# Patient Record
Sex: Female | Born: 1985 | Race: Black or African American | Hispanic: No | Marital: Married | Smoking: Never smoker
Health system: Southern US, Community
[De-identification: ages and names within clinical notes are randomized; demographics above are authoritative.]

## PROBLEM LIST (undated history)

## (undated) ENCOUNTER — Inpatient Hospital Stay (HOSPITAL_COMMUNITY): Payer: Self-pay

## (undated) DIAGNOSIS — Z8759 Personal history of other complications of pregnancy, childbirth and the puerperium: Secondary | ICD-10-CM

## (undated) DIAGNOSIS — O139 Gestational [pregnancy-induced] hypertension without significant proteinuria, unspecified trimester: Secondary | ICD-10-CM

## (undated) DIAGNOSIS — B977 Papillomavirus as the cause of diseases classified elsewhere: Secondary | ICD-10-CM

## (undated) DIAGNOSIS — C801 Malignant (primary) neoplasm, unspecified: Secondary | ICD-10-CM

## (undated) DIAGNOSIS — R1110 Vomiting, unspecified: Secondary | ICD-10-CM

## (undated) DIAGNOSIS — IMO0002 Reserved for concepts with insufficient information to code with codable children: Secondary | ICD-10-CM

## (undated) DIAGNOSIS — K117 Disturbances of salivary secretion: Secondary | ICD-10-CM

## (undated) DIAGNOSIS — R87629 Unspecified abnormal cytological findings in specimens from vagina: Secondary | ICD-10-CM

## (undated) DIAGNOSIS — E876 Hypokalemia: Secondary | ICD-10-CM

## (undated) DIAGNOSIS — A749 Chlamydial infection, unspecified: Secondary | ICD-10-CM

## (undated) HISTORY — DX: Disturbances of salivary secretion: K11.7

## (undated) HISTORY — DX: Vomiting, unspecified: R11.10

## (undated) HISTORY — DX: Hypokalemia: E87.6

## (undated) HISTORY — PX: WISDOM TOOTH EXTRACTION: SHX21

## (undated) HISTORY — DX: Malignant (primary) neoplasm, unspecified: C80.1

## (undated) HISTORY — DX: Reserved for concepts with insufficient information to code with codable children: IMO0002

## (undated) HISTORY — PX: COLPOSCOPY: SHX161

## (undated) HISTORY — DX: Personal history of other complications of pregnancy, childbirth and the puerperium: Z87.59

---
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
## 2008-02-13 ENCOUNTER — Emergency Department (HOSPITAL_COMMUNITY): Admission: EM | Admit: 2008-02-13 | Discharge: 2008-02-13 | Payer: Self-pay | Admitting: Emergency Medicine

## 2009-03-06 ENCOUNTER — Emergency Department (HOSPITAL_COMMUNITY): Admission: EM | Admit: 2009-03-06 | Discharge: 2009-03-07 | Payer: Self-pay | Admitting: Emergency Medicine
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
## 2009-12-20 ENCOUNTER — Inpatient Hospital Stay (HOSPITAL_COMMUNITY)
Admission: EM | Admit: 2009-12-20 | Discharge: 2009-12-20 | Payer: Self-pay | Source: Home / Self Care | Admitting: Obstetrics & Gynecology

## 2009-12-20 IMAGING — US US OB COMP LESS 14 WK
1 series · 13 of 28 positions shown · non-contrast
Comparison: none

[Series 1: us ob comp less 14 wks · 0.27mm/px · 41 acquisitions, 13 frames shown]
[im 2/41]
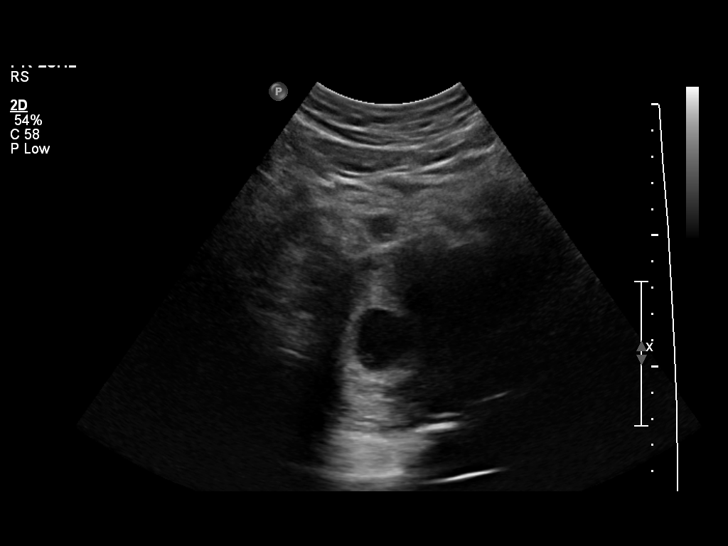
[im 5/41]
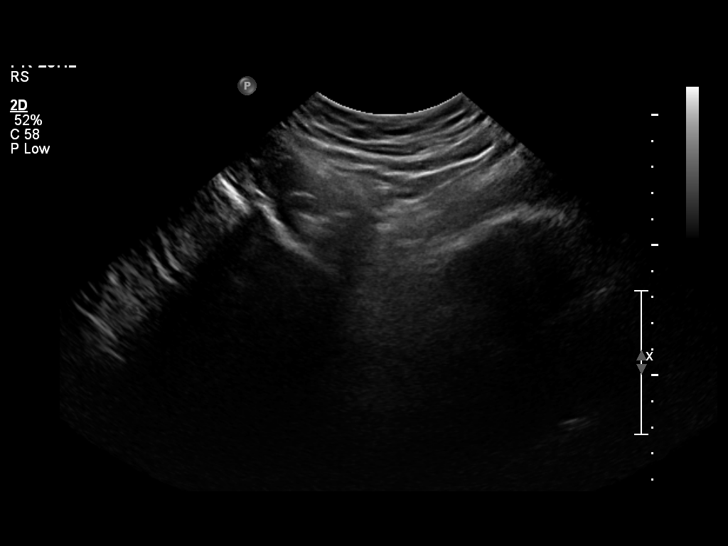
[im 8/41]
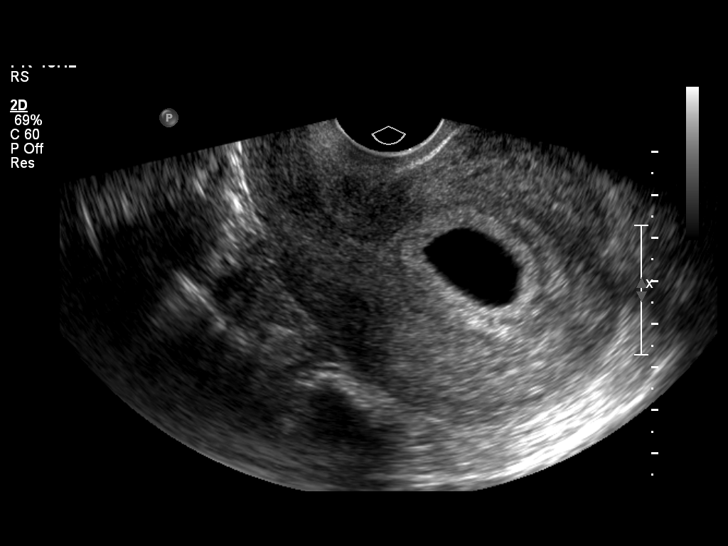
[im 11/41]
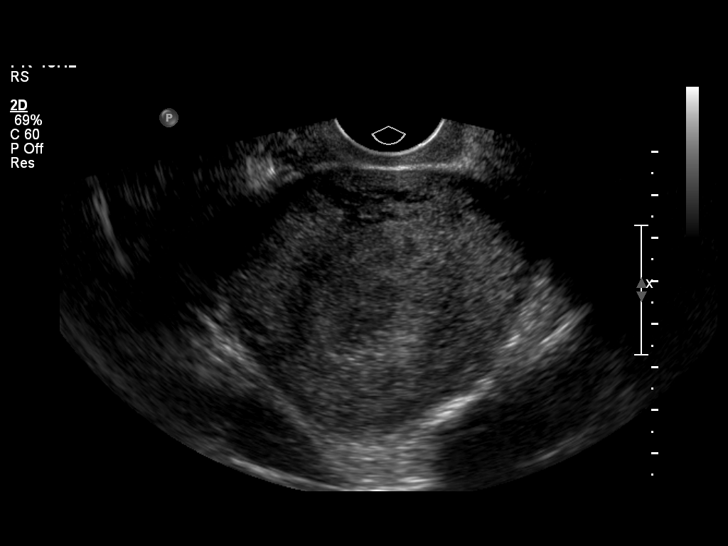
[im 14/41]
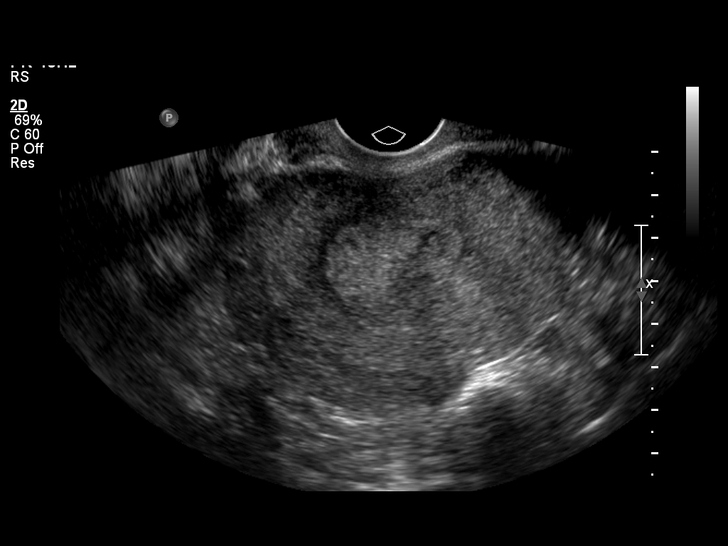
[im 17/41]
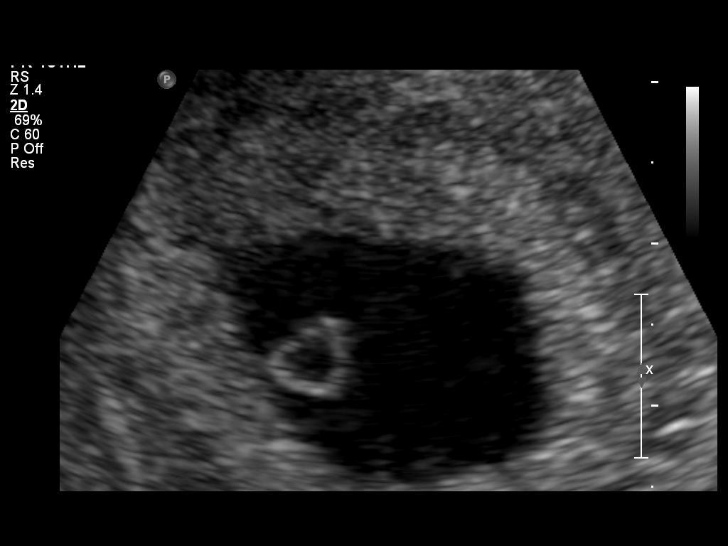
[im 21/41]
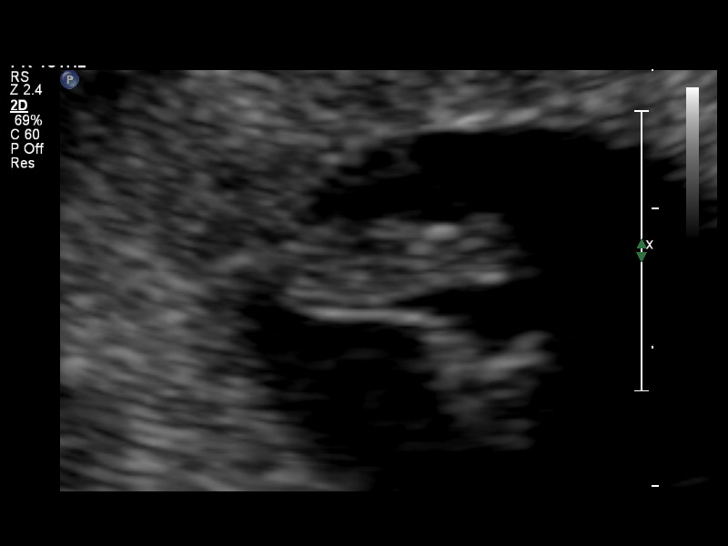
[im 24/41]
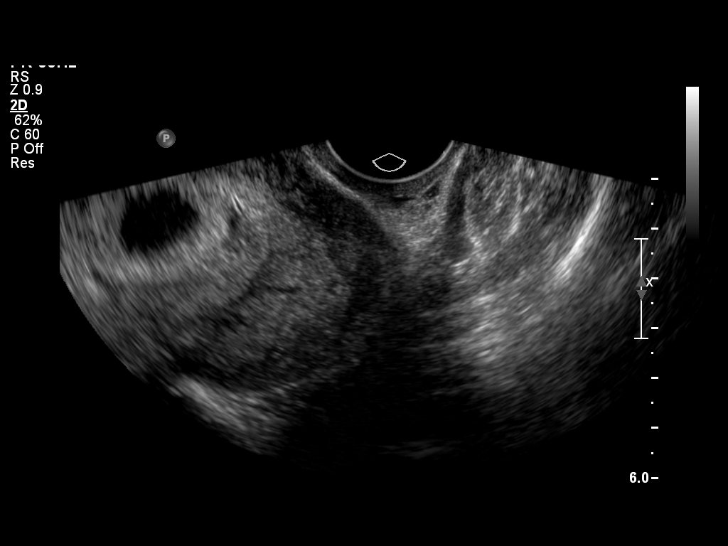
[im 27/41]
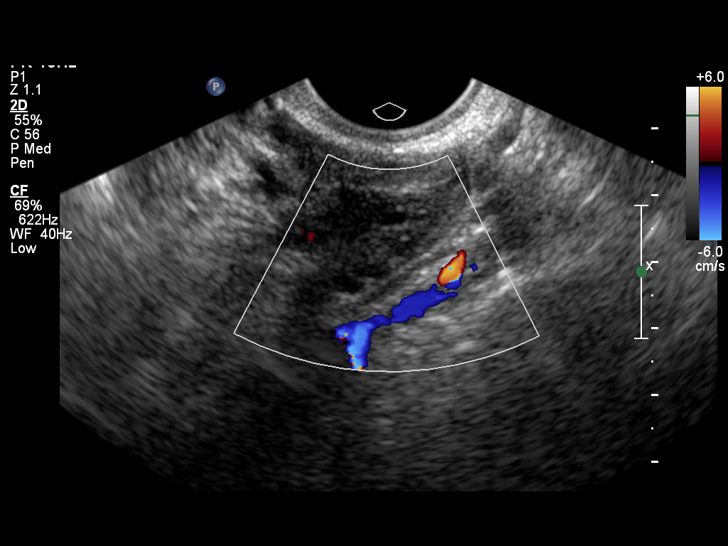
[im 30/41]
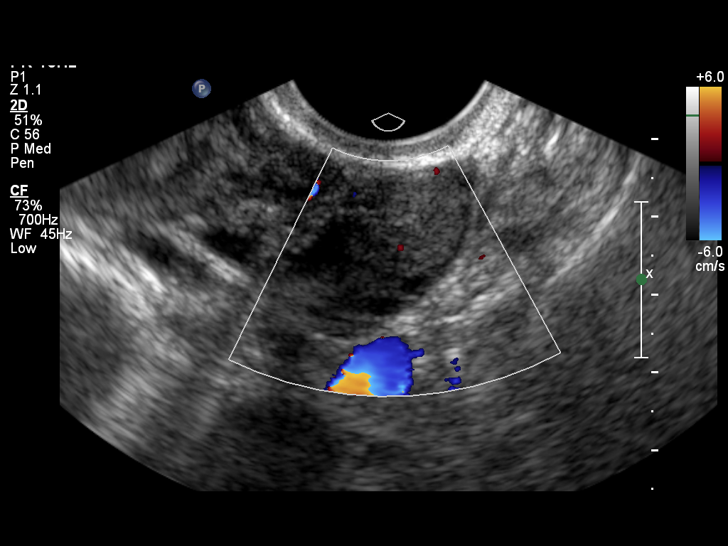
[im 33/41]
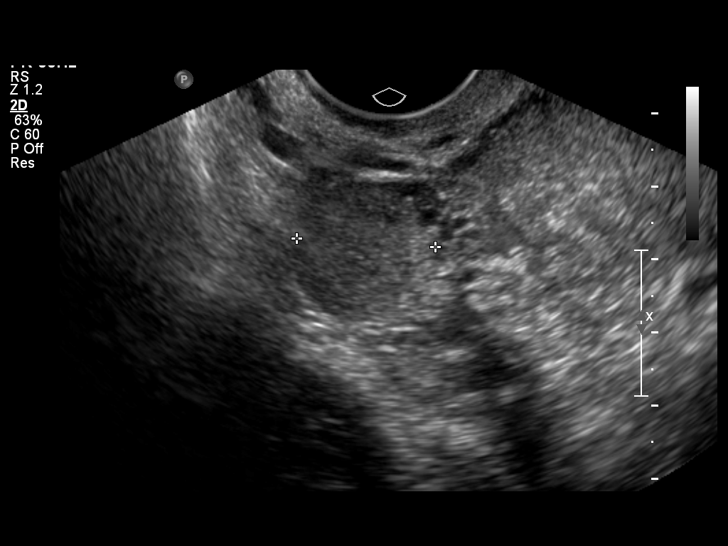
[im 36/41]
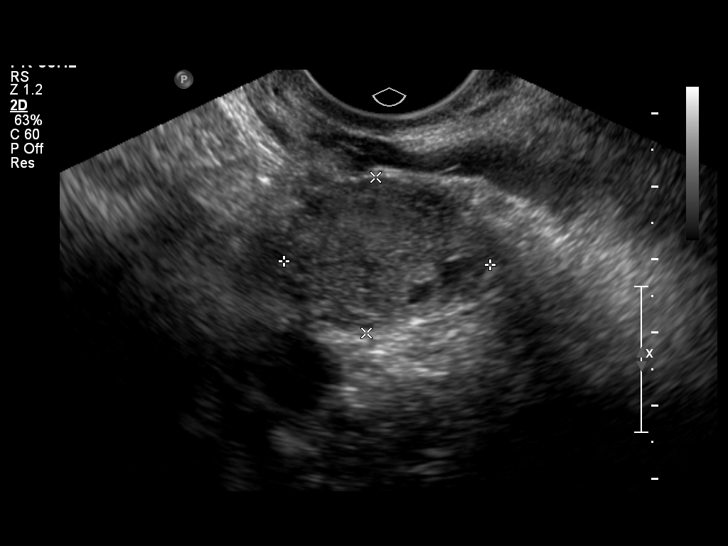
[im 39/41]
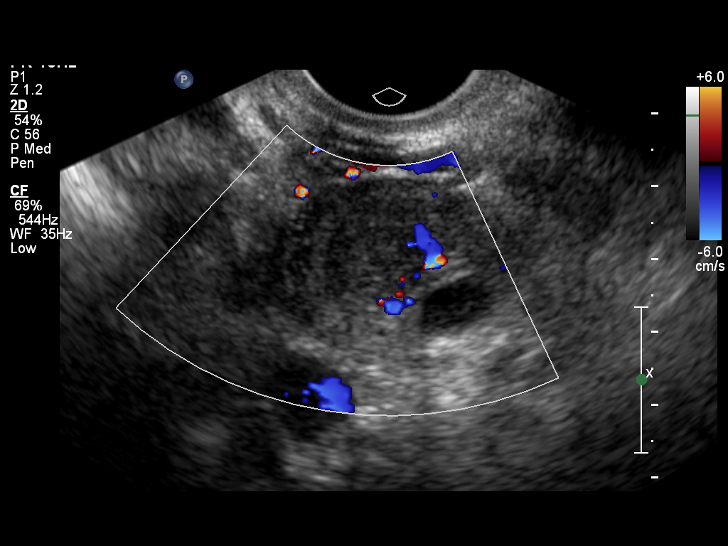

[13 of 28 positions shown; findings below may reference images not displayed]

OBSTETRICS REPORT
                      (Signed Final [DATE] [DATE])

 Order#:         [PHONE_NUMBER][id number]_[email or web address],[P9]
                 [P9]_E
Procedures

 US OB COMP LESS 14 WKS                                76801.0
 US OB TRANSVAGINAL MODIFY                             76817.1
Indications

 Hyperemesis gravidarum
Fetal Evaluation

 Preg. Location:    Intrauterine
 Gest. Sac:         Intrauterine
 [place] Sac:          Visualized
 Fetal Pole:        Visualized
 Fetal Heart Rate:  131                          bpm
 Cardiac Activity:  Observed

 Comment:    Small subchorionic hemorrhage noted.
Biometry

 CRL:      9.4  mm     G. Age:  7w 0d                  EDD:    [DATE]
Gestational Age

 LMP:           6w 4d         Date:  [DATE]                 EDD:   [DATE]
 Best:          6w 4d      Det. By:  LMP  ([DATE])          EDD:   [DATE]
Cervix Uterus Adnexa

 Cervix:       Normal appearance by transvaginal scan
 Uterus:       Retroverted.
 Cul De Sac:   No free fluid seen.
 Left Ovary:    Within normal limits.
 Right Ovary:   Within normal limits. Small corpus luteum noted.
 Adnexa:     No abnormality visualized.
Impression

 Single living IUP.  US EGA is concordant with LMP.
 Small subchorionic hemorrhage noted.
 No adnexal mass or free fluid identified.
 questions or concerns.

## 2009-12-23 ENCOUNTER — Inpatient Hospital Stay (HOSPITAL_COMMUNITY): Admission: AD | Admit: 2009-12-23 | Discharge: 2009-12-23 | Payer: Self-pay | Admitting: Obstetrics and Gynecology

## 2009-12-28 ENCOUNTER — Inpatient Hospital Stay (HOSPITAL_COMMUNITY)
Admission: AD | Admit: 2009-12-28 | Discharge: 2010-01-04 | Payer: Self-pay | Source: Home / Self Care | Attending: Family Medicine | Admitting: Family Medicine

## 2009-12-29 IMAGING — US US ABDOMEN COMPLETE
1 series · 14 of 25 positions shown · non-contrast
Comparison: None.

CLINICAL DATA: 7 weeks pregnant, hyper emesis, abnormal liver
function tests

ABDOMINAL ULTRASOUND COMPLETE

[Series 1: us abdomen complete · 14 of 79 slices shown]
[im 1/79]
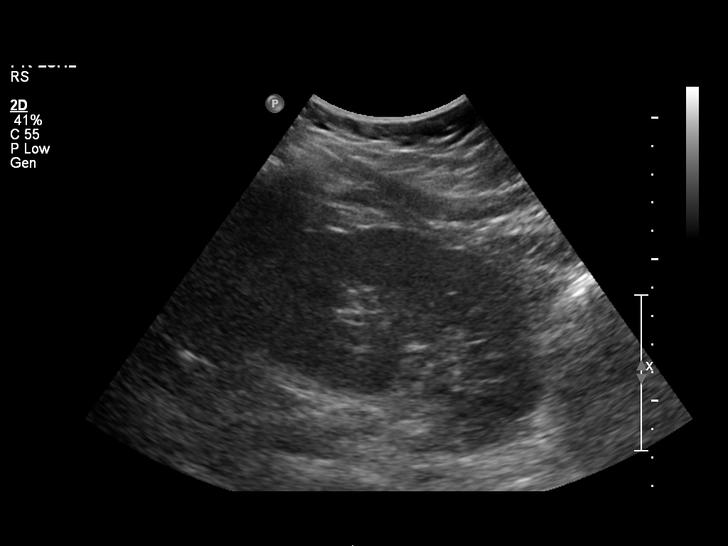
[im 7/79]
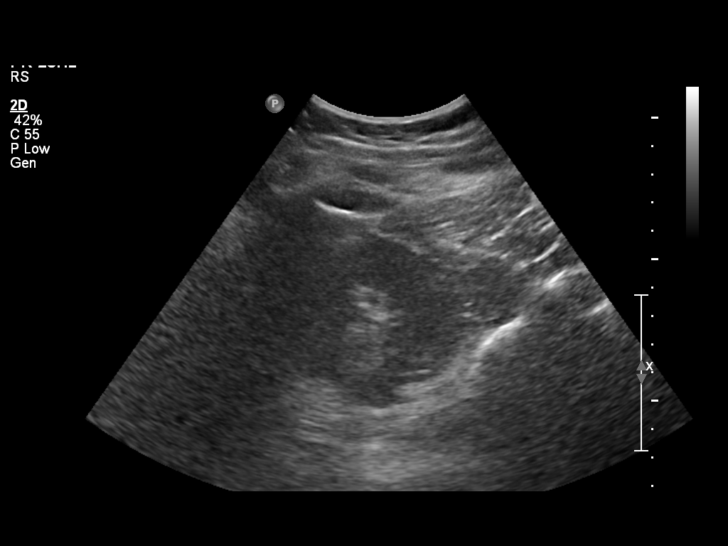
[im 14/79]
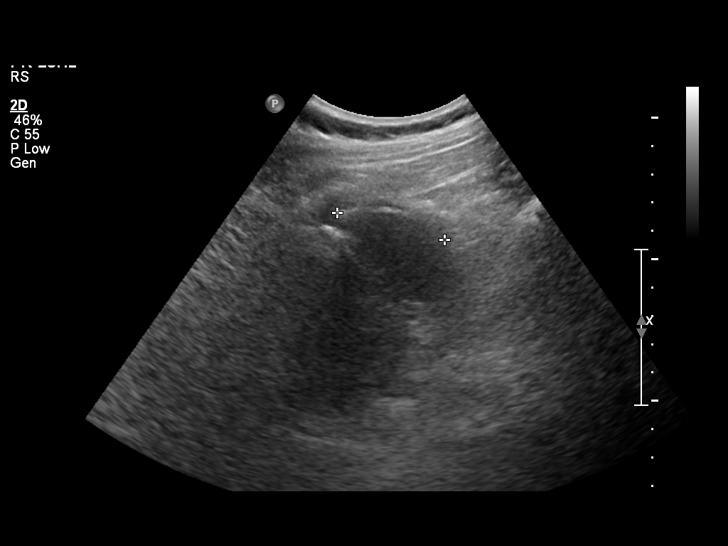
[im 20/79]
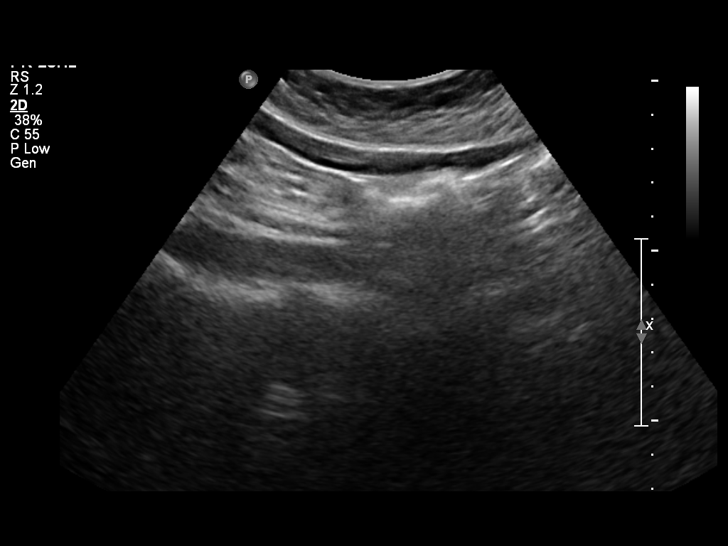
[im 27/79]
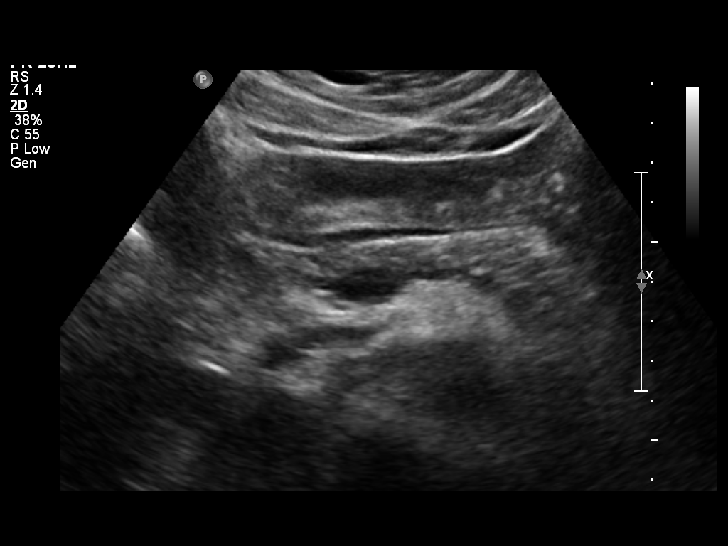
[im 30/79]
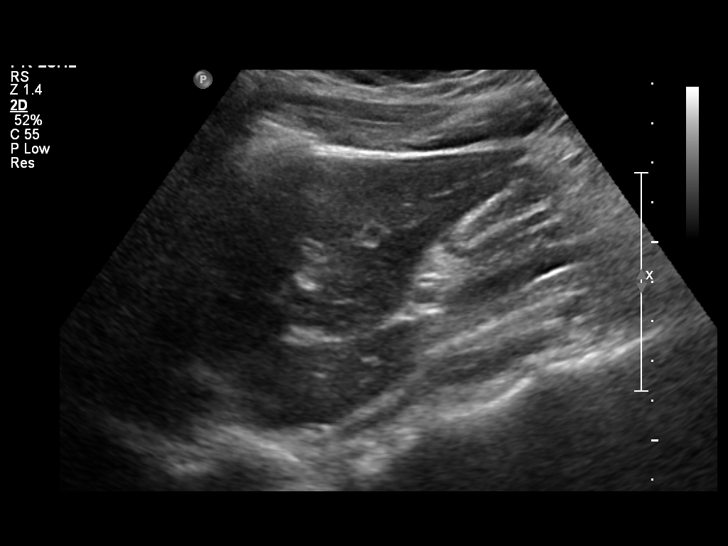
[im 36/79]
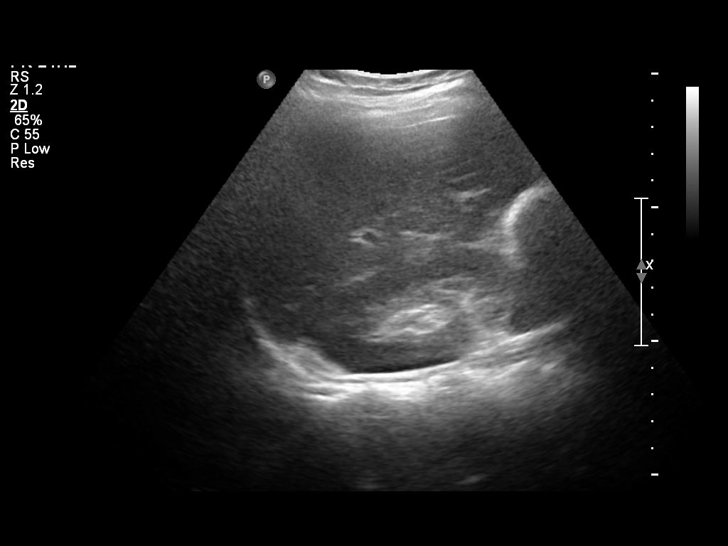
[im 43/79]
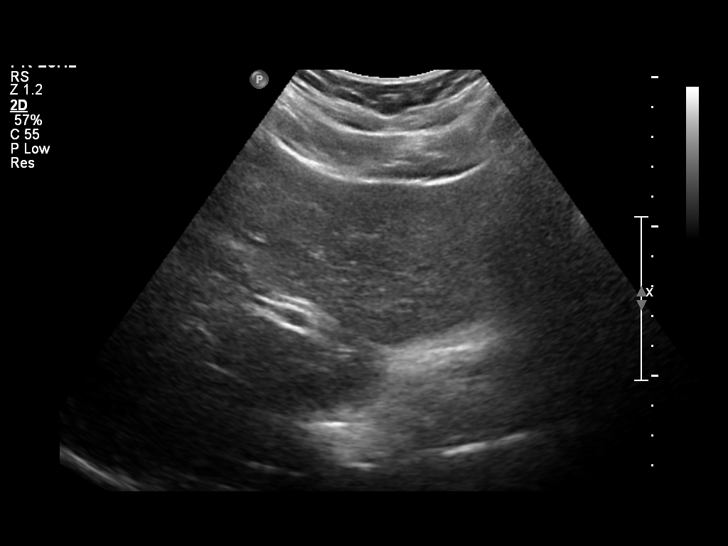
[im 49/79]
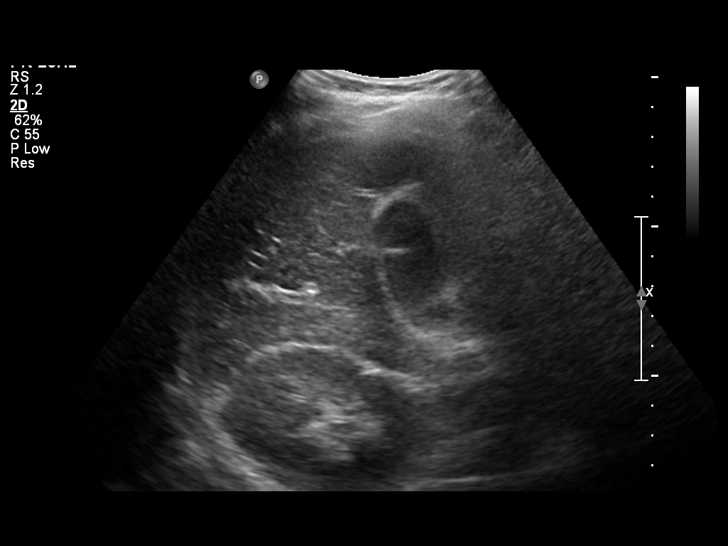
[im 53/79]
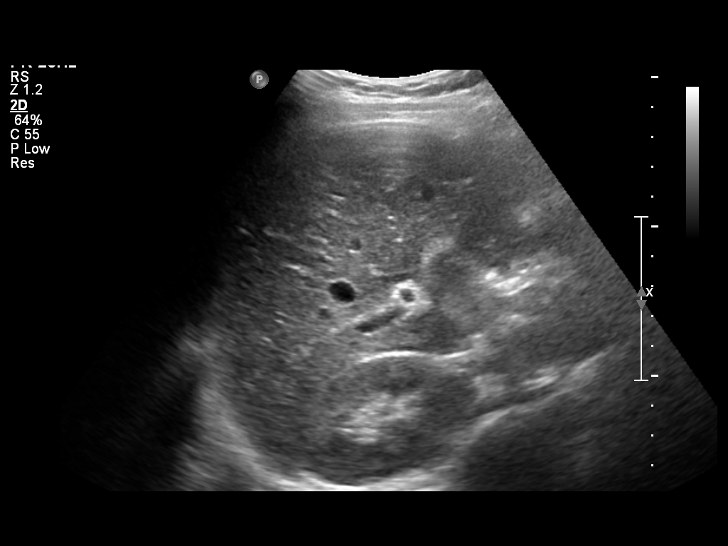
[im 59/79]
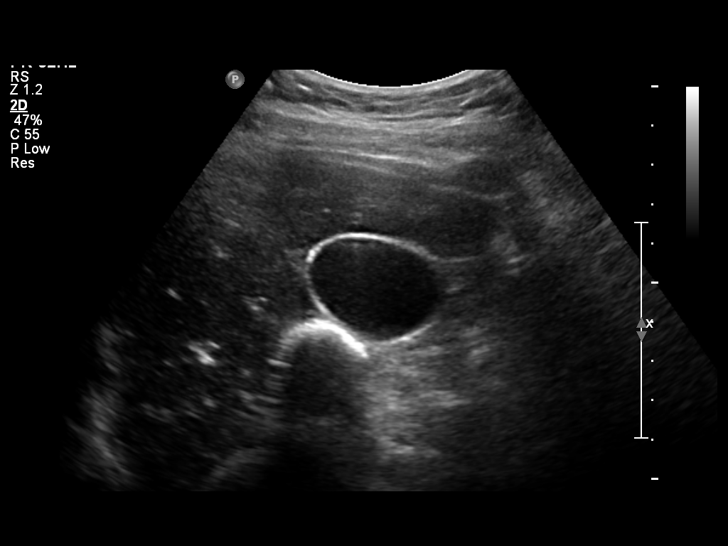
[im 66/79]
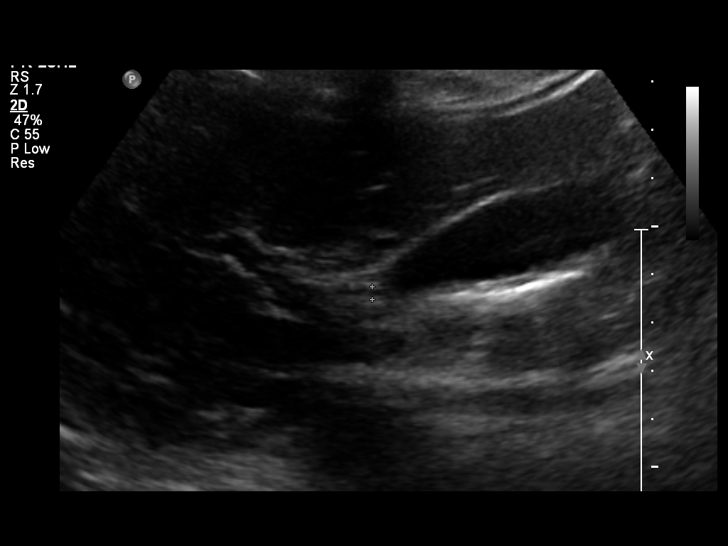
[im 72/79]
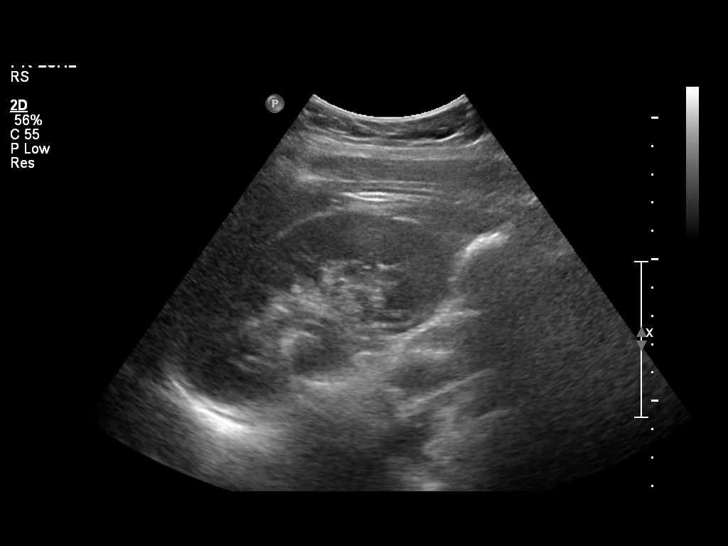
[im 79/79]
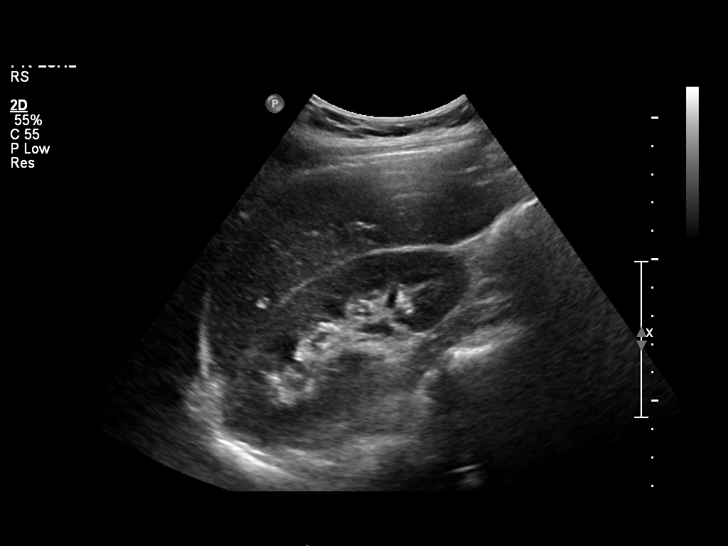

[14 of 25 positions shown; findings below may reference images not displayed]

FINDINGS: Gallbladder:  No gallstones, gallbladder wall thickening, or
pericholecystic fluid.

Common Bile Duct:  Within normal limits in caliber.

Liver: No focal mass lesion identified.  Within normal limits in
parenchymal echogenicity.

IVC:  Appears normal.

Pancreas:  No abnormality identified.

Spleen:  Within normal limits in size and echotexture.

Right kidney:  Normal in size and parenchymal echogenicity.  No
evidence of mass or hydronephrosis.

Left kidney:  Normal in size and parenchymal echogenicity.  No
evidence of mass or hydronephrosis.

Abdominal Aorta:  No aneurysm identified.
IMPRESSION: Negative abdominal ultrasound.

## 2010-01-23 DIAGNOSIS — IMO0002 Reserved for concepts with insufficient information to code with codable children: Secondary | ICD-10-CM

## 2010-01-23 DIAGNOSIS — R87619 Unspecified abnormal cytological findings in specimens from cervix uteri: Secondary | ICD-10-CM

## 2010-01-23 HISTORY — DX: Unspecified abnormal cytological findings in specimens from cervix uteri: R87.619

## 2010-01-23 HISTORY — DX: Reserved for concepts with insufficient information to code with codable children: IMO0002

## 2010-01-28 ENCOUNTER — Inpatient Hospital Stay (HOSPITAL_COMMUNITY)
Admission: AD | Admit: 2010-01-28 | Discharge: 2010-02-07 | Payer: Self-pay | Source: Home / Self Care | Attending: Family Medicine | Admitting: Family Medicine

## 2010-02-07 LAB — COMPREHENSIVE METABOLIC PANEL
ALT: 56 U/L — ABNORMAL HIGH (ref 0–35)
ALT: 52 U/L — ABNORMAL HIGH (ref 0–35)
ALT: 58 U/L — ABNORMAL HIGH (ref 0–35)
AST: 35 U/L (ref 0–37)
AST: 33 U/L (ref 0–37)
AST: 32 U/L (ref 0–37)
Albumin: 3.1 g/dL — ABNORMAL LOW (ref 3.5–5.2)
Albumin: 2.9 g/dL — ABNORMAL LOW (ref 3.5–5.2)
Albumin: 3 g/dL — ABNORMAL LOW (ref 3.5–5.2)
Alkaline Phosphatase: 95 U/L (ref 39–117)
Alkaline Phosphatase: 91 U/L (ref 39–117)
Alkaline Phosphatase: 93 U/L (ref 39–117)
BUN: 1 mg/dL — ABNORMAL LOW (ref 6–23)
BUN: 1 mg/dL — ABNORMAL LOW (ref 6–23)
BUN: <1 mg/dL — ABNORMAL LOW (ref 6–23)
CO2: 26 mEq/L (ref 19–32)
CO2: 25 mEq/L (ref 19–32)
CO2: 25 mEq/L (ref 19–32)
Calcium: 9.2 mg/dL (ref 8.4–10.5)
Calcium: 8.9 mg/dL (ref 8.4–10.5)
Calcium: 9.1 mg/dL (ref 8.4–10.5)
Chloride: 100 mEq/L (ref 96–112)
Chloride: 102 mEq/L (ref 96–112)
Chloride: 102 mEq/L (ref 96–112)
Creatinine, Ser: 0.58 mg/dL (ref 0.4–1.2)
Creatinine, Ser: 0.53 mg/dL (ref 0.4–1.2)
Creatinine, Ser: 0.47 mg/dL (ref 0.4–1.2)
GFR calc Af Amer: >60 mL/min (ref >60)
GFR calc Af Amer: >60 mL/min (ref >60)
GFR calc Af Amer: >60 mL/min (ref >60)
GFR calc non Af Amer: >60 mL/min (ref >60)
GFR calc non Af Amer: >60 mL/min (ref >60)
GFR calc non Af Amer: >60 mL/min (ref >60)
Glucose, Bld: 107 mg/dL — ABNORMAL HIGH (ref 70–99)
Glucose, Bld: 94 mg/dL (ref 70–99)
Glucose, Bld: 86 mg/dL (ref 70–99)
Potassium: 2.9 mEq/L — ABNORMAL LOW (ref 3.5–5.1)
Potassium: 2.9 mEq/L — ABNORMAL LOW (ref 3.5–5.1)
Potassium: 3.7 mEq/L (ref 3.5–5.1)
Sodium: 134 mEq/L — ABNORMAL LOW (ref 135–145)
Sodium: 134 mEq/L — ABNORMAL LOW (ref 135–145)
Sodium: 133 mEq/L — ABNORMAL LOW (ref 135–145)
Total Bilirubin: 1.5 mg/dL — ABNORMAL HIGH (ref 0.3–1.2)
Total Bilirubin: 1.5 mg/dL — ABNORMAL HIGH (ref 0.3–1.2)
Total Bilirubin: 1.1 mg/dL (ref 0.3–1.2)
Total Protein: 6.4 g/dL (ref 6.0–8.3)
Total Protein: 6 g/dL (ref 6.0–8.3)
Total Protein: 6.2 g/dL (ref 6.0–8.3)

## 2010-02-07 LAB — URINE CULTURE
Colony Count: NO GROWTH
Culture  Setup Time: 201201072033
Culture: NO GROWTH
Special Requests: NEGATIVE

## 2010-02-07 LAB — TSH: TSH: 0.652 u[IU]/mL (ref 0.350–4.500)

## 2010-02-07 LAB — CBC
HCT: 32.4 % — ABNORMAL LOW (ref 36.0–46.0)
HCT: 32.2 % — ABNORMAL LOW (ref 36.0–46.0)
Hemoglobin: 11 g/dL — ABNORMAL LOW (ref 12.0–15.0)
Hemoglobin: 10.8 g/dL — ABNORMAL LOW (ref 12.0–15.0)
MCH: 25.1 pg — ABNORMAL LOW (ref 26.0–34.0)
MCH: 25.1 pg — ABNORMAL LOW (ref 26.0–34.0)
MCHC: 34 g/dL (ref 30.0–36.0)
MCHC: 33.5 g/dL (ref 30.0–36.0)
MCV: 74 fL — ABNORMAL LOW (ref 78.0–100.0)
MCV: 74.9 fL — ABNORMAL LOW (ref 78.0–100.0)
Platelets: 326 10*3/uL (ref 150–400)
Platelets: 342 10*3/uL (ref 150–400)
RBC: 4.38 MIL/uL (ref 3.87–5.11)
RBC: 4.3 MIL/uL (ref 3.87–5.11)
RDW: 16.7 % — ABNORMAL HIGH (ref 11.5–15.5)
RDW: 16.8 % — ABNORMAL HIGH (ref 11.5–15.5)
WBC: 7.7 10*3/uL (ref 4.0–10.5)
WBC: 8.3 10*3/uL (ref 4.0–10.5)

## 2010-02-07 LAB — GLUCOSE, CAPILLARY
Glucose-Capillary: 120 mg/dL — ABNORMAL HIGH (ref 70–99)
Glucose-Capillary: 116 mg/dL — ABNORMAL HIGH (ref 70–99)
Glucose-Capillary: 108 mg/dL — ABNORMAL HIGH (ref 70–99)
Glucose-Capillary: 137 mg/dL — ABNORMAL HIGH (ref 70–99)
Glucose-Capillary: 125 mg/dL — ABNORMAL HIGH (ref 70–99)
Glucose-Capillary: 123 mg/dL — ABNORMAL HIGH (ref 70–99)
Glucose-Capillary: 106 mg/dL — ABNORMAL HIGH (ref 70–99)
Glucose-Capillary: 110 mg/dL — ABNORMAL HIGH (ref 70–99)
Glucose-Capillary: 101 mg/dL — ABNORMAL HIGH (ref 70–99)

## 2010-02-07 LAB — LIPASE, BLOOD
Lipase: 25 U/L (ref 11–59)
Lipase: 29 U/L (ref 11–59)

## 2010-02-07 LAB — URINALYSIS, ROUTINE W REFLEX MICROSCOPIC
Hgb urine dipstick: NEGATIVE
Ketones, ur: >80 mg/dL — AB
Leukocytes, UA: NEGATIVE
Nitrite: NEGATIVE
Protein, ur: 100 mg/dL — AB
Specific Gravity, Urine: >1.03 — ABNORMAL HIGH (ref 1.005–1.030)
Urine Glucose, Fasting: 250 mg/dL — AB
Urobilinogen, UA: 4 mg/dL — ABNORMAL HIGH (ref 0.0–1.0)
pH: 6.5 (ref 5.0–8.0)

## 2010-02-07 LAB — URINE MICROSCOPIC-ADD ON

## 2010-02-07 LAB — AMYLASE
Amylase: 78 U/L (ref 0–105)
Amylase: 76 U/L (ref 0–105)

## 2010-02-07 LAB — HEMOGLOBIN A1C
Hgb A1c MFr Bld: 5.4 % (ref <5.7)
Mean Plasma Glucose: 108 mg/dL (ref <117)

## 2010-02-09 LAB — GLUCOSE, CAPILLARY: Glucose-Capillary: 107 mg/dL — ABNORMAL HIGH (ref 70–99)

## 2010-02-10 ENCOUNTER — Ambulatory Visit
Admission: RE | Admit: 2010-02-10 | Discharge: 2010-02-10 | Payer: Self-pay | Source: Home / Self Care | Attending: Family Medicine | Admitting: Family Medicine

## 2010-02-10 ENCOUNTER — Encounter: Payer: Self-pay | Admitting: Obstetrics & Gynecology

## 2010-02-10 ENCOUNTER — Other Ambulatory Visit: Payer: Self-pay | Admitting: Obstetrics & Gynecology

## 2010-02-10 LAB — CONVERTED CEMR LAB
Antibody Screen: NEGATIVE
Basophils Absolute: 0 10*3/uL (ref 0.0–0.1)
Basophils Relative: 0 % (ref 0–1)
Eosinophils Relative: 0 % (ref 0–5)
HCT: 36.9 % (ref 36.0–46.0)
HIV: NONREACTIVE
Hemoglobin: 11.8 g/dL — ABNORMAL LOW (ref 12.0–15.0)
Hgb A: 97.6 % (ref 96.8–97.8)
Hgb F Quant: 0 % (ref 0.0–2.0)
Hgb S Quant: 0 % (ref 0.0–0.0)
MCHC: 32 g/dL (ref 30.0–36.0)
MCV: 77.8 fL — ABNORMAL LOW (ref 78.0–100.0)
Monocytes Absolute: 1.2 10*3/uL — ABNORMAL HIGH (ref 0.1–1.0)
Monocytes Relative: 9 % (ref 3–12)
RBC: 4.74 M/uL (ref 3.87–5.11)
RDW: 17.4 % — ABNORMAL HIGH (ref 11.5–15.5)

## 2010-02-11 ENCOUNTER — Ambulatory Visit
Admission: RE | Admit: 2010-02-11 | Payer: Self-pay | Source: Home / Self Care | Attending: Obstetrics and Gynecology | Admitting: Obstetrics and Gynecology

## 2010-02-11 ENCOUNTER — Encounter: Payer: Self-pay | Admitting: Obstetrics & Gynecology

## 2010-02-11 LAB — CONVERTED CEMR LAB
ALT: 31 units/L (ref 0–35)
AST: 18 units/L (ref 0–37)
Alkaline Phosphatase: 98 units/L (ref 39–117)
Chloride: 93 meq/L — ABNORMAL LOW (ref 96–112)
Creatinine, Ser: 0.5 mg/dL (ref 0.40–1.20)
Total Bilirubin: 0.5 mg/dL (ref 0.3–1.2)

## 2010-02-12 ENCOUNTER — Inpatient Hospital Stay (HOSPITAL_COMMUNITY)
Admission: AD | Admit: 2010-02-12 | Discharge: 2010-02-12 | Payer: Self-pay | Source: Home / Self Care | Attending: Obstetrics & Gynecology | Admitting: Obstetrics & Gynecology

## 2010-02-12 DIAGNOSIS — O210 Mild hyperemesis gravidarum: Secondary | ICD-10-CM

## 2010-02-14 ENCOUNTER — Inpatient Hospital Stay (HOSPITAL_COMMUNITY)
Admission: AD | Admit: 2010-02-14 | Discharge: 2010-02-22 | Disposition: A | Discharge status: Still In Hospital | Payer: Self-pay | Source: Home / Self Care | Attending: Obstetrics & Gynecology | Admitting: Obstetrics & Gynecology

## 2010-02-14 LAB — POCT URINALYSIS DIPSTICK
Hgb urine dipstick: NEGATIVE
Nitrite: NEGATIVE
Protein, ur: 100 mg/dL — AB
Specific Gravity, Urine: 1.025 (ref 1.005–1.030)
Urobilinogen, UA: 1 mg/dL (ref 0.0–1.0)
pH: 6 (ref 5.0–8.0)

## 2010-02-14 NOTE — Consult Note (Addendum)
  NAME:  Evelyn Marshall, Evelyn Marshall              ACCOUNT NO.:  [id number]  MEDICAL RECORD NO.:  [id number]         PATIENT TYPE:  WMAT  LOCATION:                                FACILITY:  [facility]  PHYSICIAN:  Horton Chin, MD DATE OF BIRTH:  [birth year]  DATE OF CONSULTATION:  02/11/2010 DATE OF DISCHARGE:                                CONSULTATION   REASON FOR DOCUMENTATION:  Critical lab values reported by [facility] of glucose level of 37.  HISTORY OF PRESENT ILLNESS:  The patient is at 25 year old gravida 2, para 1 who is being followed by our service for hyperemesis.  She is currently at 32 weeks pregnancy.  The patient was seen in the [facility] yesterday and had some laboratory evaluation done.  One of the labs came back with a critical value of a glucose of 37, and that report was called to me today as the attending on-call.  The patient was immediately called and she is doing okay and has no mental status changes.  She is still having some nausea and vomiting, but is able to tolerate liquids okay and some solids.  The patient was told that if she does have any concerning symptoms, that she should come to the MAU for further evaluation and the patient was agreeable to this plan.  At this point, the patient is not critical and again, she was called and a lengthy phone conversation occurred.  Of note, the patient's phone number is [phone].  The phone number that is on her e-chart record of [phone] has been disconnected.  The new number is [phone], and Admitting was informed of this new number so that her records can be updated.     Horton Chin, MD     UAA/MEDQ  D:  02/11/2010  T:  02/12/2010  Job:  [id number]  Electronically Signed by Jaynie Collins MD on 02/14/2010 10:10:49 AM

## 2010-02-14 NOTE — Discharge Summary (Signed)
NAME:  Evelyn Marshall, Evelyn Marshall              ACCOUNT NO.:  [id number]  MEDICAL RECORD NO.:  [id number]          PATIENT TYPE:  INP  LOCATION:  9320                          FACILITY:  [facility]  PHYSICIAN:  Tawonna Esquer S. Shawnie Pons, M.D.   DATE OF BIRTH:  March 22, 1985  DATE OF ADMISSION:  01/28/2010 DATE OF DISCHARGE:  02/07/2010                              DISCHARGE SUMMARY   FINAL DIAGNOSES: 1. Intrauterine pregnancy at 32 weeks. 2. Hyperemesis gravidarum. 3. History of urinary tract infection. 4. History of anemia. 5. Hypokalemia. 6. Dehydration. 7. Malnutrition.  PROCEDURES:  Include IV hydration, potassium repletion, NG tube, Dobhoff placement, tube feeds, antiemetics.  Pertinent labs include CBC and with hemoglobin of 10.8, normal white count, and normal platelet count.  Chemistries that reveal low potassium at 2.9 which was repleted up to 3.7, normal creatinine, normal LFTs, several mildly elevated T-bili at 1.51, low albumin at 3.1.  Hemoglobin A1c of 5.4.  TSH of 0.65.  Initial urine that showed marked dehydration with a specific gravity of greater than 1.030, moderate bilirubin, greater than 80 ketones, 100 of protein, and hyaline casts.  Urine culture was negative.  Other findings include ill-appearing patient who had an initial weight of 75 kg who has had a flat abdomen continually with throwing up and spitting.  The patient also had a spurious elevated blood pressure with the rest of her blood pressures have been well.  The patient mostly had normal blood pressure.  She did have a couple of days where blood pressure was greater than 140-150 over 90s.  REASON FOR ADMISSION:  Briefly, please see H and P on the chart.  The patient is a 25 year old gravida 2, para 1 who is well known to the service.  She has a history of hyperemesis.  She was admitted for multiple days in early December, and had gone home on a Medrol Dosepak and had seemed to be better, she finished the Dosepak approximately  10 days prior to presenting for readmission and had began throwing up and spitting at that time.  She was admitted because of dehydration and malnutrition for IV hydration, electrolyte repletion, and management of hyperemesis.  HOSPITAL COURSE:  The patient was admitted.  She was restarted on a Medrol Dosepak on hospital day #1.  She was also put on scheduled Zofran, Phenergan, Reglan, and Protonix.  She was then started on Robinul IV and scopolamine patch.  She gained 5 kg on the first day. Her potassium was repleted.  She was then written for a Zofran pump, however, Zofran pump could not be started as an inpatient and the patient continued to have problems with nausea and vomiting.  On hospital day #4, Dobhoff feeding tube was placed and tube feeds were started. Initially, the patient had a little bit of nausea related to this, but then that seemed to get better.  She was then started on Protonix as well and all of her IV meds were changed to p.o.  The patient was able tolerate fairly regular diet over and above her tube feeds, but then had discussed stopping her tube feeds and going home without a Dobhoff.  This was done on hospital day #7 and #8, however, she became fairly nauseous after tube feeds had been off for 24 hours and she was unwilling to go home with that feeling, and thought she might bounce-back rather quickly so tube feeds were resumed on hospital day #9 with the plan for her to go home with home health and tube feeds to continue for some time postdischarge.  The patient had some mild nausea on hospital day #10, but was tolerating tube feeds without throwing up.  It was felt she was stable for discharge.  DISCHARGE DISPOSITION AND CONDITION:  The patient discharged home in improved condition.  Follow up will be in the High Risk clinic on Thursday of this week.  Home health services are obtained to help with tube feeds at home to show her how to work the  equipment.  Discharge medications include: 1. Zofran 8 mg every 4-6 hours as needed. 2. Robinul 1 mg by mouth 2-3 times daily as needed. 3. Protonix 40 mg by mouth daily. 4. Phenergan rectal suppository 25 mg, she may insert vaginally every     4 hours as needed for nausea. 5. Reglan 10 mg by mouth with meals and at bedtime. 6. Colace 5 mg daily. 7. Medrol taper to finish out the present taper that was started in     the hospital. 8. Scopolamine patch replaced every 3 days.  The patient's tube feeds will be set a rate of 35 mL/hour, and anything she eats above and beyond that, consider repletion for her to promote fast repletion with a moderate degree of malnutrition for Nutrition consult.     Shelbie Proctor. Shawnie Pons, M.D.     TSP/MEDQ  D:  02/07/2010  T:  02/07/2010  Job:  [id number]  Electronically Signed by Tinnie Gens M.D. on 02/14/2010 12:06:54 PM

## 2010-02-15 LAB — URINE MICROSCOPIC-ADD ON

## 2010-02-15 LAB — COMPREHENSIVE METABOLIC PANEL
BUN: 5 mg/dL — ABNORMAL LOW (ref 6–23)
Calcium: 9.6 mg/dL (ref 8.4–10.5)
Glucose, Bld: 106 mg/dL — ABNORMAL HIGH (ref 70–99)
Total Protein: 7.6 g/dL (ref 6.0–8.3)

## 2010-02-15 LAB — BASIC METABOLIC PANEL
CO2: 27 mEq/L (ref 19–32)
Calcium: 9.4 mg/dL (ref 8.4–10.5)
Chloride: 100 mEq/L (ref 96–112)
GFR calc Af Amer: >60 mL/min (ref >60)
Potassium: 3.7 mEq/L (ref 3.5–5.1)
Sodium: 134 mEq/L — ABNORMAL LOW (ref 135–145)

## 2010-02-15 LAB — URINALYSIS, ROUTINE W REFLEX MICROSCOPIC
Bilirubin Urine: NEGATIVE
Hgb urine dipstick: NEGATIVE
Ketones, ur: 15 mg/dL — AB
Ketones, ur: >80 mg/dL — AB
Leukocytes, UA: NEGATIVE
Protein, ur: 30 mg/dL — AB
Urine Glucose, Fasting: NEGATIVE mg/dL
Urine Glucose, Fasting: NEGATIVE mg/dL
pH: 6 (ref 5.0–8.0)

## 2010-02-15 LAB — CBC
HCT: 35.2 % — ABNORMAL LOW (ref 36.0–46.0)
Hemoglobin: 11.8 g/dL — ABNORMAL LOW (ref 12.0–15.0)
MCH: 25.2 pg — ABNORMAL LOW (ref 26.0–34.0)
RBC: 4.68 MIL/uL (ref 3.87–5.11)

## 2010-02-16 DIAGNOSIS — O10019 Pre-existing essential hypertension complicating pregnancy, unspecified trimester: Secondary | ICD-10-CM

## 2010-02-16 DIAGNOSIS — E876 Hypokalemia: Secondary | ICD-10-CM

## 2010-02-16 DIAGNOSIS — K5900 Constipation, unspecified: Secondary | ICD-10-CM

## 2010-02-16 DIAGNOSIS — O210 Mild hyperemesis gravidarum: Secondary | ICD-10-CM

## 2010-02-16 LAB — COMPREHENSIVE METABOLIC PANEL
ALT: 26 U/L (ref 0–35)
AST: 14 U/L (ref 0–37)
CO2: 26 mEq/L (ref 19–32)
Chloride: 104 mEq/L (ref 96–112)
GFR calc Af Amer: >60 mL/min (ref >60)
GFR calc non Af Amer: >60 mL/min (ref >60)
Sodium: 136 mEq/L (ref 135–145)
Total Bilirubin: 0.4 mg/dL (ref 0.3–1.2)

## 2010-02-17 ENCOUNTER — Ambulatory Visit: Admit: 2010-02-17 | Payer: Self-pay | Admitting: Obstetrics & Gynecology

## 2010-02-18 LAB — PROTEIN, URINE, 24 HOUR: Protein, Ur: 155 mg/d — ABNORMAL HIGH (ref 50–100)

## 2010-02-19 LAB — COMPREHENSIVE METABOLIC PANEL
ALT: 35 U/L (ref 0–35)
CO2: 23 mEq/L (ref 19–32)
Calcium: 8.7 mg/dL (ref 8.4–10.5)
Chloride: 103 mEq/L (ref 96–112)
GFR calc non Af Amer: >60 mL/min (ref >60)
Glucose, Bld: 94 mg/dL (ref 70–99)
Sodium: 132 mEq/L — ABNORMAL LOW (ref 135–145)
Total Bilirubin: 0.4 mg/dL (ref 0.3–1.2)

## 2010-02-19 LAB — CBC
HCT: 29.2 % — ABNORMAL LOW (ref 36.0–46.0)
Hemoglobin: 9.7 g/dL — ABNORMAL LOW (ref 12.0–15.0)
MCH: 25.5 pg — ABNORMAL LOW (ref 26.0–34.0)
MCHC: 33.2 g/dL (ref 30.0–36.0)

## 2010-02-19 LAB — MAGNESIUM: Magnesium: 1.5 mg/dL (ref 1.5–2.5)

## 2010-02-22 ENCOUNTER — Inpatient Hospital Stay (HOSPITAL_COMMUNITY)
Admission: AD | Admit: 2010-02-22 | Discharge: 2010-02-22 | Payer: Self-pay | Source: Home / Self Care | Attending: Family Medicine | Admitting: Family Medicine

## 2010-02-23 ENCOUNTER — Inpatient Hospital Stay (HOSPITAL_COMMUNITY): Payer: Medicaid Other

## 2010-02-23 ENCOUNTER — Encounter (HOSPITAL_COMMUNITY): Payer: Self-pay | Admitting: Radiology

## 2010-02-23 ENCOUNTER — Inpatient Hospital Stay (HOSPITAL_COMMUNITY)
Admission: AD | Admit: 2010-02-23 | Discharge: 2010-02-23 | Disposition: A | Discharge status: Home / Self Care | Payer: Medicaid Other | Source: Ambulatory Visit | Attending: Obstetrics & Gynecology | Admitting: Obstetrics & Gynecology

## 2010-02-23 DIAGNOSIS — O210 Mild hyperemesis gravidarum: Secondary | ICD-10-CM | POA: Insufficient documentation

## 2010-02-23 LAB — URINALYSIS, ROUTINE W REFLEX MICROSCOPIC
Bilirubin Urine: NEGATIVE
Hgb urine dipstick: NEGATIVE
Ketones, ur: >80 mg/dL — AB
Nitrite: NEGATIVE
Specific Gravity, Urine: 1.015 (ref 1.005–1.030)
Urobilinogen, UA: 1 mg/dL (ref 0.0–1.0)
pH: 7.5 (ref 5.0–8.0)

## 2010-02-23 IMAGING — CR DG CHEST 1V
1 series · 1 of 1 positions shown · non-contrast
Comparison: [LU] hours the same day.

CLINICAL DATA: 24-year-old female with nausea.  Feeding tube
advanced.  The patient is pregnant.

CHEST - 1 VIEW

[view not recorded]
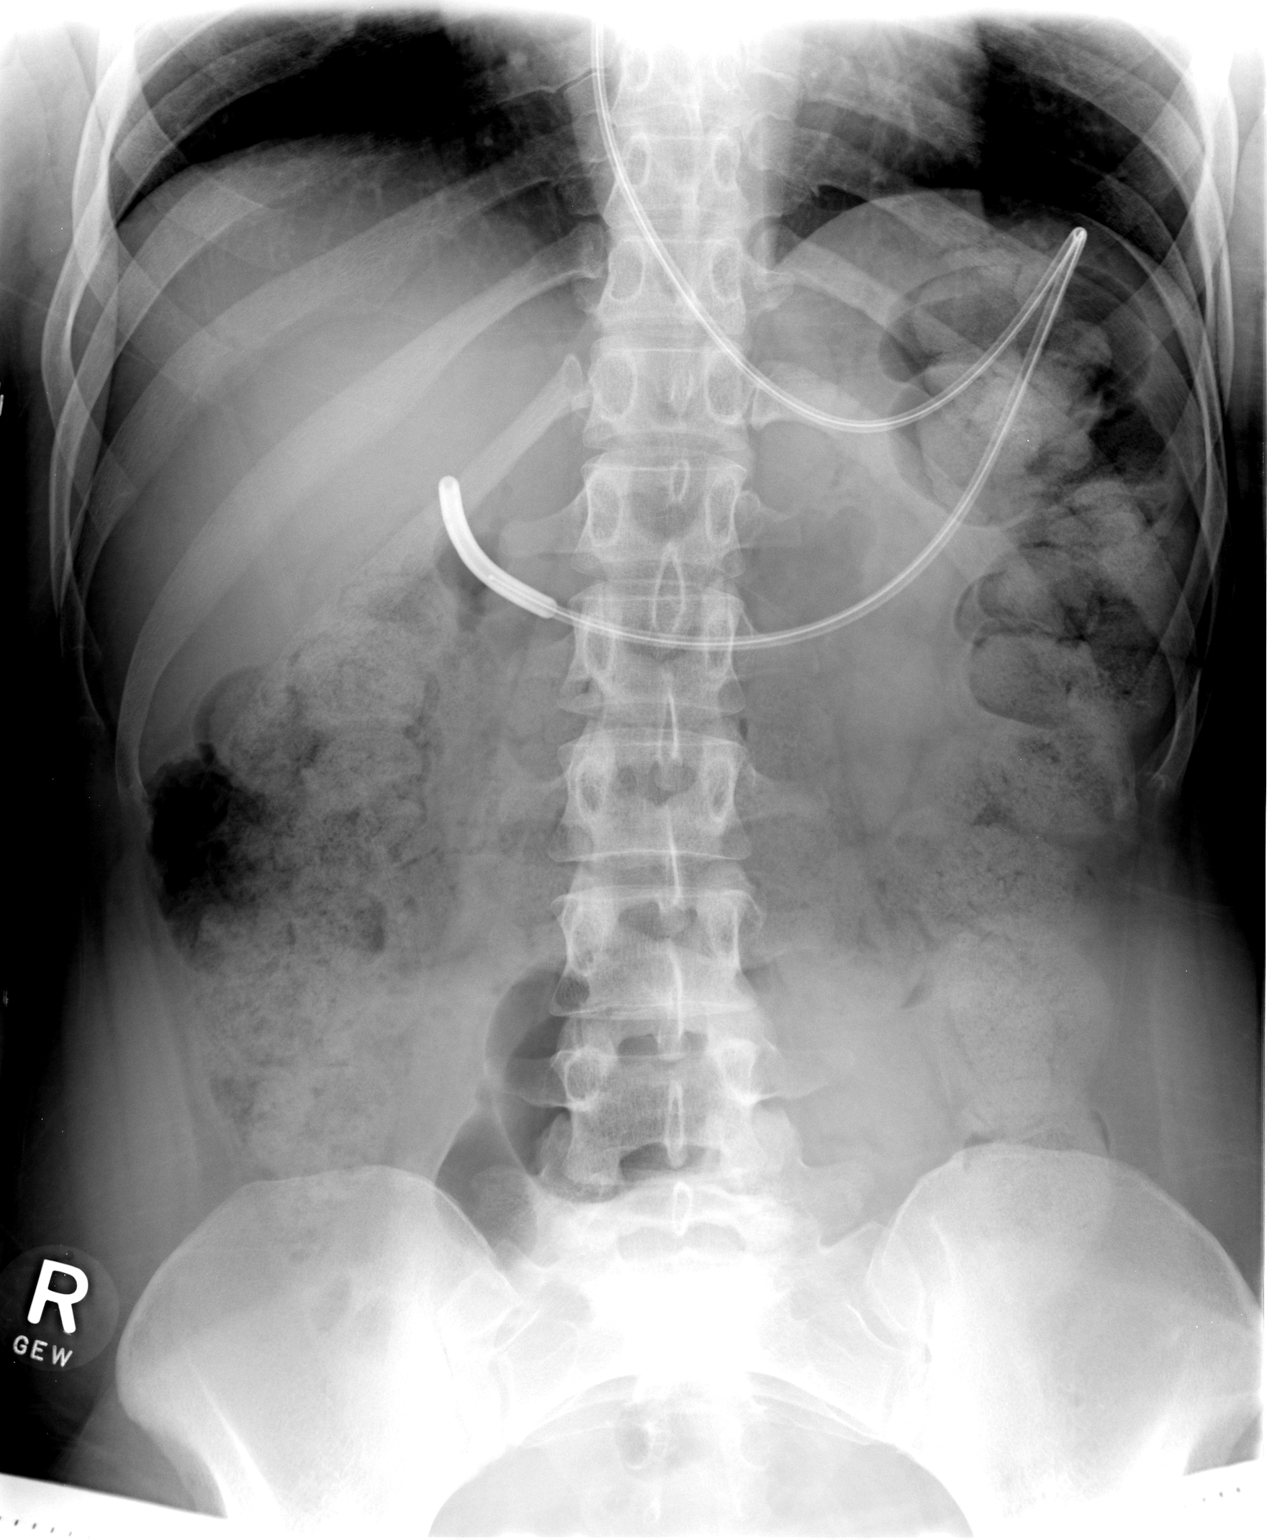

[1 of 1 positions shown; findings below may reference images not displayed]

FINDINGS: Single view.  The lower abdomen and pelvis are double
shielded as before. Feeding tube tip in the region of the duodenal
bulb.
IMPRESSION: Feeding tube tip in the region of the duodenal bulb.

## 2010-02-23 IMAGING — CR DG CHEST 1V
1 series · 1 of 1 positions shown · non-contrast
Comparison: Lumbar radiographs [DATE].

CLINICAL DATA: 24-year-old female status post feeding tube
placement.

CHEST - 1 VIEW

[view not recorded]
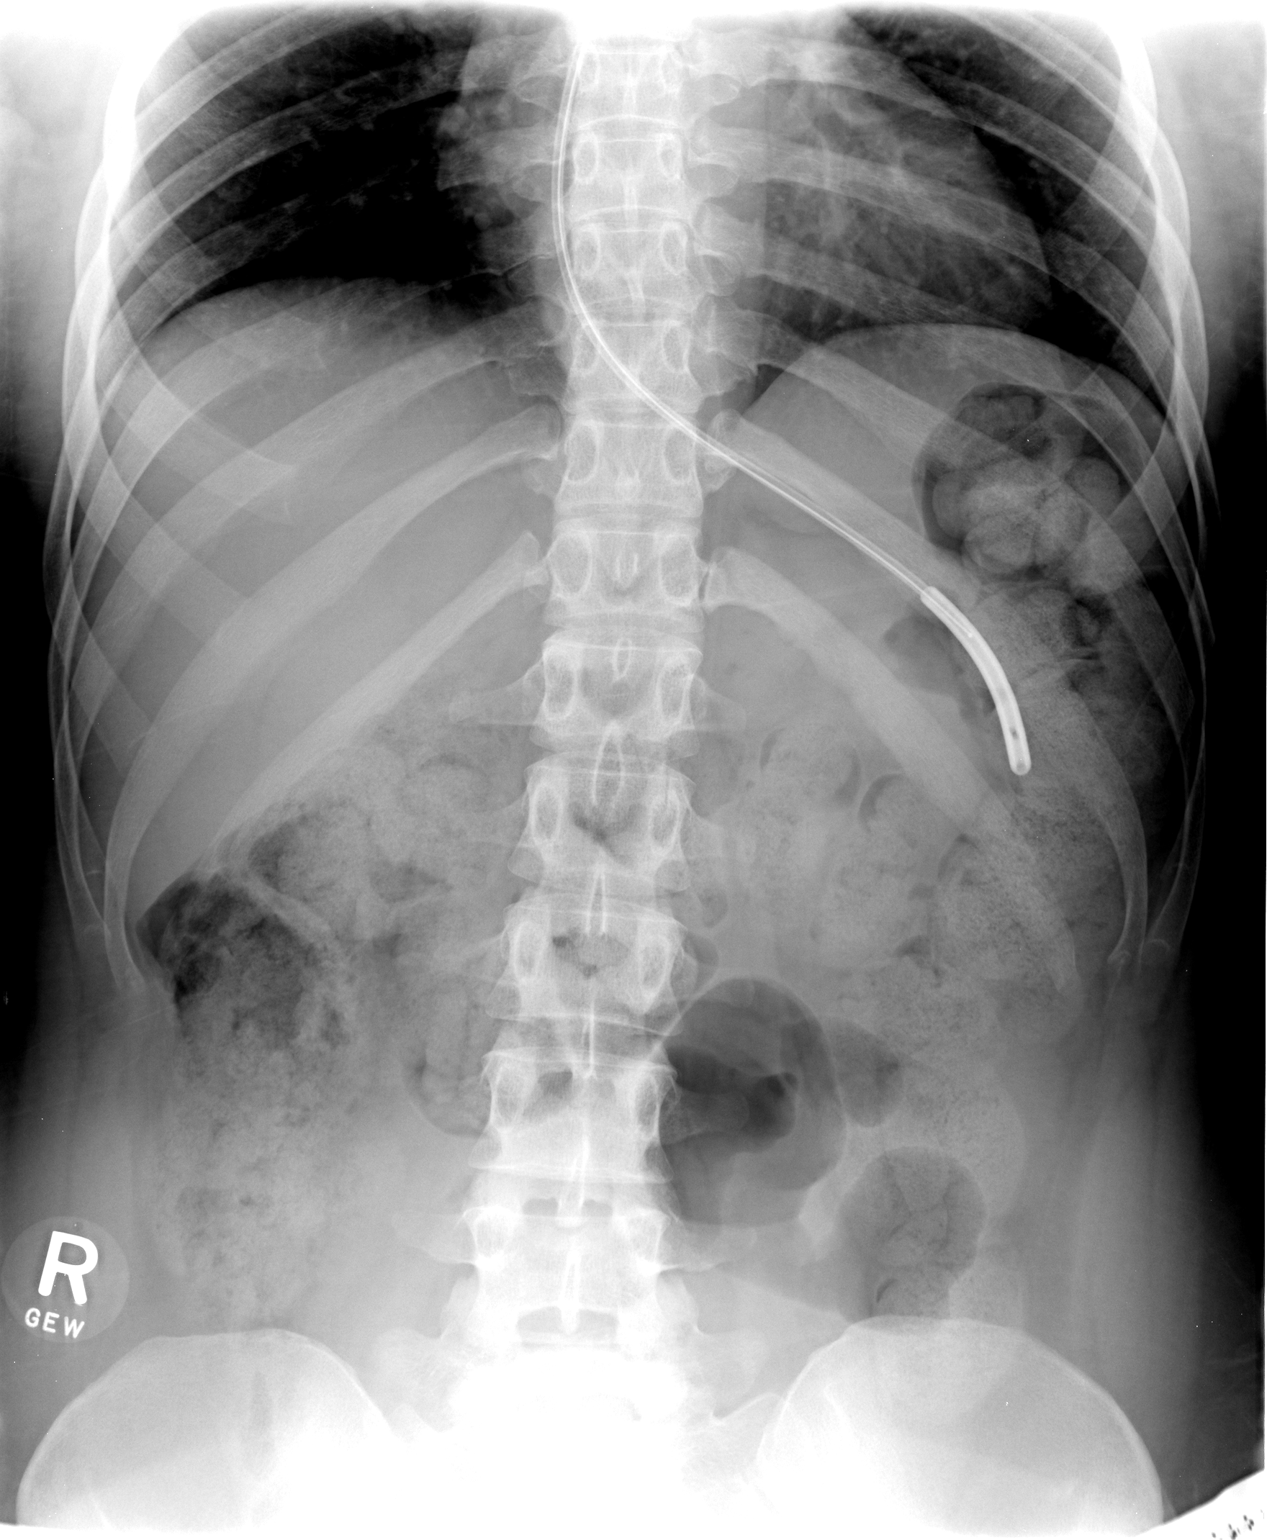

[1 of 1 positions shown; findings below may reference images not displayed]

FINDINGS: The patient's lower abdomen was double shielded for the
exam.

Feeding tube tip in the region of the body of the stomach  clear
lung bases. Visualized bowel gas pattern is nonobstructed.  .
IMPRESSION: Feeding tube tip in the region of the body of the stomach.

## 2010-02-24 ENCOUNTER — Inpatient Hospital Stay (HOSPITAL_COMMUNITY)
Admission: AD | Admit: 2010-02-24 | Discharge: 2010-03-04 | DRG: 781 | Disposition: A | Discharge status: Home / Self Care | Payer: PRIVATE HEALTH INSURANCE | Source: Ambulatory Visit | Attending: Family Medicine | Admitting: Family Medicine

## 2010-02-24 ENCOUNTER — Inpatient Hospital Stay (HOSPITAL_COMMUNITY): Payer: PRIVATE HEALTH INSURANCE

## 2010-02-24 ENCOUNTER — Other Ambulatory Visit: Payer: Self-pay

## 2010-02-24 DIAGNOSIS — E876 Hypokalemia: Secondary | ICD-10-CM

## 2010-02-24 DIAGNOSIS — E860 Dehydration: Secondary | ICD-10-CM

## 2010-02-24 DIAGNOSIS — O211 Hyperemesis gravidarum with metabolic disturbance: Principal | ICD-10-CM | POA: Diagnosis present

## 2010-02-24 LAB — COMPREHENSIVE METABOLIC PANEL
ALT: 147 U/L — ABNORMAL HIGH (ref 0–35)
Alkaline Phosphatase: 117 U/L (ref 39–117)
BUN: 9 mg/dL (ref 6–23)
CO2: 23 mEq/L (ref 19–32)
GFR calc non Af Amer: >60 mL/min (ref >60)
Glucose, Bld: 104 mg/dL — ABNORMAL HIGH (ref 70–99)
Potassium: 3.6 mEq/L (ref 3.5–5.1)
Sodium: 134 mEq/L — ABNORMAL LOW (ref 135–145)
Total Protein: 7.9 g/dL (ref 6.0–8.3)

## 2010-02-24 LAB — PHOSPHORUS: Phosphorus: 4.1 mg/dL (ref 2.3–4.6)

## 2010-02-24 LAB — MAGNESIUM: Magnesium: 1.9 mg/dL (ref 1.5–2.5)

## 2010-02-26 LAB — HEPATITIS PANEL, ACUTE
HCV Ab: NEGATIVE
Hep A IgM: NEGATIVE
Hep B C IgM: NEGATIVE

## 2010-02-26 LAB — COMPREHENSIVE METABOLIC PANEL
ALT: 126 U/L — ABNORMAL HIGH (ref 0–35)
AST: 49 U/L — ABNORMAL HIGH (ref 0–37)
CO2: 26 mEq/L (ref 19–32)
Calcium: 8.7 mg/dL (ref 8.4–10.5)
Chloride: 100 mEq/L (ref 96–112)
Creatinine, Ser: 0.39 mg/dL — ABNORMAL LOW (ref 0.4–1.2)
GFR calc Af Amer: >60 mL/min (ref >60)
GFR calc non Af Amer: >60 mL/min (ref >60)
Glucose, Bld: 91 mg/dL (ref 70–99)
Total Bilirubin: 0.4 mg/dL (ref 0.3–1.2)

## 2010-02-28 ENCOUNTER — Inpatient Hospital Stay (HOSPITAL_COMMUNITY): Payer: PRIVATE HEALTH INSURANCE

## 2010-02-28 DIAGNOSIS — R1115 Cyclical vomiting syndrome unrelated to migraine: Secondary | ICD-10-CM

## 2010-02-28 DIAGNOSIS — R740 Nonspecific elevation of levels of transaminase and lactic acid dehydrogenase [LDH]: Secondary | ICD-10-CM

## 2010-02-28 DIAGNOSIS — R7401 Elevation of levels of liver transaminase levels: Secondary | ICD-10-CM

## 2010-02-28 LAB — POCT URINALYSIS DIPSTICK
Ketones, ur: >=160 mg/dL — AB
Protein, ur: >=300 mg/dL — AB
Specific Gravity, Urine: >=1.03 (ref 1.005–1.030)
Urobilinogen, UA: >=8 mg/dL (ref 0.0–1.0)
pH: 6 (ref 5.0–8.0)

## 2010-02-28 IMAGING — US US ABDOMEN COMPLETE
1 series · 14 of 25 positions shown · non-contrast
Comparison: [DATE]

CLINICAL DATA: Hyperemesis.  Gravida 2, para 1.  17 weeks
gestational age.

COMPLETE ABDOMINAL ULTRASOUND

[Series 1: us abdomen complete · 14 of 82 slices shown]
[im 1/82]
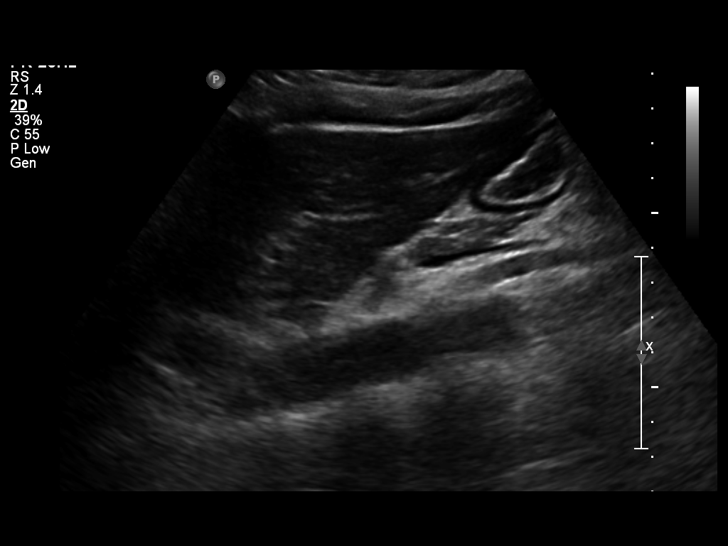
[im 7/82]
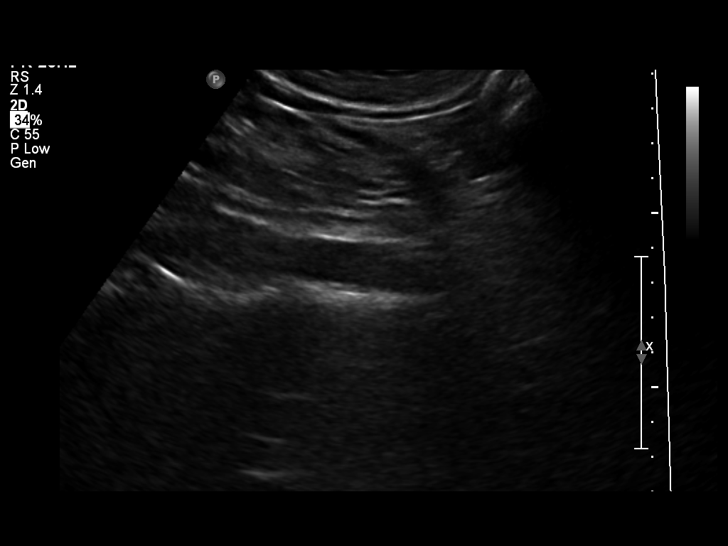
[im 14/82]
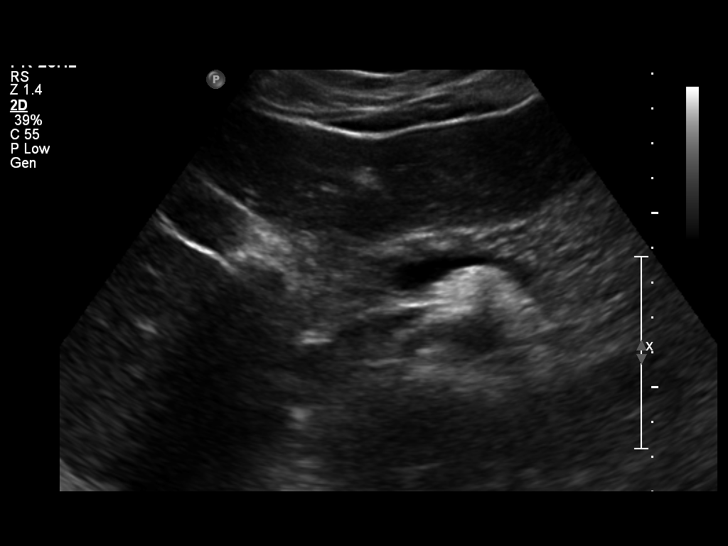
[im 21/82]
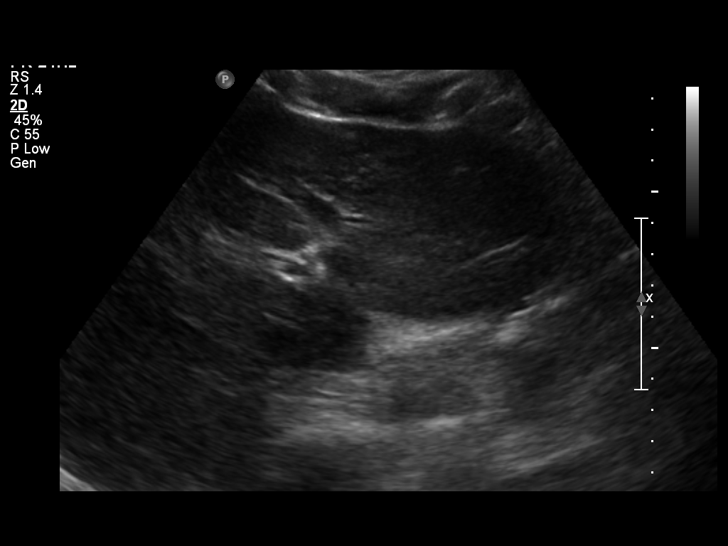
[im 28/82]
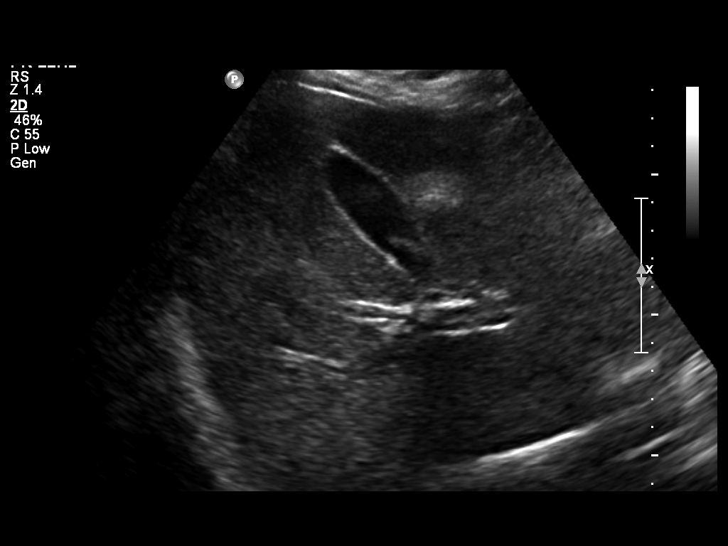
[im 31/82]
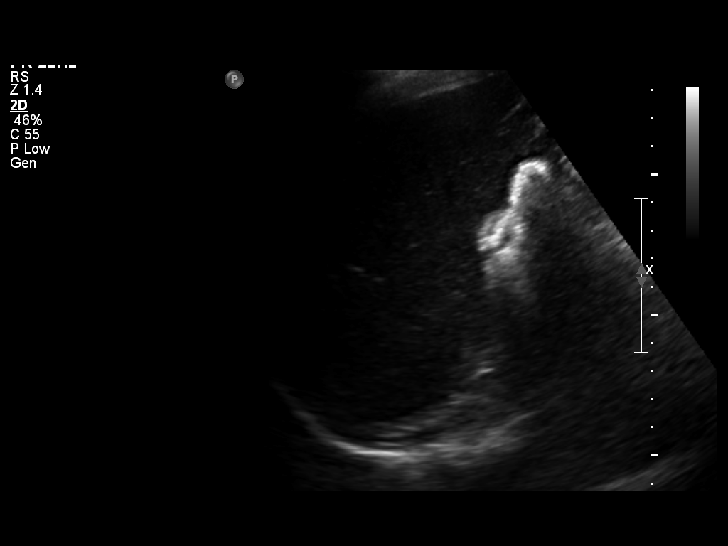
[im 38/82]
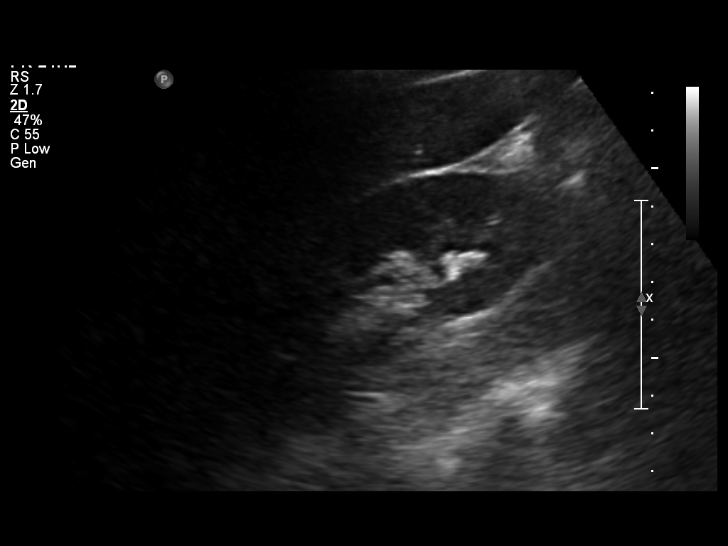
[im 44/82]
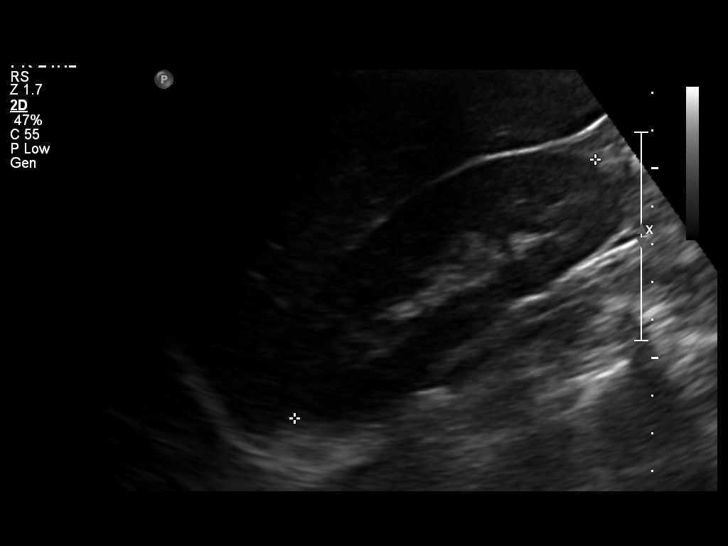
[im 51/82]
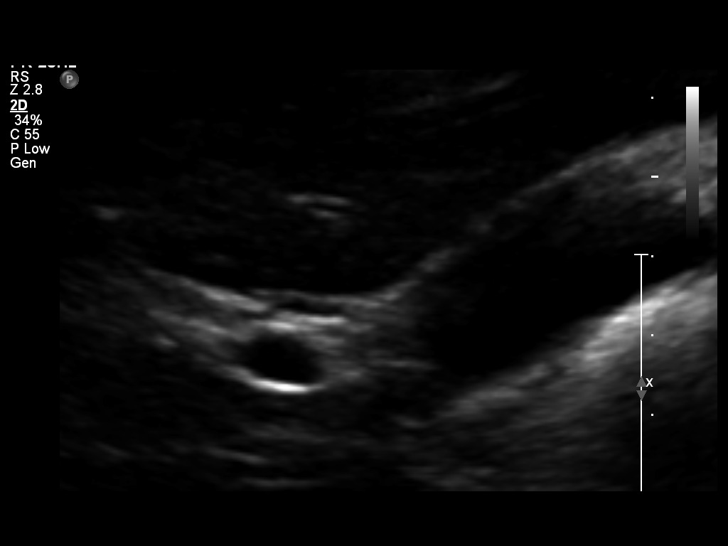
[im 55/82]
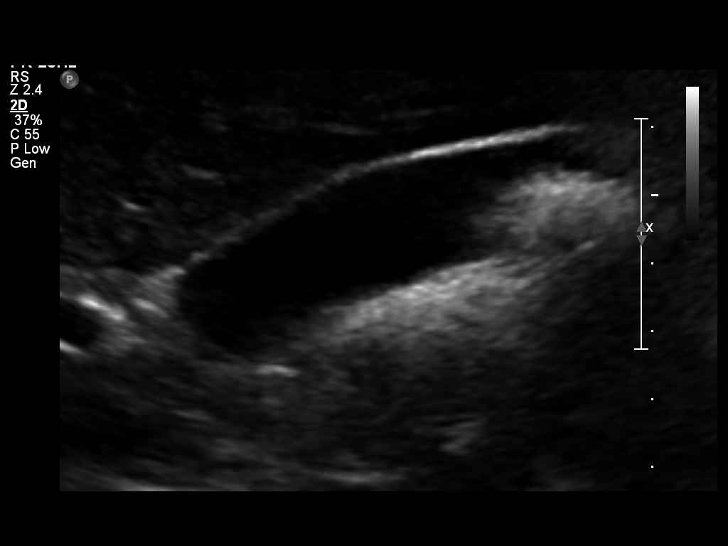
[im 61/82]
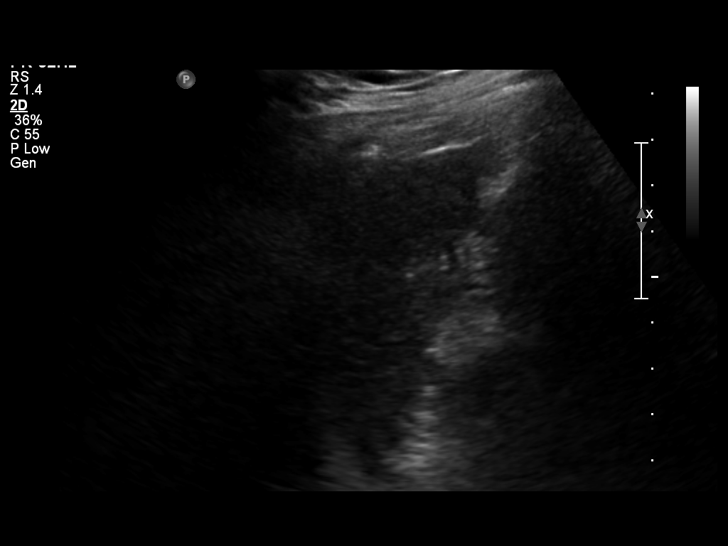
[im 68/82]
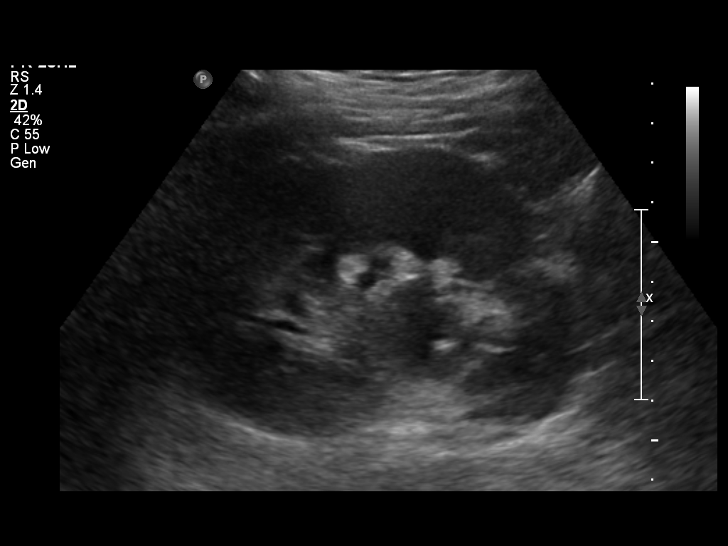
[im 75/82]
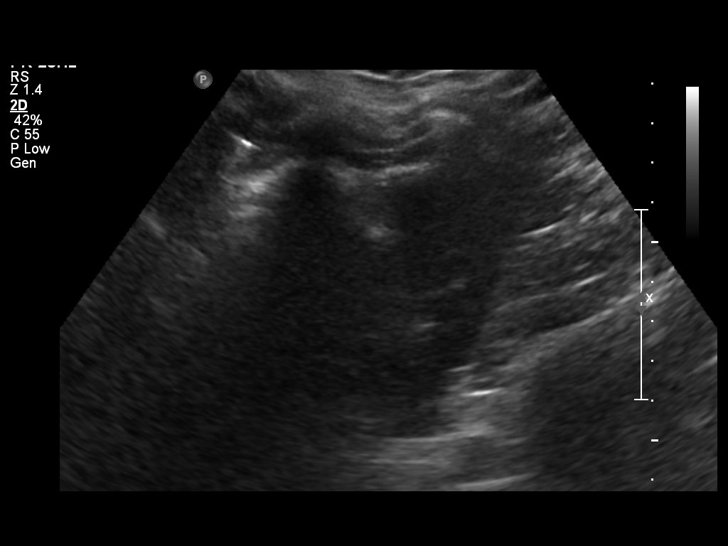
[im 82/82]
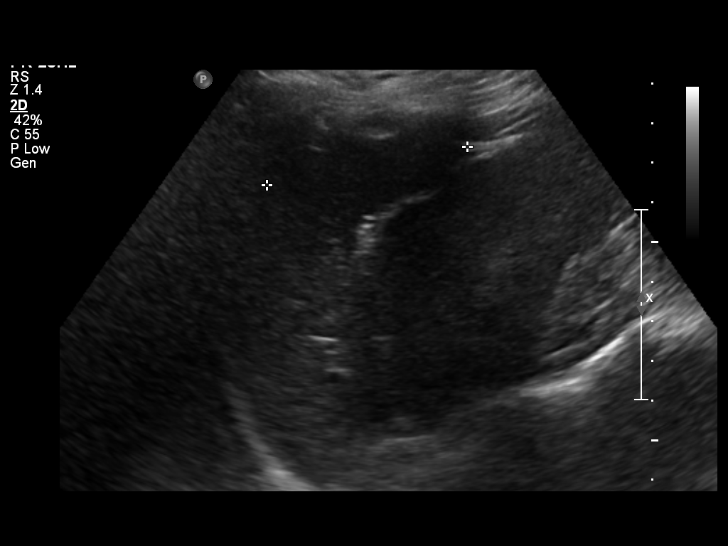

[14 of 25 positions shown; findings below may reference images not displayed]

FINDINGS: Gallbladder:  No gallstones, gallbladder wall thickening, or
pericholecystic fluid.  No sonographic Murphy's sign.

Common bile duct:  Measures 3.1 mm.

Liver:  No focal lesion.  Echogenicity within normal limits.

IVC:  Appears normal.

Pancreas:  No focal abnormality seen.

Spleen:  The spleen is 5.2 cm in length and has a normal
appearance.

Right Kidney:  The right kidney is 10.9 cm in length and has a
normal appearance.

Left Kidney:  The left kidney is 10.8 cm in length and has a normal
appearance.

Abdominal aorta:  No aneurysm identified.  Aorta is 1.9 cm.
IMPRESSION: 1.  No evidence for acute cholecystitis.
2.  No hydronephrosis.

## 2010-02-28 NOTE — Discharge Summary (Addendum)
NAME:  Evelyn Marshall, Evelyn Marshall              ACCOUNT NO.:  [id number]  MEDICAL RECORD NO.:  [id number]          PATIENT TYPE:  INP  LOCATION:  9309                          FACILITY:  [facility]  PHYSICIAN:  Tanya S. Shawnie Marshall, M.D.   DATE OF BIRTH:  1985/06/24  DATE OF ADMISSION:  02/14/2010 DATE OF DISCHARGE:  02/22/2010                              DISCHARGE SUMMARY   ADMISSION DIAGNOSES: 1. Intrauterine pregnancy at 14 weeks and 4 days. 2. Hyperemesis gravidarum. 3. Hypokalemia.  DISCHARGE DIAGNOSES: 1. Viable intrauterine pregnancy at 15 weeks and 2 days. 2. Hyperemesis gravidarum, stable.  DISCHARGE MEDICATIONS:  The patient was to continue her home medications of: 1. Robinul 1 tablet by mouth twice daily as needed. 2. Zofran 8 mg 1 tab p.o. q.8 hours as needed. 3. Protonix 40 mg 1 tab p.o. daily. 4. We did change her Phenergan from a rectal suppository to a p.o.     tablet which was the Phenergan 25 mg 1 tab p.o. q.4 hours as     needed. 5. She will continue on home tube feeds as well at the time of     discharge with the goal of 75 which she has reached prior to     discharge.  PERTINENT LABS:  Her pain care labs on admission were, CBC was notable for white count of 11.9, hemoglobin was 11.8, hematocrit 35.2, platelets of 354.  Urinalysis was negative but did have some ketones.  CMP was notable for a potassium of 3.0, a sodium of 134, chloride of 99, CO2 was 24, glucose was 106, BUN was 5, creatinine was 0.52.  LFTs were normal and the protein was actually 7.6 which was normal.  Albumin was 3.4 which was slightly low.  Additional pertinent labs for 24-hour urine with a protein of 155.  Repeat CBC on February 18, 2010, had a white count of 9.9, hemoglobin 9.7, hematocrit 29.2, platelets 238.  CMP now had a potassium of 3.3 and her LFTs remained normal.  Creatinine was slightly low at 0.37.  Her BUN was low at 1.  Her magnesium was 1.5 at that time and she was also repleted as well for  her potassium.  HOSPITAL COURSE:  This is a 25 year old gravida 2, para 1-0-0-1 with intrauterine pregnancy initially at 14 weeks and 4 days, who presented with recurrent hyperemesis status post recent discharge with the tube feeds after having thrown up her tube feed and also with some dehydration and malnutrition.  The patient was subsequently admitted. Her weight on admission was 81.9 kilos.  She is otherwise afebrile. Tube feeds were titrated accordingly.  Her potassium was repleted, however, her hospital course was complicated by multiple episodes of recurrent emesis and replacement of her Dobbhoff NG tube.  Her tube feeding had to be continually stopped and restarted due to the emesis and throwing off her feeding tube.  She is otherwise afebrile the entire time.  She was repleted.  Her potassium was repleted.  She is otherwise stable and doing well.  Her NG tube stayed down for 48 hours prior to discharge.  She was otherwise tolerating clear liquids at that  time as well.  Her course was noted to be complicated also by some mild constipation which was controlled with Colace and occasional enema.  The patient was discharged home in stable condition, otherwise.  DISPOSITION:  Discharged home.  DISCHARGE CONDITION:  Stable.  FOLLOWUP:  The patient is to follow up in the [facility] on Thursday, February 24, 2010, at 9:30 a.m. for additional followup.  ER WARNINGS:  The patient is to return if any fever, chills, worsening nausea, vomiting and if she threw up her feeding tube, she is to return immediately to the MAU for replacement of her feeding tube, any additional labs if necessary, any contractions, bleeding, spotting, or any other concerning symptoms.    ______________________________ Evelyn Kaufmann, MD   ______________________________ Evelyn Marshall, M.D.    LC/MEDQ  D:  02/22/2010  T:  02/23/2010  Job:  [id number]  Electronically Signed by Tinnie Gens M.D. on  02/28/2010 08:54:46 AM Electronically Signed by Evelyn Kaufmann MD on 03/08/2010 09:22:04 AM

## 2010-03-01 ENCOUNTER — Inpatient Hospital Stay (HOSPITAL_COMMUNITY): Payer: PRIVATE HEALTH INSURANCE

## 2010-03-01 DIAGNOSIS — R740 Nonspecific elevation of levels of transaminase and lactic acid dehydrogenase [LDH]: Secondary | ICD-10-CM

## 2010-03-01 DIAGNOSIS — R112 Nausea with vomiting, unspecified: Secondary | ICD-10-CM

## 2010-03-01 DIAGNOSIS — R7401 Elevation of levels of liver transaminase levels: Secondary | ICD-10-CM

## 2010-03-01 LAB — CBC
HCT: 30.3 % — ABNORMAL LOW (ref 36.0–46.0)
MCH: 25 pg — ABNORMAL LOW (ref 26.0–34.0)
MCHC: 32 g/dL (ref 30.0–36.0)
MCV: 78.1 fL (ref 78.0–100.0)
Platelets: 314 10*3/uL (ref 150–400)
RDW: 16.3 % — ABNORMAL HIGH (ref 11.5–15.5)

## 2010-03-01 LAB — COMPREHENSIVE METABOLIC PANEL
Albumin: 2.6 g/dL — ABNORMAL LOW (ref 3.5–5.2)
Alkaline Phosphatase: 89 U/L (ref 39–117)
BUN: 2 mg/dL — ABNORMAL LOW (ref 6–23)
Calcium: 8.5 mg/dL (ref 8.4–10.5)
Creatinine, Ser: 0.4 mg/dL (ref 0.4–1.2)
Glucose, Bld: 78 mg/dL (ref 70–99)
Total Protein: 5.9 g/dL — ABNORMAL LOW (ref 6.0–8.3)

## 2010-03-01 LAB — HEPATIC FUNCTION PANEL
ALT: 78 U/L — ABNORMAL HIGH (ref 0–35)
AST: 34 U/L (ref 0–37)
Albumin: 2.5 g/dL — ABNORMAL LOW (ref 3.5–5.2)
Alkaline Phosphatase: 88 U/L (ref 39–117)
Total Protein: 6.1 g/dL (ref 6.0–8.3)

## 2010-03-01 IMAGING — CR DG CHEST 1V
1 series · 1 of 1 positions shown · non-contrast
Comparison: [DATE]

CLINICAL DATA: Evaluate position of feeding tube.

CHEST - 1 VIEW

[view not recorded]
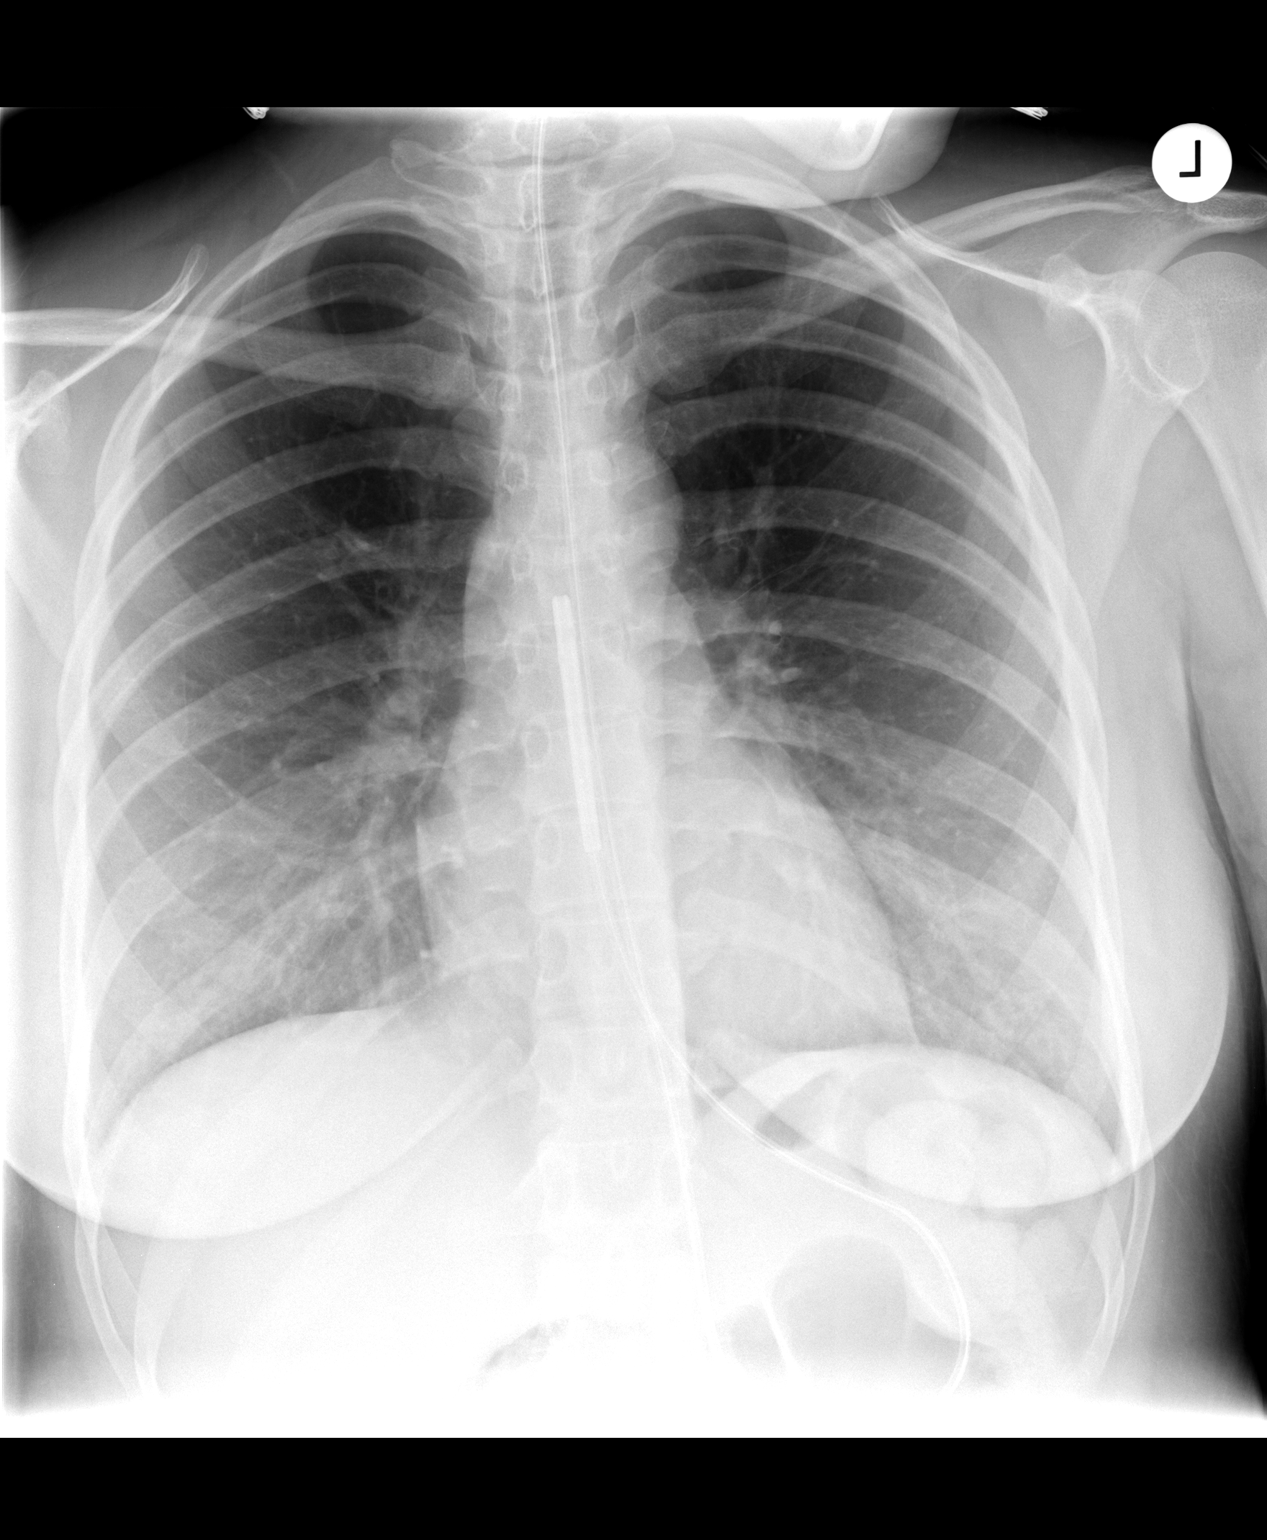

[1 of 1 positions shown; findings below may reference images not displayed]

FINDINGS: Feeding tube has been repositioned.  Tip is coiled in the
gastric fundus, redirected cephalad in the mid esophagus.  Heart
size is normal.  There is minimal bilateral streaky atelectasis.
IMPRESSION: Feeding tube coiled.  Tip in the esophagus.

## 2010-03-01 IMAGING — CR DG CHEST 1V
1 series · 1 of 1 positions shown · non-contrast
Comparison: [DATE]

CLINICAL DATA: Nasogastric tube placement.  The patient was
shielded.  16 weeks pregnant.

CHEST - 1 VIEW

[view not recorded]
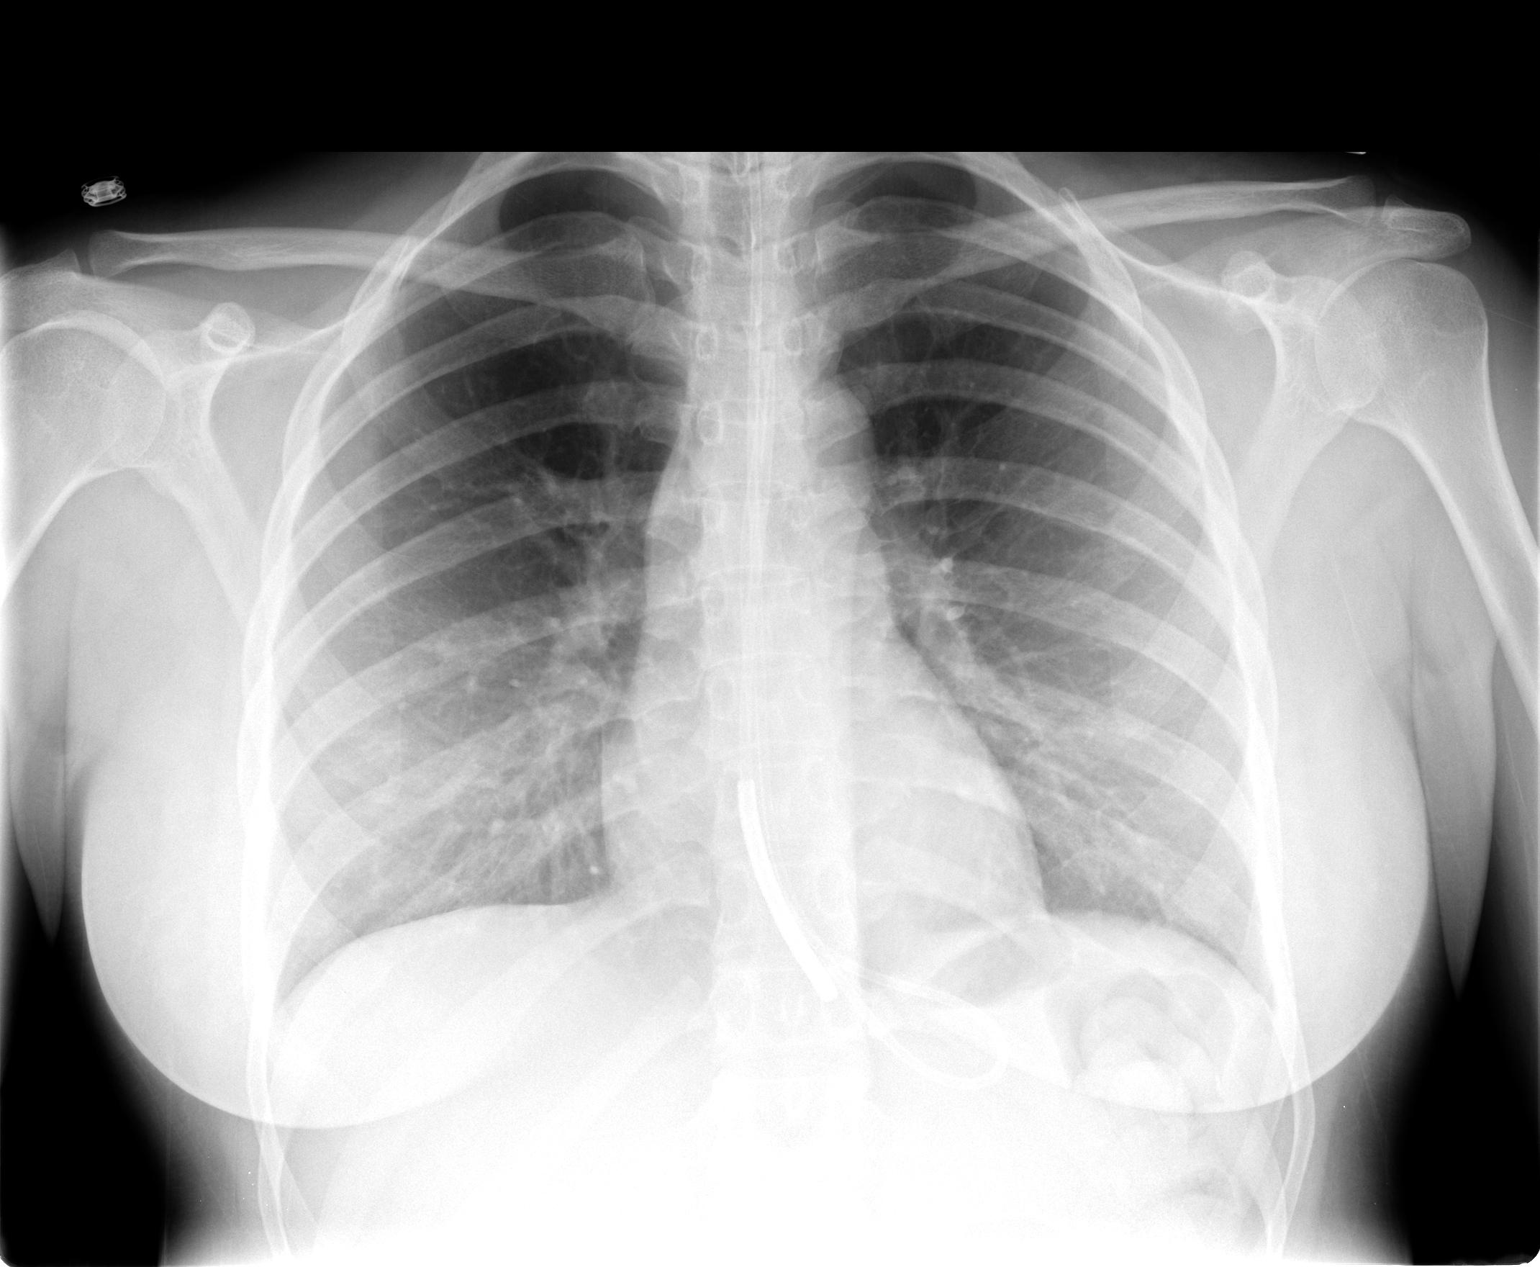

[1 of 1 positions shown; findings below may reference images not displayed]

FINDINGS: Nasogastric tube has been repositioned.  Tube is coiled
within the esophagus, tip directed inferiorly at the
gastroesophageal junction.

Cardiomediastinal silhouette is within normal limits.  The lungs
are free of focal consolidations and pleural effusions.
IMPRESSION: Feeding tube is coiled in the esophagus.

## 2010-03-01 IMAGING — CR DG CHEST 1V
1 series · 1 of 1 positions shown · non-contrast
Comparison: Same date, time 15 50

CLINICAL DATA: Feeding tube placement

CHEST - 1 VIEW

[view not recorded]
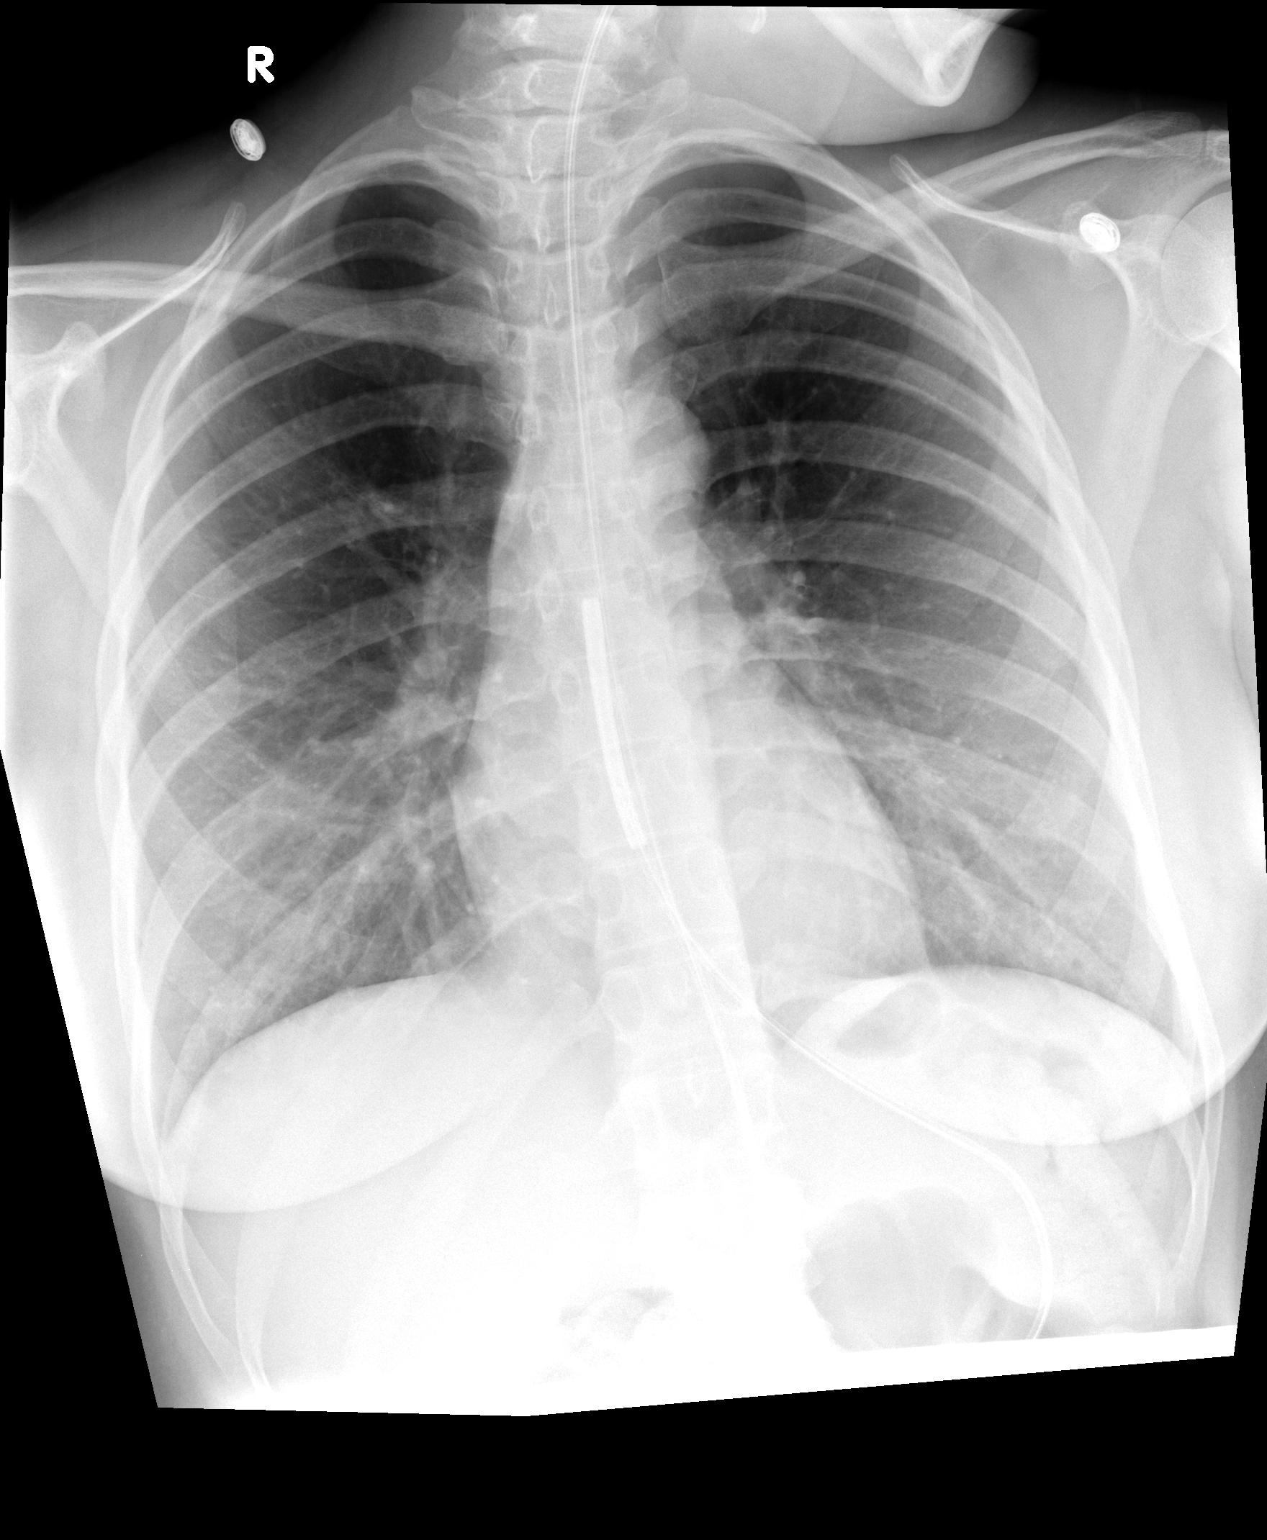

[1 of 1 positions shown; findings below may reference images not displayed]

FINDINGS: The feeding tube remains coiled upon itself with a
portion of the gastric body and the weighted tip in the midportion
of the esophagus.  The lungs are clear.  The heart is normal in
size.  The upper abdomen is normal.
IMPRESSION: Unchanged position of the feeding tube with the weighted tip seen
in the mid esophagus.

## 2010-03-02 DIAGNOSIS — R633 Feeding difficulties, unspecified: Secondary | ICD-10-CM

## 2010-03-02 LAB — ANA: Anti Nuclear Antibody(ANA): NEGATIVE

## 2010-03-03 DIAGNOSIS — R7402 Elevation of levels of lactic acid dehydrogenase (LDH): Secondary | ICD-10-CM

## 2010-03-03 DIAGNOSIS — R633 Feeding difficulties, unspecified: Secondary | ICD-10-CM

## 2010-03-03 DIAGNOSIS — R740 Nonspecific elevation of levels of transaminase and lactic acid dehydrogenase [LDH]: Secondary | ICD-10-CM

## 2010-03-03 DIAGNOSIS — R112 Nausea with vomiting, unspecified: Secondary | ICD-10-CM

## 2010-03-03 DIAGNOSIS — F4321 Adjustment disorder with depressed mood: Secondary | ICD-10-CM

## 2010-03-03 DIAGNOSIS — R7401 Elevation of levels of liver transaminase levels: Secondary | ICD-10-CM

## 2010-03-04 ENCOUNTER — Inpatient Hospital Stay (HOSPITAL_COMMUNITY): Payer: PRIVATE HEALTH INSURANCE

## 2010-03-04 IMAGING — US US OB COMP +14 WK
1 series · 12 of 28 positions shown · non-contrast
Comparison: none

[Series 1: us ob comp +14 wk · 12 of 81 slices shown]
[im 3/81]
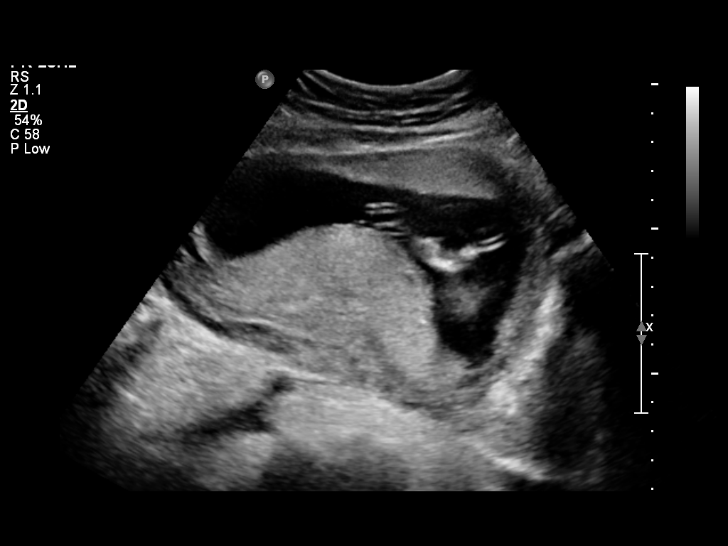
[im 9/81]
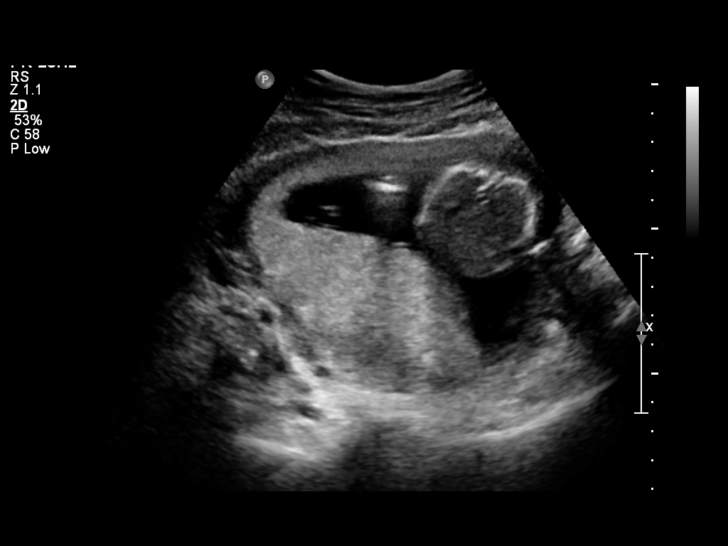
[im 15/81]
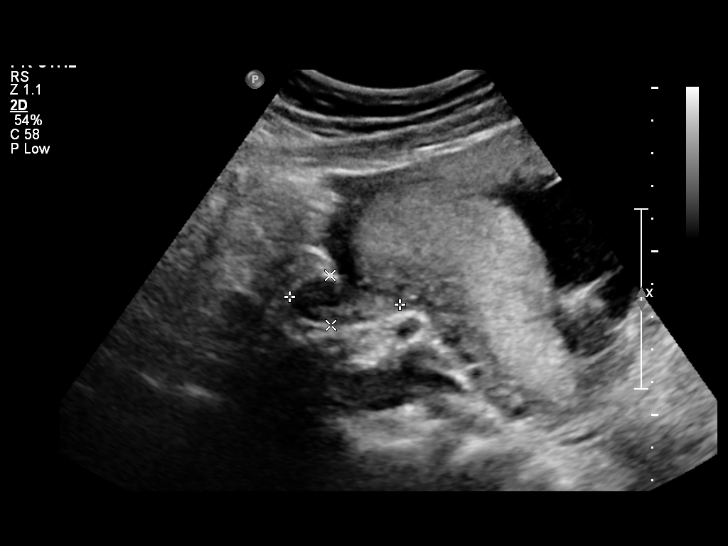
[im 24/81]
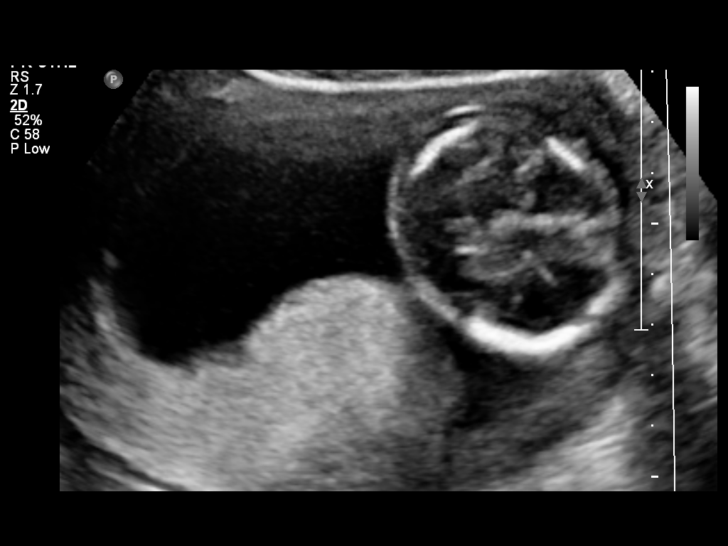
[im 30/81]
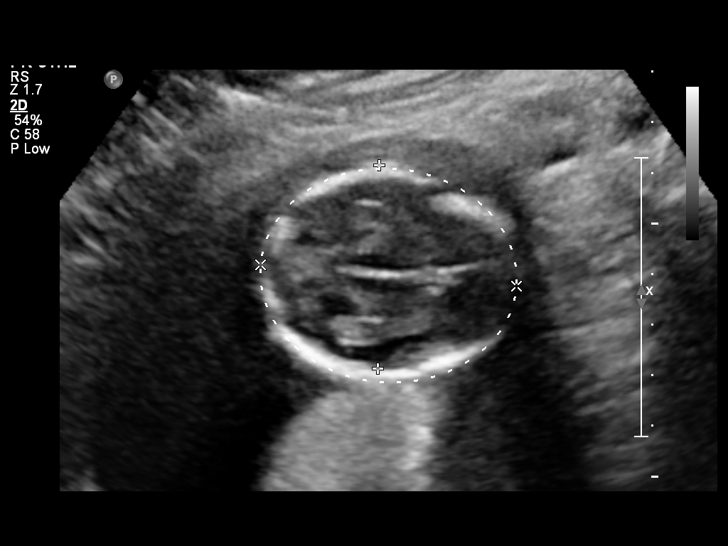
[im 36/81]
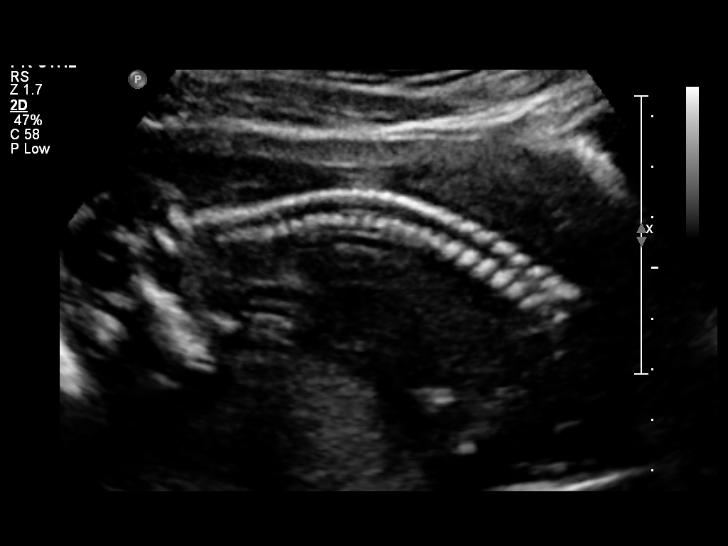
[im 45/81]
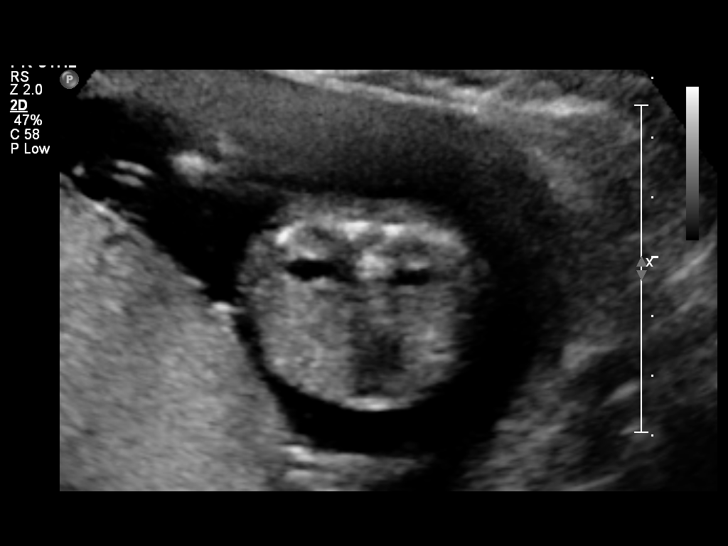
[im 51/81]
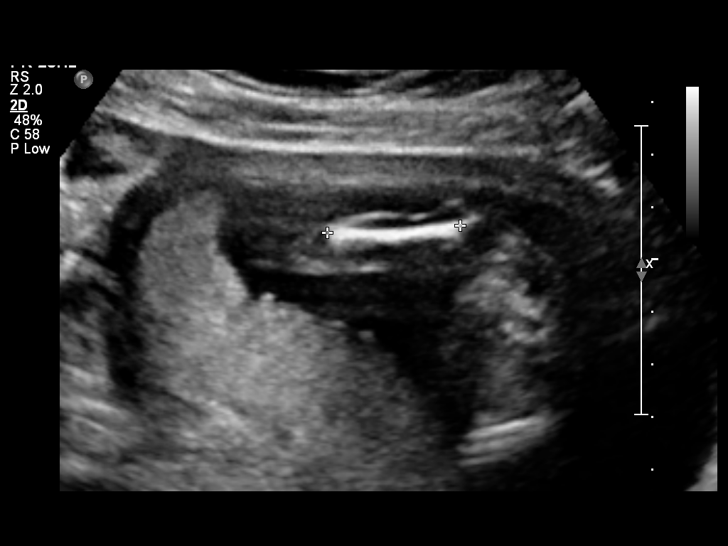
[im 57/81]
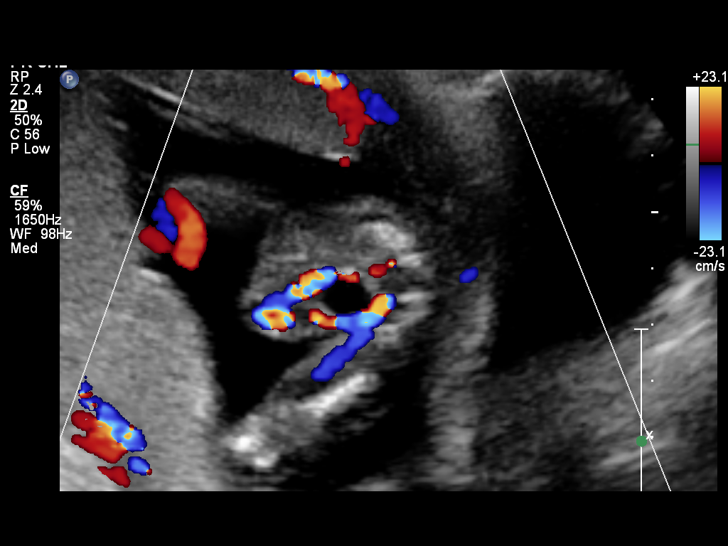
[im 66/81]
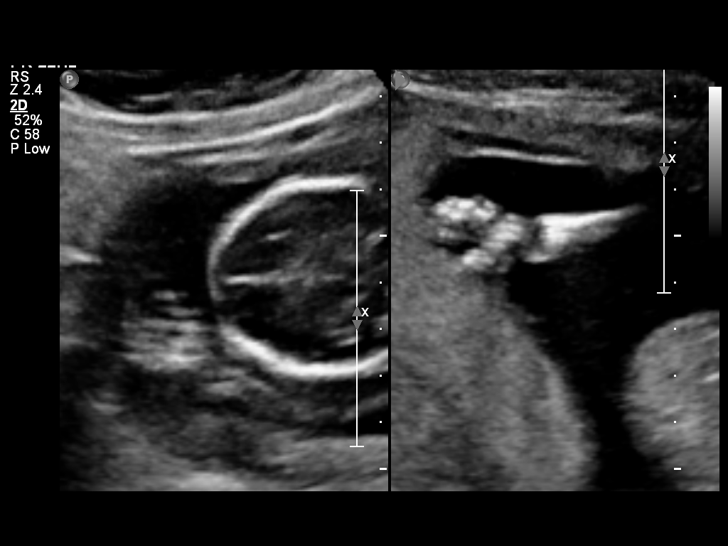
[im 72/81]
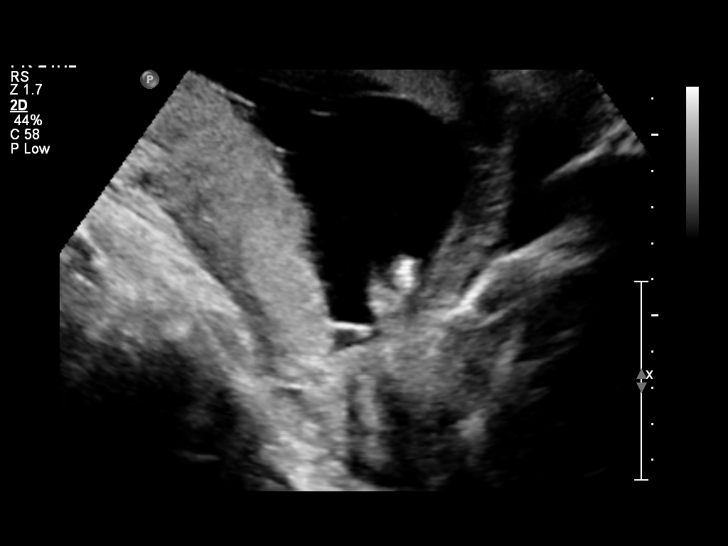
[im 78/81]
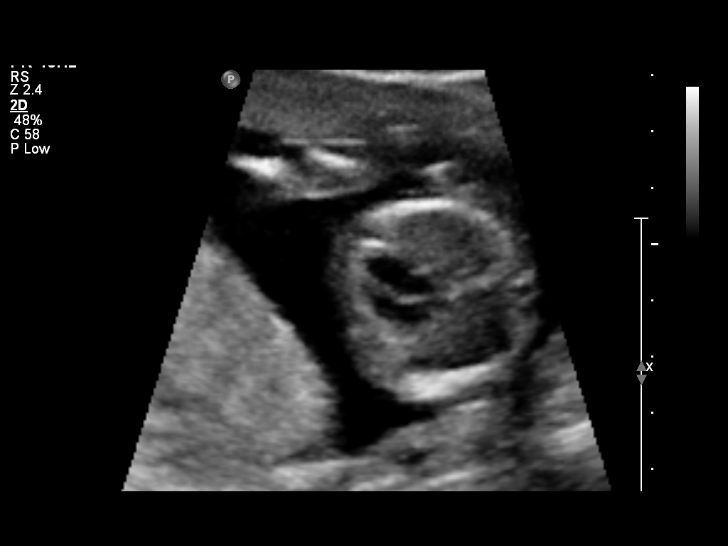

[12 of 28 positions shown; findings below may reference images not displayed]

OBSTETRICS REPORT
                      (Signed Final [DATE] [DATE])

 Order#:         [PHONE_NUMBER]_I
Procedures

 US OB COMP + 14 WK                                    76805.1
Indications

 Basic anatomic survey
 Assess Fetal Growth / Estimated Fetal Weight
 Hyperemesis gravidarum
 Poor obstetric history: Previous preeclampsia         [H8]
Fetal Evaluation

 Fetal Heart Rate:  150                          bpm
 Cardiac Activity:  Observed
 Presentation:      Breech
 Placenta:          Posterior Marginal Previa
 P. Cord            Visualized
 Insertion:

 Amniotic Fluid
 AFI FV:      Subjectively within normal limits
                                             Larg Pckt:     5.5  cm
Biometry

 BPD:       40  mm     G. Age:  18w 1d                CI:        74.67   70 - 86
                                                      FL/HC:      18.6   14.6 -

 HC:     146.9  mm     G. Age:  17w 6d       76  %    HC/AC:      1.13   1.07 -

 AC:     129.5  mm     G. Age:  18w 4d       88  %    FL/BPD:
 FL:      27.3  mm     G. Age:  18w 2d       85  %    FL/AC:      21.1   20 - 24
 HUM:     25.1  mm     G. Age:  17w 6d       75  %
 CER:       17  mm     G. Age:  17w 0d       47  %
 NFT:     3.68  mm

 Est. FW:     236  gm      0 lb 8 oz   > 75  %
Gestational Age

 LMP:           17w 1d        Date:  [DATE]                 EDD:   [DATE]
 U/S Today:     18w 2d                                        EDD:   [DATE]
 Best:          17w 1d     Det. By:  LMP  ([DATE])          EDD:   [DATE]
Anatomy

 Cranium:           Appears normal      Aortic Arch:       Basic anatomy
                                                           exam per order
 Fetal Cavum:       Appears normal      Ductal Arch:       Basic anatomy
                                                           exam per order
 Ventricles:        Appears normal      Diaphragm:         Basic anatomy
                                                           exam per order
 Choroid Plexus:    Appears normal      Stomach:           Appears
                                                           normal, left
                                                           sided
 Cerebellum:        Appears normal      Abdomen:           Appears normal
 Posterior Fossa:   Appears normal      Abdominal Wall:    Appears nml
                                                           (cord insert,
                                                           abd wall)
 Nuchal Fold:       Appears normal      Cord Vessels:      Appears normal
                    (neck, nuchal                          (3 vessel cord)
                    fold)
 Face:              Not well            Kidneys:           Bilat
                    visualized
                                                           pyelectasis, Rt
                                                           4.8 mm, Lt
                                                           mm
 Heart:             Axis appears        Bladder:           Appears normal
                    normal
 RVOT:              Basic anatomy       Spine:             Appears normal
                    exam per order
 LVOT:              Basic anatomy       Limbs:             Four extremities
                    exam per order                         visualized
                                                           (basic anatomy
                                                           exam)

 Other:     Fetus appears to be a male.
Cervix Uterus Adnexa

 Cervical Length:    3.3      cm

 Cervix:       Normal appearance by transabdominal scan.

 Left Ovary:    Within normal limits.
 Right Ovary:   Within normal limits.
 Adnexa:     No abnormality visualized.
Impression

 SIUP with an EGA by US of 18w 2d. This is correlated with
 expected EGA by LMP of 17w 1d.

 Bilateral mild renal pyectasis is noted and this is considered a
 soft marker for Down Syndrome. The remaining visualized
 anatomy appears normal, but a detailed exam was not
 performed. Correlation with other aneuploidy screening
 results and consideration for full detailed exam is
 recommended.

 Current posterior marginal placental position. Follow up for
 placental positioning would be recommended at > 28 weeks.

 Subjectively and quantitatively normal amniotic fluid volume
 and normal cervical length.
Recommendations

 Consider full detailed evaluation given the presence of the
 bilateral pyelectasis, a soft marker for Down Syndrome.

 Follow-up at >28 weeks is recommended to reassess
 placental postion.

 questions or concerns.

## 2010-03-05 LAB — URINE CULTURE
Colony Count: 25000
Culture  Setup Time: 201202091948

## 2010-03-06 NOTE — Consult Note (Signed)
  NAME:  Evelyn, Marshall              ACCOUNT NO.:  [id number]  MEDICAL RECORD NO.:  [id number]           PATIENT TYPE:  I  LOCATION:  9305                          [facility]  PHYSICIAN:  Eulogio Ditch, MD DATE OF BIRTH:  1985/03/19  DATE OF CONSULTATION:  03/03/2010 DATE OF DISCHARGE:                                CONSULTATION   REASON FOR CONSULTATION:  Suspected depression.  HISTORY OF PRESENT ILLNESS:  A 25 year old [place] female who was admitted because of the pregnancy.  The patient is [redacted] weeks pregnant.  The patient told me that she was feeling very overwhelmed because she is in the hospital, different doctors see her, and different x-rays are done, feeding tube is put, and that is why she was feeling overwhelmed, but she denied any suicidal or homicidal ideation.  She told me that today she is feeling better.  She is logical and goal directed during the interview.  Denies hearing any voices.  Denies any homicidal thoughts towards the 58-year-old son.  PAST PSYCHIATRIC HISTORY:  None reported.  No history of hospitalization.  No history of suicide attempt.  Not on any psych medications or seen by a psychiatrist in the past.  SUBSTANCE ABUSE HISTORY:  None reported.  MEDICAL HISTORY:  No current medical issues except for 17 weeks pregnancy.  SOCIAL HISTORY:  The patient works in a third shift in [employer]. She lives with her father.  She is single.  Has a relationship with the boyfriend, but they do not live together.  She has one 17-year-old son.  MENTAL STATUS EXAMINATION:  The patient is calm and cooperative during the interview.  Fair eye contact.  No psychomotor agitation or retardation noted during the interview.  Speech normal.  Mood "okay." Affect mood congruent.  Thought process logical and goal directed. Thought content not suicidal or homicidal.  No delusional thought perception.  No audiovisual hallucination reported.  Not internally preoccupied.   Cognition alert, awake, oriented x3.  Memory immediate, recent, and remote intact.  Fund of knowledge fair.  Attention and concentration good.  Abstraction ability good.  Insight and judgment intact.  DIAGNOSES:  AXIS I:  Adjustment disorder, mixed type. AXIS II:  Deferred. AXIS III:  See medical notes. AXIS IV:  Admission in the hospital. AXIS V:  50-60.  RECOMMENDATIONS: 1. I will not start any antidepressant medication at this time. 2. Supportive psychotherapy given to the patient. 3. I advised the patient in future she will get benefit from     counseling in the outpatient setting. 4. I also told her that she can follow up, if she is feeling     depressed, at [facility] mental health in the outpatient setting.     Eulogio Ditch, MD     SA/MEDQ  D:  03/04/2010  T:  03/04/2010  Job:  [id number]  Electronically Signed by Eulogio Ditch  on 03/06/2010 02:00:14 PM

## 2010-03-07 ENCOUNTER — Inpatient Hospital Stay (HOSPITAL_COMMUNITY)
Admission: AD | Admit: 2010-03-07 | Discharge: 2010-03-10 | DRG: 781 | Disposition: A | Discharge status: Home / Self Care | Payer: Medicaid Other | Source: Ambulatory Visit | Attending: Obstetrics & Gynecology | Admitting: Obstetrics & Gynecology

## 2010-03-07 DIAGNOSIS — O210 Mild hyperemesis gravidarum: Principal | ICD-10-CM | POA: Diagnosis present

## 2010-03-07 LAB — COMPREHENSIVE METABOLIC PANEL
ALT: 219 U/L — ABNORMAL HIGH (ref 0–35)
Alkaline Phosphatase: 124 U/L — ABNORMAL HIGH (ref 39–117)
CO2: 23 mEq/L (ref 19–32)
GFR calc non Af Amer: >60 mL/min (ref >60)
Glucose, Bld: 93 mg/dL (ref 70–99)
Potassium: 2.9 mEq/L — ABNORMAL LOW (ref 3.5–5.1)
Sodium: 136 mEq/L (ref 135–145)

## 2010-03-07 LAB — CBC
HCT: 33.6 % — ABNORMAL LOW (ref 36.0–46.0)
Hemoglobin: 11.1 g/dL — ABNORMAL LOW (ref 12.0–15.0)
MCHC: 33 g/dL (ref 30.0–36.0)
MCV: 76.9 fL — ABNORMAL LOW (ref 78.0–100.0)
WBC: 9.8 10*3/uL (ref 4.0–10.5)

## 2010-03-07 LAB — URINALYSIS, ROUTINE W REFLEX MICROSCOPIC
Ketones, ur: >80 mg/dL — AB
Leukocytes, UA: NEGATIVE
Protein, ur: 100 mg/dL — AB
Urine Glucose, Fasting: NEGATIVE mg/dL
Urobilinogen, UA: 1 mg/dL (ref 0.0–1.0)

## 2010-03-07 LAB — URINE MICROSCOPIC-ADD ON

## 2010-03-07 NOTE — Discharge Summary (Signed)
NAME:  Evelyn Marshall, Evelyn Marshall NO.:  [id number]  MEDICAL RECORD NO.:  [id number]           PATIENT TYPE:  I  LOCATION:  9305                          [facility]  PHYSICIAN:  Lucina Mellow, DO   DATE OF BIRTH:  [birth year]  DATE OF ADMISSION:  02/24/2010 DATE OF DISCHARGE:  03/04/2010                              DISCHARGE SUMMARY   The patient was admitted directly from the [facility] on February 2 for continued hyperemesis gravidarum.  HISTORY OF PRESENT ILLNESS:  She is a 25 year old gravida 2, para 1 who presented at 46 and 1 week's for continued hyperemesis status post Dobbhoff tube at home.  She had been seen previously in the MAU a couple of times because she had vomited the Dobbhoff tube up and that was replaced.  She was at home, however, on the day of presentation, she was having quite a bit of nausea and some vomiting.  She was previously admitted several times.  Most recently, she was admitted January 23 and discharged home on January 31.  ADMISSION DIAGNOSES: 1. Intrauterine pregnancy at 16 weeks and 1 day. 2. Hyperemesis gravidarum. 3. Hypokalemia.  DISCHARGE DIAGNOSES: 1. Viable intrauterine pregnancy at 17 weeks and 1 day. 2. Hyperemesis gravidarum stable.  DISCHARGE MEDICATIONS:  The patient was instructed to continue her home medications which include Robinul 1 tablet twice daily as needed, Zofran 8 mg every hours as needed, Protonix 40 mg daily, Phenergan 25 mg p.o. q.4 h. as needed and she will also be seen by home health and receive 1 liter of fluids every day at a rate of 150 mL an hour.  PERTINENT LABS DURING HER ADMISSION:  Negative ANA.  A hepatic function panel which was normal.  Complete metabolic panel which was also normal. A CBC which showed a hemoglobin of 9.7, hematocrit of 30.3, platelets of 314, and white count of 8.4.  A urinalysis which had positive nitrate in it at which time urine culture was ordered and is pending  at the time of this dictation.  The patient was given a dose of IV Rocephin.  She had acute hepatitis panel on February 4 which was negative for hepatitis B, A, and C.  A lipase was drawn and was normal at 24 on February 2.  Also, on February 2, amylase was drawn and was normal at 107.  Because her potassium was low on admission, a phosphorus was normal at 4.6. Magnesium was normal 1.9.  On the day of discharge, the patient also had her routine anatomy scan which showed a single intrauterine pregnancy EGA of ultrasound of 18 and 2, but by her last menstrual period it is 17 and 1.  There was noted to be  bilateral mild renal pyelectasis which is noted and considered to be a soft marker for Down syndrome with remaining visualized anatomy was normal.  There was a posterior marginal placental position and recommendation was made for ultrasound to be followed up at 28 weeks to suggests the quantitatively normal amniotic fluid volume and cervical length was noted.  The patient was questioned about getting a MSAFP.  She states  that on the day of admission she did get one in the clinic prior to being directly admitted to [facility].  At the time of this dictation, we are unable to obtain any point of references that was drawn because the clinic was closed and it is on the computer labs and I do to have access to.  A message was left to [place] at [facility] with a request to make sure that this blood test was drawn, if not, the patient will be called to the clinic on Monday to have this blood test drawn.  In addition, the patient will also be called Monday in order to arrange for her followup appointment next week as soon as hospital to ensure that she is still doing well with her weight and with her hyperemesis.  HOSPITAL COURSE:  This is a 25 year old gravida 2, para 1-0-0-1 with intrauterine pregnancy who presented with recurrent hyperemesis status post a recent discharge.   She was admitted with continued Dobbhoff feed several times during the admission, the Dobbhoff tube feeds, she vomited out.  There was a question whether or not the patient was removing the Dobbhoff tube.  GI Service was consulted for placement of a possible PEG tube versus TPN because the patient was losing weight and not tolerating Dobbhoff tube feeds.  GI came and talked to the patient and recommend against a PEG tube and recommended TPN.  The patient was agreeable to TPN but she also at some point was also agreeable to having a PEG tube placement.  However, 2 days prior to discharge, she started to eat on her own and on day of discharge, she had been eating small amounts of food keeping the majority of it down and was tolerating p.o. liquids quite well without Dobbhoff tube in place.  She continues to have this spitting and although this seems to be a normal occurrence to her, she feels well enough to go home and agrees with the plan.  DISPOSITION:  She is discharged home.  CONDITION:  Stable.  FOLLOWUP:  The patient will follow up in the [facility] according to the availability whenever she receives her call call on Monday. ER warning is for the patient is to return if any fever, chills, worsening nausea, or vomiting occur and she is to present to the MAU with these problems over the weekend or to the clinic if necessary if there are any other problems.  She is also told that if she has any contractions, bleeding, spotting, or decreased fetal movement which may suggest something is wrong with the pregnancy, she is also present acutely.  She voices understanding and agrees with all teaching and plans.          ______________________________ Lucina Mellow, DO     SH/MEDQ  D:  03/04/2010  T:  03/05/2010  Job:  [id number]  Electronically Signed by Lucina Mellow MD on 03/07/2010 05:10:47 PM

## 2010-03-08 LAB — AMYLASE: Amylase: 96 U/L (ref 0–105)

## 2010-03-11 ENCOUNTER — Inpatient Hospital Stay (HOSPITAL_COMMUNITY)
Admission: AD | Admit: 2010-03-11 | Discharge: 2010-03-12 | Disposition: A | Discharge status: Home / Self Care | Payer: Medicaid Other | Source: Ambulatory Visit | Attending: Obstetrics & Gynecology | Admitting: Obstetrics & Gynecology

## 2010-03-11 DIAGNOSIS — O210 Mild hyperemesis gravidarum: Secondary | ICD-10-CM

## 2010-03-11 LAB — URINE MICROSCOPIC-ADD ON

## 2010-03-11 LAB — URINALYSIS, ROUTINE W REFLEX MICROSCOPIC
Hgb urine dipstick: NEGATIVE
Ketones, ur: >80 mg/dL — AB
Protein, ur: 100 mg/dL — AB
Urobilinogen, UA: 2 mg/dL — ABNORMAL HIGH (ref 0.0–1.0)

## 2010-03-13 ENCOUNTER — Inpatient Hospital Stay (HOSPITAL_COMMUNITY)
Admission: AD | Admit: 2010-03-13 | Discharge: 2010-03-13 | Disposition: A | Discharge status: Home / Self Care | Payer: PRIVATE HEALTH INSURANCE | Source: Ambulatory Visit | Attending: Obstetrics & Gynecology | Admitting: Obstetrics & Gynecology

## 2010-03-13 DIAGNOSIS — O210 Mild hyperemesis gravidarum: Secondary | ICD-10-CM | POA: Insufficient documentation

## 2010-03-16 ENCOUNTER — Encounter: Payer: Self-pay | Admitting: Family Medicine

## 2010-03-16 DIAGNOSIS — R87610 Atypical squamous cells of undetermined significance on cytologic smear of cervix (ASC-US): Secondary | ICD-10-CM

## 2010-03-17 ENCOUNTER — Inpatient Hospital Stay (HOSPITAL_COMMUNITY): Payer: Medicaid Other

## 2010-03-17 ENCOUNTER — Inpatient Hospital Stay (HOSPITAL_COMMUNITY): Admission: AD | Admit: 2010-03-17 | Payer: Self-pay | Source: Ambulatory Visit | Admitting: Obstetrics & Gynecology

## 2010-03-17 ENCOUNTER — Ambulatory Visit (HOSPITAL_COMMUNITY)
Admission: RE | Admit: 2010-03-17 | Discharge: 2010-03-17 | Disposition: A | Discharge status: Home / Self Care | Payer: Medicaid Other | Source: Ambulatory Visit | Attending: Obstetrics & Gynecology | Admitting: Obstetrics & Gynecology

## 2010-03-17 ENCOUNTER — Encounter: Payer: Self-pay | Admitting: Obstetrics & Gynecology

## 2010-03-17 ENCOUNTER — Other Ambulatory Visit: Payer: Self-pay

## 2010-03-17 ENCOUNTER — Other Ambulatory Visit: Payer: Self-pay | Admitting: Obstetrics & Gynecology

## 2010-03-17 ENCOUNTER — Inpatient Hospital Stay (HOSPITAL_COMMUNITY)
Admission: AD | Admit: 2010-03-17 | Discharge: 2010-03-19 | DRG: 779 | Disposition: A | Discharge status: Home / Self Care | Payer: Medicaid Other | Source: Ambulatory Visit | Attending: Obstetrics & Gynecology | Admitting: Obstetrics & Gynecology

## 2010-03-17 DIAGNOSIS — O021 Missed abortion: Principal | ICD-10-CM | POA: Diagnosis present

## 2010-03-17 DIAGNOSIS — O211 Hyperemesis gravidarum with metabolic disturbance: Secondary | ICD-10-CM

## 2010-03-17 DIAGNOSIS — O337XX0 Maternal care for disproportion due to other fetal deformities, not applicable or unspecified: Secondary | ICD-10-CM | POA: Diagnosis present

## 2010-03-17 DIAGNOSIS — O2620 Pregnancy care for patient with recurrent pregnancy loss, unspecified trimester: Secondary | ICD-10-CM

## 2010-03-17 DIAGNOSIS — O358XX0 Maternal care for other (suspected) fetal abnormality and damage, not applicable or unspecified: Secondary | ICD-10-CM

## 2010-03-17 LAB — POCT URINALYSIS DIPSTICK
Ketones, ur: >=160 mg/dL — AB
Specific Gravity, Urine: >=1.03 (ref 1.005–1.030)
Urine Glucose, Fasting: NEGATIVE mg/dL
Urobilinogen, UA: 2 mg/dL — ABNORMAL HIGH (ref 0.0–1.0)

## 2010-03-17 IMAGING — US US AMNIOCENTESIS
1 series · 8 of 8 positions shown · non-contrast
Comparison: none

[Series 1: us amniocentesis · 8 of 8 slices shown]
[im 1/8]
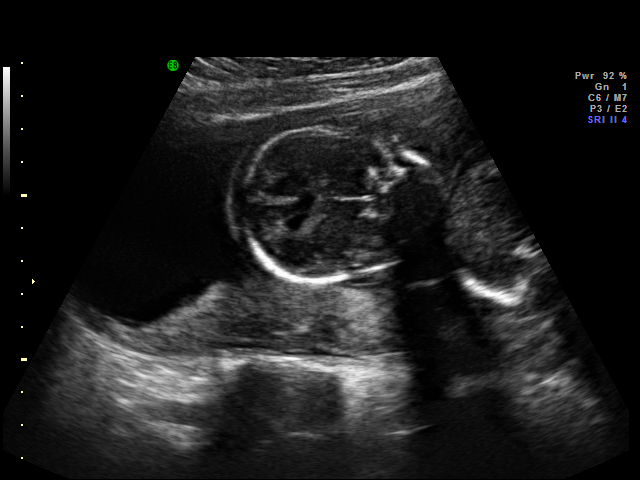
[im 2/8]
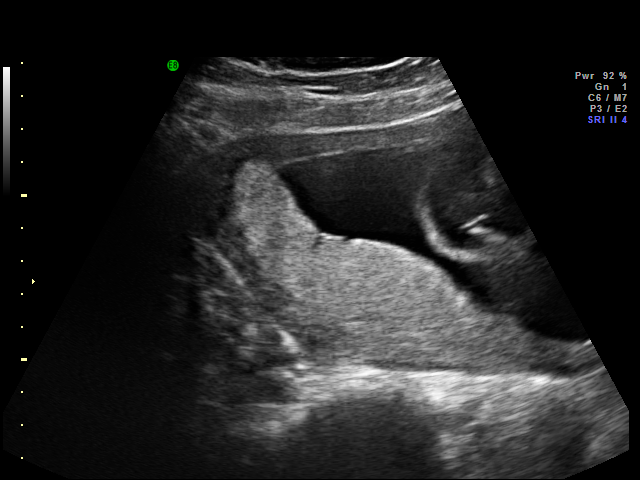
[im 3/8]
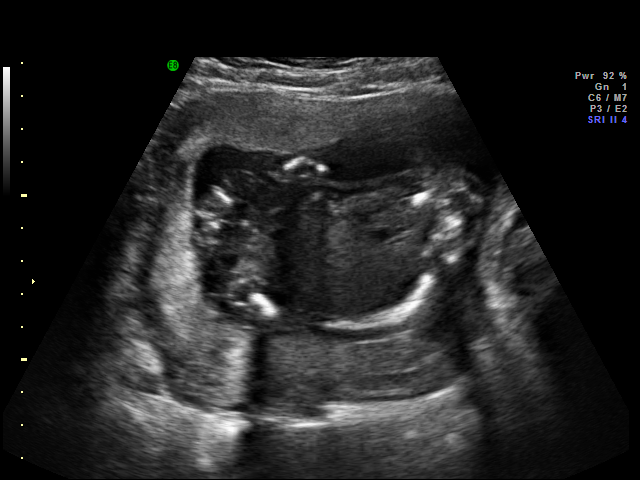
[im 4/8]
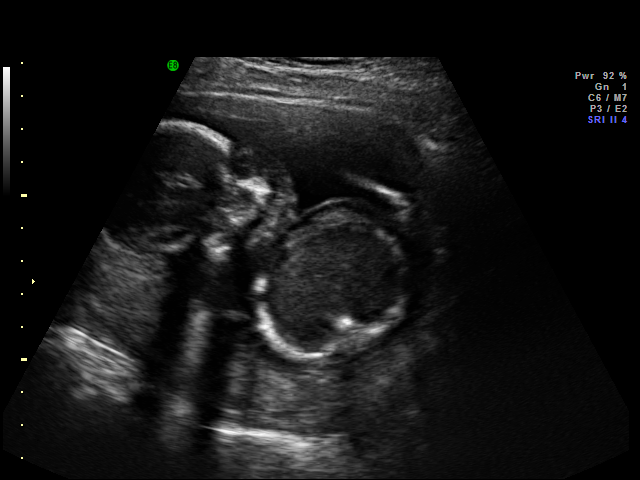
[im 5/8]
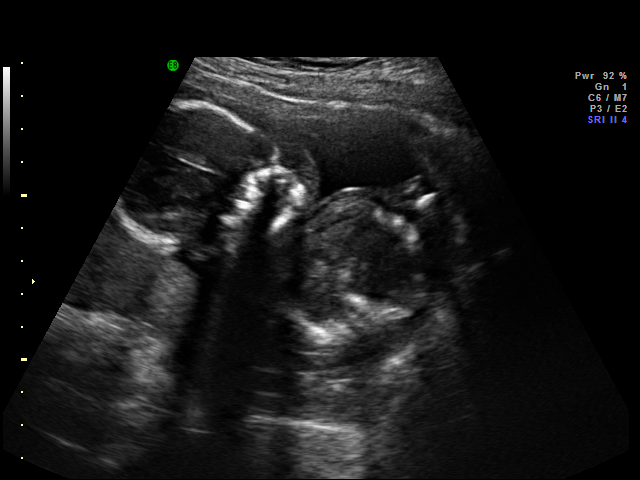
[im 6/8]
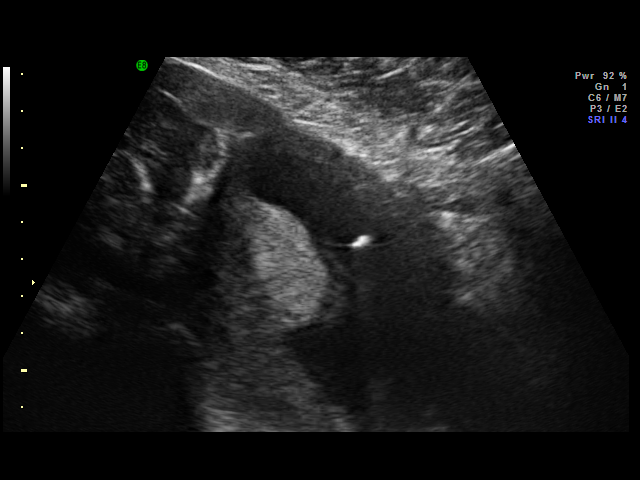
[im 7/8]
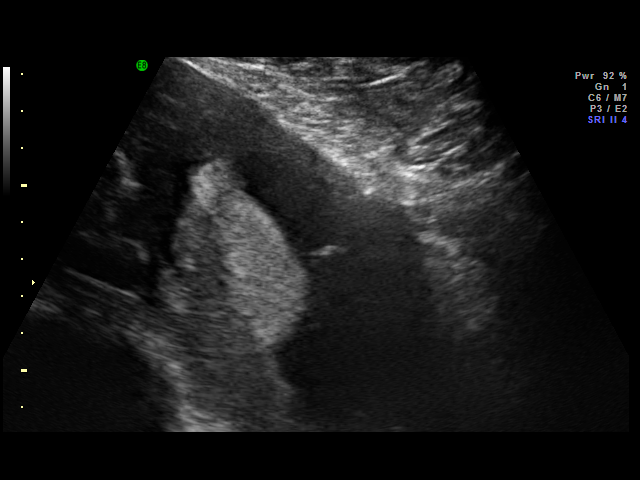
[im 8/8]
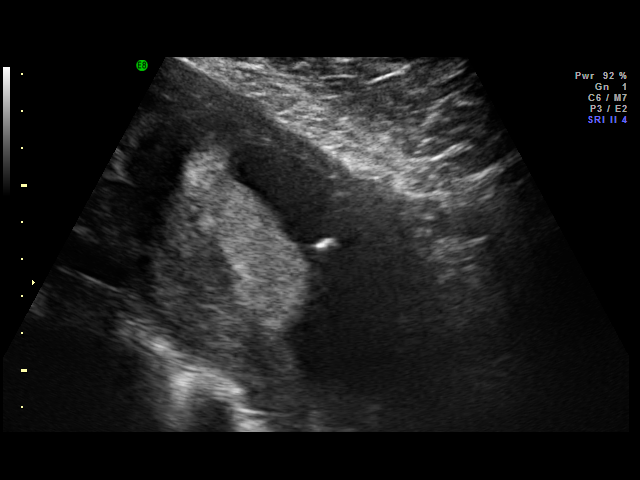

[8 of 8 positions shown; findings below may reference images not displayed]

Canned report from images found in remote index.

Refer to host system for actual result text.

## 2010-03-18 ENCOUNTER — Other Ambulatory Visit: Payer: Self-pay | Admitting: Obstetrics & Gynecology

## 2010-03-18 LAB — ANTIBODY SCREEN: Antibody Screen: NEGATIVE

## 2010-03-19 LAB — CBC
Hemoglobin: 9.8 g/dL — ABNORMAL LOW (ref 12.0–15.0)
MCH: 25.1 pg — ABNORMAL LOW (ref 26.0–34.0)
MCV: 74.9 fL — ABNORMAL LOW (ref 78.0–100.0)
RBC: 3.91 MIL/uL (ref 3.87–5.11)

## 2010-03-21 LAB — HSV(HERPES SMPLX)ABS-I+II(IGG+IGM)-BLD: Herpes Simplex Vrs I + II Ab, IgG: 0.3 IV

## 2010-03-21 LAB — TORCH-IGM(TOXO/ RUB/ CMV/ HSV) W TITER
CMV IgM: 0.22 Index (ref <0.90)
RPR Screen: NONREACTIVE

## 2010-03-24 DEATH — deceased

## 2010-03-27 NOTE — Discharge Summary (Signed)
  NAME:  Evelyn Marshall, Evelyn Marshall              ACCOUNT NO.:  [id number]  MEDICAL RECORD NO.:  [id number]           PATIENT TYPE:  I  LOCATION:  9303                          [facility]  PHYSICIAN:  Catalina Antigua, MD     DATE OF BIRTH:  [birth year]  DATE OF ADMISSION:  03/07/2010 DATE OF DISCHARGE:  03/10/2010                              DISCHARGE SUMMARY   This is a 25 year old gravida 2, para 1 at 18 weeks and 2 days, who presented on March 06, 2010, with complaints of generalized weakness and fatigue.  The patient has had multiple admissions for hyperemesis gravidarum and was recently discharged on March 04, 2010, with plan for home care, IV hydration.  The patient presented to the MAU with evidence of dehydration and she had ketones greater than 80.  The patient reported emesis while at home.  Decision was then made to admit the patient.  Throughout her stay, the patient received antiemetics through the IV and IV fluids.  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  The patient has not had any episodes of weight loss.  While she has been hospitalized, she maintained her weight of 82 kg.  No significant laboratory abnormalities were observed, as her albumin remained 3.2.  Because there has been no evidence of significant malnutrition, there is no immediate need to initiate TPN as previously recommended by a GI consultation.  The patient was then discharged with a diagnosis of ptylism.  Plan is to continue IV hydration and initiate a Reglan pump.  The patient was also initiated on a Medrol taper on the day of discharge, which she will continue for the next 14 days.  The patient was also advised to continue taking her Robinul and Protonix as well as Phenergan p.r.n.  The patient is discharged home in stable condition with plans to follow up in [facility] on Thursday, March 17, 2010.  The patient was advised to return to the MAU  if she experienced again generalized weakness or severe episodes of emesis refractory to all home interventions.     Catalina Antigua, MD     PC/MEDQ  D:  03/10/2010  T:  03/11/2010  Job:  [id number]  Electronically Signed by Catalina Antigua  on 03/27/2010 09:41:22 AM

## 2010-04-04 LAB — COMPREHENSIVE METABOLIC PANEL
ALT: 29 U/L (ref 0–35)
AST: 31 U/L (ref 0–37)
Albumin: 3.4 g/dL — ABNORMAL LOW (ref 3.5–5.2)
Albumin: 3.4 g/dL — ABNORMAL LOW (ref 3.5–5.2)
Albumin: 4.1 g/dL (ref 3.5–5.2)
Alkaline Phosphatase: 109 U/L (ref 39–117)
Alkaline Phosphatase: 88 U/L (ref 39–117)
Alkaline Phosphatase: 90 U/L (ref 39–117)
BUN: 2 mg/dL — ABNORMAL LOW (ref 6–23)
BUN: 4 mg/dL — ABNORMAL LOW (ref 6–23)
BUN: 8 mg/dL (ref 6–23)
CO2: 23 mEq/L (ref 19–32)
CO2: 24 mEq/L (ref 19–32)
Calcium: 9.7 mg/dL (ref 8.4–10.5)
Calcium: 9.9 mg/dL (ref 8.4–10.5)
Chloride: 98 mEq/L (ref 96–112)
Chloride: 98 mEq/L (ref 96–112)
Creatinine, Ser: 0.5 mg/dL (ref 0.4–1.2)
Creatinine, Ser: 0.61 mg/dL (ref 0.4–1.2)
GFR calc Af Amer: >60 mL/min (ref >60)
GFR calc non Af Amer: >60 mL/min (ref >60)
GFR calc non Af Amer: >60 mL/min (ref >60)
Glucose, Bld: 82 mg/dL (ref 70–99)
Glucose, Bld: 87 mg/dL (ref 70–99)
Glucose, Bld: 90 mg/dL (ref 70–99)
Potassium: 3.2 mEq/L — ABNORMAL LOW (ref 3.5–5.1)
Potassium: 3.3 mEq/L — ABNORMAL LOW (ref 3.5–5.1)
Sodium: 134 mEq/L — ABNORMAL LOW (ref 135–145)
Total Bilirubin: 0.8 mg/dL (ref 0.3–1.2)
Total Bilirubin: 2.4 mg/dL — ABNORMAL HIGH (ref 0.3–1.2)
Total Protein: 6.7 g/dL (ref 6.0–8.3)
Total Protein: 7.6 g/dL (ref 6.0–8.3)
Total Protein: 8.1 g/dL (ref 6.0–8.3)

## 2010-04-04 LAB — CBC
HCT: 39.3 % (ref 36.0–46.0)
MCH: 25.8 pg — ABNORMAL LOW (ref 26.0–34.0)
MCHC: 32.6 g/dL (ref 30.0–36.0)
MCV: 79.3 fL (ref 78.0–100.0)
RDW: 16.5 % — ABNORMAL HIGH (ref 11.5–15.5)

## 2010-04-04 LAB — URINALYSIS, ROUTINE W REFLEX MICROSCOPIC
Bilirubin Urine: NEGATIVE
Bilirubin Urine: NEGATIVE
Glucose, UA: NEGATIVE mg/dL
Hgb urine dipstick: NEGATIVE
Ketones, ur: 40 mg/dL — AB
Ketones, ur: >80 mg/dL — AB
Leukocytes, UA: NEGATIVE
Nitrite: NEGATIVE
Nitrite: NEGATIVE
Nitrite: NEGATIVE
Protein, ur: 100 mg/dL — AB
Protein, ur: NEGATIVE mg/dL
Protein, ur: NEGATIVE mg/dL
Specific Gravity, Urine: 1.01 (ref 1.005–1.030)
Specific Gravity, Urine: 1.015 (ref 1.005–1.030)
Urobilinogen, UA: 0.2 mg/dL (ref 0.0–1.0)
Urobilinogen, UA: 1 mg/dL (ref 0.0–1.0)
Urobilinogen, UA: 4 mg/dL — ABNORMAL HIGH (ref 0.0–1.0)
pH: 7 (ref 5.0–8.0)

## 2010-04-04 LAB — HEPATIC FUNCTION PANEL
ALT: 63 U/L — ABNORMAL HIGH (ref 0–35)
Alkaline Phosphatase: 86 U/L (ref 39–117)
Bilirubin, Direct: 0.9 mg/dL — ABNORMAL HIGH (ref 0.0–0.3)
Indirect Bilirubin: 1.2 mg/dL — ABNORMAL HIGH (ref 0.3–0.9)
Total Bilirubin: 2.1 mg/dL — ABNORMAL HIGH (ref 0.3–1.2)
Total Protein: 6.4 g/dL (ref 6.0–8.3)

## 2010-04-04 LAB — URINE CULTURE
Colony Count: NO GROWTH
Culture  Setup Time: 201112080249
Special Requests: NEGATIVE

## 2010-04-04 LAB — URINE MICROSCOPIC-ADD ON

## 2010-04-04 LAB — POTASSIUM
Potassium: 3.1 mEq/L — ABNORMAL LOW (ref 3.5–5.1)
Potassium: 3.6 mEq/L (ref 3.5–5.1)

## 2010-04-04 LAB — AMYLASE: Amylase: 112 U/L — ABNORMAL HIGH (ref 0–105)

## 2010-04-04 LAB — PHOSPHORUS: Phosphorus: 2.4 mg/dL (ref 2.3–4.6)

## 2010-04-04 LAB — MAGNESIUM: Magnesium: 1.8 mg/dL (ref 1.5–2.5)

## 2010-04-05 LAB — URINE MICROSCOPIC-ADD ON

## 2010-04-05 LAB — URINALYSIS, ROUTINE W REFLEX MICROSCOPIC
Glucose, UA: NEGATIVE mg/dL
Hgb urine dipstick: NEGATIVE
Nitrite: NEGATIVE
Specific Gravity, Urine: >1.03 — ABNORMAL HIGH (ref 1.005–1.030)
pH: 6 (ref 5.0–8.0)

## 2010-04-05 LAB — POCT PREGNANCY, URINE: Preg Test, Ur: POSITIVE

## 2010-04-05 LAB — CBC
MCV: 78.4 fL (ref 78.0–100.0)
Platelets: 329 10*3/uL (ref 150–400)
RDW: 15.8 % — ABNORMAL HIGH (ref 11.5–15.5)
WBC: 9 10*3/uL (ref 4.0–10.5)

## 2010-04-05 LAB — HCG, QUANTITATIVE, PREGNANCY: hCG, Beta Chain, Quant, S: 89724 m[IU]/mL — ABNORMAL HIGH (ref <5)

## 2010-04-18 ENCOUNTER — Ambulatory Visit (INDEPENDENT_AMBULATORY_CARE_PROVIDER_SITE_OTHER): Payer: Self-pay | Admitting: Obstetrics and Gynecology

## 2010-04-18 ENCOUNTER — Other Ambulatory Visit: Payer: Self-pay | Admitting: Obstetrics and Gynecology

## 2010-04-19 NOTE — Progress Notes (Signed)
NAME:  Evelyn Marshall, Evelyn Marshall NO.:  [id number]  MEDICAL RECORD NO.:  [id number]           PATIENT TYPE:  A  LOCATION:  [facility]                   [facility]  PHYSICIAN:  Catalina Antigua, MD     DATE OF BIRTH:  1985/08/07  DATE OF SERVICE:                                 CLINIC NOTE  This is a 25 year old G2, P 1-0-1-1, who is status post IUFD at 63 weeks and delivery on March 19, 2010, who presents today for postpartum check.  The patient is currently without any complaints and doing well. Denies any vaginal bleeding as she received Depo-Provera on discharge from the hospital on 2010/04/16.  The patient is also interested in initiating Mirena IUD for contraception.  Patient information was provided.  The patient verbalized understanding.  The patient also has results of the pathology were also reviewed which were essentially normal along with the cytogenetic.  The patient also has a history of ASCUS Pap positive HPV in January 2012 followed by a normal colposcopy examination.  Repeat Pap smear was performed today.  The patient will be contacted in approximately 2 weeks with the results, and the patient will schedule her Mirena IUD insertion appointment pending the results of the Pap smear.  The patient is otherwise medically cleared to resume all activities of daily living.          ______________________________ Catalina Antigua, MD    PC/MEDQ  D:  04/18/2010  T:  04/19/2010  Job:  [id number]

## 2010-05-09 LAB — POCT URINALYSIS DIP (DEVICE)
Protein, ur: 100 mg/dL — AB
Specific Gravity, Urine: 1.02 (ref 1.005–1.030)
Urobilinogen, UA: 1 mg/dL (ref 0.0–1.0)

## 2010-05-09 LAB — POCT PREGNANCY, URINE: Preg Test, Ur: NEGATIVE

## 2010-05-26 ENCOUNTER — Other Ambulatory Visit: Payer: Self-pay | Admitting: Obstetrics and Gynecology

## 2010-05-26 ENCOUNTER — Other Ambulatory Visit (HOSPITAL_COMMUNITY)
Admission: RE | Admit: 2010-05-26 | Discharge: 2010-05-26 | Disposition: A | Discharge status: Home / Self Care | Payer: Self-pay | Source: Ambulatory Visit | Attending: Obstetrics & Gynecology | Admitting: Obstetrics & Gynecology

## 2010-05-26 ENCOUNTER — Ambulatory Visit (INDEPENDENT_AMBULATORY_CARE_PROVIDER_SITE_OTHER): Payer: Medicaid Other | Admitting: Obstetrics and Gynecology

## 2010-05-26 DIAGNOSIS — R87619 Unspecified abnormal cytological findings in specimens from cervix uteri: Secondary | ICD-10-CM | POA: Insufficient documentation

## 2010-05-26 DIAGNOSIS — R87610 Atypical squamous cells of undetermined significance on cytologic smear of cervix (ASC-US): Secondary | ICD-10-CM

## 2010-05-26 LAB — POCT PREGNANCY, URINE: Preg Test, Ur: NEGATIVE
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
## 2010-06-13 ENCOUNTER — Ambulatory Visit (INDEPENDENT_AMBULATORY_CARE_PROVIDER_SITE_OTHER): Payer: Medicaid Other

## 2010-06-13 ENCOUNTER — Other Ambulatory Visit: Payer: Self-pay | Admitting: Obstetrics & Gynecology

## 2010-06-13 DIAGNOSIS — Z3049 Encounter for surveillance of other contraceptives: Secondary | ICD-10-CM

## 2010-06-23 ENCOUNTER — Ambulatory Visit (INDEPENDENT_AMBULATORY_CARE_PROVIDER_SITE_OTHER): Payer: Self-pay | Admitting: Obstetrics and Gynecology

## 2010-06-23 DIAGNOSIS — R87610 Atypical squamous cells of undetermined significance on cytologic smear of cervix (ASC-US): Secondary | ICD-10-CM

## 2010-06-24 NOTE — Group Therapy Note (Signed)
NAME:  Evelyn Marshall, Evelyn Marshall NO.:  [id number]  MEDICAL RECORD NO.:  [id number]           PATIENT TYPE:  A  [facility]ATION:  [facility]                   [facility]  PHYSICIAN:  Catalina Antigua, MD     DATE OF BIRTH:  07/02/1985  DATE OF SERVICE:  06/23/2010                                 CLINIC NOTE  This is a 25 year old G2, P 1-0-1-1 with history of abnormal Pap smear ASCUS, positive HPV followed by colposcopy on May 26, 2010, who presents today to discuss the results of the colposcopy.  Results were reviewed with the patient, which showed cervical mucosa with koilocytic atypia consistent with HPV effect.  Plan is to repeat Pap smear in 6 months. The patient verbalized understanding and all questions were answered.          ______________________________ Catalina Antigua, MD    PC/MEDQ  D:  06/23/2010  T:  06/24/2010  Job:  [id number]

## 2010-08-29 ENCOUNTER — Ambulatory Visit: Payer: Medicaid Other

## 2010-08-31 ENCOUNTER — Ambulatory Visit (INDEPENDENT_AMBULATORY_CARE_PROVIDER_SITE_OTHER): Payer: Self-pay | Admitting: Obstetrics and Gynecology

## 2010-08-31 VITALS — BP 131/78 | HR 85

## 2010-08-31 DIAGNOSIS — Z309 Encounter for contraceptive management, unspecified: Secondary | ICD-10-CM

## 2010-08-31 MED ORDER — MEDROXYPROGESTERONE ACETATE 150 MG/ML IM SUSP
150.0000 mg | Freq: Once | INTRAMUSCULAR | Status: AC
  Administered 2010-08-31: 150 mg via INTRAMUSCULAR

## 2010-11-16 ENCOUNTER — Ambulatory Visit: Payer: Self-pay

## 2012-11-28 ENCOUNTER — Emergency Department (HOSPITAL_COMMUNITY): Payer: No Typology Code available for payment source

## 2012-11-28 ENCOUNTER — Emergency Department (HOSPITAL_COMMUNITY)
Admission: EM | Admit: 2012-11-28 | Discharge: 2012-11-28 | Disposition: A | Discharge status: Home / Self Care | Payer: No Typology Code available for payment source | Attending: Emergency Medicine | Admitting: Emergency Medicine

## 2012-11-28 ENCOUNTER — Encounter (HOSPITAL_COMMUNITY): Payer: Self-pay | Admitting: Emergency Medicine

## 2012-11-28 DIAGNOSIS — IMO0002 Reserved for concepts with insufficient information to code with codable children: Secondary | ICD-10-CM | POA: Insufficient documentation

## 2012-11-28 DIAGNOSIS — Y92410 Unspecified street and highway as the place of occurrence of the external cause: Secondary | ICD-10-CM | POA: Insufficient documentation

## 2012-11-28 DIAGNOSIS — Y9389 Activity, other specified: Secondary | ICD-10-CM | POA: Insufficient documentation

## 2012-11-28 DIAGNOSIS — M549 Dorsalgia, unspecified: Secondary | ICD-10-CM

## 2012-11-28 DIAGNOSIS — Z3202 Encounter for pregnancy test, result negative: Secondary | ICD-10-CM | POA: Insufficient documentation
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
## 2012-11-30 NOTE — ED Provider Notes (Signed)
Medical screening examination/treatment/procedure(s) were performed by non-physician practitioner and as supervising physician I was immediately available for consultation/collaboration.  Johnmatthew Solorio L Uel Davidow, MD 11/30/12 1632 

## 2012-12-01 ENCOUNTER — Encounter (HOSPITAL_COMMUNITY): Payer: Self-pay | Admitting: Emergency Medicine

## 2012-12-01 ENCOUNTER — Emergency Department (HOSPITAL_COMMUNITY)
Admission: EM | Admit: 2012-12-01 | Discharge: 2012-12-01 | Disposition: A | Discharge status: Home / Self Care | Payer: No Typology Code available for payment source | Attending: Emergency Medicine | Admitting: Emergency Medicine

## 2012-12-01 ENCOUNTER — Emergency Department (HOSPITAL_COMMUNITY): Payer: No Typology Code available for payment source

## 2012-12-01 DIAGNOSIS — G8911 Acute pain due to trauma: Secondary | ICD-10-CM | POA: Insufficient documentation

## 2012-12-01 DIAGNOSIS — R110 Nausea: Secondary | ICD-10-CM | POA: Insufficient documentation

## 2012-12-01 DIAGNOSIS — Y92410 Unspecified street and highway as the place of occurrence of the external cause: Secondary | ICD-10-CM | POA: Insufficient documentation

## 2012-12-01 DIAGNOSIS — R42 Dizziness and giddiness: Secondary | ICD-10-CM | POA: Insufficient documentation

## 2012-12-01 DIAGNOSIS — S060X0A Concussion without loss of consciousness, initial encounter: Secondary | ICD-10-CM | POA: Insufficient documentation

## 2012-12-01 DIAGNOSIS — M545 Low back pain, unspecified: Secondary | ICD-10-CM | POA: Insufficient documentation

## 2012-12-01 DIAGNOSIS — Y9389 Activity, other specified: Secondary | ICD-10-CM | POA: Insufficient documentation
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
## 2012-12-01 NOTE — ED Notes (Signed)
Pt states blurred vision and pressure. Denies photophobia. Pt states increase in activity increases pain.

## 2013-11-24 ENCOUNTER — Encounter (HOSPITAL_COMMUNITY): Payer: Self-pay | Admitting: Emergency Medicine

## 2015-01-24 NOTE — L&D Delivery Note (Signed)
Delivery Note  At 4:29 PM a viable and healthy female ([place]) was delivered via  (Presentation:OA).  APGAR:9 ,9 ; weight  .   Placenta status: , .  3 vessel Cord:  with the following complications: .  none Anesthesia:  Epidural Episiotomy:  None Lacerations:  Second Suture Repair: 2.0 vicryl Est. Blood Loss (mL):  300  Mom to AICU for magnesium for 24 hours.  Baby to Couplet care / Skin to Skin.  Eli Hose 11/15/2015, 4:47 PM

## 2015-04-16 ENCOUNTER — Encounter (HOSPITAL_COMMUNITY): Payer: Self-pay | Admitting: *Deleted

## 2015-04-16 ENCOUNTER — Inpatient Hospital Stay (HOSPITAL_COMMUNITY)
Admission: AD | Admit: 2015-04-16 | Discharge: 2015-04-16 | Disposition: A | Discharge status: Home / Self Care | Payer: BC Managed Care – PPO | Source: Ambulatory Visit | Attending: Obstetrics and Gynecology | Admitting: Obstetrics and Gynecology

## 2015-04-16 DIAGNOSIS — Z3A01 Less than 8 weeks gestation of pregnancy: Secondary | ICD-10-CM | POA: Diagnosis not present

## 2015-04-16 DIAGNOSIS — O99611 Diseases of the digestive system complicating pregnancy, first trimester: Secondary | ICD-10-CM | POA: Insufficient documentation

## 2015-04-16 DIAGNOSIS — K117 Disturbances of salivary secretion: Secondary | ICD-10-CM | POA: Diagnosis not present

## 2015-04-16 DIAGNOSIS — O219 Vomiting of pregnancy, unspecified: Secondary | ICD-10-CM

## 2015-04-16 DIAGNOSIS — O210 Mild hyperemesis gravidarum: Secondary | ICD-10-CM | POA: Diagnosis not present

## 2015-04-16 LAB — URINE MICROSCOPIC-ADD ON

## 2015-04-16 LAB — URINALYSIS, ROUTINE W REFLEX MICROSCOPIC
GLUCOSE, UA: NEGATIVE mg/dL
Ketones, ur: >80 mg/dL — AB
LEUKOCYTES UA: NEGATIVE
NITRITE: NEGATIVE
PH: 6 (ref 5.0–8.0)
Protein, ur: 100 mg/dL — AB
SPECIFIC GRAVITY, URINE: 1.025 (ref 1.005–1.030)

## 2015-04-16 MED ORDER — PANTOPRAZOLE SODIUM 40 MG PO TBEC: 40.0000 mg | DELAYED_RELEASE_TABLET | Freq: Every day | ORAL | Status: DC

## 2015-04-16 MED ORDER — DOXYLAMINE-PYRIDOXINE 10-10 MG PO TBEC: DELAYED_RELEASE_TABLET | ORAL | Status: DC

## 2015-04-16 MED ORDER — LACTATED RINGERS IV BOLUS (SEPSIS)
500.0000 mL | Freq: Once | INTRAVENOUS | Status: AC
  Administered 2015-04-16: 500 mL via INTRAVENOUS

## 2015-04-16 MED ORDER — LACTATED RINGERS IV SOLN
INTRAVENOUS | Status: DC
  Administered 2015-04-16: 21:00:00 via INTRAVENOUS

## 2015-04-16 MED ORDER — FAMOTIDINE IN NACL 20-0.9 MG/50ML-% IV SOLN
20.0000 mg | Freq: Once | INTRAVENOUS | Status: AC
  Administered 2015-04-16: 20 mg via INTRAVENOUS
  Filled 2015-04-16: qty 50

## 2015-04-16 MED ORDER — METOCLOPRAMIDE HCL 10 MG PO TABS: 10.0000 mg | ORAL_TABLET | Freq: Four times a day (QID) | ORAL | Status: DC | PRN

## 2015-04-16 MED ORDER — PROMETHAZINE HCL 25 MG/ML IJ SOLN
25.0000 mg | Freq: Once | INTRAVENOUS | Status: AC
  Administered 2015-04-16: 25 mg via INTRAVENOUS
  Filled 2015-04-16: qty 1

## 2015-04-16 MED ORDER — GLYCOPYRROLATE 1 MG PO TABS: 1.0000 mg | ORAL_TABLET | Freq: Three times a day (TID) | ORAL | Status: DC | PRN

## 2015-04-16 NOTE — Discharge Instructions (Signed)
Hyperemesis Gravidarum Hyperemesis gravidarum is a severe form of nausea and vomiting that happens during pregnancy. Hyperemesis is worse than morning sickness. It may cause you to have nausea or vomiting all day for many days. It may keep you from eating and drinking enough food and liquids. Hyperemesis usually occurs during the first half (the first 20 weeks) of pregnancy. It often goes away once a woman is in her second half of pregnancy. However, sometimes hyperemesis continues through an entire pregnancy.  CAUSES  The cause of this condition is not completely known but is thought to be related to changes in the body's hormones when pregnant. It could be from the high level of the pregnancy hormone or an increase in estrogen in the body.  SIGNS AND SYMPTOMS   Severe nausea and vomiting.  Nausea that does not go away.  Vomiting that does not allow you to keep any food down.  Weight loss and body fluid loss (dehydration).  Having no desire to eat or not liking food you have previously enjoyed. DIAGNOSIS  Your health care provider will do a physical exam and ask you about your symptoms. He or she may also order blood tests and urine tests to make sure something else is not causing the problem.  TREATMENT  You may only need medicine to control the problem. If medicines do not control the nausea and vomiting, you will be treated in the hospital to prevent dehydration, increased acid in the blood (acidosis), weight loss, and changes in the electrolytes in your body that may harm the unborn baby (fetus). You may need IV fluids.  HOME CARE INSTRUCTIONS   Only take over-the-counter or prescription medicines as directed by your health care provider.  Try eating a couple of dry crackers or toast in the morning before getting out of bed.  Avoid foods and smells that upset your stomach.  Avoid fatty and spicy foods.  Eat 5-6 small meals a day.  Do not drink when eating meals. Drink between  meals.  For snacks, eat high-protein foods, such as cheese.  Eat or suck on things that have ginger in them. Ginger helps nausea.  Avoid food preparation. The smell of food can spoil your appetite.  Avoid iron pills and iron in your multivitamins until after 3-4 months of being pregnant. However, consult with your health care provider before stopping any prescribed iron pills. SEEK MEDICAL CARE IF:   Your abdominal pain increases.  You have a severe headache.  You have vision problems.  You are losing weight. SEEK IMMEDIATE MEDICAL CARE IF:   You are unable to keep fluids down.  You vomit blood.  You have constant nausea and vomiting.  You have excessive weakness.  You have extreme thirst.  You have dizziness or fainting.  You have a fever or persistent symptoms for more than 2-3 days.  You have a fever and your symptoms suddenly get worse. MAKE SURE YOU:   Understand these instructions.  Will watch your condition.  Will get help right away if you are not doing well or get worse.   This information is not intended to replace advice given to you by your health care provider. Make sure you discuss any questions you have with your health care provider.   Document Released: 01/09/2005 Document Revised: 10/30/2012 Document Reviewed: 08/21/2012 Elsevier Interactive Patient Education 2016 [employer] for Gastroesophageal Reflux Disease, Adult When you have gastroesophageal reflux disease (GERD), the foods you eat and your eating habits  are very important. Choosing the right foods can help ease the discomfort of GERD. WHAT GENERAL GUIDELINES DO I NEED TO FOLLOW?  Choose fruits, vegetables, whole grains, low-fat dairy products, and low-fat meat, fish, and poultry.  Limit fats such as oils, salad dressings, butter, nuts, and avocado.  Keep a food diary to identify foods that cause symptoms.  Avoid foods that cause reflux. These may be different for  different people.  Eat frequent small meals instead of three large meals each day.  Eat your meals slowly, in a relaxed setting.  Limit fried foods.  Cook foods using methods other than frying.  Avoid drinking alcohol.  Avoid drinking large amounts of liquids with your meals.  Avoid bending over or lying down until 2-3 hours after eating. WHAT FOODS ARE NOT RECOMMENDED? The following are some foods and drinks that may worsen your symptoms: Vegetables Tomatoes. Tomato juice. Tomato and spaghetti sauce. Chili peppers. Onion and garlic. Horseradish. Fruits Oranges, grapefruit, and lemon (fruit and juice). Meats High-fat meats, fish, and poultry. This includes hot dogs, ribs, ham, sausage, salami, and bacon. Dairy Whole milk and chocolate milk. Sour cream. Cream. Butter. Ice cream. Cream cheese.  Beverages Coffee and tea, with or without caffeine. Carbonated beverages or energy drinks. Condiments Hot sauce. Barbecue sauce.  Sweets/Desserts Chocolate and cocoa. Donuts. Peppermint and spearmint. Fats and Oils High-fat foods, including [place] fries and potato chips. Other Vinegar. Strong spices, such as Fare pepper, white pepper, red pepper, cayenne, curry powder, cloves, ginger, and chili powder. The items listed above may not be a complete list of foods and beverages to avoid. Contact your dietitian for more information.   This information is not intended to replace advice given to you by your health care provider. Make sure you discuss any questions you have with your health care provider.   Document Released: 01/09/2005 Document Revised: 01/30/2014 Document Reviewed: 11/13/2012 Elsevier Interactive Patient Education [employer].

## 2015-04-16 NOTE — MAU Note (Signed)
History    Evelyn Marshall is a 30 y.o. G3P1101 at 6.4wks by LMP who presents, unannounced, for n/v and spitting.  Patient states symptoms have been ongoing for about 2 weeks and have increased in intensity within the last 24 hours.  Patient reports taking mylanta for heartburn with good relief of symptoms.  Patient reports vomiting prior to coming. Patient reports taking robinul, zofran, phenergan, and reglan for nausea and vomiting.  She also reports having a reglan pump and feeding tube.  Patient also reports taking pepcid for heartburn.  Patient admits that this is a regular occurrence in pregnancy. Reports bowel movements and denies issues with urination.    There are no active problems to display for this patient.   Chief Complaint  Patient presents with  . Morning Sickness  . Ptyalism   HPI  OB History    Gravida Para Term Preterm AB TAB SAB Ectopic Multiple Living   3 2 1 1  0  0   1      Past Medical History  Diagnosis Date  . Abnormal Pap smear 2012    colpo result benign    Past Surgical History  Procedure Laterality Date  . Wisdom tooth extraction      No family history on file.  Social History  Substance Use Topics  . Smoking status: Never Smoker   . Smokeless tobacco: Never Used  . Alcohol Use: No    Allergies: No Known Allergies  Prescriptions prior to admission  Medication Sig Dispense Refill Last Dose  . alum & mag hydroxide-simeth (MAALOX/MYLANTA) 200-200-20 MG/5ML suspension Take 15 mLs by mouth every 6 (six) hours as needed for indigestion or heartburn.   04/15/2015 at Unknown time  . Prenatal Vit-Fe Fumarate-FA (PRENATAL MULTIVITAMIN) TABS tablet Take 1 tablet by mouth daily at 12 noon.   Past Week at Unknown time    ROS  See HPI Above Physical Exam   Blood pressure 132/70, pulse 89, temperature 97.8 F (36.6 C), temperature source Oral, resp. rate 16, height 5\' 4"  (1.626 m), weight 115.486 kg (254 lb 9.6 oz), last menstrual period 03/01/2015,  SpO2 100 %.  Results for orders placed or performed during the hospital encounter of 04/16/15 (from the past 24 hour(s))  Urinalysis, Routine w reflex microscopic (not at [facility])     Status: Abnormal   Collection Time: 04/16/15  6:25 PM  Result Value Ref Range   Color, Urine AMBER (A) YELLOW   APPearance CLEAR CLEAR   Specific Gravity, Urine 1.025 1.005 - 1.030   pH 6.0 5.0 - 8.0   Glucose, UA NEGATIVE NEGATIVE mg/dL   Hgb urine dipstick TRACE (A) NEGATIVE   Bilirubin Urine SMALL (A) NEGATIVE   Ketones, ur >80 (A) NEGATIVE mg/dL   Protein, ur 100 (A) NEGATIVE mg/dL   Nitrite NEGATIVE NEGATIVE   Leukocytes, UA NEGATIVE NEGATIVE  Urine microscopic-add on     Status: Abnormal   Collection Time: 04/16/15  6:25 PM  Result Value Ref Range   Squamous Epithelial / LPF 6-30 (A) NONE SEEN   WBC, UA 0-5 0 - 5 WBC/hpf   RBC / HPF 0-5 0 - 5 RBC/hpf   Bacteria, UA MANY (A) NONE SEEN   Urine-Other MUCOUS PRESENT     Physical Exam  Vitals reviewed. Constitutional: She is oriented to person, place, and time. She appears well-developed. No distress.  HENT:  Head: Normocephalic and atraumatic.  Eyes: Conjunctivae are normal.  Neck: Normal range of motion.  Cardiovascular: Normal  rate, regular rhythm and normal heart sounds.   Respiratory: Effort normal and breath sounds normal.  GI: Soft. Bowel sounds are normal.  Musculoskeletal: Normal range of motion.  Neurological: She is alert and oriented to person, place, and time.  Skin: Skin is warm and dry.     ED Course  Assessment: IUP at 6.4wks Nausea & Vomiting Ptyalism  Plan: -UA: + ketones -Start IV -LR Bolus f/b infusion -IV Pepcid -IV Phenergan -RX sent to pharmacy: +Diclegis-Start tonight +Robinul 1mg  TID prn +Protonix 40mg  daily -Instructed to have significant other obtain prescriptions prior to pharmacy close tonight -Education regarding bland diet with 2-3 small meals and frequent protein rich snacks throughout the  day -Informed okay for protein shakes such as boost, ensure, or breakfast essentials -Will reassess after fluids and medications given -Will not attempt PO challenge  Follow Up (2140) -Reports feeling better with IV fluid and meds -States Diclegis was over $300 after insurance -Rx for Reglan sent to pharmacy -Educated on B6 supplementation may take one 50-100mg  tablet daily -Informed that she can obtain Diclegis samples from [place] office if she desires -No q/c -Will await for liter of fluid to stop -Keep appt as scheduled: 05/17/2015 -Encouraged to call if any questions or concerns arise prior to next scheduled office visit.  -Discharge to home in improved condition  [place], MSN 04/16/2015 7:40 PM

## 2015-04-16 NOTE — MAU Note (Signed)
Patient has been having nausea and vomiting for past two weeks and "severe spitting" for past few days.  Hx of hyperemesis and spitting in other pregnancies.  Denies pain or vaginal bleeding.

## 2015-04-22 ENCOUNTER — Inpatient Hospital Stay (HOSPITAL_COMMUNITY)
Admission: AD | Admit: 2015-04-22 | Discharge: 2015-04-22 | Disposition: A | Discharge status: Home / Self Care | Payer: BC Managed Care – PPO | Source: Ambulatory Visit | Attending: Obstetrics and Gynecology | Admitting: Obstetrics and Gynecology

## 2015-04-22 ENCOUNTER — Encounter (HOSPITAL_COMMUNITY): Payer: Self-pay

## 2015-04-22 DIAGNOSIS — R51 Headache: Secondary | ICD-10-CM | POA: Insufficient documentation

## 2015-04-22 DIAGNOSIS — R748 Abnormal levels of other serum enzymes: Secondary | ICD-10-CM

## 2015-04-22 DIAGNOSIS — Z79899 Other long term (current) drug therapy: Secondary | ICD-10-CM | POA: Diagnosis not present

## 2015-04-22 DIAGNOSIS — Z3A01 Less than 8 weeks gestation of pregnancy: Secondary | ICD-10-CM | POA: Insufficient documentation

## 2015-04-22 DIAGNOSIS — R112 Nausea with vomiting, unspecified: Secondary | ICD-10-CM | POA: Diagnosis present

## 2015-04-22 DIAGNOSIS — O211 Hyperemesis gravidarum with metabolic disturbance: Secondary | ICD-10-CM

## 2015-04-22 LAB — CBC WITH DIFFERENTIAL/PLATELET
Basophils Absolute: 0 10*3/uL (ref 0.0–0.1)
Basophils Relative: 0 %
EOS ABS: 0 10*3/uL (ref 0.0–0.7)
EOS PCT: 0 %
HCT: 31.3 % — ABNORMAL LOW (ref 36.0–46.0)
Hemoglobin: 10.2 g/dL — ABNORMAL LOW (ref 12.0–15.0)
LYMPHS ABS: 2.2 10*3/uL (ref 0.7–4.0)
Lymphocytes Relative: 31 %
MCH: 23.6 pg — AB (ref 26.0–34.0)
MCHC: 32.6 g/dL (ref 30.0–36.0)
MCV: 72.5 fL — AB (ref 78.0–100.0)
MONO ABS: 0.4 10*3/uL (ref 0.1–1.0)
MONOS PCT: 5 %
Neutro Abs: 4.5 10*3/uL (ref 1.7–7.7)
Neutrophils Relative %: 64 %
PLATELETS: 343 10*3/uL (ref 150–400)
RBC: 4.32 MIL/uL (ref 3.87–5.11)
RDW: 16.2 % — ABNORMAL HIGH (ref 11.5–15.5)
WBC: 7.1 10*3/uL (ref 4.0–10.5)

## 2015-04-22 LAB — URINALYSIS, ROUTINE W REFLEX MICROSCOPIC
Bilirubin Urine: NEGATIVE
Glucose, UA: NEGATIVE mg/dL
Glucose, UA: NEGATIVE mg/dL
Hgb urine dipstick: NEGATIVE
KETONES UR: 40 mg/dL — AB
LEUKOCYTES UA: NEGATIVE
NITRITE: NEGATIVE
NITRITE: NEGATIVE
PH: 6 (ref 5.0–8.0)
PH: 8 (ref 5.0–8.0)
PROTEIN: 100 mg/dL — AB
PROTEIN: 30 mg/dL — AB
Specific Gravity, Urine: >1.03 — ABNORMAL HIGH (ref 1.005–1.030)
Specific Gravity, Urine: 1.015 (ref 1.005–1.030)

## 2015-04-22 LAB — COMPREHENSIVE METABOLIC PANEL
ALT: 68 U/L — ABNORMAL HIGH (ref 14–54)
ANION GAP: 10 (ref 5–15)
AST: 53 U/L — ABNORMAL HIGH (ref 15–41)
Albumin: 3.6 g/dL (ref 3.5–5.0)
Alkaline Phosphatase: 103 U/L (ref 38–126)
BUN: 7 mg/dL (ref 6–20)
CHLORIDE: 100 mmol/L — AB (ref 101–111)
CO2: 25 mmol/L (ref 22–32)
Calcium: 8.7 mg/dL — ABNORMAL LOW (ref 8.9–10.3)
Creatinine, Ser: 0.69 mg/dL (ref 0.44–1.00)
Glucose, Bld: 88 mg/dL (ref 65–99)
Potassium: 3.4 mmol/L — ABNORMAL LOW (ref 3.5–5.1)
SODIUM: 135 mmol/L (ref 135–145)
Total Bilirubin: 0.6 mg/dL (ref 0.3–1.2)
Total Protein: 7.8 g/dL (ref 6.5–8.1)

## 2015-04-22 LAB — URINE MICROSCOPIC-ADD ON
RBC / HPF: NONE SEEN RBC/hpf (ref 0–5)
RBC / HPF: NONE SEEN RBC/hpf (ref 0–5)

## 2015-04-22 LAB — AMYLASE: AMYLASE: 183 U/L — AB (ref 28–100)

## 2015-04-22 LAB — LIPASE, BLOOD: Lipase: 15 U/L (ref 11–51)

## 2015-04-22 MED ORDER — LACTATED RINGERS IV BOLUS (SEPSIS)
1000.0000 mL | Freq: Once | INTRAVENOUS | Status: AC
  Administered 2015-04-22: 1000 mL via INTRAVENOUS

## 2015-04-22 MED ORDER — PROMETHAZINE HCL 25 MG/ML IJ SOLN
12.5000 mg | Freq: Once | INTRAVENOUS | Status: AC
  Administered 2015-04-22: 12.5 mg via INTRAVENOUS
  Filled 2015-04-22: qty 0.5

## 2015-04-22 MED ORDER — PROMETHAZINE HCL 25 MG/ML IJ SOLN: 12.5000 mg | Freq: Once | INTRAVENOUS | Status: DC

## 2015-04-22 MED ORDER — ONDANSETRON HCL 4 MG/2ML IJ SOLN
4.0000 mg | Freq: Once | INTRAMUSCULAR | Status: AC
  Administered 2015-04-22: 4 mg via INTRAVENOUS
  Filled 2015-04-22: qty 2

## 2015-04-22 MED ORDER — PROMETHAZINE HCL 12.5 MG PO TABS: 12.5000 mg | ORAL_TABLET | Freq: Four times a day (QID) | ORAL | Status: DC | PRN

## 2015-04-22 NOTE — Discharge Instructions (Signed)
Hyperemesis Gravidarum Hyperemesis gravidarum is a severe form of nausea and vomiting that happens during pregnancy. Hyperemesis is worse than morning sickness. It may cause you to have nausea or vomiting all day for many days. It may keep you from eating and drinking enough food and liquids. Hyperemesis usually occurs during the first half (the first 20 weeks) of pregnancy. It often goes away once a woman is in her second half of pregnancy. However, sometimes hyperemesis continues through an entire pregnancy.  CAUSES  The cause of this condition is not completely known but is thought to be related to changes in the body's hormones when pregnant. It could be from the high level of the pregnancy hormone or an increase in estrogen in the body.  SIGNS AND SYMPTOMS   Severe nausea and vomiting.  Nausea that does not go away.  Vomiting that does not allow you to keep any food down.  Weight loss and body fluid loss (dehydration).  Having no desire to eat or not liking food you have previously enjoyed. DIAGNOSIS  Your health care provider will do a physical exam and ask you about your symptoms. He or she may also order blood tests and urine tests to make sure something else is not causing the problem.  TREATMENT  You may only need medicine to control the problem. If medicines do not control the nausea and vomiting, you will be treated in the hospital to prevent dehydration, increased acid in the blood (acidosis), weight loss, and changes in the electrolytes in your body that may harm the unborn baby (fetus). You may need IV fluids.  HOME CARE INSTRUCTIONS   Only take over-the-counter or prescription medicines as directed by your health care provider.  Try eating a couple of dry crackers or toast in the morning before getting out of bed.  Avoid foods and smells that upset your stomach.  Avoid fatty and spicy foods.  Eat 5-6 small meals a day.  Do not drink when eating meals. Drink between  meals.  For snacks, eat high-protein foods, such as cheese.  Eat or suck on things that have ginger in them. Ginger helps nausea.  Avoid food preparation. The smell of food can spoil your appetite.  Avoid iron pills and iron in your multivitamins until after 3-4 months of being pregnant. However, consult with your health care provider before stopping any prescribed iron pills. SEEK MEDICAL CARE IF:   Your abdominal pain increases.  You have a severe headache.  You have vision problems.  You are losing weight. SEEK IMMEDIATE MEDICAL CARE IF:   You are unable to keep fluids down.  You vomit blood.  You have constant nausea and vomiting.  You have excessive weakness.  You have extreme thirst.  You have dizziness or fainting.  You have a fever or persistent symptoms for more than 2-3 days.  You have a fever and your symptoms suddenly get worse. MAKE SURE YOU:   Understand these instructions.  Will watch your condition.  Will get help right away if you are not doing well or get worse.   This information is not intended to replace advice given to you by your health care provider. Make sure you discuss any questions you have with your health care provider.   Document Released: 01/09/2005 Document Revised: 10/30/2012 Document Reviewed: 08/21/2012 Elsevier Interactive Patient Education 2016 [place] for Hyperemesis Gravidarum Severe cases of hyperemesis gravidarum can lead to dehydration and malnutrition. The hyperemesis eating plan is one  way to lessen the symptoms of nausea and vomiting. It is often used with prescribed medicines to control your symptoms.  WHAT CAN I DO TO RELIEVE MY SYMPTOMS? Listen to your body. Everyone is different and has different preferences. Find what works best for you. Some of the following things may help:  Eat and drink slowly.  Eat 5-6 small meals daily instead of 3 large meals.   Eat crackers before you get out of bed  in the morning.   Starchy foods are usually well tolerated (such as cereal, toast, bread, potatoes, pasta, rice, and pretzels).   Ginger may help with nausea. Add  tsp ground ginger to hot tea or choose ginger tea.   Try drinking 100% fruit juice or an electrolyte drink.  Continue to take your prenatal vitamins as directed by your health care provider. If you are having trouble taking your prenatal vitamins, talk with your health care provider about different options.  Include at least 1 serving of protein with your meals and snacks (such as meats or poultry, beans, nuts, eggs, or yogurt). Try eating a protein-rich snack before bed (such as cheese and crackers or a half [place] or peanut butter sandwich). WHAT THINGS SHOULD I AVOID TO REDUCE MY SYMPTOMS? The following things may help reduce your symptoms:  Avoid foods with strong smells. Try eating meals in well-ventilated areas that are free of odors.  Avoid drinking water or other beverages with meals. Try not to drink anything less than 30 minutes before and after meals.  Avoid drinking more than 1 cup of fluid at a time.  Avoid fried or high-fat foods, such as butter and cream sauces.  Avoid spicy foods.  Avoid skipping meals the best you can. Nausea can be more intense on an empty stomach. If you cannot tolerate food at that time, do not force it. Try sucking on ice chips or other frozen items and make up the calories later.  Avoid lying down within 2 hours after eating.   This information is not intended to replace advice given to you by your health care provider. Make sure you discuss any questions you have with your health care provider.   Document Released: 11/06/2006 Document Revised: 01/14/2013 Document Reviewed: 11/13/2012 Elsevier Interactive Patient Education 2016 Elsevier Inc[employer]  Blood Amylase Test WHY AM I HAVING THIS TEST? The blood amylase test is used to check for pancreatitis and to monitor the progression of  this condition. Your health care provider may perform this test if you have acute abdominal pain. This test measures amylase in your blood. The levels of this enzyme rise in your blood within the first 12 hours of the onset of acute diseases of the pancreas. Amylase levels are also raised in people who have persistent disorders of the pancreas. WHAT KIND OF SAMPLE IS TAKEN? A blood sample is required for this test. It is usually collected by inserting a needle into a vein. HOW DO I PREPARE FOR THE TEST? There is no preparation or fasting required for this test. WHAT ARE THE REFERENCE RANGES? Reference ranges are considered healthy ranges established after testing a large group of healthy people. Reference ranges may vary among different people, labs, and hospitals. It is your responsibility to obtain your test results. Ask the lab or department performing the test when and how you will get your results.  Adult: 60-120 [place] units/dL or 30-220 units/L (SI units).  Newborn: 6-65 units/L. Amylase levels remain low for the first 2 months of  life. They increase to adult values by 28 year of age. Values may be slightly increased during normal pregnancy and in older adults. WHAT DO THE RESULTS MEAN? Values greater than the reference ranges may indicate:  Acute or long-lasting (chronic) pancreatitis.  Gastrointestinal (GI) disease.  Kidney failure.  Diabetic ketoacidosis. Talk with your health care provider to discuss your results, treatment options, and if necessary, the need for more tests. Talk with your health care provider if you have any questions about your results.   This information is not intended to replace advice given to you by your health care provider. Make sure you discuss any questions you have with your health care provider.   Document Released: 02/01/2004 Document Revised: 01/30/2014 Document Reviewed: 06/10/2013 Elsevier Interactive Patient Education [employer].

## 2015-04-22 NOTE — MAU Provider Note (Signed)
Evelyn Marshall is a 30 y.o. G3P1101 at 7.3 weeks c/o n/v x 1 week, un-relieved by reglan or zofran.   History     There are no active problems to display for this patient.   Chief Complaint  Patient presents with  . Nausea  . Emesis   Emesis  This is a recurrent problem. The current episode started 1 to 4 weeks ago. The problem occurs intermittently. The problem has been unchanged. The emesis has an appearance of bile and bright red blood. There has been no fever. Associated symptoms include headaches. She has tried increased fluids for the symptoms. The treatment provided mild relief.    OB History    Gravida Para Term Preterm AB TAB SAB Ectopic Multiple Living   3 2 1 1  0  0   1      Past Medical History  Diagnosis Date  . Abnormal Pap smear 2012    colpo result benign    Past Surgical History  Procedure Laterality Date  . Wisdom tooth extraction      History reviewed. No pertinent family history.  Social History  Substance Use Topics  . Smoking status: Never Smoker   . Smokeless tobacco: Never Used  . Alcohol Use: No    Allergies: No Known Allergies  Prescriptions prior to admission  Medication Sig Dispense Refill Last Dose  . glycopyrrolate (ROBINUL) 1 MG tablet Take 1 tablet (1 mg total) by mouth 3 (three) times daily as needed. (Patient taking differently: Take 1 mg by mouth 3 (three) times daily as needed (spitting). ) 30 tablet 2 04/22/2015 at Unknown time  . metoCLOPramide (REGLAN) 10 MG tablet Take 1 tablet (10 mg total) by mouth every 6 (six) hours as needed for nausea. 30 tablet 4 04/21/2015 at Unknown time  . ondansetron (ZOFRAN-ODT) 4 MG disintegrating tablet Take 4 mg by mouth 3 (three) times daily as needed for nausea.    04/22/2015 at Unknown time  . pantoprazole (PROTONIX) 40 MG tablet Take 1 tablet (40 mg total) by mouth daily. 30 tablet 2 Past Week at Unknown time  . Prenatal Vit-Fe Fumarate-FA (PRENATAL MULTIVITAMIN) TABS tablet Take 1 tablet by  mouth daily at 12 noon.   Past Week at Unknown time  . Doxylamine-Pyridoxine 10-10 MG TBEC Take 2 tablets at bedtime, on an empty stomach.  Take 1 tablet prior to breakfast and 1 tablet prior to lunch. (Patient not taking: Reported on 04/22/2015) 60 tablet 6     Review of Systems  Gastrointestinal: Positive for vomiting.  Neurological: Positive for headaches.   See HPI above, all other systems are negative  Physical Exam   Blood pressure 128/82, pulse 67, temperature 98.2 F (36.8 C), temperature source Oral, resp. rate 18, height 5\' 4"  (1.626 m), weight 114.851 kg (253 lb 3.2 oz), last menstrual period 03/01/2015.  Physical Exam Ext:  WNL ABD: Soft, non tender to palpation, no rebound or guarding SVE: deferred   ED Course  Assessment: IUP at  3weeks Membranes: FHR:  n/a CTX: n/a   Plan:  IVF Zofran Amylase, Lipase, CBC, CMP   [place], CNM, MSN 04/22/2015. 8:34 PM  Addendum:  Assumed care for patient at 1900.  Continues to exhibit Ptyalism and complains of nausea despite receiving 2L of fluids and IV Zofran. Denies abdominal pain or need to urinate at this time.  Reports limited food and fluids intake with occasional headaches, non currently and a significant history of hyperemesis gravidarum in previous pregnancies.  Results for orders placed or performed during the hospital encounter of 04/22/15 (from the past 24 hour(s))  Urinalysis, Routine w reflex microscopic (not at [facility])     Status: Abnormal   Collection Time: 04/22/15  5:20 PM  Result Value Ref Range   Color, Urine ORANGE (A) YELLOW   APPearance HAZY (A) CLEAR   Specific Gravity, Urine >1.030 (H) 1.005 - 1.030   pH 6.0 5.0 - 8.0   Glucose, UA NEGATIVE NEGATIVE mg/dL   Hgb urine dipstick TRACE (A) NEGATIVE   Bilirubin Urine SMALL (A) NEGATIVE   Ketones, ur 40 (A) NEGATIVE mg/dL   Protein, ur 100 (A) NEGATIVE mg/dL   Nitrite NEGATIVE NEGATIVE   Leukocytes, UA NEGATIVE NEGATIVE  Urine  microscopic-add on     Status: Abnormal   Collection Time: 04/22/15  5:20 PM  Result Value Ref Range   Squamous Epithelial / LPF 6-30 (A) NONE SEEN   WBC, UA 0-5 0 - 5 WBC/hpf   RBC / HPF NONE SEEN 0 - 5 RBC/hpf   Bacteria, UA FEW (A) NONE SEEN   Urine-Other MUCOUS PRESENT   CBC with Differential/Platelet     Status: Abnormal   Collection Time: 04/22/15  6:24 PM  Result Value Ref Range   WBC 7.1 4.0 - 10.5 K/uL   RBC 4.32 3.87 - 5.11 MIL/uL   Hemoglobin 10.2 (L) 12.0 - 15.0 g/dL   HCT 31.3 (L) 36.0 - 46.0 %   MCV 72.5 (L) 78.0 - 100.0 fL   MCH 23.6 (L) 26.0 - 34.0 pg   MCHC 32.6 30.0 - 36.0 g/dL   RDW 16.2 (H) 11.5 - 15.5 %   Platelets 343 150 - 400 K/uL   Neutrophils Relative % 64 %   Neutro Abs 4.5 1.7 - 7.7 K/uL   Lymphocytes Relative 31 %   Lymphs Abs 2.2 0.7 - 4.0 K/uL   Monocytes Relative 5 %   Monocytes Absolute 0.4 0.1 - 1.0 K/uL   Eosinophils Relative 0 %   Eosinophils Absolute 0.0 0.0 - 0.7 K/uL   Basophils Relative 0 %   Basophils Absolute 0.0 0.0 - 0.1 K/uL  Comprehensive metabolic panel     Status: Abnormal   Collection Time: 04/22/15  6:24 PM  Result Value Ref Range   Sodium 135 135 - 145 mmol/L   Potassium 3.4 (L) 3.5 - 5.1 mmol/L   Chloride 100 (L) 101 - 111 mmol/L   CO2 25 22 - 32 mmol/L   Glucose, Bld 88 65 - 99 mg/dL   BUN 7 6 - 20 mg/dL   Creatinine, Ser 0.69 0.44 - 1.00 mg/dL   Calcium 8.7 (L) 8.9 - 10.3 mg/dL   Total Protein 7.8 6.5 - 8.1 g/dL   Albumin 3.6 3.5 - 5.0 g/dL   AST 53 (H) 15 - 41 U/L   ALT 68 (H) 14 - 54 U/L   Alkaline Phosphatase 103 38 - 126 U/L   Total Bilirubin 0.6 0.3 - 1.2 mg/dL   GFR calc non Af Amer >60 >60 mL/min   GFR calc Af Amer >60 >60 mL/min   Anion gap 10 5 - 15  Amylase     Status: Abnormal   Collection Time: 04/22/15  6:24 PM  Result Value Ref Range   Amylase 183 (H) 28 - 100 U/L  Lipase, blood     Status: None   Collection Time: 04/22/15  6:24 PM  Result Value Ref Range   Lipase 15 11 - 51 U/L  Urinalysis,  Routine w reflex microscopic (not at [facility])     Status: Abnormal   Collection Time: 04/22/15  9:16 PM  Result Value Ref Range   Color, Urine YELLOW YELLOW   APPearance HAZY (A) CLEAR   Specific Gravity, Urine 1.015 1.005 - 1.030   pH 8.0 5.0 - 8.0   Glucose, UA NEGATIVE NEGATIVE mg/dL   Hgb urine dipstick NEGATIVE NEGATIVE   Bilirubin Urine NEGATIVE NEGATIVE   Ketones, ur >80 (A) NEGATIVE mg/dL   Protein, ur 30 (A) NEGATIVE mg/dL   Nitrite NEGATIVE NEGATIVE   Leukocytes, UA TRACE (A) NEGATIVE  Urine microscopic-add on     Status: Abnormal   Collection Time: 04/22/15  9:16 PM  Result Value Ref Range   Squamous Epithelial / LPF 6-30 (A) NONE SEEN   WBC, UA 0-5 0 - 5 WBC/hpf   RBC / HPF NONE SEEN 0 - 5 RBC/hpf   Bacteria, UA MANY (A) NONE SEEN   Urine-Other MUCOUS PRESENT    Assessment: Hyper emesis gravidarum with Dehydration, >40-80 Ketones Elevated AST/ALT  Elevated Amylase  Plan:  Consult with [place], MD Continue IV hydration IV Phenergen, 12.5 mg DC home  Eating plan for hyperemesis gravidarum included in d Eat small frequent meals (5-6) as tolerated - isch RX Phenergen 12.5mg , PO US Abdomen Limited RUQ, Ambulatory Keep Schuduled appt, 04/26/15 NOB & 05/18/15 NOBWU  Lavetta Nielsen, CNM, MSN 04/22/15  [id number]

## 2015-04-22 NOTE — MAU Provider Note (Signed)
Evelyn Marshall is a 30 y.o. G3P1101 at 7.3 weeks c/o n/v x 1 week, un-relieved by reglan or zofran.   History     There are no active problems to display for this patient.   Chief Complaint  Patient presents with  . Nausea  . Emesis   HPI  OB History    Gravida Para Term Preterm AB TAB SAB Ectopic Multiple Living   3 2 1 1  0  0   1      Past Medical History  Diagnosis Date  . Abnormal Pap smear 2012    colpo result benign    Past Surgical History  Procedure Laterality Date  . Wisdom tooth extraction      History reviewed. No pertinent family history.  Social History  Substance Use Topics  . Smoking status: Never Smoker   . Smokeless tobacco: Never Used  . Alcohol Use: No    Allergies: No Known Allergies  Prescriptions prior to admission  Medication Sig Dispense Refill Last Dose  . glycopyrrolate (ROBINUL) 1 MG tablet Take 1 tablet (1 mg total) by mouth 3 (three) times daily as needed. (Patient taking differently: Take 1 mg by mouth 3 (three) times daily as needed (spitting). ) 30 tablet 2 04/22/2015 at Unknown time  . metoCLOPramide (REGLAN) 10 MG tablet Take 1 tablet (10 mg total) by mouth every 6 (six) hours as needed for nausea. 30 tablet 4 04/21/2015 at Unknown time  . ondansetron (ZOFRAN-ODT) 4 MG disintegrating tablet Take 4 mg by mouth 3 (three) times daily as needed for nausea.    04/22/2015 at Unknown time  . pantoprazole (PROTONIX) 40 MG tablet Take 1 tablet (40 mg total) by mouth daily. 30 tablet 2 Past Week at Unknown time  . Prenatal Vit-Fe Fumarate-FA (PRENATAL MULTIVITAMIN) TABS tablet Take 1 tablet by mouth daily at 12 noon.   Past Week at Unknown time  . Doxylamine-Pyridoxine 10-10 MG TBEC Take 2 tablets at bedtime, on an empty stomach.  Take 1 tablet prior to breakfast and 1 tablet prior to lunch. (Patient not taking: Reported on 04/22/2015) 60 tablet 6     ROS See HPI above, all other systems are negative  Physical Exam   Blood pressure  128/82, pulse 67, temperature 98.2 F (36.8 C), temperature source Oral, resp. rate 18, height 5\' 4"  (1.626 m), weight 253 lb 3.2 oz (114.851 kg), last menstrual period 03/01/2015.  Physical Exam Ext:  WNL ABD: Soft, non tender to palpation, no rebound or guarding SVE: deferred   ED Course  Assessment: IUP at  3weeks Membranes: FHR:  n/a CTX: n/a   Plan:  IVF Zofran Amylase, Lipase, CBC, CMP   Jonquil Stubbe, CNM, MSN 04/22/2015. 6:13 PM

## 2015-04-22 NOTE — MAU Note (Addendum)
Pt reports she has been having N/V for over a week.  Stated she noticed some blood when she vomited a few times. On Reglan and zofran without releif. Call MD office and told o come to MAU.

## 2015-04-23 LAB — HEPATITIS PANEL, ACUTE
HEP A IGM: NEGATIVE
HEP B C IGM: NEGATIVE
Hepatitis B Surface Ag: NEGATIVE

## 2015-04-26 LAB — OB RESULTS CONSOLE RPR: RPR: NONREACTIVE

## 2015-04-26 LAB — OB RESULTS CONSOLE HEPATITIS B SURFACE ANTIGEN: Hepatitis B Surface Ag: NEGATIVE

## 2015-04-26 LAB — OB RESULTS CONSOLE GC/CHLAMYDIA
CHLAMYDIA, DNA PROBE: NEGATIVE
GC PROBE AMP, GENITAL: NEGATIVE

## 2015-04-26 LAB — OB RESULTS CONSOLE RUBELLA ANTIBODY, IGM: RUBELLA: IMMUNE

## 2015-04-26 LAB — OB RESULTS CONSOLE HIV ANTIBODY (ROUTINE TESTING): HIV: NONREACTIVE

## 2015-04-27 ENCOUNTER — Inpatient Hospital Stay (HOSPITAL_COMMUNITY)
Admission: AD | Admit: 2015-04-27 | Discharge: 2015-04-27 | Disposition: A | Discharge status: Home / Self Care | Payer: BC Managed Care – PPO | Source: Ambulatory Visit | Attending: Obstetrics and Gynecology | Admitting: Obstetrics and Gynecology

## 2015-04-27 ENCOUNTER — Encounter (HOSPITAL_COMMUNITY): Payer: Self-pay | Admitting: *Deleted

## 2015-04-27 DIAGNOSIS — Z3A08 8 weeks gestation of pregnancy: Secondary | ICD-10-CM | POA: Insufficient documentation

## 2015-04-27 DIAGNOSIS — E860 Dehydration: Secondary | ICD-10-CM | POA: Diagnosis not present

## 2015-04-27 DIAGNOSIS — R112 Nausea with vomiting, unspecified: Secondary | ICD-10-CM | POA: Diagnosis present

## 2015-04-27 DIAGNOSIS — O210 Mild hyperemesis gravidarum: Secondary | ICD-10-CM | POA: Diagnosis present

## 2015-04-27 DIAGNOSIS — Z79899 Other long term (current) drug therapy: Secondary | ICD-10-CM | POA: Insufficient documentation

## 2015-04-27 HISTORY — DX: Gestational (pregnancy-induced) hypertension without significant proteinuria, unspecified trimester: O13.9

## 2015-04-27 LAB — COMPREHENSIVE METABOLIC PANEL
ALBUMIN: 3.8 g/dL (ref 3.5–5.0)
ALT: 44 U/L (ref 14–54)
ANION GAP: 9 (ref 5–15)
AST: 27 U/L (ref 15–41)
Alkaline Phosphatase: 109 U/L (ref 38–126)
BUN: 6 mg/dL (ref 6–20)
CO2: 24 mmol/L (ref 22–32)
Calcium: 9.6 mg/dL (ref 8.9–10.3)
Chloride: 104 mmol/L (ref 101–111)
Creatinine, Ser: 0.66 mg/dL (ref 0.44–1.00)
GFR calc Af Amer: >60 mL/min (ref >60)
GFR calc non Af Amer: >60 mL/min (ref >60)
GLUCOSE: 92 mg/dL (ref 65–99)
POTASSIUM: 3.6 mmol/L (ref 3.5–5.1)
SODIUM: 137 mmol/L (ref 135–145)
Total Bilirubin: 0.5 mg/dL (ref 0.3–1.2)
Total Protein: 7.8 g/dL (ref 6.5–8.1)

## 2015-04-27 LAB — URINALYSIS, ROUTINE W REFLEX MICROSCOPIC
GLUCOSE, UA: NEGATIVE mg/dL
HGB URINE DIPSTICK: NEGATIVE
KETONES UR: 40 mg/dL — AB
LEUKOCYTES UA: NEGATIVE
Nitrite: NEGATIVE
PROTEIN: 100 mg/dL — AB
Specific Gravity, Urine: >1.03 — ABNORMAL HIGH (ref 1.005–1.030)
pH: 6 (ref 5.0–8.0)

## 2015-04-27 LAB — CBC
HCT: 33.1 % — ABNORMAL LOW (ref 36.0–46.0)
Hemoglobin: 11.1 g/dL — ABNORMAL LOW (ref 12.0–15.0)
MCH: 24.1 pg — ABNORMAL LOW (ref 26.0–34.0)
MCHC: 33.5 g/dL (ref 30.0–36.0)
MCV: 71.8 fL — ABNORMAL LOW (ref 78.0–100.0)
PLATELETS: 380 10*3/uL (ref 150–400)
RBC: 4.61 MIL/uL (ref 3.87–5.11)
RDW: 16.6 % — AB (ref 11.5–15.5)
WBC: 8.5 10*3/uL (ref 4.0–10.5)

## 2015-04-27 LAB — URINE MICROSCOPIC-ADD ON

## 2015-04-27 MED ORDER — LACTATED RINGERS IV SOLN
INTRAVENOUS | Status: DC
  Administered 2015-04-27: 12:00:00 via INTRAVENOUS

## 2015-04-27 MED ORDER — RINGERS IV SOLN: INTRAVENOUS | Status: DC

## 2015-04-27 MED ORDER — SODIUM CHLORIDE 0.9 % IV SOLN
8.0000 mg | Freq: Once | INTRAVENOUS | Status: AC
  Administered 2015-04-27: 8 mg via INTRAVENOUS
  Filled 2015-04-27: qty 4

## 2015-04-27 NOTE — MAU Note (Signed)
Pt states seen on 04/22/15 and given 3 bags of fluid and phenergan.  States nausea and vomited has persisted since then.  States that phenergan and zofran has not been working.  Pt called office yesterday and told to double dose of phenergan, but nausea and vomiting still persists.  Pt not tolerating solids or fluids.

## 2015-04-27 NOTE — MAU Provider Note (Signed)
Evelyn Marshall is a 30 yo, G3P1101, at 8.1 wks by LMP presenting unnannouced to MAU for N/V  Times 1 week, unrelieved by promethazine.    History     There are no active problems to display for this patient.   Chief Complaint  Patient presents with  . Emesis During Pregnancy   HPI  OB History    Gravida Para Term Preterm AB TAB SAB Ectopic Multiple Living   3 2 1 1  0  0   1      Past Medical History  Diagnosis Date  . Abnormal Pap smear 2012    colpo result benign  . Pregnancy induced hypertension     Past Surgical History  Procedure Laterality Date  . Wisdom tooth extraction      History reviewed. No pertinent family history.  Social History  Substance Use Topics  . Smoking status: Never Smoker   . Smokeless tobacco: Never Used  . Alcohol Use: No    Allergies: No Known Allergies  Prescriptions prior to admission  Medication Sig Dispense Refill Last Dose  . glycopyrrolate (ROBINUL) 1 MG tablet Take 1 tablet (1 mg total) by mouth 3 (three) times daily as needed. (Patient taking differently: Take 1 mg by mouth 3 (three) times daily as needed (spitting). ) 30 tablet 2 04/26/2015 at Unknown time  . ondansetron (ZOFRAN-ODT) 4 MG disintegrating tablet Take 4 mg by mouth 3 (three) times daily as needed for nausea.    04/26/2015 at Unknown time  . pantoprazole (PROTONIX) 40 MG tablet Take 1 tablet (40 mg total) by mouth daily. 30 tablet 2 04/26/2015 at Unknown time  . promethazine (PHENERGAN) 12.5 MG tablet Take 1 tablet (12.5 mg total) by mouth every 6 (six) hours as needed for nausea or vomiting. 30 tablet 0 04/27/2015 at Unknown time  . Doxylamine-Pyridoxine 10-10 MG TBEC Take 2 tablets at bedtime, on an empty stomach.  Take 1 tablet prior to breakfast and 1 tablet prior to lunch. (Patient not taking: Reported on 04/27/2015) 60 tablet 6   . metoCLOPramide (REGLAN) 10 MG tablet Take 1 tablet (10 mg total) by mouth every 6 (six) hours as needed for nausea. (Patient not taking:  Reported on 04/27/2015) 30 tablet 4 04/21/2015 at Unknown time    ROS Physical Exam   Blood pressure 129/64, pulse 78, temperature 97.9 F (36.6 C), temperature source Oral, resp. rate 18, height 5\' 4"  (1.626 m), weight 112.946 kg (249 lb), last menstrual period 03/01/2015, SpO2 98 %.  Filed Vitals:   04/27/15 0936 04/27/15 1053 04/27/15 1129  BP: 133/83 129/64 122/65  Pulse: 82 78 72  Temp: 97.9 F (36.6 C)    TempSrc: Oral    Resp: 18    Height: 5\' 4"  (1.626 m)    Weight: 112.946 kg (249 lb)    SpO2: 98%     Results for orders placed or performed during the hospital encounter of 04/27/15 (from the past 24 hour(s))  Urinalysis, Routine w reflex microscopic (not at [facility])     Status: Abnormal   Collection Time: 04/27/15  9:43 AM  Result Value Ref Range   Color, Urine YELLOW YELLOW   APPearance CLEAR CLEAR   Specific Gravity, Urine >1.030 (H) 1.005 - 1.030   pH 6.0 5.0 - 8.0   Glucose, UA NEGATIVE NEGATIVE mg/dL   Hgb urine dipstick NEGATIVE NEGATIVE   Bilirubin Urine MODERATE (A) NEGATIVE   Ketones, ur 40 (A) NEGATIVE mg/dL   Protein, ur 100 (A) NEGATIVE  mg/dL   Nitrite NEGATIVE NEGATIVE   Leukocytes, UA NEGATIVE NEGATIVE  Urine microscopic-add on     Status: Abnormal   Collection Time: 04/27/15  9:43 AM  Result Value Ref Range   Squamous Epithelial / LPF 0-5 (A) NONE SEEN   WBC, UA 0-5 0 - 5 WBC/hpf   RBC / HPF 0-5 0 - 5 RBC/hpf   Bacteria, UA FEW (A) NONE SEEN   Urine-Other MUCOUS PRESENT   CBC     Status: Abnormal   Collection Time: 04/27/15 10:16 AM  Result Value Ref Range   WBC 8.5 4.0 - 10.5 K/uL   RBC 4.61 3.87 - 5.11 MIL/uL   Hemoglobin 11.1 (L) 12.0 - 15.0 g/dL   HCT 33.1 (L) 36.0 - 46.0 %   MCV 71.8 (L) 78.0 - 100.0 fL   MCH 24.1 (L) 26.0 - 34.0 pg   MCHC 33.5 30.0 - 36.0 g/dL   RDW 16.6 (H) 11.5 - 15.5 %   Platelets 380 150 - 400 K/uL  Comprehensive metabolic panel     Status: None   Collection Time: 04/27/15 10:16 AM  Result Value Ref Range   Sodium  137 135 - 145 mmol/L   Potassium 3.6 3.5 - 5.1 mmol/L   Chloride 104 101 - 111 mmol/L   CO2 24 22 - 32 mmol/L   Glucose, Bld 92 65 - 99 mg/dL   BUN 6 6 - 20 mg/dL   Creatinine, Ser 0.66 0.44 - 1.00 mg/dL   Calcium 9.6 8.9 - 10.3 mg/dL   Total Protein 7.8 6.5 - 8.1 g/dL   Albumin 3.8 3.5 - 5.0 g/dL   AST 27 15 - 41 U/L   ALT 44 14 - 54 U/L   Alkaline Phosphatase 109 38 - 126 U/L   Total Bilirubin 0.5 0.3 - 1.2 mg/dL   GFR calc non Af Amer >60 >60 mL/min   GFR calc Af Amer >60 >60 mL/min   Anion gap 9 5 - 15     Physical Exam  Constitutional: She is oriented to person, place, and time. She appears well-developed and well-nourished.  HENT:  Head: Normocephalic.  Eyes: Pupils are equal, round, and reactive to light.  Neck: Normal range of motion.  Cardiovascular: Normal rate and regular rhythm.   Respiratory: Effort normal.  GI: Soft.  Musculoskeletal: Normal range of motion.  Neurological: She is alert and oriented to person, place, and time. She has normal reflexes.  Skin: Skin is warm and dry.  Psychiatric: She has a normal mood and affect. Her behavior is normal. Judgment and thought content normal.    ED Course  Assessment: Hyperemesis gravidarum Dehydration, 40 ketones   Intermittent Plan: IV hydration Zofran 8mg  IV PO challenge  DC home if stable  Lavetta Nielsen CNM, MSN 04/27/2015 11:08 AM

## 2015-04-27 NOTE — Discharge Instructions (Signed)
Hyperemesis Gravidarum Hyperemesis gravidarum is a severe form of nausea and vomiting that happens during pregnancy. Hyperemesis is worse than morning sickness. It may cause you to have nausea or vomiting all day for many days. It may keep you from eating and drinking enough food and liquids. Hyperemesis usually occurs during the first half (the first 20 weeks) of pregnancy. It often goes away once a woman is in her second half of pregnancy. However, sometimes hyperemesis continues through an entire pregnancy.  CAUSES  The cause of this condition is not completely known but is thought to be related to changes in the body's hormones when pregnant. It could be from the high level of the pregnancy hormone or an increase in estrogen in the body.  SIGNS AND SYMPTOMS   Severe nausea and vomiting.  Nausea that does not go away.  Vomiting that does not allow you to keep any food down.  Weight loss and body fluid loss (dehydration).  Having no desire to eat or not liking food you have previously enjoyed. DIAGNOSIS  Your health care provider will do a physical exam and ask you about your symptoms. He or she may also order blood tests and urine tests to make sure something else is not causing the problem.  TREATMENT  You may only need medicine to control the problem. If medicines do not control the nausea and vomiting, you will be treated in the hospital to prevent dehydration, increased acid in the blood (acidosis), weight loss, and changes in the electrolytes in your body that may harm the unborn baby (fetus). You may need IV fluids.  HOME CARE INSTRUCTIONS   Only take over-the-counter or prescription medicines as directed by your health care provider.  Try eating a couple of dry crackers or toast in the morning before getting out of bed.  Avoid foods and smells that upset your stomach.  Avoid fatty and spicy foods.  Eat 5-6 small meals a day.  Do not drink when eating meals. Drink between  meals.  For snacks, eat high-protein foods, such as cheese.  Eat or suck on things that have ginger in them. Ginger helps nausea.  Avoid food preparation. The smell of food can spoil your appetite.  Avoid iron pills and iron in your multivitamins until after 3-4 months of being pregnant. However, consult with your health care provider before stopping any prescribed iron pills. SEEK MEDICAL CARE IF:   Your abdominal pain increases.  You have a severe headache.  You have vision problems.  You are losing weight. SEEK IMMEDIATE MEDICAL CARE IF:   You are unable to keep fluids down.  You vomit blood.  You have constant nausea and vomiting.  You have excessive weakness.  You have extreme thirst.  You have dizziness or fainting.  You have a fever or persistent symptoms for more than 2-3 days.  You have a fever and your symptoms suddenly get worse. MAKE SURE YOU:   Understand these instructions.  Will watch your condition.  Will get help right away if you are not doing well or get worse.   This information is not intended to replace advice given to you by your health care provider. Make sure you discuss any questions you have with your health care provider.   Document Released: 01/09/2005 Document Revised: 10/30/2012 Document Reviewed: 08/21/2012 Elsevier Interactive Patient Education 2016 [place] for Hyperemesis Gravidarum Severe cases of hyperemesis gravidarum can lead to dehydration and malnutrition. The hyperemesis eating plan is one  way to lessen the symptoms of nausea and vomiting. It is often used with prescribed medicines to control your symptoms.  WHAT CAN I DO TO RELIEVE MY SYMPTOMS? Listen to your body. Everyone is different and has different preferences. Find what works best for you. Some of the following things may help:  Eat and drink slowly.  Eat 5-6 small meals daily instead of 3 large meals.   Eat crackers before you get out of bed  in the morning.   Starchy foods are usually well tolerated (such as cereal, toast, bread, potatoes, pasta, rice, and pretzels).   Ginger may help with nausea. Add  tsp ground ginger to hot tea or choose ginger tea.   Try drinking 100% fruit juice or an electrolyte drink.  Continue to take your prenatal vitamins as directed by your health care provider. If you are having trouble taking your prenatal vitamins, talk with your health care provider about different options.  Include at least 1 serving of protein with your meals and snacks (such as meats or poultry, beans, nuts, eggs, or yogurt). Try eating a protein-rich snack before bed (such as cheese and crackers or a half [place] or peanut butter sandwich). WHAT THINGS SHOULD I AVOID TO REDUCE MY SYMPTOMS? The following things may help reduce your symptoms:  Avoid foods with strong smells. Try eating meals in well-ventilated areas that are free of odors.  Avoid drinking water or other beverages with meals. Try not to drink anything less than 30 minutes before and after meals.  Avoid drinking more than 1 cup of fluid at a time.  Avoid fried or high-fat foods, such as butter and cream sauces.  Avoid spicy foods.  Avoid skipping meals the best you can. Nausea can be more intense on an empty stomach. If you cannot tolerate food at that time, do not force it. Try sucking on ice chips or other frozen items and make up the calories later.  Avoid lying down within 2 hours after eating.   This information is not intended to replace advice given to you by your health care provider. Make sure you discuss any questions you have with your health care provider.   Document Released: 11/06/2006 Document Revised: 01/14/2013 Document Reviewed: 11/13/2012 Elsevier Interactive Patient Education [employer].

## 2015-05-08 ENCOUNTER — Encounter (HOSPITAL_COMMUNITY): Payer: Self-pay | Admitting: *Deleted

## 2015-05-08 ENCOUNTER — Inpatient Hospital Stay (HOSPITAL_COMMUNITY)
Admission: AD | Admit: 2015-05-08 | Discharge: 2015-05-09 | Disposition: A | Discharge status: Home / Self Care | Payer: BC Managed Care – PPO | Source: Ambulatory Visit | Attending: Obstetrics and Gynecology | Admitting: Obstetrics and Gynecology

## 2015-05-08 DIAGNOSIS — Z79899 Other long term (current) drug therapy: Secondary | ICD-10-CM | POA: Diagnosis not present

## 2015-05-08 DIAGNOSIS — R634 Abnormal weight loss: Secondary | ICD-10-CM | POA: Diagnosis not present

## 2015-05-08 DIAGNOSIS — Z3A09 9 weeks gestation of pregnancy: Secondary | ICD-10-CM | POA: Insufficient documentation

## 2015-05-08 DIAGNOSIS — O210 Mild hyperemesis gravidarum: Secondary | ICD-10-CM | POA: Diagnosis not present

## 2015-05-08 LAB — URINALYSIS, ROUTINE W REFLEX MICROSCOPIC
GLUCOSE, UA: 250 mg/dL — AB
Leukocytes, UA: NEGATIVE
Nitrite: POSITIVE — AB
Specific Gravity, Urine: >1.03 — ABNORMAL HIGH (ref 1.005–1.030)
pH: 6.5 (ref 5.0–8.0)

## 2015-05-08 LAB — CBC WITH DIFFERENTIAL/PLATELET
BASOS ABS: 0 10*3/uL (ref 0.0–0.1)
BASOS PCT: 0 %
EOS ABS: 0 10*3/uL (ref 0.0–0.7)
Eosinophils Relative: 0 %
HCT: 35.2 % — ABNORMAL LOW (ref 36.0–46.0)
HEMOGLOBIN: 11.9 g/dL — AB (ref 12.0–15.0)
LYMPHS ABS: 1.5 10*3/uL (ref 0.7–4.0)
Lymphocytes Relative: 13 %
MCH: 24.1 pg — ABNORMAL LOW (ref 26.0–34.0)
MCHC: 33.8 g/dL (ref 30.0–36.0)
MCV: 71.3 fL — ABNORMAL LOW (ref 78.0–100.0)
Monocytes Absolute: 0.8 10*3/uL (ref 0.1–1.0)
Monocytes Relative: 7 %
NEUTROS PCT: 80 %
Neutro Abs: 9.3 10*3/uL — ABNORMAL HIGH (ref 1.7–7.7)
Platelets: 405 10*3/uL — ABNORMAL HIGH (ref 150–400)
RBC: 4.94 MIL/uL (ref 3.87–5.11)
RDW: 16.4 % — ABNORMAL HIGH (ref 11.5–15.5)
WBC: 11.6 10*3/uL — AB (ref 4.0–10.5)

## 2015-05-08 LAB — URINE MICROSCOPIC-ADD ON

## 2015-05-08 LAB — COMPREHENSIVE METABOLIC PANEL
ALT: 56 U/L — ABNORMAL HIGH (ref 14–54)
ANION GAP: 10 (ref 5–15)
AST: 41 U/L (ref 15–41)
Albumin: 3.7 g/dL (ref 3.5–5.0)
Alkaline Phosphatase: 118 U/L (ref 38–126)
BILIRUBIN TOTAL: 2.4 mg/dL — AB (ref 0.3–1.2)
BUN: 10 mg/dL (ref 6–20)
CO2: 23 mmol/L (ref 22–32)
Calcium: 9.4 mg/dL (ref 8.9–10.3)
Chloride: 101 mmol/L (ref 101–111)
Creatinine, Ser: 0.6 mg/dL (ref 0.44–1.00)
Glucose, Bld: 101 mg/dL — ABNORMAL HIGH (ref 65–99)
POTASSIUM: 3.7 mmol/L (ref 3.5–5.1)
SODIUM: 134 mmol/L — AB (ref 135–145)
TOTAL PROTEIN: 8.2 g/dL — AB (ref 6.5–8.1)

## 2015-05-08 LAB — PREGNANCY, URINE: Preg Test, Ur: POSITIVE — AB

## 2015-05-08 LAB — HCG, QUANTITATIVE, PREGNANCY: hCG, Beta Chain, Quant, S: 104078 m[IU]/mL — ABNORMAL HIGH (ref <5)

## 2015-05-08 LAB — LIPASE, BLOOD: LIPASE: 20 U/L (ref 11–51)

## 2015-05-08 LAB — AMYLASE: AMYLASE: 96 U/L (ref 28–100)

## 2015-05-08 LAB — GLUCOSE, CAPILLARY: Glucose-Capillary: 97 mg/dL (ref 65–99)

## 2015-05-08 LAB — LACTATE DEHYDROGENASE: LDH: 148 U/L (ref 98–192)

## 2015-05-08 LAB — POCT PREGNANCY, URINE: Preg Test, Ur: POSITIVE — AB

## 2015-05-08 LAB — URIC ACID: URIC ACID, SERUM: 6.9 mg/dL — AB (ref 2.3–6.6)

## 2015-05-08 MED ORDER — SODIUM CHLORIDE 0.9 % IV SOLN
8.0000 mg | Freq: Once | INTRAVENOUS | Status: AC
  Administered 2015-05-08: 8 mg via INTRAVENOUS
  Filled 2015-05-08: qty 4

## 2015-05-08 MED ORDER — LACTATED RINGERS IV BOLUS (SEPSIS)
1000.0000 mL | Freq: Once | INTRAVENOUS | Status: AC
  Administered 2015-05-08: 1000 mL via INTRAVENOUS

## 2015-05-08 MED ORDER — ONDANSETRON HCL 4 MG/2ML IJ SOLN: 4.0000 mg | Freq: Once | INTRAMUSCULAR | Status: DC

## 2015-05-08 NOTE — MAU Provider Note (Signed)
Evelyn Marshall is a 30 y.o. G3P1 at 9.5 weeks presents unannounced c/o nausea and cant keep anything down. Has been this way for a month. No pain except her lower back. No leaking, bleeding, or cramping. Also reports constipation and no bowel movement since Monday.  She reports having hyperemesis in the last two pregnancies that lasted until 19-20 weeks.  She states she was hospitalized for days at a time, had a feeding tube and received home health care during the last pregnancy.  She currently takes diclegis and phenergan at night and diclegis and reglan in the morning.     History     Patient Active Problem List   Diagnosis Date Noted  . Hyperemesis gravidarum 04/27/2015    Chief Complaint  Patient presents with  . Nausea  . Vomiting  . Weight Loss   HPI  OB History    Gravida Para Term Preterm AB TAB SAB Ectopic Multiple Living   3 2 1 1  0  0   1      Past Medical History  Diagnosis Date  . Abnormal Pap smear 2012    colpo result benign  . Pregnancy induced hypertension     Past Surgical History  Procedure Laterality Date  . Wisdom tooth extraction      History reviewed. No pertinent family history.  Social History  Substance Use Topics  . Smoking status: Never Smoker   . Smokeless tobacco: Never Used  . Alcohol Use: No    Allergies: No Known Allergies  Prescriptions prior to admission  Medication Sig Dispense Refill Last Dose  . Doxylamine-Pyridoxine 10-10 MG TBEC Take 2 tablets at bedtime, on an empty stomach.  Take 1 tablet prior to breakfast and 1 tablet prior to lunch. 60 tablet 6 05/08/2015 at Unknown time  . glycopyrrolate (ROBINUL) 1 MG tablet Take 1 tablet (1 mg total) by mouth 3 (three) times daily as needed. (Patient taking differently: Take 1 mg by mouth 3 (three) times daily as needed (spitting). ) 30 tablet 2 Past Week at Unknown time  . metoCLOPramide (REGLAN) 10 MG tablet Take 1 tablet (10 mg total) by mouth every 6 (six) hours as needed for  nausea. 30 tablet 4 05/08/2015 at Unknown time  . ondansetron (ZOFRAN-ODT) 4 MG disintegrating tablet Take 4 mg by mouth 3 (three) times daily as needed for nausea.    Past Week at Unknown time  . pantoprazole (PROTONIX) 40 MG tablet Take 1 tablet (40 mg total) by mouth daily. 30 tablet 2 05/07/2015 at Unknown time  . promethazine (PHENERGAN) 12.5 MG tablet Take 1 tablet (12.5 mg total) by mouth every 6 (six) hours as needed for nausea or vomiting. 30 tablet 0 05/07/2015 at Unknown time    ROS See HPI above, all other systems are negative  Physical Exam   Blood pressure 130/100, pulse 74, temperature 98 F (36.7 C), temperature source Oral, resp. rate 18, height 5\' 4"  (1.626 m), weight 240 lb (108.863 kg), last menstrual period 03/01/2015.  Physical Exam Ext:  WNL ABD: Soft, non tender to palpation, no rebound or guarding SVE: deferred   ED Course  Assessment: IUP at  9.5weeks Membranes: intact FHR:  n/a   Plan: IVF bolus 2L zofran 8mg  IV CBC, cmp, amylase, lipase, uric acid, LDH, quant Consult with Dr Yamaris Broad Jolonda Gomm, CNM, MSN 05/08/2015. 9:58 PM   Addendum Zofran given at 2238 Pt c/o N/V has returned IV PB Phenergan 25mg  now  Addendum Pt report  feeling much better than when she came.   MAU Addendum Note  Results for orders placed or performed during the hospital encounter of 05/08/15 (from the past 24 hour(s))  Urinalysis, Routine w reflex microscopic (not at [facility])     Status: Abnormal   Collection Time: 05/08/15  8:33 PM  Result Value Ref Range   Color, Urine YELLOW YELLOW   APPearance CLEAR CLEAR   Specific Gravity, Urine >1.030 (H) 1.005 - 1.030   pH 6.5 5.0 - 8.0   Glucose, UA 250 (A) NEGATIVE mg/dL   Hgb urine dipstick TRACE (A) NEGATIVE   Bilirubin Urine MODERATE (A) NEGATIVE   Ketones, ur >80 (A) NEGATIVE mg/dL   Protein, ur >300 (A) NEGATIVE mg/dL   Nitrite POSITIVE (A) NEGATIVE   Leukocytes, UA NEGATIVE NEGATIVE  Pregnancy, urine      Status: Abnormal   Collection Time: 05/08/15  8:33 PM  Result Value Ref Range   Preg Test, Ur POSITIVE (A) NEGATIVE  Urine microscopic-add on     Status: Abnormal   Collection Time: 05/08/15  8:33 PM  Result Value Ref Range   Squamous Epithelial / LPF 0-5 (A) NONE SEEN   WBC, UA 0-5 0 - 5 WBC/hpf   RBC / HPF 0-5 0 - 5 RBC/hpf   Bacteria, UA MANY (A) NONE SEEN   Urine-Other MUCOUS PRESENT   Pregnancy, urine POC     Status: Abnormal   Collection Time: 05/08/15  8:39 PM  Result Value Ref Range   Preg Test, Ur POSITIVE (A) NEGATIVE  CBC with Differential/Platelet     Status: Abnormal   Collection Time: 05/08/15 10:25 PM  Result Value Ref Range   WBC 11.6 (H) 4.0 - 10.5 K/uL   RBC 4.94 3.87 - 5.11 MIL/uL   Hemoglobin 11.9 (L) 12.0 - 15.0 g/dL   HCT 35.2 (L) 36.0 - 46.0 %   MCV 71.3 (L) 78.0 - 100.0 fL   MCH 24.1 (L) 26.0 - 34.0 pg   MCHC 33.8 30.0 - 36.0 g/dL   RDW 16.4 (H) 11.5 - 15.5 %   Platelets 405 (H) 150 - 400 K/uL   Neutrophils Relative % 80 %   Neutro Abs 9.3 (H) 1.7 - 7.7 K/uL   Lymphocytes Relative 13 %   Lymphs Abs 1.5 0.7 - 4.0 K/uL   Monocytes Relative 7 %   Monocytes Absolute 0.8 0.1 - 1.0 K/uL   Eosinophils Relative 0 %   Eosinophils Absolute 0.0 0.0 - 0.7 K/uL   Basophils Relative 0 %   Basophils Absolute 0.0 0.0 - 0.1 K/uL  Comprehensive metabolic panel     Status: Abnormal   Collection Time: 05/08/15 10:25 PM  Result Value Ref Range   Sodium 134 (L) 135 - 145 mmol/L   Potassium 3.7 3.5 - 5.1 mmol/L   Chloride 101 101 - 111 mmol/L   CO2 23 22 - 32 mmol/L   Glucose, Bld 101 (H) 65 - 99 mg/dL   BUN 10 6 - 20 mg/dL   Creatinine, Ser 0.60 0.44 - 1.00 mg/dL   Calcium 9.4 8.9 - 10.3 mg/dL   Total Protein 8.2 (H) 6.5 - 8.1 g/dL   Albumin 3.7 3.5 - 5.0 g/dL   AST 41 15 - 41 U/L   ALT 56 (H) 14 - 54 U/L   Alkaline Phosphatase 118 38 - 126 U/L   Total Bilirubin 2.4 (H) 0.3 - 1.2 mg/dL   GFR calc non Af Amer >60 >60 mL/min   GFR  calc Af Amer >60 >60 mL/min    Anion gap 10 5 - 15  Lactate dehydrogenase     Status: None   Collection Time: 05/08/15 10:25 PM  Result Value Ref Range   LDH 148 98 - 192 U/L  Lipase, blood     Status: None   Collection Time: 05/08/15 10:25 PM  Result Value Ref Range   Lipase 20 11 - 51 U/L  Amylase     Status: None   Collection Time: 05/08/15 10:25 PM  Result Value Ref Range   Amylase 96 28 - 100 U/L  Uric acid     Status: Abnormal   Collection Time: 05/08/15 10:25 PM  Result Value Ref Range   Uric Acid, Serum 6.9 (H) 2.3 - 6.6 mg/dL  hCG, quantitative, pregnancy     Status: Abnormal   Collection Time: 05/08/15 10:25 PM  Result Value Ref Range   hCG, Beta Chain, Quant, S 104078 (H) <5 mIU/mL  Glucose, capillary     Status: None   Collection Time: 05/08/15 10:55 PM  Result Value Ref Range   Glucose-Capillary 97 65 - 99 mg/dL   Suspected UTI, treat prophylacticly, urine culture sent  Plan: -Rx for macrobid  -Discussed need to follow up in office next week -continue on antinausea therapy: diclegis and phenergan HS, diclegis and reglan in the am, diclegis with lunch and zofran q8 -Bleeding and PTL Precautions -Encouraged to call if any questions or concerns arise prior to next scheduled office visit.  -Discharged to home in stable condition Consulted with Dr. Nikolina Broad Aniya Jolicoeur, CNM, MSN 05/09/2015. 3:13 AM

## 2015-05-08 NOTE — MAU Note (Addendum)
Patient has nausea and cant keep anything down. Has been this way for a month. No pain except her lower back. No leaking, bleeding, or cramping. Also reports constipation and no bowel movement since Monday.

## 2015-05-09 MED ORDER — SODIUM CHLORIDE 0.9 % IV SOLN
25.0000 mg | Freq: Once | INTRAVENOUS | Status: AC
  Administered 2015-05-09: 25 mg via INTRAVENOUS
  Filled 2015-05-09: qty 1

## 2015-05-09 MED ORDER — LACTATED RINGERS IV BOLUS (SEPSIS)
1000.0000 mL | Freq: Once | INTRAVENOUS | Status: AC
  Administered 2015-05-09: 1000 mL via INTRAVENOUS

## 2015-05-09 MED ORDER — PANTOPRAZOLE SODIUM 40 MG IV SOLR
40.0000 mg | Freq: Once | INTRAVENOUS | Status: AC
  Administered 2015-05-09: 40 mg via INTRAVENOUS
  Filled 2015-05-09: qty 40

## 2015-05-09 MED ORDER — ONDANSETRON 8 MG PO TBDP: 8.0000 mg | ORAL_TABLET | Freq: Three times a day (TID) | ORAL | Status: DC | PRN

## 2015-05-09 NOTE — Discharge Instructions (Signed)
Eating Plan for Hyperemesis Gravidarum °Severe cases of hyperemesis gravidarum can lead to dehydration and malnutrition. The hyperemesis eating plan is one way to lessen the symptoms of nausea and vomiting. It is often used with prescribed medicines to control your symptoms.  °WHAT CAN I DO TO RELIEVE MY SYMPTOMS? °Listen to your body. Everyone is different and has different preferences. Find what works best for you. Some of the following things may help: °· Eat and drink slowly. °· Eat 5-6 small meals daily instead of 3 large meals.   °· Eat crackers before you get out of bed in the morning.   °· Starchy foods are usually well tolerated (such as cereal, toast, bread, potatoes, pasta, rice, and pretzels).   °· Ginger may help with nausea. Add ¼ tsp ground ginger to hot tea or choose ginger tea.   °· Try drinking 100% fruit juice or an electrolyte drink. °· Continue to take your prenatal vitamins as directed by your health care provider. If you are having trouble taking your prenatal vitamins, talk with your health care provider about different options. °· Include at least 1 serving of protein with your meals and snacks (such as meats or poultry, beans, nuts, eggs, or yogurt). Try eating a protein-rich snack before bed (such as cheese and crackers or a half [place] or peanut butter sandwich). °WHAT THINGS SHOULD I AVOID TO REDUCE MY SYMPTOMS? °The following things may help reduce your symptoms: °· Avoid foods with strong smells. Try eating meals in well-ventilated areas that are free of odors. °· Avoid drinking water or other beverages with meals. Try not to drink anything less than 30 minutes before and after meals. °· Avoid drinking more than 1 cup of fluid at a time. °· Avoid fried or high-fat foods, such as butter and cream sauces. °· Avoid spicy foods. °· Avoid skipping meals the best you can. Nausea can be more intense on an empty stomach. If you cannot tolerate food at that time, do not force it. Try sucking on  ice chips or other frozen items and make up the calories later. °· Avoid lying down within 2 hours after eating. °  °This information is not intended to replace advice given to you by your health care provider. Make sure you discuss any questions you have with your health care provider. °  °Document Released: 11/06/2006 Document Revised: 01/14/2013 Document Reviewed: 11/13/2012 °Elsevier Interactive Patient Education ©2016 [employer] ° °

## 2015-05-11 LAB — CULTURE, OB URINE

## 2015-05-18 ENCOUNTER — Encounter (HOSPITAL_COMMUNITY): Payer: Self-pay

## 2015-05-18 ENCOUNTER — Inpatient Hospital Stay (HOSPITAL_COMMUNITY)
Admission: AD | Admit: 2015-05-18 | Discharge: 2015-05-21 | DRG: 774 | Disposition: A | Discharge status: Home / Self Care | Payer: BC Managed Care – PPO | Source: Ambulatory Visit | Attending: Obstetrics and Gynecology | Admitting: Obstetrics and Gynecology

## 2015-05-18 DIAGNOSIS — O210 Mild hyperemesis gravidarum: Secondary | ICD-10-CM | POA: Diagnosis present

## 2015-05-18 DIAGNOSIS — O26891 Other specified pregnancy related conditions, first trimester: Secondary | ICD-10-CM | POA: Diagnosis present

## 2015-05-18 DIAGNOSIS — O34533 Maternal care for retroversion of gravid uterus, third trimester: Secondary | ICD-10-CM | POA: Diagnosis present

## 2015-05-18 DIAGNOSIS — O26899 Other specified pregnancy related conditions, unspecified trimester: Secondary | ICD-10-CM

## 2015-05-18 DIAGNOSIS — O99214 Obesity complicating childbirth: Secondary | ICD-10-CM | POA: Diagnosis present

## 2015-05-18 DIAGNOSIS — Z3A11 11 weeks gestation of pregnancy: Secondary | ICD-10-CM | POA: Diagnosis not present

## 2015-05-18 DIAGNOSIS — Z8249 Family history of ischemic heart disease and other diseases of the circulatory system: Secondary | ICD-10-CM

## 2015-05-18 DIAGNOSIS — O10919 Unspecified pre-existing hypertension complicating pregnancy, unspecified trimester: Secondary | ICD-10-CM

## 2015-05-18 DIAGNOSIS — O99611 Diseases of the digestive system complicating pregnancy, first trimester: Secondary | ICD-10-CM | POA: Diagnosis present

## 2015-05-18 DIAGNOSIS — O1002 Pre-existing essential hypertension complicating childbirth: Secondary | ICD-10-CM | POA: Diagnosis present

## 2015-05-18 DIAGNOSIS — Z825 Family history of asthma and other chronic lower respiratory diseases: Secondary | ICD-10-CM

## 2015-05-18 DIAGNOSIS — O211 Hyperemesis gravidarum with metabolic disturbance: Secondary | ICD-10-CM | POA: Diagnosis present

## 2015-05-18 DIAGNOSIS — Z6841 Body Mass Index (BMI) 40.0 and over, adult: Secondary | ICD-10-CM

## 2015-05-18 DIAGNOSIS — Z6791 Unspecified blood type, Rh negative: Secondary | ICD-10-CM | POA: Diagnosis not present

## 2015-05-18 DIAGNOSIS — K117 Disturbances of salivary secretion: Secondary | ICD-10-CM | POA: Diagnosis present

## 2015-05-18 HISTORY — DX: Unspecified pre-existing hypertension complicating pregnancy, unspecified trimester: O10.919

## 2015-05-18 HISTORY — DX: Other specified pregnancy related conditions, unspecified trimester: O26.899

## 2015-05-18 LAB — COMPREHENSIVE METABOLIC PANEL
ALK PHOS: 161 U/L — AB (ref 38–126)
ALT: 217 U/L — AB (ref 14–54)
AST: 79 U/L — AB (ref 15–41)
Albumin: 3.6 g/dL (ref 3.5–5.0)
Anion gap: 10 (ref 5–15)
CALCIUM: 9.4 mg/dL (ref 8.9–10.3)
CHLORIDE: 95 mmol/L — AB (ref 101–111)
CO2: 27 mmol/L (ref 22–32)
CREATININE: 0.6 mg/dL (ref 0.44–1.00)
Glucose, Bld: 97 mg/dL (ref 65–99)
Potassium: 2.7 mmol/L — CL (ref 3.5–5.1)
SODIUM: 132 mmol/L — AB (ref 135–145)
Total Bilirubin: 0.7 mg/dL (ref 0.3–1.2)
Total Protein: 7.7 g/dL (ref 6.5–8.1)

## 2015-05-18 LAB — URINALYSIS, ROUTINE W REFLEX MICROSCOPIC
GLUCOSE, UA: NEGATIVE mg/dL
KETONES UR: 15 mg/dL — AB
Leukocytes, UA: NEGATIVE
NITRITE: NEGATIVE
PH: 6.5 (ref 5.0–8.0)
Protein, ur: >300 mg/dL — AB
SPECIFIC GRAVITY, URINE: 1.025 (ref 1.005–1.030)

## 2015-05-18 LAB — URINE MICROSCOPIC-ADD ON

## 2015-05-18 LAB — CBC
HEMATOCRIT: 37.1 % (ref 36.0–46.0)
HEMOGLOBIN: 12.7 g/dL (ref 12.0–15.0)
MCH: 24.1 pg — ABNORMAL LOW (ref 26.0–34.0)
MCHC: 34.2 g/dL (ref 30.0–36.0)
MCV: 70.4 fL — ABNORMAL LOW (ref 78.0–100.0)
Platelets: 247 10*3/uL (ref 150–400)
RBC: 5.27 MIL/uL — AB (ref 3.87–5.11)
RDW: 16.5 % — ABNORMAL HIGH (ref 11.5–15.5)
WBC: 9.2 10*3/uL (ref 4.0–10.5)

## 2015-05-18 MED ORDER — ACETAMINOPHEN 325 MG PO TABS: 650.0000 mg | ORAL_TABLET | ORAL | Status: DC | PRN

## 2015-05-18 MED ORDER — METOCLOPRAMIDE HCL 5 MG/ML IJ SOLN
10.0000 mg | Freq: Once | INTRAMUSCULAR | Status: AC
  Administered 2015-05-18: 10 mg via INTRAVENOUS
  Filled 2015-05-18: qty 2

## 2015-05-18 MED ORDER — PRENATAL MULTIVITAMIN CH
1.0000 | ORAL_TABLET | Freq: Every day | ORAL | Status: DC
Start: 2015-05-19 — End: 2015-05-21
  Administered 2015-05-19 – 2015-05-21 (×3): 1 via ORAL
  Filled 2015-05-18 (×4): qty 1

## 2015-05-18 MED ORDER — M.V.I. ADULT IV INJ
Freq: Once | INTRAVENOUS | Status: AC
  Administered 2015-05-18: 18:00:00 via INTRAVENOUS
  Filled 2015-05-18: qty 10

## 2015-05-18 MED ORDER — PANTOPRAZOLE SODIUM 40 MG IV SOLR
40.0000 mg | Freq: Every day | INTRAVENOUS | Status: DC
  Administered 2015-05-18 – 2015-05-20 (×3): 40 mg via INTRAVENOUS
  Filled 2015-05-18 (×4): qty 40

## 2015-05-18 MED ORDER — KCL IN DEXTROSE-NACL 20-5-0.45 MEQ/L-%-% IV SOLN
INTRAVENOUS | Status: DC
  Administered 2015-05-18 – 2015-05-21 (×8): via INTRAVENOUS
  Filled 2015-05-18 (×10): qty 1000

## 2015-05-18 MED ORDER — SODIUM CHLORIDE 0.9 % IV SOLN
8.0000 mg | Freq: Three times a day (TID) | INTRAVENOUS | Status: DC
  Administered 2015-05-18 – 2015-05-21 (×8): 8 mg via INTRAVENOUS
  Filled 2015-05-18 (×10): qty 4

## 2015-05-18 MED ORDER — CALCIUM CARBONATE ANTACID 500 MG PO CHEW
2.0000 | CHEWABLE_TABLET | ORAL | Status: DC | PRN
  Filled 2015-05-18: qty 2

## 2015-05-18 MED ORDER — METOCLOPRAMIDE HCL 5 MG/ML IJ SOLN
10.0000 mg | Freq: Three times a day (TID) | INTRAMUSCULAR | Status: DC
  Administered 2015-05-19 – 2015-05-21 (×7): 10 mg via INTRAVENOUS
  Filled 2015-05-18 (×7): qty 2

## 2015-05-18 MED ORDER — ZOLPIDEM TARTRATE 5 MG PO TABS: 5.0000 mg | ORAL_TABLET | Freq: Every evening | ORAL | Status: DC | PRN

## 2015-05-18 MED ORDER — DOCUSATE SODIUM 100 MG PO CAPS
100.0000 mg | ORAL_CAPSULE | Freq: Every day | ORAL | Status: DC
  Administered 2015-05-19 – 2015-05-21 (×3): 100 mg via ORAL
  Filled 2015-05-18 (×4): qty 1

## 2015-05-18 MED ORDER — POTASSIUM CHLORIDE 10 MEQ/100ML IV SOLN
10.0000 meq | INTRAVENOUS | Status: AC
  Administered 2015-05-18 (×2): 10 meq via INTRAVENOUS
  Filled 2015-05-18 (×2): qty 100

## 2015-05-18 MED ORDER — SODIUM CHLORIDE 0.9 % IV SOLN
8.0000 mg | Freq: Once | INTRAVENOUS | Status: AC
  Administered 2015-05-18: 8 mg via INTRAVENOUS
  Filled 2015-05-18: qty 4

## 2015-05-18 NOTE — MAU Provider Note (Signed)
Evelyn Marshall is a 30yo, G3P1101, at 11.1 wks presenting to MAU for nausea and vomiting.    Pt was seen in the office vomiting, with Ketones in urine. Pt states she cannot keep any food or drink down despite taking Diclegys, Reglan, Zofran, and Protonix.      History     Patient Active Problem List   Diagnosis Date Noted  . Ptyalism 05/18/2015  . Hyperemesis gravidarum 04/27/2015    Chief Complaint  Patient presents with  . Emesis During Pregnancy   HPI  OB History    Gravida Para Term Preterm AB TAB SAB Ectopic Multiple Living   3 2 1 1  0  0   1      Past Medical History  Diagnosis Date  . Abnormal Pap smear 2012    colpo result benign  . Pregnancy induced hypertension     Past Surgical History  Procedure Laterality Date  . Wisdom tooth extraction      History reviewed. No pertinent family history.  Social History  Substance Use Topics  . Smoking status: Never Smoker   . Smokeless tobacco: Never Used  . Alcohol Use: No    Allergies: No Known Allergies  Prescriptions prior to admission  Medication Sig Dispense Refill Last Dose  . alum & mag hydroxide-simeth (MAALOX/MYLANTA) 200-200-20 MG/5ML suspension Take 15 mLs by mouth every 6 (six) hours as needed for indigestion or heartburn.   Past Month at Unknown time  . Doxylamine-Pyridoxine 10-10 MG TBEC Take 2 tablets at bedtime, on an empty stomach.  Take 1 tablet prior to breakfast and 1 tablet prior to lunch. (Patient taking differently: Take 1-2 tablets by mouth 3 (three) times daily. Take 2 tablets at bedtime, on an empty stomach.  Take 1 tablet prior to breakfast and 1 tablet prior to lunch.) 60 tablet 6 05/18/2015 at Unknown time  . glycopyrrolate (ROBINUL) 1 MG tablet Take 1 tablet (1 mg total) by mouth 3 (three) times daily as needed. (Patient taking differently: Take 1 mg by mouth 3 (three) times daily as needed (spitting). ) 30 tablet 2 05/17/2015 at Unknown time  . metoCLOPramide (REGLAN) 10 MG tablet  Take 1 tablet (10 mg total) by mouth every 6 (six) hours as needed for nausea. 30 tablet 4 05/18/2015 at Unknown time  . ondansetron (ZOFRAN ODT) 8 MG disintegrating tablet Take 1 tablet (8 mg total) by mouth every 8 (eight) hours as needed for nausea or vomiting. 20 tablet 0 Past Week at Unknown time  . pantoprazole (PROTONIX) 40 MG tablet Take 1 tablet (40 mg total) by mouth daily. 30 tablet 2 05/17/2015 at Unknown time  . promethazine (PHENERGAN) 12.5 MG tablet Take 1 tablet (12.5 mg total) by mouth every 6 (six) hours as needed for nausea or vomiting. 30 tablet 0 Past Week at Unknown time    ROS Physical Exam   Blood pressure 134/89, pulse 95, temperature 98.1 F (36.7 C), temperature source Oral, resp. rate 18, weight 107.684 kg (237 lb 6.4 oz), last menstrual period 03/01/2015, SpO2 100 %.  Results for orders placed or performed during the hospital encounter of 05/18/15 (from the past 24 hour(s))  Urinalysis, Routine w reflex microscopic (not at [facility])     Status: Abnormal   Collection Time: 05/18/15  5:54 PM  Result Value Ref Range   Color, Urine YELLOW YELLOW   APPearance CLEAR CLEAR   Specific Gravity, Urine 1.025 1.005 - 1.030   pH 6.5 5.0 - 8.0   Glucose,  UA NEGATIVE NEGATIVE mg/dL   Hgb urine dipstick TRACE (A) NEGATIVE   Bilirubin Urine MODERATE (A) NEGATIVE   Ketones, ur 15 (A) NEGATIVE mg/dL   Protein, ur >300 (A) NEGATIVE mg/dL   Nitrite NEGATIVE NEGATIVE   Leukocytes, UA NEGATIVE NEGATIVE  Urine microscopic-add on     Status: Abnormal   Collection Time: 05/18/15  5:54 PM  Result Value Ref Range   Squamous Epithelial / LPF 0-5 (A) NONE SEEN   WBC, UA 0-5 0 - 5 WBC/hpf   RBC / HPF 0-5 0 - 5 RBC/hpf   Bacteria, UA FEW (A) NONE SEEN   Crystals CA OXALATE CRYSTALS (A) NEGATIVE   Urine-Other MUCOUS PRESENT   Comprehensive metabolic panel     Status: Abnormal   Collection Time: 05/18/15  6:14 PM  Result Value Ref Range   Sodium 132 (L) 135 - 145 mmol/L   Potassium 2.7  (LL) 3.5 - 5.1 mmol/L   Chloride 95 (L) 101 - 111 mmol/L   CO2 27 22 - 32 mmol/L   Glucose, Bld 97 65 - 99 mg/dL   BUN <5 (L) 6 - 20 mg/dL   Creatinine, Ser 0.60 0.44 - 1.00 mg/dL   Calcium 9.4 8.9 - 10.3 mg/dL   Total Protein 7.7 6.5 - 8.1 g/dL   Albumin 3.6 3.5 - 5.0 g/dL   AST 79 (H) 15 - 41 U/L   ALT 217 (H) 14 - 54 U/L   Alkaline Phosphatase 161 (H) 38 - 126 U/L   Total Bilirubin 0.7 0.3 - 1.2 mg/dL   GFR calc non Af Amer >60 >60 mL/min   GFR calc Af Amer >60 >60 mL/min   Anion gap 10 5 - 15  CBC     Status: Abnormal   Collection Time: 05/18/15  6:14 PM  Result Value Ref Range   WBC 9.2 4.0 - 10.5 K/uL   RBC 5.27 (H) 3.87 - 5.11 MIL/uL   Hemoglobin 12.7 12.0 - 15.0 g/dL   HCT 37.1 36.0 - 46.0 %   MCV 70.4 (L) 78.0 - 100.0 fL   MCH 24.1 (L) 26.0 - 34.0 pg   MCHC 34.2 30.0 - 36.0 g/dL   RDW 16.5 (H) 11.5 - 15.5 %   Platelets 247 150 - 400 K/uL    FHR:  169 bpm  Physical Exam  Constitutional: She is oriented to person, place, and time. She appears well-developed and well-nourished.  HENT:  Head: Normocephalic.  Eyes: Pupils are equal, round, and reactive to light.  Neck: Normal range of motion.  Cardiovascular: Normal rate and regular rhythm.   Respiratory: Effort normal and breath sounds normal.  GI: Soft.  Musculoskeletal: Normal range of motion.  Neurological: She is alert and oriented to person, place, and time. She has normal reflexes.  Skin: Skin is warm and dry.  Psychiatric: She has a normal mood and affect. Her behavior is normal. Judgment and thought content normal.    ED Course  Assessment: IUP at 11.1 wks Nausea Vomiting Dehydration, +15 ketones Hypoakalemia, 2.7   Plan: IV hydration, multivitamin adult  IV Zofran, 8 mg  20 mEq potassium PO challenge Report to oncoming provider K. Jimmye Norman, CNM  Lavetta Nielsen CNM, MSN 05/18/2015 7:58 PM

## 2015-05-18 NOTE — H&P (Signed)
Evelyn Marshall is a 30 y.o. female, G3P1011 at 11.1 weeks, presenting for N/V. Had NOB appt today, vomited there and was subsequently sent to MAU for fluids/meds. Reports frequent visits to MAU, approximately every 1 - 1 1/2 weeks since the end of March for IVFs and meds. Reglan, Diclegis, Phenergan and Zofran have been ineffective lately. Takes Protonix at night w/ some relief of the burning sensation she feels after she vomits. Unsure of last meal. States no BM in weeks. Attempted Ensure clear w/o success. Denies VB or change in vaginal discharge. 14-lb wt loss since conception.  Reports similar situation in last pregnancy; states had to have an NGT placed.   Patient Active Problem List   Diagnosis Date Noted  . Ptyalism 05/18/2015  . Chronic hypertension in pregnancy 05/18/2015  . Morbid obesity with BMI of 40.0-44.9, adult ([place]) 05/18/2015  . Rh negative, maternal 05/18/2015  . Hyperemesis gravidarum 04/27/2015    History of present pregnancy: Patient entered care at 8 weeks.   EDC of 12/06/15 was established by LMP and that was consistent w/ u/s at 11.4 wks.   11.4 wks (viability): TA images. Retroverted uterus. [place] intrauterine 107w4d IUP. Ovaries not visualized. Adnexas appear within normal limits.    Significant prenatal events: Received flu vaccine on 10/24/14. Hyperemesis since onset of pregnancy. Frequent MAU visits for same. Treated w/ IVFs and meds. 14-lb wt loss since the beginning of pregnancy.   Last evaluation: Office by SDJ, CNM on 05/18/15 - To MAU for fluids/meds due to vomiting in office and ongoing issues w/ hyperemesis.  OB History    Gravida Para Term Preterm AB TAB SAB Ectopic Multiple Living   3 2 1 1  0  0   1     SVB 10/28/2004 @ 38 wks; female infant, birthwt 8 lbs -- induced for HTN, no preE. IUFD at 25 wks on 03/18/2010; "fluid around lungs and brains" -- received [facility]   Past Medical History  Diagnosis Date  . Abnormal Pap smear  2012    colpo result benign  . Pregnancy induced hypertension    Past Surgical History  Procedure Laterality Date  . Wisdom tooth extraction     Family History: Significant for HTN in her parents, CHF in her PGM, COPD in her mother and heart failure in her father.   Social History:  reports that she has never smoked. She has never used smokeless tobacco. She reports that she does not drink alcohol or use illicit drugs. 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 She will accept blood in an emergency.    Prenatal Transfer Tool  Maternal Diabetes: No Genetic Screening: Not done Maternal Ultrasounds/Referrals: Normal viability scan Fetal Ultrasounds or other Referrals:  None Maternal Substance Abuse:  No Significant Maternal Medications:  Meds include: Other: Diclegis, glycopyrrolate, metoclopramide, ondansetron, pantoprazole, promethazine, robinul Significant Maternal Lab Results: Lab values include: Rh negative  TDAP: No record Flu: 10/24/2014  ROS:10 Systems reviewed and are negative for acute change except as noted in the HPI.   No Known Allergies     Blood pressure 136/81, pulse 86, temperature 98.5 F (36.9 C), temperature source Oral, resp. rate 18, height 5\' 4"  (1.626 m), weight 111.585 kg (246 lb), last menstrual period 03/01/2015, SpO2 100 %.  BPs 134-138/89-99  Chest clear Heart RRR without murmur Abd gravid, NT Ext: WNL  Doptones: 160s  Prenatal labs: ABO, Rh: --/--/  O NEG (04/26 0000) Antibody: NEG (04/26 0000) Rubella: Immune RPR: Neg (02/23/14) HBsAg: Negative (03/30 1825)  HIV: Neg (02/23/14) Sickle cell/Hgb electrophoresis: Normal study (04/26/15) Pap: Epithelial cell abnormality: ASC-US; HPV positive (04/01/15) - scheduled for colpo on 06/01/15 GC: Neg (04/01/15); (04/26/15) Chlamydia: Neg (04/01/15); (04/26/15) Genetic screenings: NA Other: hsv 1 & 2 glycoprotein  g Ab, IgG = negative (02/23/14). Neg urine culture - no group B strep (S. Agalactiae) isolated. Glucose 97 on 4/15, Acute Hep panel = neg. Hgb 11.1 at NOB   Assessment: IUP at 11.1 wks Hyperemesis gravidarum Chronic HTN Rh neg Morbid obesity Ptyalism  Plan: Admit to Women's Unit per consult with Dr. Cletis Media. Change IVFs to D51/2 NS w/ Potassium. Baseline preE labs. Optimize management for 24 hrs w/ scheduled Reglan and Zofran, then likely steroid taper. Of note Phenergan is on National back order. Continue Protonix IV at hs. Daily wts. QS FHT. NPO. BR w/ BRP.   Farrel Gordon, CNM 05/19/2015, 4:26 AM

## 2015-05-18 NOTE — MAU Note (Signed)
As seen in the office today, not keeping anything down.  + ketones in urine.

## 2015-05-19 ENCOUNTER — Encounter (HOSPITAL_COMMUNITY): Payer: Self-pay | Admitting: *Deleted

## 2015-05-19 LAB — COMPREHENSIVE METABOLIC PANEL
ALBUMIN: 2.6 g/dL — AB (ref 3.5–5.0)
ALK PHOS: 103 U/L (ref 38–126)
ALT: 133 U/L — AB (ref 14–54)
AST: 42 U/L — AB (ref 15–41)
Anion gap: 5 (ref 5–15)
BUN: <5 mg/dL — ABNORMAL LOW (ref 6–20)
CALCIUM: 8.4 mg/dL — AB (ref 8.9–10.3)
CHLORIDE: 103 mmol/L (ref 101–111)
CO2: 27 mmol/L (ref 22–32)
CREATININE: 0.56 mg/dL (ref 0.44–1.00)
GFR calc non Af Amer: >60 mL/min (ref >60)
GLUCOSE: 92 mg/dL (ref 65–99)
Potassium: 2.8 mmol/L — ABNORMAL LOW (ref 3.5–5.1)
SODIUM: 135 mmol/L (ref 135–145)
Total Bilirubin: 0.5 mg/dL (ref 0.3–1.2)
Total Protein: 5.7 g/dL — ABNORMAL LOW (ref 6.5–8.1)

## 2015-05-19 LAB — AMYLASE
Amylase: 113 U/L — ABNORMAL HIGH (ref 28–100)
Amylase: 94 U/L (ref 28–100)

## 2015-05-19 LAB — CBC
HCT: 29.7 % — ABNORMAL LOW (ref 36.0–46.0)
HEMOGLOBIN: 9.9 g/dL — AB (ref 12.0–15.0)
MCH: 23.7 pg — ABNORMAL LOW (ref 26.0–34.0)
MCHC: 33.3 g/dL (ref 30.0–36.0)
MCV: 71.2 fL — ABNORMAL LOW (ref 78.0–100.0)
PLATELETS: 277 10*3/uL (ref 150–400)
RBC: 4.17 MIL/uL (ref 3.87–5.11)
RDW: 16.8 % — ABNORMAL HIGH (ref 11.5–15.5)
WBC: 6.2 10*3/uL (ref 4.0–10.5)

## 2015-05-19 LAB — TSH: TSH: 1.226 u[IU]/mL (ref 0.350–4.500)

## 2015-05-19 LAB — LIPASE, BLOOD
LIPASE: 41 U/L (ref 11–51)
LIPASE: 57 U/L — AB (ref 11–51)

## 2015-05-19 LAB — PROTEIN / CREATININE RATIO, URINE
CREATININE, URINE: 207 mg/dL
Creatinine, Urine: 606 mg/dL
PROTEIN CREATININE RATIO: 0.78 mg/mg{creat} — AB (ref 0.00–0.15)
PROTEIN CREATININE RATIO: 0.09 mg/mg{creat} (ref 0.00–0.15)
TOTAL PROTEIN, URINE: 18 mg/dL
Total Protein, Urine: 470 mg/dL

## 2015-05-19 LAB — PREALBUMIN: PREALBUMIN: 12.7 mg/dL — AB (ref 18–38)

## 2015-05-19 LAB — T4, FREE: Free T4: 1.38 ng/dL — ABNORMAL HIGH (ref 0.61–1.12)

## 2015-05-19 LAB — TYPE AND SCREEN
ABO/RH(D): O NEG
ANTIBODY SCREEN: NEGATIVE

## 2015-05-19 LAB — URIC ACID: URIC ACID, SERUM: 6.1 mg/dL (ref 2.3–6.6)

## 2015-05-19 LAB — LACTATE DEHYDROGENASE: LDH: 116 U/L (ref 98–192)

## 2015-05-19 MED ORDER — M.V.I. ADULT IV INJ
Freq: Once | INTRAVENOUS | Status: AC
  Administered 2015-05-19: 16:00:00 via INTRAVENOUS
  Filled 2015-05-19: qty 10

## 2015-05-19 MED ORDER — GLYCOPYRROLATE 1 MG PO TABS
2.0000 mg | ORAL_TABLET | Freq: Three times a day (TID) | ORAL | Status: DC | PRN
Start: 2015-05-19 — End: 2015-05-21
  Administered 2015-05-19 – 2015-05-21 (×3): 2 mg via ORAL
  Filled 2015-05-19 (×4): qty 2

## 2015-05-19 MED ORDER — POTASSIUM CHLORIDE CRYS ER 20 MEQ PO TBCR
20.0000 meq | EXTENDED_RELEASE_TABLET | Freq: Once | ORAL | Status: AC
  Administered 2015-05-19: 20 meq via ORAL
  Filled 2015-05-19: qty 1

## 2015-05-19 NOTE — Progress Notes (Signed)
Patient tolerated italian ice and vegetable broth for dinner.

## 2015-05-19 NOTE — Progress Notes (Signed)
Attempted to assess FHT this am, unsuccessful. We were able to hear fetal movement. Pt states this is what happened in office prior to being admitted here, and U/S was done. MD is aware. [name]

## 2015-05-19 NOTE — Progress Notes (Signed)
Results for orders placed or performed during the hospital encounter of 05/18/15 (from the past 24 hour(s))  Urinalysis, Routine w reflex microscopic (not at [facility])     Status: Abnormal   Collection Time: 05/18/15  5:54 PM  Result Value Ref Range   Color, Urine YELLOW YELLOW   APPearance CLEAR CLEAR   Specific Gravity, Urine 1.025 1.005 - 1.030   pH 6.5 5.0 - 8.0   Glucose, UA NEGATIVE NEGATIVE mg/dL   Hgb urine dipstick TRACE (A) NEGATIVE   Bilirubin Urine MODERATE (A) NEGATIVE   Ketones, ur 15 (A) NEGATIVE mg/dL   Protein, ur >300 (A) NEGATIVE mg/dL   Nitrite NEGATIVE NEGATIVE   Leukocytes, UA NEGATIVE NEGATIVE  Urine microscopic-add on     Status: Abnormal   Collection Time: 05/18/15  5:54 PM  Result Value Ref Range   Squamous Epithelial / LPF 0-5 (A) NONE SEEN   WBC, UA 0-5 0 - 5 WBC/hpf   RBC / HPF 0-5 0 - 5 RBC/hpf   Bacteria, UA FEW (A) NONE SEEN   Crystals CA OXALATE CRYSTALS (A) NEGATIVE   Urine-Other MUCOUS PRESENT   Protein / creatinine ratio, urine     Status: Abnormal   Collection Time: 05/18/15  5:54 PM  Result Value Ref Range   Creatinine, Urine 606.00 mg/dL   Total Protein, Urine 470 mg/dL   Protein Creatinine Ratio 0.78 (H) 0.00 - 0.15 mg/mg[Cre]  Comprehensive metabolic panel     Status: Abnormal   Collection Time: 05/18/15  6:14 PM  Result Value Ref Range   Sodium 132 (L) 135 - 145 mmol/L   Potassium 2.7 (LL) 3.5 - 5.1 mmol/L   Chloride 95 (L) 101 - 111 mmol/L   CO2 27 22 - 32 mmol/L   Glucose, Bld 97 65 - 99 mg/dL   BUN <5 (L) 6 - 20 mg/dL   Creatinine, Ser 0.60 0.44 - 1.00 mg/dL   Calcium 9.4 8.9 - 10.3 mg/dL   Total Protein 7.7 6.5 - 8.1 g/dL   Albumin 3.6 3.5 - 5.0 g/dL   AST 79 (H) 15 - 41 U/L   ALT 217 (H) 14 - 54 U/L   Alkaline Phosphatase 161 (H) 38 - 126 U/L   Total Bilirubin 0.7 0.3 - 1.2 mg/dL   GFR calc non Af Amer >60 >60 mL/min   GFR calc Af Amer >60 >60 mL/min   Anion gap 10 5 - 15  CBC     Status: Abnormal   Collection Time:  05/18/15  6:14 PM  Result Value Ref Range   WBC 9.2 4.0 - 10.5 K/uL   RBC 5.27 (H) 3.87 - 5.11 MIL/uL   Hemoglobin 12.7 12.0 - 15.0 g/dL   HCT 37.1 36.0 - 46.0 %   MCV 70.4 (L) 78.0 - 100.0 fL   MCH 24.1 (L) 26.0 - 34.0 pg   MCHC 34.2 30.0 - 36.0 g/dL   RDW 16.5 (H) 11.5 - 15.5 %   Platelets 247 150 - 400 K/uL  Type and screen [place]     Status: None   Collection Time: 05/19/15 12:00 AM  Result Value Ref Range   ABO/RH(D) O NEG    Antibody Screen NEG    Sample Expiration 05/22/2015   T4, free     Status: Abnormal   Collection Time: 05/19/15 12:06 AM  Result Value Ref Range   Free T4 1.38 (H) 0.61 - 1.12 ng/dL  TSH     Status: None  Collection Time: 05/19/15 12:06 AM  Result Value Ref Range   TSH 1.226 0.350 - 4.500 uIU/mL  Prealbumin     Status: Abnormal   Collection Time: 05/19/15 12:06 AM  Result Value Ref Range   Prealbumin 12.7 (L) 18 - 38 mg/dL  Lipase, blood     Status: Abnormal   Collection Time: 05/19/15 12:06 AM  Result Value Ref Range   Lipase 57 (H) 11 - 51 U/L  Amylase     Status: Abnormal   Collection Time: 05/19/15 12:06 AM  Result Value Ref Range   Amylase 113 (H) 28 - 100 U/L  Lactate dehydrogenase     Status: None   Collection Time: 05/19/15  7:23 AM  Result Value Ref Range   LDH 116 98 - 192 U/L  CBC     Status: Abnormal   Collection Time: 05/19/15  7:23 AM  Result Value Ref Range   WBC 6.2 4.0 - 10.5 K/uL   RBC 4.17 3.87 - 5.11 MIL/uL   Hemoglobin 9.9 (L) 12.0 - 15.0 g/dL   HCT 29.7 (L) 36.0 - 46.0 %   MCV 71.2 (L) 78.0 - 100.0 fL   MCH 23.7 (L) 26.0 - 34.0 pg   MCHC 33.3 30.0 - 36.0 g/dL   RDW 16.8 (H) 11.5 - 15.5 %   Platelets 277 150 - 400 K/uL    All abnormal values discussed w/ Dr. Cletis Media -- will proceed w/ repeating labs this morning.    Farrel Gordon, CNM 05/19/15, 7:30 AM

## 2015-05-19 NOTE — Progress Notes (Signed)
Pt was advanced to clear liquids this afternoon and she tolerated very well. She was able to keep down one ginger ale, 12oz of water, and one bowl of broth. Evelyn Marshall

## 2015-05-19 NOTE — Progress Notes (Signed)
30 y/o G2P0111 @ 11 weeks 1 day EGA with hyperemesis gravidarum.  HD # 2.    Subjective: Patient reports she feels better. She feels her nausea has improved and she hasn't vomited recently.  She continues with spitting.  She denies abdominal pain or vaginal bleeding.   Objective:  Filed Vitals:   05/19/15 0040 05/19/15 0459 05/19/15 0505 05/19/15 1200  BP: 136/81  122/84 116/78  Pulse: 86  99 88  Temp: 98.5 F (36.9 C)  98.2 F (36.8 C) 98.2 F (36.8 C)  TempSrc: Oral  Oral Oral  Resp: 18  16 18   Height: 5\' 4"  (1.626 m)     Weight: 111.585 kg (246 lb) 111.585 kg (246 lb)    SpO2: 100%  100% 99%     Net Input/output:4/25: 0701- 1900:  -370cc.  General: alert, cooperative and no distress Resp: clear to auscultation bilaterally Cardio: regular rate and rhythm, S1, S2 normal, no murmur, click, rub or gallop GI: soft, non-tender; bowel sounds normal; no masses,  no organomegaly Extremities: extremities normal, atraumatic, no cyanosis or edema   CBC    Component Value Date/Time   WBC 6.2 05/19/2015 0723   RBC 4.17 05/19/2015 0723   HGB 9.9* 05/19/2015 0723   HCT 29.7* 05/19/2015 0723   PLT 277 05/19/2015 0723   MCV 71.2* 05/19/2015 0723   MCH 23.7* 05/19/2015 0723   MCHC 33.3 05/19/2015 0723   RDW 16.8* 05/19/2015 0723   LYMPHSABS 1.5 05/08/2015 2225   MONOABS 0.8 05/08/2015 2225   EOSABS 0.0 05/08/2015 2225   BASOSABS 0.0 05/08/2015 2225   CMP     Component Value Date/Time   NA 135 05/19/2015 0728   K 2.8* 05/19/2015 0728   CL 103 05/19/2015 0728   CO2 27 05/19/2015 0728   GLUCOSE 92 05/19/2015 0728   BUN <5* 05/19/2015 0728   CREATININE 0.56 05/19/2015 0728   CALCIUM 8.4* 05/19/2015 0728   PROT 5.7* 05/19/2015 0728   ALBUMIN 2.6* 05/19/2015 0728   AST 42* 05/19/2015 0728   ALT 133* 05/19/2015 0728   ALKPHOS 103 05/19/2015 0728   BILITOT 0.5 05/19/2015 0728   GFRNONAA >60 05/19/2015 0728   GFRAA >60 05/19/2015 0728   Amylase    Component Value  Date/Time   AMYLASE 94 05/19/2015 0728   Lipase     Component Value Date/Time   LIPASE 41 05/19/2015 0728   Urine spot protein creatinine ratio 05/19/15: 0.09.     Current facility-administered medications:  .  acetaminophen (TYLENOL) tablet 650 mg, 650 mg, Oral, Q4H PRN, Farrel Gordon, CNM .  calcium carbonate (TUMS - dosed in mg elemental calcium) chewable tablet 400 mg of elemental calcium, 2 tablet, Oral, Q4H PRN, Farrel Gordon, CNM .  dextrose 5 % and 0.45 % NaCl with KCl 20 mEq/L infusion, , Intravenous, Continuous, Farrel Gordon, CNM, Last Rate: 125 mL/hr at 05/19/15 0755 .  docusate sodium (COLACE) capsule 100 mg, 100 mg, Oral, Daily, Farrel Gordon, CNM, 100 mg at 05/19/15 1029 .  glycopyrrolate (ROBINUL) tablet 2 mg, 2 mg, Oral, TID PRN, Waymon Amato, MD, 2 mg at 05/19/15 1555 .  metoCLOPramide (REGLAN) injection 10 mg, 10 mg, Intravenous, Q8H, Farrel Gordon, CNM, 10 mg at 05/19/15 1503 .  multivitamins adult (MVI -12) 10 mL in dextrose 5% lactated ringers 1,000 mL infusion, , Intravenous, Once, Waymon Amato, MD .  ondansetron (ZOFRAN) 8 mg in sodium chloride 0.9 % 50 mL IVPB, 8 mg, Intravenous, Q8H, Farrel Gordon, CNM, 8  mg at 05/19/15 1550 .  pantoprazole (PROTONIX) injection 40 mg, 40 mg, Intravenous, QHS, Farrel Gordon, CNM, 40 mg at 05/18/15 2354 .  prenatal multivitamin tablet 1 tablet, 1 tablet, Oral, Q1200, Farrel Gordon, CNM, 1 tablet at 05/19/15 1303 .  zolpidem (AMBIEN) tablet 5 mg, 5 mg, Oral, QHS PRN, Farrel Gordon, CNM  Assessment/Plan:  30 y/o G2P0111 @ 11 weeks 1 day EGA with hyperemesis gravidarum on IV antiemetics, likely with history of chronic hypertension, repeat urine spot protein creatinine ratio was normal.  -Kdur for low potassium, lab recheck tomorrow.  -Multivitamin IV bag. -Clears diet -Robinul for ptyalism -Follow up on down trending LFTs tomorrow.      LOS: 1 day    [facility] 05/19/2015, 2:28 PM

## 2015-05-20 LAB — COMPREHENSIVE METABOLIC PANEL
ALT: 110 U/L — ABNORMAL HIGH (ref 14–54)
AST: 34 U/L (ref 15–41)
Albumin: 2.6 g/dL — ABNORMAL LOW (ref 3.5–5.0)
Alkaline Phosphatase: 104 U/L (ref 38–126)
Anion gap: 6 (ref 5–15)
BILIRUBIN TOTAL: 0.7 mg/dL (ref 0.3–1.2)
CHLORIDE: 104 mmol/L (ref 101–111)
CO2: 26 mmol/L (ref 22–32)
CREATININE: 0.61 mg/dL (ref 0.44–1.00)
Calcium: 8.5 mg/dL — ABNORMAL LOW (ref 8.9–10.3)
Glucose, Bld: 81 mg/dL (ref 65–99)
POTASSIUM: 3.2 mmol/L — AB (ref 3.5–5.1)
Sodium: 136 mmol/L (ref 135–145)
TOTAL PROTEIN: 6 g/dL — AB (ref 6.5–8.1)

## 2015-05-20 LAB — T3, FREE: T3, Free: 3.4 pg/mL (ref 2.0–4.4)

## 2015-05-20 NOTE — Progress Notes (Signed)
Pt started excess spitting after first meal of soft foods. Pt. Vomited 463ml after second attempt at soft foods continues to have excess spitting.

## 2015-05-20 NOTE — Progress Notes (Signed)
Antepartum LOS: 2 Evelyn Marshall, 30 y.o.,   OB History    Gravida Para Term Preterm AB TAB SAB Ectopic Multiple Living   3 2 1 1  0  0   1      Subjective -Reports eating chicken broth with good results.  No bowel movements or passing of flatus.  Reports urine looks "good" and is not completely clear, but a light yellow. States spitting has improved with robinul dosing this morning.  Feels confident with advancing diet to bland.  Denies VB.  Objective  Filed Vitals:   05/19/15 1200 05/19/15 1800 05/19/15 2154 05/20/15 0503  BP: 116/78 131/76 118/68 111/73  Pulse: 88 106 89 91  Temp: 98.2 F (36.8 C) 98.3 F (36.8 C) 98.5 F (36.9 C) 98.5 F (36.9 C)  TempSrc: Oral Oral Oral Oral  Resp: 18 22 18 16   Height:      Weight:    113.399 kg (250 lb)  SpO2: 99% 100% 100% 97%    Results for orders placed or performed during the hospital encounter of 05/18/15 (from the past 24 hour(s))  Protein / creatinine ratio, urine     Status: None   Collection Time: 05/19/15  9:00 AM  Result Value Ref Range   Creatinine, Urine 207.00 mg/dL   Total Protein, Urine 18 mg/dL   Protein Creatinine Ratio 0.09 0.00 - 0.15 mg/mg[Cre]  Comprehensive metabolic panel     Status: Abnormal   Collection Time: 05/20/15  5:59 AM  Result Value Ref Range   Sodium 136 135 - 145 mmol/L   Potassium 3.2 (L) 3.5 - 5.1 mmol/L   Chloride 104 101 - 111 mmol/L   CO2 26 22 - 32 mmol/L   Glucose, Bld 81 65 - 99 mg/dL   BUN <5 (L) 6 - 20 mg/dL   Creatinine, Ser 0.61 0.44 - 1.00 mg/dL   Calcium 8.5 (L) 8.9 - 10.3 mg/dL   Total Protein 6.0 (L) 6.5 - 8.1 g/dL   Albumin 2.6 (L) 3.5 - 5.0 g/dL   AST 34 15 - 41 U/L   ALT 110 (H) 14 - 54 U/L   Alkaline Phosphatase 104 38 - 126 U/L   Total Bilirubin 0.7 0.3 - 1.2 mg/dL   GFR calc non Af Amer >60 >60 mL/min   GFR calc Af Amer >60 >60 mL/min   Anion gap 6 5 - 15    Meds: Scheduled Meds: . docusate sodium  100 mg Oral Daily  . metoCLOPramide (REGLAN) injection  10 mg  Intravenous Q8H  . ondansetron (ZOFRAN) IV  8 mg Intravenous Q8H  . pantoprazole (PROTONIX) IV  40 mg Intravenous QHS  . prenatal multivitamin  1 tablet Oral Q1200   Continuous Infusions: . dextrose 5 % and 0.45 % NaCl with KCl 20 mEq/L 125 mL/hr at 05/20/15 0233   PRN Meds:.acetaminophen, calcium carbonate, glycopyrrolate, zolpidem   Physical Exam  Constitutional: She appears well-developed. No distress.  Obese  HENT:  Head: Normocephalic and atraumatic.  Eyes: Conjunctivae are normal.  Neck: Normal range of motion.  Cardiovascular: Normal rate, regular rhythm and normal heart sounds.   Pulmonary/Chest: Effort normal and breath sounds normal.  Abdominal: Soft. Bowel sounds are normal.  Musculoskeletal: Normal range of motion.  Neurological: She is alert.  Skin: Skin is warm and dry.  :   Assessment IUP at [redacted]w[redacted]d HyperEmesis  Plan Advance diet to bland/soft Continue current plan of care Upcoming Treatments/Tests: None Dr.SR to follow as appropriate   Janett Billow  Carolan Clines, MSN, CNM 05/20/2015, 8:58 AM

## 2015-05-20 NOTE — Progress Notes (Signed)
Initial Nutrition Assessment  DOCUMENTATION CODES:   Morbid obesity  INTERVENTION:  C/L diet to be advanced to soft today Extensive discussion with pt about home diet/ supplements  NUTRITION DIAGNOSIS:   Inadequate oral intake related to nausea, vomiting as evidenced by percent weight loss.   GOAL:   Weight gain, tol of po diet  MONITOR:  Weight trends  REASON FOR ASSESSMENT:  Malnutrition Screening Tool   ASSESSMENT:  11 weeks with hyperemesis, 4 MAU adm prior to this adm. Hyperemesis with prev preg requireing enteral support. Reprted to not tolerate Ensure clear. May wish to try this again mixed with ginger ale. Pt had expericned a 7 % loss of usual weight, but is now down 2 % of usual weight. Tolerated some C/L yesterday and again this AM. Discussed options for lunch today, encouraged small freq meals. Pt feels as if she has a very slow stomach emptying  Diet Order:  DIET SOFT Room service appropriate?: Yes; Fluid consistency:: Thin  Skin:    , reviewed no issues Height:   Ht Readings from Last 1 Encounters:  05/19/15 5\' 4"  (1.626 m)    Weight:   Wt Readings from Last 1 Encounters:  05/20/15 250 lb (113.399 kg)    Ideal Body Weight:  54.5 kg  BMI:  Body mass index is 42.89 kg/(m^2).  Estimated Nutritional Needs:   Kcal:  2100-2300  Protein:  85-95 g  Fluid:  2.4 L  EDUCATION NEEDS:   No education needs identified at this time  Cathlean Sauer.Fredderick Severance LDN Neonatal Nutrition Support Specialist/RD III Pager [phone]      Phone [phone]

## 2015-05-21 NOTE — Progress Notes (Signed)
Pt escorted to main entrance accompanied by NT. Pts fiance is driving her home. Evelyn Marshall

## 2015-05-21 NOTE — Discharge Summary (Signed)
Physician Discharge Summary  Patient ID: Evelyn Marshall MRN: [id number] DOB/AGE: [birth year] 30 y.o.  Admit date: 05/18/2015 Discharge date: 05/21/2015  Admission Diagnoses: Hyperemesis gravidarum  Discharge Diagnoses:  Principal Problem:   Hyperemesis gravidarum Active Problems:   Ptyalism   Chronic hypertension in pregnancy   Morbid obesity with BMI of 40.0-44.9, adult ([place])   Rh negative, maternal   Discharged Condition: stable  Hospital Course: IV Hydration, PO AntiEmetics Medication  Consults: None  Significant Diagnostic Studies:  Results for orders placed or performed during the hospital encounter of 05/18/15 (from the past 72 hour(s))  Type and screen [place]     Status: None   Collection Time: 05/19/15 12:00 AM  Result Value Ref Range   ABO/RH(D) O NEG    Antibody Screen NEG    Sample Expiration 05/22/2015   T3, free     Status: None   Collection Time: 05/19/15 12:05 AM  Result Value Ref Range   T3, Free 3.4 2.0 - 4.4 pg/mL    Comment: (NOTE) Performed At: [facility] [address] [place], [place] [id number] Lindon Romp MD DX:[id number]   T4, free     Status: Abnormal   Collection Time: 05/19/15 12:06 AM  Result Value Ref Range   Free T4 1.38 (H) 0.61 - 1.12 ng/dL    Comment: Performed at [facility]  TSH     Status: None   Collection Time: 05/19/15 12:06 AM  Result Value Ref Range   TSH 1.226 0.350 - 4.500 uIU/mL    Comment: Performed at [facility]  Prealbumin     Status: Abnormal   Collection Time: 05/19/15 12:06 AM  Result Value Ref Range   Prealbumin 12.7 (L) 18 - 38 mg/dL    Comment: Performed at [facility]  Lipase, blood     Status: Abnormal   Collection Time: 05/19/15 12:06 AM  Result Value Ref Range   Lipase 57 (H) 11 - 51 U/L  Amylase     Status: Abnormal   Collection Time: 05/19/15 12:06 AM  Result Value Ref Range   Amylase 113 (H) 28 - 100 U/L  Lactate dehydrogenase      Status: None   Collection Time: 05/19/15  7:23 AM  Result Value Ref Range   LDH 116 98 - 192 U/L  CBC     Status: Abnormal   Collection Time: 05/19/15  7:23 AM  Result Value Ref Range   WBC 6.2 4.0 - 10.5 K/uL   RBC 4.17 3.87 - 5.11 MIL/uL   Hemoglobin 9.9 (L) 12.0 - 15.0 g/dL    Comment: REPEATED TO VERIFY DELTA CHECK NOTED    HCT 29.7 (L) 36.0 - 46.0 %   MCV 71.2 (L) 78.0 - 100.0 fL   MCH 23.7 (L) 26.0 - 34.0 pg   MCHC 33.3 30.0 - 36.0 g/dL   RDW 16.8 (H) 11.5 - 15.5 %   Platelets 277 150 - 400 K/uL  Comprehensive metabolic panel     Status: Abnormal   Collection Time: 05/19/15  7:28 AM  Result Value Ref Range   Sodium 135 135 - 145 mmol/L   Potassium 2.8 (L) 3.5 - 5.1 mmol/L   Chloride 103 101 - 111 mmol/L   CO2 27 22 - 32 mmol/L   Glucose, Bld 92 65 - 99 mg/dL   BUN <5 (L) 6 - 20 mg/dL    Comment: REPEATED TO VERIFY   Creatinine, Ser 0.56 0.44 - 1.00  mg/dL   Calcium 8.4 (L) 8.9 - 10.3 mg/dL   Total Protein 5.7 (L) 6.5 - 8.1 g/dL   Albumin 2.6 (L) 3.5 - 5.0 g/dL   AST 42 (H) 15 - 41 U/L   ALT 133 (H) 14 - 54 U/L   Alkaline Phosphatase 103 38 - 126 U/L   Total Bilirubin 0.5 0.3 - 1.2 mg/dL   GFR calc non Af Amer >60 >60 mL/min   GFR calc Af Amer >60 >60 mL/min    Comment: (NOTE) The eGFR has been calculated using the CKD EPI equation. This calculation has not been validated in all clinical situations. eGFR's persistently <60 mL/min signify possible Chronic Kidney Disease.    Anion gap 5 5 - 15  Amylase     Status: None   Collection Time: 05/19/15  7:28 AM  Result Value Ref Range   Amylase 94 28 - 100 U/L  Lipase, blood     Status: None   Collection Time: 05/19/15  7:28 AM  Result Value Ref Range   Lipase 41 11 - 51 U/L  Uric acid     Status: None   Collection Time: 05/19/15  7:28 AM  Result Value Ref Range   Uric Acid, Serum 6.1 2.3 - 6.6 mg/dL  Protein / creatinine ratio, urine     Status: None   Collection Time: 05/19/15  9:00 AM  Result Value Ref Range    Creatinine, Urine 207.00 mg/dL   Total Protein, Urine 18 mg/dL    Comment: NO NORMAL RANGE ESTABLISHED FOR THIS TEST   Protein Creatinine Ratio 0.09 0.00 - 0.15 mg/mg[Cre]  Comprehensive metabolic panel     Status: Abnormal   Collection Time: 05/20/15  5:59 AM  Result Value Ref Range   Sodium 136 135 - 145 mmol/L   Potassium 3.2 (L) 3.5 - 5.1 mmol/L   Chloride 104 101 - 111 mmol/L   CO2 26 22 - 32 mmol/L   Glucose, Bld 81 65 - 99 mg/dL   BUN <5 (L) 6 - 20 mg/dL    Comment: REPEATED TO VERIFY   Creatinine, Ser 0.61 0.44 - 1.00 mg/dL   Calcium 8.5 (L) 8.9 - 10.3 mg/dL   Total Protein 6.0 (L) 6.5 - 8.1 g/dL   Albumin 2.6 (L) 3.5 - 5.0 g/dL   AST 34 15 - 41 U/L   ALT 110 (H) 14 - 54 U/L   Alkaline Phosphatase 104 38 - 126 U/L   Total Bilirubin 0.7 0.3 - 1.2 mg/dL   GFR calc non Af Amer >60 >60 mL/min   GFR calc Af Amer >60 >60 mL/min    Comment: (NOTE) The eGFR has been calculated using the CKD EPI equation. This calculation has not been validated in all clinical situations. eGFR's persistently <60 mL/min signify possible Chronic Kidney Disease.    Anion gap 6 5 - 15     Treatments: IV hydration  Discharge Exam: Blood pressure 134/87, pulse 81, temperature 98 F (36.7 C), temperature source Oral, resp. rate 18, height _0  (1.626 m), weight 114.306 kg (252 lb), last menstrual period 03/01/2015, SpO2 98 %. General appearance: alert, cooperative and no distress Chest wall: no tenderness Cardio: regular rate and rhythm GI: normal findings: bowel sounds normal and soft, non-tender Extremities: extremities normal, atraumatic, no cyanosis or edema Skin: normal and no edema  Disposition: 01-Home or Self Care     Medication List    ASK your doctor about these medications  alum & mag hydroxide-simeth 200-200-20 MG/5ML suspension  Commonly known as:  MAALOX/MYLANTA  Take 15 mLs by mouth every 6 (six) hours as needed for indigestion or heartburn.      Doxylamine-Pyridoxine 10-10 MG Tbec  Take 2 tablets at bedtime, on an empty stomach.  Take 1 tablet prior to breakfast and 1 tablet prior to lunch.     glycopyrrolate 1 MG tablet  Commonly known as:  ROBINUL  Take 1 tablet (1 mg total) by mouth 3 (three) times daily as needed.     metoCLOPramide 10 MG tablet  Commonly known as:  REGLAN  Take 1 tablet (10 mg total) by mouth every 6 (six) hours as needed for nausea.     ondansetron 8 MG disintegrating tablet  Commonly known as:  ZOFRAN ODT  Take 1 tablet (8 mg total) by mouth every 8 (eight) hours as needed for nausea or vomiting.     pantoprazole 40 MG tablet  Commonly known as:  PROTONIX  Take 1 tablet (40 mg total) by mouth daily.     promethazine 12.5 MG tablet  Commonly known as:  PHENERGAN  Take 1 tablet (12.5 mg total) by mouth every 6 (six) hours as needed for nausea or vomiting.           Follow-up Information    Follow up with [facility]. Schedule an appointment as soon as possible for a visit in 2 weeks.   Specialty:  Obstetrics and Gynecology   Why:  Please call if you have any questions or concerns prior to your next visit.   Contact information:   [address] [place] [address] [phone]      Signed: Maryann Conners MSN, CNM 05/21/2015, 1:32 PM

## 2015-05-21 NOTE — Discharge Instructions (Signed)
Food Choices for Gastroesophageal Reflux Disease, Adult When you have gastroesophageal reflux disease (GERD), the foods you eat and your eating habits are very important. Choosing the right foods can help ease the discomfort of GERD. WHAT GENERAL GUIDELINES DO I NEED TO FOLLOW?  Choose fruits, vegetables, whole grains, low-fat dairy products, and low-fat meat, fish, and poultry.  Limit fats such as oils, salad dressings, butter, nuts, and avocado.  Keep a food diary to identify foods that cause symptoms.  Avoid foods that cause reflux. These may be different for different people.  Eat frequent small meals instead of three large meals each day.  Eat your meals slowly, in a relaxed setting.  Limit fried foods.  Cook foods using methods other than frying.  Avoid drinking alcohol.  Avoid drinking large amounts of liquids with your meals.  Avoid bending over or lying down until 2-3 hours after eating. WHAT FOODS ARE NOT RECOMMENDED? The following are some foods and drinks that may worsen your symptoms: Vegetables Tomatoes. Tomato juice. Tomato and spaghetti sauce. Chili peppers. Onion and garlic. Horseradish. Fruits Oranges, grapefruit, and lemon (fruit and juice). Meats High-fat meats, fish, and poultry. This includes hot dogs, ribs, ham, sausage, salami, and bacon. Dairy Whole milk and chocolate milk. Sour cream. Cream. Butter. Ice cream. Cream cheese.  Beverages Coffee and tea, with or without caffeine. Carbonated beverages or energy drinks. Condiments Hot sauce. Barbecue sauce.  Sweets/Desserts Chocolate and cocoa. Donuts. Peppermint and spearmint. Fats and Oils High-fat foods, including French fries and potato chips. Other Vinegar. Strong spices, such as [place] pepper, white pepper, red pepper, cayenne, curry powder, cloves, ginger, and chili powder. The items listed above may not be a complete list of foods and beverages to avoid. Contact your dietitian for more  information.   This information is not intended to replace advice given to you by your health care provider. Make sure you discuss any questions you have with your health care provider.   Document Released: 01/09/2005 Document Revised: 01/30/2014 Document Reviewed: 11/13/2012 Elsevier Interactive Patient Education 2016 Elsevier Inc.  

## 2015-05-21 NOTE — Progress Notes (Signed)
Pt verbalizes understanding of d/c instructions, medications, follow up appts, when to seek medical attention, and belongings policy. Pt has no questions at this time. Pt was given a copy of d/c instructions and has no new prescriptions. Pts IV was previously d/c by [facility] without complications. Pt is waiting for her fiance to arrive and then will be escorted out. [name]

## 2015-06-10 ENCOUNTER — Encounter (HOSPITAL_COMMUNITY): Payer: Self-pay | Admitting: *Deleted

## 2015-06-10 ENCOUNTER — Inpatient Hospital Stay (EMERGENCY_DEPARTMENT_HOSPITAL)
Admission: AD | Admit: 2015-06-10 | Discharge: 2015-06-10 | Disposition: A | Discharge status: Home / Self Care | Payer: BC Managed Care – PPO | Source: Ambulatory Visit | Attending: Obstetrics and Gynecology | Admitting: Obstetrics and Gynecology

## 2015-06-10 DIAGNOSIS — O210 Mild hyperemesis gravidarum: Secondary | ICD-10-CM | POA: Insufficient documentation

## 2015-06-10 DIAGNOSIS — Z3A14 14 weeks gestation of pregnancy: Secondary | ICD-10-CM

## 2015-06-10 DIAGNOSIS — K219 Gastro-esophageal reflux disease without esophagitis: Secondary | ICD-10-CM | POA: Insufficient documentation

## 2015-06-10 DIAGNOSIS — O219 Vomiting of pregnancy, unspecified: Secondary | ICD-10-CM

## 2015-06-10 DIAGNOSIS — K117 Disturbances of salivary secretion: Secondary | ICD-10-CM

## 2015-06-10 LAB — URINALYSIS, ROUTINE W REFLEX MICROSCOPIC
BILIRUBIN URINE: NEGATIVE
GLUCOSE, UA: NEGATIVE mg/dL
Ketones, ur: >80 mg/dL — AB
Leukocytes, UA: NEGATIVE
Nitrite: NEGATIVE
Protein, ur: >300 mg/dL — AB
pH: 6.5 (ref 5.0–8.0)

## 2015-06-10 LAB — URINE MICROSCOPIC-ADD ON

## 2015-06-10 LAB — PROTEIN / CREATININE RATIO, URINE
Creatinine, Urine: 141 mg/dL
Protein Creatinine Ratio: 2.1 mg/mg{Cre} — ABNORMAL HIGH (ref 0.00–0.15)
Total Protein, Urine: 296 mg/dL

## 2015-06-10 MED ORDER — GLYCOPYRROLATE 1 MG PO TABS: 1.0000 mg | ORAL_TABLET | Freq: Three times a day (TID) | ORAL | Status: DC | PRN

## 2015-06-10 MED ORDER — FAMOTIDINE IN NACL 20-0.9 MG/50ML-% IV SOLN
20.0000 mg | Freq: Once | INTRAVENOUS | Status: AC
  Administered 2015-06-10: 20 mg via INTRAVENOUS
  Filled 2015-06-10: qty 50

## 2015-06-10 MED ORDER — LACTATED RINGERS IV BOLUS (SEPSIS)
1000.0000 mL | Freq: Once | INTRAVENOUS | Status: AC
  Administered 2015-06-10: 1000 mL via INTRAVENOUS

## 2015-06-10 MED ORDER — LACTATED RINGERS IV BOLUS (SEPSIS)
250.0000 mL | Freq: Once | INTRAVENOUS | Status: AC
  Administered 2015-06-10: 250 mL via INTRAVENOUS

## 2015-06-10 MED ORDER — PROMETHAZINE HCL 12.5 MG PO TABS: 12.5000 mg | ORAL_TABLET | Freq: Four times a day (QID) | ORAL | Status: DC | PRN

## 2015-06-10 MED ORDER — PROMETHAZINE HCL 25 MG/ML IJ SOLN
25.0000 mg | Freq: Once | INTRAVENOUS | Status: AC
  Administered 2015-06-10: 25 mg via INTRAVENOUS
  Filled 2015-06-10: qty 1

## 2015-06-10 MED ORDER — SODIUM CHLORIDE 0.9 % IV SOLN
8.0000 mg | Freq: Once | INTRAVENOUS | Status: AC
  Administered 2015-06-10: 8 mg via INTRAVENOUS
  Filled 2015-06-10: qty 4

## 2015-06-10 NOTE — Discharge Instructions (Signed)
Hyperemesis Gravidarum  Hyperemesis gravidarum is a severe form of nausea and vomiting that happens during pregnancy. Hyperemesis is worse than morning sickness. It may cause you to have nausea or vomiting all day for many days. It may keep you from eating and drinking enough food and liquids. Hyperemesis usually occurs during the first half (the first 20 weeks) of pregnancy. It often goes away once a woman is in her second half of pregnancy. However, sometimes hyperemesis continues through an entire pregnancy.   CAUSES   The cause of this condition is not completely known but is thought to be related to changes in the body's hormones when pregnant. It could be from the high level of the pregnancy hormone or an increase in estrogen in the body.   SIGNS AND SYMPTOMS    Severe nausea and vomiting.   Nausea that does not go away.   Vomiting that does not allow you to keep any food down.   Weight loss and body fluid loss (dehydration).   Having no desire to eat or not liking food you have previously enjoyed.  DIAGNOSIS   Your health care provider will do a physical exam and ask you about your symptoms. He or she may also order blood tests and urine tests to make sure something else is not causing the problem.   TREATMENT   You may only need medicine to control the problem. If medicines do not control the nausea and vomiting, you will be treated in the hospital to prevent dehydration, increased acid in the blood (acidosis), weight loss, and changes in the electrolytes in your body that may harm the unborn baby (fetus). You may need IV fluids.   HOME CARE INSTRUCTIONS    Only take over-the-counter or prescription medicines as directed by your health care provider.   Try eating a couple of dry crackers or toast in the morning before getting out of bed.   Avoid foods and smells that upset your stomach.   Avoid fatty and spicy foods.   Eat 5-6 small meals a day.   Do not drink when eating meals. Drink between  meals.   For snacks, eat high-protein foods, such as cheese.   Eat or suck on things that have ginger in them. Ginger helps nausea.   Avoid food preparation. The smell of food can spoil your appetite.   Avoid iron pills and iron in your multivitamins until after 3-4 months of being pregnant. However, consult with your health care provider before stopping any prescribed iron pills.  SEEK MEDICAL CARE IF:    Your abdominal pain increases.   You have a severe headache.   You have vision problems.   You are losing weight.  SEEK IMMEDIATE MEDICAL CARE IF:    You are unable to keep fluids down.   You vomit blood.   You have constant nausea and vomiting.   You have excessive weakness.   You have extreme thirst.   You have dizziness or fainting.   You have a fever or persistent symptoms for more than 2-3 days.   You have a fever and your symptoms suddenly get worse.  MAKE SURE YOU:    Understand these instructions.   Will watch your condition.   Will get help right away if you are not doing well or get worse.     This information is not intended to replace advice given to you by your health care provider. Make sure you discuss any questions you have with 

## 2015-06-10 NOTE — MAU Note (Signed)
Pt reportts she has had lower abd cramping over the weekend. Has had n/v throughout her pregnancy . On several antiemtic none are helping at the moment. Cramping has moved to upper abd yesterday.

## 2015-06-10 NOTE — MAU Provider Note (Signed)
History     CSN: [id number]  Arrival date and time: 06/10/15 1357   First Provider Initiated Contact with Patient 06/10/15 1730      Chief Complaint  Patient presents with  . Abdominal Cramping   HPI   [name] is a 30 y.o. female [phone] @ [redacted]w[redacted]d here today with complaints of N/V; in the last 24 hours she has vomited more than 10 times, she is vomiting every 30 minutes. She is also having upper abdomen that radiates to her upper back, both sides. She is taking protonix for the GERD symptoms. This helps some.   Denies vaginal bleeding   OB History    Gravida Para Term Preterm AB TAB SAB Ectopic Multiple Living   3 2 1 1  0  0   1      Past Medical History  Diagnosis Date  . Abnormal Pap smear 2012    colpo result benign  . Pregnancy induced hypertension     Past Surgical History  Procedure Laterality Date  . Wisdom tooth extraction      History reviewed. No pertinent family history.  Social History  Substance Use Topics  . Smoking status: Never Smoker   . Smokeless tobacco: Never Used  . Alcohol Use: No    Allergies: No Known Allergies  Prescriptions prior to admission  Medication Sig Dispense Refill Last Dose  . Doxylamine-Pyridoxine 10-10 MG TBEC Take 2 tablets at bedtime, on an empty stomach.  Take 1 tablet prior to breakfast and 1 tablet prior to lunch. (Patient taking differently: Take 1-2 tablets by mouth 3 (three) times daily. Take 2 tablets at bedtime, on an empty stomach.  Take 1 tablet prior to breakfast and 1 tablet prior to lunch.) 60 tablet 6 06/10/2015 at Unknown time  . glycopyrrolate (ROBINUL) 1 MG tablet Take 1 tablet (1 mg total) by mouth 3 (three) times daily as needed. (Patient taking differently: Take 1 mg by mouth 3 (three) times daily as needed (spitting). ) 30 tablet 2 06/10/2015 at Unknown time  . metoCLOPramide (REGLAN) 10 MG tablet Take 1 tablet (10 mg total) by mouth every 6 (six) hours as needed for nausea. 30 tablet 4 06/10/2015  at Unknown time  . ondansetron (ZOFRAN ODT) 8 MG disintegrating tablet Take 1 tablet (8 mg total) by mouth every 8 (eight) hours as needed for nausea or vomiting. 20 tablet 0 Past Week at Unknown time  . pantoprazole (PROTONIX) 40 MG tablet Take 1 tablet (40 mg total) by mouth daily. 30 tablet 2 06/09/2015 at Unknown time  . Prenatal Vit-Fe Fumarate-FA (PRENATAL MULTIVITAMIN) TABS tablet Take 1 tablet by mouth daily at 12 noon.   Past Week at Unknown time  . promethazine (PHENERGAN) 12.5 MG tablet Take 1 tablet (12.5 mg total) by mouth every 6 (six) hours as needed for nausea or vomiting. 30 tablet 0 06/09/2015 at Unknown time   Results for orders placed or performed during the hospital encounter of 06/10/15 (from the past 48 hour(s))  Urinalysis, Routine w reflex microscopic (not at [facility])     Status: Abnormal   Collection Time: 06/10/15  2:10 PM  Result Value Ref Range   Color, Urine YELLOW YELLOW   APPearance HAZY (A) CLEAR   Specific Gravity, Urine >1.030 (H) 1.005 - 1.030   pH 6.5 5.0 - 8.0   Glucose, UA NEGATIVE NEGATIVE mg/dL   Hgb urine dipstick SMALL (A) NEGATIVE   Bilirubin Urine NEGATIVE NEGATIVE   Ketones, ur >80 (A) NEGATIVE  mg/dL   Protein, ur >300 (A) NEGATIVE mg/dL   Nitrite NEGATIVE NEGATIVE   Leukocytes, UA NEGATIVE NEGATIVE  Urine microscopic-add on     Status: Abnormal   Collection Time: 06/10/15  2:10 PM  Result Value Ref Range   Squamous Epithelial / LPF 0-5 (A) NONE SEEN   WBC, UA 0-5 0 - 5 WBC/hpf   RBC / HPF 0-5 0 - 5 RBC/hpf   Bacteria, UA FEW (A) NONE SEEN   Casts HYALINE CASTS (A) NEGATIVE   Urine-Other AMORPHOUS URATES/PHOSPHATES     Comment: MUCOUS PRESENT   Review of Systems  Gastrointestinal: Positive for heartburn, nausea, vomiting and abdominal pain.  Genitourinary: Negative for dysuria and urgency.   Physical Exam   Blood pressure 135/79, pulse 100, temperature 99 F (37.2 C), temperature source Oral, resp. rate 18, height 5\' 4"  (1.626 m), last  menstrual period 03/01/2015, SpO2 97 %.  Physical Exam  Constitutional: She is oriented to person, place, and time. She appears well-developed and well-nourished. No distress.  HENT:  Head: Normocephalic.  Eyes: Pupils are equal, round, and reactive to light.  Respiratory: Effort normal.  GI: Soft. Normal appearance. There is tenderness in the epigastric area.  Musculoskeletal: Normal range of motion.  Neurological: She is alert and oriented to person, place, and time.  Skin: Skin is warm. She is not diaphoretic.  Psychiatric: Her behavior is normal.    MAU Course  Procedures  None  MDM  Urine culture pending LR bolus X 1 D5LR with 25 mg of phenergan X 1 Pepcid 20 mg IV  Zofran 8 mg   Patient tolerated PO fluids Dr. Charlesetta Garibaldi notified; ok to dc home.   Assessment and Plan   A:  1. Ptyalism   2. Hyperemesis complicating pregnancy, antepartum   3. Gastroesophageal reflux disease, esophagitis presence not specified     P:  Discharge home in stable condition Continue medication regimen for hyperemesis Follow up with OB as scheduled Return to MAU if symptoms worsen Liquids only per Dr. Charlesetta Garibaldi.     Lezlie Lye, NP 06/10/2015 8:09 PM

## 2015-06-11 LAB — URINE CULTURE

## 2015-06-12 ENCOUNTER — Encounter (HOSPITAL_COMMUNITY): Payer: Self-pay

## 2015-06-12 ENCOUNTER — Inpatient Hospital Stay (HOSPITAL_COMMUNITY)
Admission: AD | Admit: 2015-06-12 | Discharge: 2015-06-18 | DRG: 781 | Disposition: A | Discharge status: Home / Self Care | Payer: BC Managed Care – PPO | Source: Ambulatory Visit | Attending: Obstetrics and Gynecology | Admitting: Obstetrics and Gynecology

## 2015-06-12 DIAGNOSIS — Z79899 Other long term (current) drug therapy: Secondary | ICD-10-CM

## 2015-06-12 DIAGNOSIS — O99282 Endocrine, nutritional and metabolic diseases complicating pregnancy, second trimester: Secondary | ICD-10-CM | POA: Diagnosis present

## 2015-06-12 DIAGNOSIS — O10912 Unspecified pre-existing hypertension complicating pregnancy, second trimester: Secondary | ICD-10-CM | POA: Diagnosis present

## 2015-06-12 DIAGNOSIS — Z6841 Body Mass Index (BMI) 40.0 and over, adult: Secondary | ICD-10-CM

## 2015-06-12 DIAGNOSIS — O99212 Obesity complicating pregnancy, second trimester: Secondary | ICD-10-CM | POA: Diagnosis present

## 2015-06-12 DIAGNOSIS — K219 Gastro-esophageal reflux disease without esophagitis: Secondary | ICD-10-CM | POA: Diagnosis present

## 2015-06-12 DIAGNOSIS — O210 Mild hyperemesis gravidarum: Principal | ICD-10-CM | POA: Diagnosis present

## 2015-06-12 DIAGNOSIS — R1110 Vomiting, unspecified: Secondary | ICD-10-CM | POA: Diagnosis present

## 2015-06-12 DIAGNOSIS — E876 Hypokalemia: Secondary | ICD-10-CM | POA: Diagnosis present

## 2015-06-12 DIAGNOSIS — Z3A15 15 weeks gestation of pregnancy: Secondary | ICD-10-CM

## 2015-06-12 DIAGNOSIS — O99012 Anemia complicating pregnancy, second trimester: Secondary | ICD-10-CM | POA: Diagnosis present

## 2015-06-12 DIAGNOSIS — R824 Acetonuria: Secondary | ICD-10-CM | POA: Diagnosis present

## 2015-06-12 LAB — BASIC METABOLIC PANEL
Anion gap: 13 (ref 5–15)
BUN: 7 mg/dL (ref 6–20)
CALCIUM: 9.5 mg/dL (ref 8.9–10.3)
CO2: 24 mmol/L (ref 22–32)
Chloride: 97 mmol/L — ABNORMAL LOW (ref 101–111)
Creatinine, Ser: 0.57 mg/dL (ref 0.44–1.00)
GFR calc Af Amer: >60 mL/min (ref >60)
Glucose, Bld: 91 mg/dL (ref 65–99)
POTASSIUM: 3.2 mmol/L — AB (ref 3.5–5.1)
SODIUM: 134 mmol/L — AB (ref 135–145)

## 2015-06-12 LAB — CBC
HEMATOCRIT: 32.5 % — AB (ref 36.0–46.0)
Hemoglobin: 10.9 g/dL — ABNORMAL LOW (ref 12.0–15.0)
MCH: 24.2 pg — AB (ref 26.0–34.0)
MCHC: 33.5 g/dL (ref 30.0–36.0)
MCV: 72.1 fL — ABNORMAL LOW (ref 78.0–100.0)
Platelets: 359 10*3/uL (ref 150–400)
RBC: 4.51 MIL/uL (ref 3.87–5.11)
RDW: 17.7 % — AB (ref 11.5–15.5)
WBC: 14 10*3/uL — AB (ref 4.0–10.5)

## 2015-06-12 LAB — URINE MICROSCOPIC-ADD ON

## 2015-06-12 LAB — URINALYSIS, ROUTINE W REFLEX MICROSCOPIC
GLUCOSE, UA: NEGATIVE mg/dL
LEUKOCYTES UA: NEGATIVE
Nitrite: NEGATIVE
PH: 6 (ref 5.0–8.0)
Protein, ur: >300 mg/dL — AB

## 2015-06-12 MED ORDER — PRENATAL MULTIVITAMIN CH
1.0000 | ORAL_TABLET | Freq: Every day | ORAL | Status: DC
  Administered 2015-06-14: 1 via ORAL
  Filled 2015-06-12 (×3): qty 1

## 2015-06-12 MED ORDER — PROMETHAZINE HCL 25 MG/ML IJ SOLN
25.0000 mg | Freq: Once | INTRAMUSCULAR | Status: AC
  Administered 2015-06-12: 25 mg via INTRAVENOUS
  Filled 2015-06-12 (×2): qty 1

## 2015-06-12 MED ORDER — SODIUM CHLORIDE 0.9 % IV SOLN
8.0000 mg | Freq: Three times a day (TID) | INTRAVENOUS | Status: DC
  Administered 2015-06-12 – 2015-06-18 (×18): 8 mg via INTRAVENOUS
  Filled 2015-06-12 (×20): qty 4

## 2015-06-12 MED ORDER — DEXTROSE-NACL 5-0.45 % IV SOLN
INTRAVENOUS | Status: AC
  Administered 2015-06-12: 14:00:00 via INTRAVENOUS

## 2015-06-12 MED ORDER — ZOLPIDEM TARTRATE 5 MG PO TABS
5.0000 mg | ORAL_TABLET | Freq: Every evening | ORAL | Status: DC | PRN
  Administered 2015-06-13 – 2015-06-14 (×2): 5 mg via ORAL
  Filled 2015-06-12 (×2): qty 1

## 2015-06-12 MED ORDER — DEXTROSE-NACL 5-0.45 % IV SOLN
INTRAVENOUS | Status: DC
  Administered 2015-06-12 – 2015-06-15 (×6): via INTRAVENOUS

## 2015-06-12 MED ORDER — ACETAMINOPHEN 325 MG PO TABS: 650.0000 mg | ORAL_TABLET | ORAL | Status: DC | PRN

## 2015-06-12 MED ORDER — CALCIUM CARBONATE ANTACID 500 MG PO CHEW
2.0000 | CHEWABLE_TABLET | ORAL | Status: DC | PRN
  Filled 2015-06-12: qty 2

## 2015-06-12 MED ORDER — DOCUSATE SODIUM 100 MG PO CAPS
100.0000 mg | ORAL_CAPSULE | Freq: Every day | ORAL | Status: DC
  Administered 2015-06-13 – 2015-06-17 (×5): 100 mg via ORAL
  Filled 2015-06-12 (×7): qty 1

## 2015-06-12 NOTE — MAU Note (Signed)
Pt c/o non-stop vomiting. Pt was told if she wasn't able to keep anything down by today to come back to hospital. Pt c/o feeling dizzy frequently. Pt c/o stomach cramping. Pt denies diarrhea.

## 2015-06-12 NOTE — H&P (Signed)
Admission History and Physical Exam for an Obstetrics Patient  Evelyn Marshall is a 30 y.o. female, G3P1101, at [redacted]w[redacted]d gestation, who presents for management of hyperemesis. She has been followed at the [facility] and Gynecology division of [facility].  Her pregnancy has been complicated by hyperemesis. She has been seen at the hospital several times and has been admitted for IV fluid management. She is unable to keep food down and she is beginning to feel weak once again. See history below.  OB History    Gravida Para Term Preterm AB TAB SAB Ectopic Multiple Living   3 2 1 1  0  0   1      Past Medical History  Diagnosis Date  . Abnormal Pap smear 2012    colpo result benign  . Pregnancy induced hypertension     Prescriptions prior to admission  Medication Sig Dispense Refill Last Dose  . Doxylamine-Pyridoxine 10-10 MG TBEC Take 2 tablets at bedtime, on an empty stomach.  Take 1 tablet prior to breakfast and 1 tablet prior to lunch. (Patient taking differently: Take 1-2 tablets by mouth 3 (three) times daily. Take 2 tablets at bedtime, on an empty stomach.  Take 1 tablet prior to breakfast and 1 tablet prior to lunch.) 60 tablet 6 06/10/2015 at Unknown time  . glycopyrrolate (ROBINUL) 1 MG tablet Take 1 tablet (1 mg total) by mouth 3 (three) times daily as needed (spitting). 30 tablet 2   . metoCLOPramide (REGLAN) 10 MG tablet Take 1 tablet (10 mg total) by mouth every 6 (six) hours as needed for nausea. 30 tablet 4 06/10/2015 at Unknown time  . ondansetron (ZOFRAN ODT) 8 MG disintegrating tablet Take 1 tablet (8 mg total) by mouth every 8 (eight) hours as needed for nausea or vomiting. 20 tablet 0 Past Week at Unknown time  . pantoprazole (PROTONIX) 40 MG tablet Take 1 tablet (40 mg total) by mouth daily. 30 tablet 2 06/09/2015 at Unknown time  . Prenatal Vit-Fe Fumarate-FA (PRENATAL MULTIVITAMIN) TABS tablet Take 1 tablet by mouth daily at 12 noon.    Past Week at Unknown time  . promethazine (PHENERGAN) 12.5 MG tablet Take 1 tablet (12.5 mg total) by mouth every 6 (six) hours as needed for nausea or vomiting. 30 tablet 3     Past Surgical History  Procedure Laterality Date  . Wisdom tooth extraction      No Known Allergies  Family History: family history includes Heart disease in her father; Hypertension in her father.  Social History:  reports that she has never smoked. She has never used smokeless tobacco. She reports that she does not drink alcohol or use illicit drugs.  Review of systems: Normal pregnancy complaints.  Admission Physical Exam:    Body mass index is 41 kg/(m^2).  Blood pressure 150/94, pulse 77, temperature 99.3 F (37.4 C), temperature source Oral, resp. rate 18, height 5\' 4"  (1.626 m), weight 239 lb (108.41 kg), last menstrual period 03/01/2015, SpO2 98 %.  HEENT:                 Within normal limits; appears fatigued Chest:                   Clear Heart:                    Regular rate and rhythm Abdomen:             Gravid  and nontender Extremities:          Grossly normal Neurologic exam: Grossly normal   Prenatal labs: ABO, Rh:             --/--/O NEG (04/26 0000) Other Comments:    Results for orders placed or performed during the hospital encounter of 06/12/15 (from the past 24 hour(s))  Urinalysis, Routine w reflex microscopic (not at [facility])     Status: Abnormal   Collection Time: 06/12/15 12:00 PM  Result Value Ref Range   Color, Urine AMBER (A) YELLOW   APPearance CLEAR CLEAR   Specific Gravity, Urine >1.030 (H) 1.005 - 1.030   pH 6.0 5.0 - 8.0   Glucose, UA NEGATIVE NEGATIVE mg/dL   Hgb urine dipstick TRACE (A) NEGATIVE   Bilirubin Urine MODERATE (A) NEGATIVE   Ketones, ur >80 (A) NEGATIVE mg/dL   Protein, ur >300 (A) NEGATIVE mg/dL   Nitrite NEGATIVE NEGATIVE   Leukocytes, UA NEGATIVE NEGATIVE  Urine microscopic-add on     Status: Abnormal   Collection Time: 06/12/15 12:00 PM   Result Value Ref Range   Squamous Epithelial / LPF 0-5 (A) NONE SEEN   WBC, UA 0-5 0 - 5 WBC/hpf   RBC / HPF 0-5 0 - 5 RBC/hpf   Bacteria, UA RARE (A) NONE SEEN   Crystals CA OXALATE CRYSTALS (A) NEGATIVE   Urine-Other MUCOUS PRESENT      Assessment:  [redacted]w[redacted]d gestation  Hyperemesis  Ketonuria  Plan:  We will place the patient in observation. We will give her IV fluid management and antirheumatic management through IVs. Hopefully, she'll be ready to go home tomorrow.   Mc Bloodworth V 06/12/2015, 1:23 PM

## 2015-06-12 NOTE — MAU Note (Signed)
Urine sent to lab 

## 2015-06-13 DIAGNOSIS — O210 Mild hyperemesis gravidarum: Secondary | ICD-10-CM | POA: Diagnosis present

## 2015-06-13 DIAGNOSIS — K219 Gastro-esophageal reflux disease without esophagitis: Secondary | ICD-10-CM | POA: Diagnosis present

## 2015-06-13 DIAGNOSIS — O10912 Unspecified pre-existing hypertension complicating pregnancy, second trimester: Secondary | ICD-10-CM | POA: Diagnosis present

## 2015-06-13 DIAGNOSIS — Z6841 Body Mass Index (BMI) 40.0 and over, adult: Secondary | ICD-10-CM | POA: Diagnosis not present

## 2015-06-13 DIAGNOSIS — O99282 Endocrine, nutritional and metabolic diseases complicating pregnancy, second trimester: Secondary | ICD-10-CM | POA: Diagnosis present

## 2015-06-13 DIAGNOSIS — O99212 Obesity complicating pregnancy, second trimester: Secondary | ICD-10-CM | POA: Diagnosis present

## 2015-06-13 DIAGNOSIS — Z3A15 15 weeks gestation of pregnancy: Secondary | ICD-10-CM | POA: Diagnosis not present

## 2015-06-13 DIAGNOSIS — E876 Hypokalemia: Secondary | ICD-10-CM | POA: Diagnosis present

## 2015-06-13 DIAGNOSIS — R824 Acetonuria: Secondary | ICD-10-CM | POA: Diagnosis present

## 2015-06-13 DIAGNOSIS — Z79899 Other long term (current) drug therapy: Secondary | ICD-10-CM | POA: Diagnosis not present

## 2015-06-13 DIAGNOSIS — O99012 Anemia complicating pregnancy, second trimester: Secondary | ICD-10-CM | POA: Diagnosis present

## 2015-06-13 LAB — COMPREHENSIVE METABOLIC PANEL
ALT: 50 U/L (ref 14–54)
AST: 31 U/L (ref 15–41)
Albumin: 3.1 g/dL — ABNORMAL LOW (ref 3.5–5.0)
Alkaline Phosphatase: 91 U/L (ref 38–126)
Anion gap: 10 (ref 5–15)
CHLORIDE: 98 mmol/L — AB (ref 101–111)
CO2: 24 mmol/L (ref 22–32)
CREATININE: 0.46 mg/dL (ref 0.44–1.00)
Calcium: 8.8 mg/dL — ABNORMAL LOW (ref 8.9–10.3)
GFR calc Af Amer: >60 mL/min (ref >60)
Glucose, Bld: 110 mg/dL — ABNORMAL HIGH (ref 65–99)
Potassium: 2.7 mmol/L — CL (ref 3.5–5.1)
SODIUM: 132 mmol/L — AB (ref 135–145)
Total Bilirubin: 0.6 mg/dL (ref 0.3–1.2)
Total Protein: 7.2 g/dL (ref 6.5–8.1)

## 2015-06-13 LAB — CBC
HCT: 30.3 % — ABNORMAL LOW (ref 36.0–46.0)
Hemoglobin: 10.1 g/dL — ABNORMAL LOW (ref 12.0–15.0)
MCH: 23.8 pg — ABNORMAL LOW (ref 26.0–34.0)
MCHC: 33.3 g/dL (ref 30.0–36.0)
MCV: 71.3 fL — AB (ref 78.0–100.0)
PLATELETS: 339 10*3/uL (ref 150–400)
RBC: 4.25 MIL/uL (ref 3.87–5.11)
RDW: 17.4 % — ABNORMAL HIGH (ref 11.5–15.5)
WBC: 11.4 10*3/uL — AB (ref 4.0–10.5)

## 2015-06-13 LAB — URIC ACID: URIC ACID, SERUM: 4.5 mg/dL (ref 2.3–6.6)

## 2015-06-13 LAB — PROTEIN / CREATININE RATIO, URINE
Creatinine, Urine: 203 mg/dL
Protein Creatinine Ratio: 0.68 mg/mg{Cre} — ABNORMAL HIGH (ref 0.00–0.15)
Total Protein, Urine: 138 mg/dL

## 2015-06-13 LAB — LACTATE DEHYDROGENASE: LDH: 127 U/L (ref 98–192)

## 2015-06-13 MED ORDER — METHYLPREDNISOLONE 4 MG PO TABS
8.0000 mg | ORAL_TABLET | Freq: Every day | ORAL | Status: DC
  Administered 2015-06-17: 8 mg via ORAL
  Filled 2015-06-13: qty 2

## 2015-06-13 MED ORDER — METHYLPREDNISOLONE 4 MG PO TABS: 4.0000 mg | ORAL_TABLET | Freq: Every day | ORAL | Status: DC

## 2015-06-13 MED ORDER — HYDRALAZINE HCL 20 MG/ML IJ SOLN: 10.0000 mg | Freq: Once | INTRAMUSCULAR | Status: DC | PRN

## 2015-06-13 MED ORDER — METHYLPREDNISOLONE 16 MG PO TABS
16.0000 mg | ORAL_TABLET | Freq: Every day | ORAL | Status: AC
Start: 2015-06-14 — End: 2015-06-16
  Administered 2015-06-14 – 2015-06-16 (×3): 16 mg via ORAL
  Filled 2015-06-13 (×3): qty 1

## 2015-06-13 MED ORDER — KCL IN DEXTROSE-NACL 40-5-0.45 MEQ/L-%-% IV SOLN
INTRAVENOUS | Status: AC
  Administered 2015-06-13 – 2015-06-14 (×3): via INTRAVENOUS
  Filled 2015-06-13 (×4): qty 1000

## 2015-06-13 MED ORDER — METHYLPREDNISOLONE 4 MG PO TABS
8.0000 mg | ORAL_TABLET | Freq: Every day | ORAL | Status: DC
  Administered 2015-06-18: 8 mg via ORAL
  Filled 2015-06-13: qty 2

## 2015-06-13 MED ORDER — METHYLPREDNISOLONE 16 MG PO TABS
16.0000 mg | ORAL_TABLET | Freq: Every day | ORAL | Status: AC
  Administered 2015-06-15 – 2015-06-17 (×3): 16 mg via ORAL
  Filled 2015-06-13 (×4): qty 1

## 2015-06-13 MED ORDER — METHYLPREDNISOLONE 16 MG PO TABS
16.0000 mg | ORAL_TABLET | Freq: Every day | ORAL | Status: AC
  Administered 2015-06-14: 16 mg via ORAL
  Filled 2015-06-13 (×2): qty 1

## 2015-06-13 MED ORDER — POTASSIUM CHLORIDE 10 MEQ/100ML IV SOLN: 10.0000 meq | INTRAVENOUS | Status: DC

## 2015-06-13 MED ORDER — PANTOPRAZOLE SODIUM 40 MG IV SOLR
40.0000 mg | Freq: Every day | INTRAVENOUS | Status: DC
  Administered 2015-06-13 – 2015-06-16 (×4): 40 mg via INTRAVENOUS
  Filled 2015-06-13 (×5): qty 40

## 2015-06-13 MED ORDER — LABETALOL HCL 5 MG/ML IV SOLN: 20.0000 mg | INTRAVENOUS | Status: DC | PRN

## 2015-06-13 MED ORDER — METHYLPREDNISOLONE SODIUM SUCC 125 MG IJ SOLR
48.0000 mg | Freq: Once | INTRAMUSCULAR | Status: AC
  Administered 2015-06-13: 48 mg via INTRAVENOUS
  Filled 2015-06-13: qty 0.77

## 2015-06-13 MED ORDER — METHYLPREDNISOLONE 4 MG PO TABS
8.0000 mg | ORAL_TABLET | Freq: Every day | ORAL | Status: DC
  Administered 2015-06-16 – 2015-06-17 (×2): 8 mg via ORAL
  Filled 2015-06-13 (×3): qty 2

## 2015-06-13 NOTE — Progress Notes (Signed)
Lavetta Nielsen, CNM notified of Results for ROSS, SZABO (MRN [id number]) as of 06/13/2015 14:27  Ref. Range 06/13/2015 13:19  Potassium Latest Ref Range: 3.5-5.1 mmol/L 2.7 (LL)   No further orders received at this time.

## 2015-06-13 NOTE — Progress Notes (Signed)
30 y.o. year old female,at [redacted]w[redacted]d gestation.  SUBJECTIVE:  Still very nauseated. Unable to keep food down. Does not feel any better.  OBJECTIVE:  BP 148/94 mmHg  Pulse 73  Temp(Src) 99.2 F (37.3 C) (Oral)  Resp 18  Ht 5\' 4"  (1.626 m)  Wt 241 lb 6 oz (109.487 kg)  BMI 41.41 kg/m2  SpO2 100%  LMP 03/01/2015 (Exact Date)  Fetal Heart Tones:  Stable  Contractions:          None  The patient still very weak and nauseated. Exam otherwise stable  ASSESSMENT:  [redacted]w[redacted]d Weeks Pregnancy  Hyperemesis; she has not responded to fluids and medication.  GERD  Low potassium  PLAN:  We will begin a steroid taper.  At potassium to IV fluids.  Protonix.  Gildardo Cranker, M.D. 06/13/2015 11:11 AM

## 2015-06-13 NOTE — Progress Notes (Signed)
Pharmacy Consult:   MEDROL (METHYLPREDNISOLONE) TAPER  FOR HYPEREMESIS GRAVIDARUM PATIENTS  The following is a 14 day taper of methylprednisolone for hyperemesis. Doses on day 1  will be given IV.  All doses starting on day 2  will be given PO. (If patient cannot tolerate oral medications, contact the pharmacy to change route to IV.)   Date Day Morning Midday Bedtime  06/13/15 1 16  mg 16 mg 16 mg  06/14/15 2 16  mg 16 mg 16 mg  06/15/15 3 16  mg 16 mg 16 mg  06/16/15 4 16  mg 8 mg 16 mg  06/17/15 5 16  mg 8 mg 8 mg  06/18/15 6 8  mg 8 mg 8 mg  06/19/15 7 8  mg 4 mg 8 mg  06/20/15 8 8  mg 4 mg 4 mg  06/21/15 9 8  mg 4 mg   06/22/15 10 8  mg 4 mg   06/23/15 11 8  mg    06/24/15 12 8  mg    06/25/15 13 4  mg    06/26/15 14 4  mg     Check fasting blood sugars daily while on the taper. Notify MD if fasting blood sugar>95.  Evelyn Marshall 06/13/2015

## 2015-06-13 NOTE — Progress Notes (Signed)
Evelyn Marshall, CNM notified of BP orders received   06/13/15 1314  Vital Signs  BP (!) 153/100 mmHg

## 2015-06-14 LAB — POTASSIUM: POTASSIUM: 3.1 mmol/L — AB (ref 3.5–5.1)

## 2015-06-14 LAB — GLUCOSE, CAPILLARY: GLUCOSE-CAPILLARY: 110 mg/dL — AB (ref 65–99)

## 2015-06-14 MED ORDER — METHYLPREDNISOLONE SODIUM SUCC 40 MG IJ SOLR
16.0000 mg | Freq: Once | INTRAMUSCULAR | Status: AC
  Administered 2015-06-14: 16 mg via INTRAVENOUS
  Filled 2015-06-14: qty 0.4

## 2015-06-14 MED ORDER — GLYCOPYRROLATE 1 MG PO TABS
1.0000 mg | ORAL_TABLET | Freq: Three times a day (TID) | ORAL | Status: DC
  Administered 2015-06-14 – 2015-06-17 (×11): 1 mg via ORAL
  Filled 2015-06-14 (×12): qty 1

## 2015-06-14 MED ORDER — LABETALOL HCL 100 MG PO TABS
100.0000 mg | ORAL_TABLET | Freq: Two times a day (BID) | ORAL | Status: DC
  Administered 2015-06-14 – 2015-06-17 (×8): 100 mg via ORAL
  Filled 2015-06-14 (×8): qty 1

## 2015-06-14 MED ORDER — COMPLETENATE 29-1 MG PO CHEW
1.0000 | CHEWABLE_TABLET | Freq: Every day | ORAL | Status: DC
  Administered 2015-06-14 – 2015-06-17 (×3): 1 via ORAL
  Filled 2015-06-14 (×5): qty 1

## 2015-06-14 MED ORDER — POTASSIUM CHLORIDE CRYS ER 20 MEQ PO TBCR
40.0000 meq | EXTENDED_RELEASE_TABLET | Freq: Every day | ORAL | Status: DC
  Administered 2015-06-14 – 2015-06-17 (×4): 40 meq via ORAL
  Filled 2015-06-14 (×5): qty 2

## 2015-06-14 NOTE — Progress Notes (Signed)
Initial Nutrition Assessment  DOCUMENTATION CODES:  Not applicable  INTERVENTION:  Diet for Hyperemesis, bland food choices, small frequent meals  NUTRITION DIAGNOSIS:  Inadequate oral intake related to nausea, vomiting as evidenced by percent weight loss.  GOAL:  Patient will meet greater than or equal to 90% of their needs  MONITOR:  Weight trends  REASON FOR ASSESSMENT:   (Hyperemesis)   ASSESSMENT:   Pt unable to keep down any food consumed today. She is trying soft bland options. Has good understanding of diet for hyperemesis. Current weight is 5.1% below weight on 04/16/15 Hx of hyperemesis with prev preg.  Diet Order:   Regular as tol  Skin:  Reviewed, no issues  Height:   Ht Readings from Last 1 Encounters:  06/12/15 5\' 4"  (1.626 m)   Weight:   Wt Readings from Last 1 Encounters:  06/14/15 241 lb (109.317 kg)   Ideal Body Weight:  54.5 kg  BMI:  Body mass index is 41.35 kg/(m^2).  Estimated Nutritional Needs:   Kcal:  2100-2300  Protein:  85-95 g  Fluid:  2.4 L  EDUCATION NEEDS:   No education needs identified at this time. Diet education was completed during previous adm. Pt has good knowledge of diet due to hx of hyperemesis with prev preg  Weyman Rodney M.Fredderick Severance LDN Neonatal Nutrition Support Specialist/RD III Pager [phone]      Phone [phone]

## 2015-06-14 NOTE — Progress Notes (Addendum)
Patient ID: Evelyn Marshall, female   DOB: 05-Jul-1985, 30 y.o.   MRN: [id number] Pt without complaints.  No leakage of fluid or VB.  Good FM  BP 148/101 mmHg  Pulse 80  Temp(Src) 98.8 F (37.1 C) (Oral)  Resp 18  Ht _0  (1.626 m)  Wt 241 lb (109.317 kg)  BMI 41.35 kg/m2  SpO2 99%  LMP 03/01/2015 (Exact Date)  FHTS 150  Toco none  Pt in NAD CV RRR Lungs CTAB abd  Gravid soft and NT GU no vb EXt no calf tenderness Results for orders placed or performed during the hospital encounter of 06/12/15 (from the past 72 hour(s))  Urinalysis, Routine w reflex microscopic (not at [facility])     Status: Abnormal   Collection Time: 06/12/15 12:00 PM  Result Value Ref Range   Color, Urine AMBER (A) YELLOW    Comment: BIOCHEMICALS MAY BE AFFECTED BY COLOR   APPearance CLEAR CLEAR   Specific Gravity, Urine >1.030 (H) 1.005 - 1.030   pH 6.0 5.0 - 8.0   Glucose, UA NEGATIVE NEGATIVE mg/dL   Hgb urine dipstick TRACE (A) NEGATIVE   Bilirubin Urine MODERATE (A) NEGATIVE   Ketones, ur >80 (A) NEGATIVE mg/dL   Protein, ur >300 (A) NEGATIVE mg/dL   Nitrite NEGATIVE NEGATIVE   Leukocytes, UA NEGATIVE NEGATIVE  Urine microscopic-add on     Status: Abnormal   Collection Time: 06/12/15 12:00 PM  Result Value Ref Range   Squamous Epithelial / LPF 0-5 (A) NONE SEEN   WBC, UA 0-5 0 - 5 WBC/hpf   RBC / HPF 0-5 0 - 5 RBC/hpf   Bacteria, UA RARE (A) NONE SEEN   Crystals CA OXALATE CRYSTALS (A) NEGATIVE   Urine-Other MUCOUS PRESENT   Basic metabolic panel     Status: Abnormal   Collection Time: 06/12/15  1:40 PM  Result Value Ref Range   Sodium 134 (L) 135 - 145 mmol/L   Potassium 3.2 (L) 3.5 - 5.1 mmol/L   Chloride 97 (L) 101 - 111 mmol/L   CO2 24 22 - 32 mmol/L   Glucose, Bld 91 65 - 99 mg/dL   BUN 7 6 - 20 mg/dL   Creatinine, Ser 0.57 0.44 - 1.00 mg/dL   Calcium 9.5 8.9 - 10.3 mg/dL   GFR calc non Af Amer >60 >60 mL/min   GFR calc Af Amer >60 >60 mL/min    Comment: (NOTE) The eGFR has been  calculated using the CKD EPI equation. This calculation has not been validated in all clinical situations. eGFR's persistently <60 mL/min signify possible Chronic Kidney Disease.    Anion gap 13 5 - 15  CBC     Status: Abnormal   Collection Time: 06/12/15  1:40 PM  Result Value Ref Range   WBC 14.0 (H) 4.0 - 10.5 K/uL   RBC 4.51 3.87 - 5.11 MIL/uL   Hemoglobin 10.9 (L) 12.0 - 15.0 g/dL   HCT 32.5 (L) 36.0 - 46.0 %   MCV 72.1 (L) 78.0 - 100.0 fL   MCH 24.2 (L) 26.0 - 34.0 pg   MCHC 33.5 30.0 - 36.0 g/dL   RDW 17.7 (H) 11.5 - 15.5 %   Platelets 359 150 - 400 K/uL  CBC     Status: Abnormal   Collection Time: 06/13/15  1:19 PM  Result Value Ref Range   WBC 11.4 (H) 4.0 - 10.5 K/uL   RBC 4.25 3.87 - 5.11 MIL/uL   Hemoglobin 10.1 (L) 12.0 -  15.0 g/dL   HCT 30.3 (L) 36.0 - 46.0 %   MCV 71.3 (L) 78.0 - 100.0 fL   MCH 23.8 (L) 26.0 - 34.0 pg   MCHC 33.3 30.0 - 36.0 g/dL   RDW 17.4 (H) 11.5 - 15.5 %   Platelets 339 150 - 400 K/uL  Comprehensive metabolic panel     Status: Abnormal   Collection Time: 06/13/15  1:19 PM  Result Value Ref Range   Sodium 132 (L) 135 - 145 mmol/L   Potassium 2.7 (LL) 3.5 - 5.1 mmol/L    Comment: CRITICAL RESULT CALLED TO, READ BACK BY AND VERIFIED WITH: Talbert Forest 1410 06/13/15 BY A POTEAT    Chloride 98 (L) 101 - 111 mmol/L   CO2 24 22 - 32 mmol/L   Glucose, Bld 110 (H) 65 - 99 mg/dL   BUN <5 (L) 6 - 20 mg/dL    Comment: REPEATED TO VERIFY   Creatinine, Ser 0.46 0.44 - 1.00 mg/dL   Calcium 8.8 (L) 8.9 - 10.3 mg/dL   Total Protein 7.2 6.5 - 8.1 g/dL   Albumin 3.1 (L) 3.5 - 5.0 g/dL   AST 31 15 - 41 U/L   ALT 50 14 - 54 U/L   Alkaline Phosphatase 91 38 - 126 U/L   Total Bilirubin 0.6 0.3 - 1.2 mg/dL   GFR calc non Af Amer >60 >60 mL/min   GFR calc Af Amer >60 >60 mL/min    Comment: (NOTE) The eGFR has been calculated using the CKD EPI equation. This calculation has not been validated in all clinical situations. eGFR's persistently <60  mL/min signify possible Chronic Kidney Disease.    Anion gap 10 5 - 15  Lactate dehydrogenase     Status: None   Collection Time: 06/13/15  1:19 PM  Result Value Ref Range   LDH 127 98 - 192 U/L  Uric acid     Status: None   Collection Time: 06/13/15  1:19 PM  Result Value Ref Range   Uric Acid, Serum 4.5 2.3 - 6.6 mg/dL  Protein / creatinine ratio, urine     Status: Abnormal   Collection Time: 06/13/15  2:15 PM  Result Value Ref Range   Creatinine, Urine 203.00 mg/dL   Total Protein, Urine 138 mg/dL    Comment: NO NORMAL RANGE ESTABLISHED FOR THIS TEST   Protein Creatinine Ratio 0.68 (H) 0.00 - 0.15 mg/mg[Cre]  Glucose, capillary     Status: Abnormal   Collection Time: 06/14/15  5:25 AM  Result Value Ref Range   Glucose-Capillary 110 (H) 65 - 99 mg/dL  Potassium     Status: Abnormal   Collection Time: 06/14/15 12:58 PM  Result Value Ref Range   Potassium 3.1 (L) 3.5 - 5.1 mmol/L    Assessment and Plan [redacted]w[redacted]d Hyperemesis on steroid taper Pt still with n/v  Continue to check and replace potassium Anticipate DC once she can tolerate BRAT diet Pt with elevated blood pressures consistent with CNTN .  Will start labetalol

## 2015-06-15 LAB — BASIC METABOLIC PANEL
ANION GAP: 10 (ref 5–15)
BUN: <5 mg/dL — ABNORMAL LOW (ref 6–20)
CO2: 24 mmol/L (ref 22–32)
Calcium: 8.9 mg/dL (ref 8.9–10.3)
Chloride: 98 mmol/L — ABNORMAL LOW (ref 101–111)
Creatinine, Ser: 0.59 mg/dL (ref 0.44–1.00)
Glucose, Bld: 104 mg/dL — ABNORMAL HIGH (ref 65–99)
POTASSIUM: 2.6 mmol/L — AB (ref 3.5–5.1)
SODIUM: 132 mmol/L — AB (ref 135–145)

## 2015-06-15 LAB — GLUCOSE, CAPILLARY: Glucose-Capillary: 92 mg/dL (ref 65–99)

## 2015-06-15 LAB — MAGNESIUM: MAGNESIUM: 1.5 mg/dL — AB (ref 1.7–2.4)

## 2015-06-15 MED ORDER — POTASSIUM CHLORIDE 2 MEQ/ML IV SOLN
INTRAVENOUS | Status: DC
  Administered 2015-06-15 – 2015-06-17 (×6): via INTRAVENOUS
  Filled 2015-06-15 (×10): qty 1000

## 2015-06-15 MED ORDER — KCL IN DEXTROSE-NACL 40-5-0.45 MEQ/L-%-% IV SOLN
INTRAVENOUS | Status: DC
  Filled 2015-06-15 (×2): qty 1000

## 2015-06-15 MED ORDER — MAGNESIUM OXIDE 400 (241.3 MG) MG PO TABS
400.0000 mg | ORAL_TABLET | Freq: Two times a day (BID) | ORAL | Status: AC
  Administered 2015-06-15 (×2): 400 mg via ORAL
  Filled 2015-06-15 (×2): qty 1

## 2015-06-15 MED ORDER — METOCLOPRAMIDE HCL 5 MG/ML IJ SOLN
10.0000 mg | Freq: Four times a day (QID) | INTRAMUSCULAR | Status: DC
  Administered 2015-06-15 – 2015-06-18 (×12): 10 mg via INTRAVENOUS
  Filled 2015-06-15 (×12): qty 2

## 2015-06-15 MED ORDER — METOCLOPRAMIDE HCL 5 MG/5ML PO SOLN
10.0000 mg | Freq: Four times a day (QID) | ORAL | Status: DC | PRN
  Filled 2015-06-15: qty 10

## 2015-06-15 NOTE — Progress Notes (Signed)
Subjective: Patient reports some nausea and vomiting earlier in the morning.     Objective: I have reviewed patient's vital signs.  General: cooperative and no distress Resp: clear to auscultation bilaterally Cardio: regular rate and rhythm, S1, S2 normal, no murmur, click, rub or gallop GI: soft, non-tender; bowel sounds normal; no masses,  no organomegaly Extremities: extremities normal, atraumatic, no cyanosis or edema     . docusate sodium  100 mg Oral Daily  . glycopyrrolate  1 mg Oral TID  . labetalol  100 mg Oral BID  . magnesium oxide  400 mg Oral BID  . methylPREDNISolone  16 mg Oral Q breakfast   Followed by  . [START ON 06/18/2015] methylPREDNISolone  8 mg Oral Q breakfast   Followed by  . [START ON 06/25/2015] methylPREDNISolone  4 mg Oral Q breakfast  . methylPREDNISolone  16 mg Oral Q1400   Followed by  . [START ON 06/16/2015] methylPREDNISolone  8 mg Oral Q1400   Followed by  . [START ON 06/19/2015] methylPREDNISolone  4 mg Oral Q1400  . methylPREDNISolone  16 mg Oral QHS   Followed by  . [START ON 06/17/2015] methylPREDNISolone  8 mg Oral QHS   Followed by  . [START ON 06/20/2015] methylPREDNISolone  4 mg Oral QHS  . metoCLOPramide (REGLAN) injection  10 mg Intravenous Q6H  . ondansetron (ZOFRAN) IV  8 mg Intravenous Q8H  . pantoprazole (PROTONIX) IV  40 mg Intravenous Daily  . potassium chloride  40 mEq Oral Daily  . prenatal vitamin w/FE, FA  1 tablet Oral Q1200     Assessment/Plan: Hyperemesis gravidarum @ 15 weeks 1 day EGA Add reglan IV for antiemesis Continue with steroid taper Kdur with KCl rechek in AM  Magnesium level BRAT diet.   LOS: 2 days    [facility] 06/15/2015, 10:53 AM

## 2015-06-15 NOTE — Progress Notes (Signed)
CRITICAL VALUE ALERT  Critical value received:  Potassium 2.6  Date of notification: 06/15/15  Time of notification:  0620  Critical value read back: yes Nurse who received alert:  Lars Masson  MD notified (1st page): Prince Solian CMW  Time of first page: [phone]  MD notified (2nd page):NO  Time of second page:NA  Responding MD: Prince Solian  Time MD responded:  [phone]

## 2015-06-15 NOTE — Progress Notes (Signed)
Pt given Reglan per provider's order. Pt instructed that the PO meds will be given 1 hour following. Will continue to monitor. Toya Smothers, RN

## 2015-06-15 NOTE — Progress Notes (Signed)
Went to check patient after administration of PO meds. Pt appears to be sleeping. Will continue to monitor. Toya Smothers, RN

## 2015-06-16 LAB — MAGNESIUM: MAGNESIUM: 1.7 mg/dL (ref 1.7–2.4)

## 2015-06-16 LAB — BASIC METABOLIC PANEL
ANION GAP: 10 (ref 5–15)
CHLORIDE: 103 mmol/L (ref 101–111)
CO2: 23 mmol/L (ref 22–32)
Calcium: 9.2 mg/dL (ref 8.9–10.3)
Creatinine, Ser: 0.54 mg/dL (ref 0.44–1.00)
GFR calc Af Amer: >60 mL/min (ref >60)
GLUCOSE: 107 mg/dL — AB (ref 65–99)
POTASSIUM: 3.7 mmol/L (ref 3.5–5.1)
Sodium: 136 mmol/L (ref 135–145)

## 2015-06-16 LAB — GLUCOSE, CAPILLARY: Glucose-Capillary: 101 mg/dL — ABNORMAL HIGH (ref 65–99)

## 2015-06-16 MED ORDER — PANTOPRAZOLE SODIUM 40 MG PO TBEC
40.0000 mg | DELAYED_RELEASE_TABLET | Freq: Every day | ORAL | Status: DC
  Administered 2015-06-17: 40 mg via ORAL
  Filled 2015-06-16: qty 1

## 2015-06-16 MED ORDER — LABETALOL HCL 5 MG/ML IV SOLN
10.0000 mg | INTRAVENOUS | Status: DC | PRN
  Administered 2015-06-16: 10 mg via INTRAVENOUS
  Filled 2015-06-16: qty 4

## 2015-06-16 MED ORDER — DIPHENHYDRAMINE HCL 50 MG/ML IJ SOLN
25.0000 mg | Freq: Once | INTRAMUSCULAR | Status: AC
  Administered 2015-06-16: 25 mg via INTRAVENOUS
  Filled 2015-06-16: qty 1

## 2015-06-16 MED ORDER — DIPHENHYDRAMINE HCL 50 MG/ML IJ SOLN: 25.0000 mg | Freq: Four times a day (QID) | INTRAMUSCULAR | Status: DC | PRN

## 2015-06-16 NOTE — Progress Notes (Signed)
Patient ID: Evelyn Marshall, female   DOB: Sep 04, 1985, 30 Marshall.o.   MRN: [id number] Evelyn Marshall is a 30 Marshall.o. G3P1101 at [redacted]w[redacted]d admitted for hyperemesis  Subjective: Upon entering room, pt had just finished vomiting and said she did not feel good.  Had tried the toast and just couldn't keep that or any of the fluids down.    Objective: BP 147/93 mmHg  Pulse 75  Temp(Src) 98.5 F (36.9 C) (Oral)  Resp 18  Ht 5\' 4"  (1.626 m)  Wt 242 lb 4.2 oz (109.89 kg)  BMI 41.56 kg/m2  SpO2 98%  LMP 03/01/2015 (Exact Date) I/O last 3 completed shifts: In: 2638.2 [P.O.:300; I.V.:2288.2; IV Piggyback:50] Out: [id number] [Urine:4000; Emesis/NG output:650] Total I/O In: 574.2 [P.O.:120; I.V.:454.2] Out: 650 [Urine:250; Emesis/NG output:400]  Physical Exam:  Gen: alert Chest/Lungs: cta bilaterally  Heart/Pulse: RRR  Abdomen: soft, gravid, nontender Uterine fundus: soft, nontender Skin & Color: warm and dry  EXT: negative Homan's b/l, edema neg  FHT:  156 at 8am UC:   none SVE:    deferred  Labs: Lab Results  Component Value Date   WBC 11.4* 06/13/2015   HGB 10.1* 06/13/2015   HCT 30.3* 06/13/2015   MCV 71.3* 06/13/2015   PLT 339 06/13/2015    Assessment and Plan: has Hyperemesis gravidarum; Ptyalism; Chronic hypertension in pregnancy; Morbid obesity with BMI of 40.0-44.9, adult ([place]); Rh negative, maternal; and Hyperemesis on her problem list. Potassium repleted Cont steroid taper Will try benadryl as well Fetal status is reassuring Cont to observe  Evelyn Marshall 06/16/2015, 11:03 AM

## 2015-06-16 NOTE — Clinical Documentation Improvement (Signed)
OB/GYN  Abnormal Lab/Test Results:   Potassium:  5/23: 2.6. 5/21: 2.7.  Possible Clinical Conditions associated with below indicators  Hypokalemia  Other Condition  Cannot Clinically Determine   Treatment Provided: 5/22: D5 1/2 with 40 meq kcl @ 125 ml/hr. 5/22: Potassium chloride 40 meq po.   Please exercise your independent, professional judgment when responding. A specific answer is not anticipated or expected.   Thank You,  Nambe [phone]

## 2015-06-16 NOTE — Progress Notes (Signed)
BP 158/108. 10 mg IV labetalol given. Will recheck in 10 minutes

## 2015-06-17 DIAGNOSIS — E876 Hypokalemia: Secondary | ICD-10-CM | POA: Diagnosis present

## 2015-06-17 LAB — CBC
HCT: 29.5 % — ABNORMAL LOW (ref 36.0–46.0)
HEMOGLOBIN: 9.7 g/dL — AB (ref 12.0–15.0)
MCH: 23.7 pg — ABNORMAL LOW (ref 26.0–34.0)
MCHC: 32.9 g/dL (ref 30.0–36.0)
MCV: 72.1 fL — ABNORMAL LOW (ref 78.0–100.0)
Platelets: 396 10*3/uL (ref 150–400)
RBC: 4.09 MIL/uL (ref 3.87–5.11)
RDW: 18.1 % — AB (ref 11.5–15.5)
WBC: 12.1 10*3/uL — AB (ref 4.0–10.5)

## 2015-06-17 LAB — COMPREHENSIVE METABOLIC PANEL
ALBUMIN: 2.9 g/dL — AB (ref 3.5–5.0)
ALK PHOS: 84 U/L (ref 38–126)
ALT: 130 U/L — ABNORMAL HIGH (ref 14–54)
ANION GAP: 8 (ref 5–15)
AST: 51 U/L — AB (ref 15–41)
CO2: 21 mmol/L — AB (ref 22–32)
Calcium: 8.7 mg/dL — ABNORMAL LOW (ref 8.9–10.3)
Chloride: 102 mmol/L (ref 101–111)
Creatinine, Ser: 0.51 mg/dL (ref 0.44–1.00)
GFR calc Af Amer: >60 mL/min (ref >60)
GFR calc non Af Amer: >60 mL/min (ref >60)
GLUCOSE: 111 mg/dL — AB (ref 65–99)
POTASSIUM: 3.9 mmol/L (ref 3.5–5.1)
SODIUM: 131 mmol/L — AB (ref 135–145)
Total Bilirubin: 0.5 mg/dL (ref 0.3–1.2)
Total Protein: 6.6 g/dL (ref 6.5–8.1)

## 2015-06-17 LAB — GLUCOSE, CAPILLARY: Glucose-Capillary: 118 mg/dL — ABNORMAL HIGH (ref 65–99)

## 2015-06-17 NOTE — Progress Notes (Addendum)
Hospital day # 4 pregnancy at [redacted]w[redacted]d with hyperemesis currently on steroid taper, protonix, reglan and zofran. Also CHTN on Labetalol  S: Feeling much better and hopeful. Tolerated her breakfast today. Epigastric pain is resolved.  O: BP 135/81 mmHg  Pulse 90  Temp(Src) 99.3 F (37.4 C) (Oral)  Resp 16  Ht 5\' 4"  (1.626 m)  Wt 241 lb 12 oz (109.657 kg)  BMI 41.48 kg/m2  SpO2 98%  LMP 03/01/2015 (Exact Date)  A: [redacted]w[redacted]d with hyperemesis     gradually improving  Labs: Sodium at 131, AST 51, ALT 130, Prealbumin 12.7, normal thyroid panel   P: continue current plan of care      Reviewed grazing instead of eating, high carb foods and not focusing on healthy foods, GERD precautions, possible d/c home this evening with same meds regimen  Evelyn Marshall A  MD 06/17/2015 12:46 PM   Called in room for difficulty locating FHT Rapid response nurse reports hearing fetal movement but unable to document FHT Limited bedside ultrasound: very active fetus with FHT 140 bpm Reassurance

## 2015-06-17 NOTE — Discharge Instructions (Signed)
Hyperemesis Gravidarum Hyperemesis gravidarum is a severe form of nausea and vomiting that happens during pregnancy. Hyperemesis is worse than morning sickness. It may cause you to have nausea or vomiting all day for many days. It may keep you from eating and drinking enough food and liquids. Hyperemesis usually occurs during the first half (the first 20 weeks) of pregnancy. It often goes away once a woman is in her second half of pregnancy. However, sometimes hyperemesis continues through an entire pregnancy.  CAUSES  The cause of this condition is not completely known but is thought to be related to changes in the body's hormones when pregnant. It could be from the high level of the pregnancy hormone or an increase in estrogen in the body.  SIGNS AND SYMPTOMS   Severe nausea and vomiting.  Nausea that does not go away.  Vomiting that does not allow you to keep any food down.  Weight loss and body fluid loss (dehydration).  Having no desire to eat or not liking food you have previously enjoyed. DIAGNOSIS  Your health care provider will do a physical exam and ask you about your symptoms. He or she may also order blood tests and urine tests to make sure something else is not causing the problem.  TREATMENT  You may only need medicine to control the problem. If medicines do not control the nausea and vomiting, you will be treated in the hospital to prevent dehydration, increased acid in the blood (acidosis), weight loss, and changes in the electrolytes in your body that may harm the unborn baby (fetus). You may need IV fluids.  HOME CARE INSTRUCTIONS   Only take over-the-counter or prescription medicines as directed by your health care provider.  Try eating a couple of dry crackers or toast in the morning before getting out of bed.  Avoid foods and smells that upset your stomach.  Avoid fatty and spicy foods.  Eat 5-6 small meals a day.  Do not drink when eating meals. Drink between  meals.  For snacks, eat high-protein foods, such as cheese.  Eat or suck on things that have ginger in them. Ginger helps nausea.  Avoid food preparation. The smell of food can spoil your appetite.  Avoid iron pills and iron in your multivitamins until after 3-4 months of being pregnant. However, consult with your health care provider before stopping any prescribed iron pills. SEEK MEDICAL CARE IF:   Your abdominal pain increases.  You have a severe headache.  You have vision problems.  You are losing weight. SEEK IMMEDIATE MEDICAL CARE IF:   You are unable to keep fluids down.  You vomit blood.  You have constant nausea and vomiting.  You have excessive weakness.  You have extreme thirst.  You have dizziness or fainting.  You have a fever or persistent symptoms for more than 2-3 days.  You have a fever and your symptoms suddenly get worse. MAKE SURE YOU:   Understand these instructions.  Will watch your condition.  Will get help right away if you are not doing well or get worse.   This information is not intended to replace advice given to you by your health care provider. Make sure you discuss any questions you have with your health care provider.   Document Released: 01/09/2005 Document Revised: 10/30/2012 Document Reviewed: 08/21/2012 Elsevier Interactive Patient Education 2016 [place] for Hyperemesis Gravidarum Severe cases of hyperemesis gravidarum can lead to dehydration and malnutrition. The hyperemesis eating plan is one  way to lessen the symptoms of nausea and vomiting. It is often used with prescribed medicines to control your symptoms.  WHAT CAN I DO TO RELIEVE MY SYMPTOMS? Listen to your body. Everyone is different and has different preferences. Find what works best for you. Some of the following things may help:  Eat and drink slowly.  Eat 5-6 small meals daily instead of 3 large meals.   Eat crackers before you get out of bed  in the morning.   Starchy foods are usually well tolerated (such as cereal, toast, bread, potatoes, pasta, rice, and pretzels).   Ginger may help with nausea. Add  tsp ground ginger to hot tea or choose ginger tea.   Try drinking 100% fruit juice or an electrolyte drink.  Continue to take your prenatal vitamins as directed by your health care provider. If you are having trouble taking your prenatal vitamins, talk with your health care provider about different options.  Include at least 1 serving of protein with your meals and snacks (such as meats or poultry, beans, nuts, eggs, or yogurt). Try eating a protein-rich snack before bed (such as cheese and crackers or a half [place] or peanut butter sandwich). WHAT THINGS SHOULD I AVOID TO REDUCE MY SYMPTOMS? The following things may help reduce your symptoms:  Avoid foods with strong smells. Try eating meals in well-ventilated areas that are free of odors.  Avoid drinking water or other beverages with meals. Try not to drink anything less than 30 minutes before and after meals.  Avoid drinking more than 1 cup of fluid at a time.  Avoid fried or high-fat foods, such as butter and cream sauces.  Avoid spicy foods.  Avoid skipping meals the best you can. Nausea can be more intense on an empty stomach. If you cannot tolerate food at that time, do not force it. Try sucking on ice chips or other frozen items and make up the calories later.  Avoid lying down within 2 hours after eating.   This information is not intended to replace advice given to you by your health care provider. Make sure you discuss any questions you have with your health care provider.   Document Released: 11/06/2006 Document Revised: 01/14/2013 Document Reviewed: 11/13/2012 Elsevier Interactive Patient Education 2016 [place] of Pregnancy The second trimester is from week 13 through week 28, months 4 through 6. The second trimester is often a time  when you feel your best. Your body has also adjusted to being pregnant, and you begin to feel better physically. Usually, morning sickness has lessened or quit completely, you may have more energy, and you may have an increase in appetite. The second trimester is also a time when the fetus is growing rapidly. At the end of the sixth month, the fetus is about 9 inches long and weighs about 1 pounds. You will likely begin to feel the baby move (quickening) between 18 and 20 weeks of the pregnancy. BODY CHANGES Your body goes through many changes during pregnancy. The changes vary from woman to woman.   Your weight will continue to increase. You will notice your lower abdomen bulging out.  You may begin to get stretch marks on your hips, abdomen, and breasts.  You may develop headaches that can be relieved by medicines approved by your health care provider.  You may urinate more often because the fetus is pressing on your bladder.  You may develop or continue to have heartburn as a result of  your pregnancy.  You may develop constipation because certain hormones are causing the muscles that push waste through your intestines to slow down.  You may develop hemorrhoids or swollen, bulging veins (varicose veins).  You may have back pain because of the weight gain and pregnancy hormones relaxing your joints between the bones in your pelvis and as a result of a shift in weight and the muscles that support your balance.  Your breasts will continue to grow and be tender.  Your gums may bleed and may be sensitive to brushing and flossing.  Dark spots or blotches (chloasma, mask of pregnancy) may develop on your face. This will likely fade after the baby is born.  A dark line from your belly button to the pubic area (linea nigra) may appear. This will likely fade after the baby is born.  You may have changes in your hair. These can include thickening of your hair, rapid growth, and changes in  texture. Some women also have hair loss during or after pregnancy, or hair that feels dry or thin. Your hair will most likely return to normal after your baby is born. WHAT TO EXPECT AT YOUR PRENATAL VISITS During a routine prenatal visit:  You will be weighed to make sure you and the fetus are growing normally.  Your blood pressure will be taken.  Your abdomen will be measured to track your baby's growth.  The fetal heartbeat will be listened to.  Any test results from the previous visit will be discussed. Your health care provider may ask you:  How you are feeling.  If you are feeling the baby move.  If you have had any abnormal symptoms, such as leaking fluid, bleeding, severe headaches, or abdominal cramping.  If you are using any tobacco products, including cigarettes, chewing tobacco, and electronic cigarettes.  If you have any questions. Other tests that may be performed during your second trimester include:  Blood tests that check for:  Low iron levels (anemia).  Gestational diabetes (between 24 and 28 weeks).  Rh antibodies.  Urine tests to check for infections, diabetes, or protein in the urine.  An ultrasound to confirm the proper growth and development of the baby.  An amniocentesis to check for possible genetic problems.  Fetal screens for spina bifida and Down syndrome.  HIV (human immunodeficiency virus) testing. Routine prenatal testing includes screening for HIV, unless you choose not to have this test. HOME CARE INSTRUCTIONS   Avoid all smoking, herbs, alcohol, and unprescribed drugs. These chemicals affect the formation and growth of the baby.  Do not use any tobacco products, including cigarettes, chewing tobacco, and electronic cigarettes. If you need help quitting, ask your health care provider. You may receive counseling support and other resources to help you quit.  Follow your health care provider's instructions regarding medicine use. There  are medicines that are either safe or unsafe to take during pregnancy.  Exercise only as directed by your health care provider. Experiencing uterine cramps is a good sign to stop exercising.  Continue to eat regular, healthy meals.  Wear a good support bra for breast tenderness.  Do not use hot tubs, steam rooms, or saunas.  Wear your seat belt at all times when driving.  Avoid raw meat, uncooked cheese, cat litter boxes, and soil used by cats. These carry germs that can cause birth defects in the baby.  Take your prenatal vitamins.  Take 1500-2000 mg of calcium daily starting at the 20th week of pregnancy  until you deliver your baby.  Try taking a stool softener (if your health care provider approves) if you develop constipation. Eat more high-fiber foods, such as fresh vegetables or fruit and whole grains. Drink plenty of fluids to keep your urine clear or pale yellow.  Take warm sitz baths to soothe any pain or discomfort caused by hemorrhoids. Use hemorrhoid cream if your health care provider approves.  If you develop varicose veins, wear support hose. Elevate your feet for 15 minutes, 3-4 times a day. Limit salt in your diet.  Avoid heavy lifting, wear low heel shoes, and practice good posture.  Rest with your legs elevated if you have leg cramps or low back pain.  Visit your dentist if you have not gone yet during your pregnancy. Use a soft toothbrush to brush your teeth and be gentle when you floss.  A sexual relationship may be continued unless your health care provider directs you otherwise.  Continue to go to all your prenatal visits as directed by your health care provider. SEEK MEDICAL CARE IF:   You have dizziness.  You have mild pelvic cramps, pelvic pressure, or nagging pain in the abdominal area.  You have persistent nausea, vomiting, or diarrhea.  You have a bad smelling vaginal discharge.  You have pain with urination. SEEK IMMEDIATE MEDICAL CARE IF:    You have a fever.  You are leaking fluid from your vagina.  You have spotting or bleeding from your vagina.  You have severe abdominal cramping or pain.  You have rapid weight gain or loss.  You have shortness of breath with chest pain.  You notice sudden or extreme swelling of your face, hands, ankles, feet, or legs.  You have not felt your baby move in over an hour.  You have severe headaches that do not go away with medicine.  You have vision changes.   This information is not intended to replace advice given to you by your health care provider. Make sure you discuss any questions you have with your health care provider.   Document Released: 01/03/2001 Document Revised: 01/30/2014 Document Reviewed: 03/12/2012 Elsevier Interactive Patient Education [employer].

## 2015-06-18 LAB — GLUCOSE, CAPILLARY: GLUCOSE-CAPILLARY: 91 mg/dL (ref 65–99)

## 2015-06-18 MED ORDER — LABETALOL HCL 100 MG PO TABS: 100.0000 mg | ORAL_TABLET | Freq: Two times a day (BID) | ORAL | Status: DC

## 2015-06-18 MED ORDER — METHYLPREDNISOLONE 4 MG PO TABS: 4.0000 mg | ORAL_TABLET | Freq: Every day | ORAL | Status: DC

## 2015-06-18 MED ORDER — DOCUSATE SODIUM 100 MG PO CAPS: 100.0000 mg | ORAL_CAPSULE | Freq: Two times a day (BID) | ORAL | Status: DC

## 2015-06-18 NOTE — Discharge Summary (Signed)
Discharge Summary for [facility] Antenatal Obstetrical Patient  Evelyn Marshall  DOB:    [birth year] MRN:    [id number] CSN:    [id number]  Date of admission:                  06/12/2015  Date of discharge:                   06/18/2015  Admission Diagnosis:  [redacted]w[redacted]d  Hyperemesis gravidarum  Obesity  Anemia  Ketonuria  Discharge Diagnosis:  15 week 4 day gestation  Hyperemesis gravidarum  Obesity  Anemia  Ketonuria  Hypokalemia  Elevated liver enzymes  Procedures this admission:  None  History of Present Illness:  Evelyn Marshall is a 30 y.o. female, G3P1101, who presents at [redacted]w[redacted]d weeks gestation. The patient has been followed at the [facility] Gynecology division of [facility]. She was admitted for  Management of hyperemesis gravidarum. Her pregnancy has been complicated by: Hyperemesis. See admission diagnoses.  Hospital course:  The patient was admitted for management of hyperemesis.   The patient did not respond to IV fluids and anti-emetics. She eventually required a steroid taper to manage her hyperemesis. She was noted to be hypokalemic and her potassium was replaced. Her liver enzymes increased and we monitored that. Her blood pressure if increased and she was started on labetalol to manage her hypertension. On the day of discharge she was able to tolerate a regular diet. She was discharged to home doing well.  Discharge Physical Exam:  BP 137/82 mmHg  Pulse 91  Temp(Src) 98.4 F (36.9 C) (Oral)  Resp 18  Ht 5\' 4"  (1.626 m)  Wt 242 lb (109.77 kg)  BMI 41.52 kg/m2  SpO2 99%  LMP 03/01/2015 (Exact Date)   Discharge Information:  Activity:           unrestricted Diet:                Bland Medications: Prenatal vitamins, steroid taper with Medrol, Reglan, Zofran, Protonix, labetalol 100 mg twice each day.  The following is a 14 day taper of methylprednisolone for hyperemesis. Doses on day 1  will be given IV. All doses starting on day 2 will be given PO. (If patient cannot tolerate oral medications, contact the pharmacy to change route to IV.)   Date Day Morning Midday Bedtime  06/13/15 1 16  mg 16 mg 16 mg  06/14/15 2 16  mg 16 mg 16 mg  06/15/15 3 16  mg 16 mg 16 mg  06/16/15 4 16  mg 8 mg 16 mg  06/17/15 5 16  mg 8 mg 8 mg  06/18/15 6 8  mg 8 mg 8 mg  06/19/15 7 8  mg 4 mg 8 mg  06/20/15 8 8  mg 4 mg 4 mg  06/21/15 9 8  mg 4 mg   06/22/15 10 8  mg 4 mg   06/23/15 11 8  mg    06/24/15 12 8  mg    06/25/15 13 4  mg    06/26/15 14 4  mg         Condition:      improved Instructions:  The patient was discharged with instructions for managing hyperemesis. She will return to our office in 2 weeks. Discharge to: home  Follow-up Information    Follow up with [facility] In 2 weeks.   Specialty:  Obstetrics and Gynecology   Contact information:   [address] [place] [address]  [phone]        Eli Hose 06/18/2015

## 2015-06-18 NOTE — Progress Notes (Signed)
Pt d/c'd home with written instructions and verbalizes and understanding. All belongings sent home with patient. Toya Smothers, RN

## 2015-11-07 ENCOUNTER — Inpatient Hospital Stay (HOSPITAL_COMMUNITY)
Admission: AD | Admit: 2015-11-07 | Discharge: 2015-11-07 | Disposition: A | Discharge status: Home / Self Care | Payer: BC Managed Care – PPO | Source: Ambulatory Visit | Attending: Obstetrics & Gynecology | Admitting: Obstetrics & Gynecology

## 2015-11-07 ENCOUNTER — Encounter (HOSPITAL_COMMUNITY): Payer: Self-pay

## 2015-11-07 DIAGNOSIS — M549 Dorsalgia, unspecified: Secondary | ICD-10-CM | POA: Diagnosis present

## 2015-11-07 DIAGNOSIS — O26893 Other specified pregnancy related conditions, third trimester: Secondary | ICD-10-CM | POA: Insufficient documentation

## 2015-11-07 DIAGNOSIS — O9989 Other specified diseases and conditions complicating pregnancy, childbirth and the puerperium: Secondary | ICD-10-CM

## 2015-11-07 DIAGNOSIS — Z3A35 35 weeks gestation of pregnancy: Secondary | ICD-10-CM

## 2015-11-07 DIAGNOSIS — S39012A Strain of muscle, fascia and tendon of lower back, initial encounter: Secondary | ICD-10-CM | POA: Diagnosis not present

## 2015-11-07 DIAGNOSIS — M546 Pain in thoracic spine: Secondary | ICD-10-CM | POA: Diagnosis not present

## 2015-11-07 DIAGNOSIS — S29012A Strain of muscle and tendon of back wall of thorax, initial encounter: Secondary | ICD-10-CM

## 2015-11-07 LAB — URINALYSIS, ROUTINE W REFLEX MICROSCOPIC
BILIRUBIN URINE: NEGATIVE
Glucose, UA: NEGATIVE mg/dL
Ketones, ur: 15 mg/dL — AB
NITRITE: NEGATIVE
PH: 6 (ref 5.0–8.0)
Protein, ur: 100 mg/dL — AB

## 2015-11-07 LAB — URINE MICROSCOPIC-ADD ON

## 2015-11-07 MED ORDER — CYCLOBENZAPRINE HCL 10 MG PO TABS
10.0000 mg | ORAL_TABLET | Freq: Once | ORAL | Status: AC
  Administered 2015-11-07: 10 mg via ORAL
  Filled 2015-11-07: qty 1

## 2015-11-07 MED ORDER — CYCLOBENZAPRINE HCL 10 MG PO TABS: 10.0000 mg | ORAL_TABLET | Freq: Two times a day (BID) | ORAL | 0 refills | Status: DC | PRN

## 2015-11-07 NOTE — MAU Note (Signed)
Pt presents to MAU with back pain. Has been having back pain on and off through pregnancy. Pain came Friday and has not subsided yet. Reports she has used ice/heat and tylenol without relief. Denies bleeding or leaking of fluid. Reports Good fetal movement

## 2015-11-07 NOTE — Discharge Instructions (Signed)
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
## 2015-11-11 ENCOUNTER — Observation Stay (HOSPITAL_COMMUNITY)
Admission: AD | Admit: 2015-11-11 | Discharge: 2015-11-13 | Disposition: A | Discharge status: Home / Self Care | Payer: BC Managed Care – PPO | Source: Ambulatory Visit | Attending: Obstetrics and Gynecology | Admitting: Obstetrics and Gynecology

## 2015-11-11 ENCOUNTER — Encounter (HOSPITAL_COMMUNITY): Payer: Self-pay

## 2015-11-11 DIAGNOSIS — O1403 Mild to moderate pre-eclampsia, third trimester: Principal | ICD-10-CM | POA: Insufficient documentation

## 2015-11-11 DIAGNOSIS — Z8249 Family history of ischemic heart disease and other diseases of the circulatory system: Secondary | ICD-10-CM | POA: Diagnosis not present

## 2015-11-11 DIAGNOSIS — Z3A36 36 weeks gestation of pregnancy: Secondary | ICD-10-CM | POA: Diagnosis not present

## 2015-11-11 DIAGNOSIS — O99213 Obesity complicating pregnancy, third trimester: Secondary | ICD-10-CM | POA: Diagnosis not present

## 2015-11-11 DIAGNOSIS — Z79899 Other long term (current) drug therapy: Secondary | ICD-10-CM | POA: Diagnosis not present

## 2015-11-11 DIAGNOSIS — O1400 Mild to moderate pre-eclampsia, unspecified trimester: Secondary | ICD-10-CM | POA: Diagnosis present

## 2015-11-11 DIAGNOSIS — O139 Gestational [pregnancy-induced] hypertension without significant proteinuria, unspecified trimester: Secondary | ICD-10-CM | POA: Diagnosis present

## 2015-11-11 DIAGNOSIS — Z9889 Other specified postprocedural states: Secondary | ICD-10-CM | POA: Diagnosis not present

## 2015-11-11 MED ORDER — DOCUSATE SODIUM 100 MG PO CAPS
100.0000 mg | ORAL_CAPSULE | Freq: Every day | ORAL | Status: DC
  Administered 2015-11-13: 100 mg via ORAL
  Filled 2015-11-11 (×2): qty 1

## 2015-11-11 MED ORDER — ZOLPIDEM TARTRATE 5 MG PO TABS: 5.0000 mg | ORAL_TABLET | Freq: Every evening | ORAL | Status: DC | PRN

## 2015-11-11 MED ORDER — BETAMETHASONE SOD PHOS & ACET 6 (3-3) MG/ML IJ SUSP
12.0000 mg | INTRAMUSCULAR | Status: AC
  Administered 2015-11-11 – 2015-11-12 (×2): 12 mg via INTRAMUSCULAR
  Filled 2015-11-11 (×2): qty 2

## 2015-11-11 MED ORDER — ACETAMINOPHEN 325 MG PO TABS: 650.0000 mg | ORAL_TABLET | ORAL | Status: DC | PRN

## 2015-11-11 MED ORDER — PRENATAL MULTIVITAMIN CH
1.0000 | ORAL_TABLET | Freq: Every day | ORAL | Status: DC
  Administered 2015-11-12 – 2015-11-13 (×2): 1 via ORAL
  Filled 2015-11-11 (×2): qty 1

## 2015-11-11 MED ORDER — BETAMETHASONE SOD PHOS & ACET 6 (3-3) MG/ML IJ SUSP: 12.0000 mg | Freq: Once | INTRAMUSCULAR | Status: DC

## 2015-11-11 MED ORDER — CALCIUM CARBONATE ANTACID 500 MG PO CHEW: 2.0000 | CHEWABLE_TABLET | ORAL | Status: DC | PRN

## 2015-11-11 NOTE — H&P (Addendum)
Admission History and Physical Exam for an Obstetrics Patient  Evelyn Marshall is a 30 y.o. female, G3P1101, at [redacted]w[redacted]d gestation, who presents for management of pregnancy-induced hypertension. She has been followed at the [facility] and Gynecology division of [facility].  Her pregnancy has been complicated by obesity, a past history of preeclampsia with her first pregnancy, and hypertension during third trimester of this pregnancy. She was seen in the office on 11/10/2015 and was noted to have proteinuria. [place] labs were normal except for protein to creatinine ratio that was elevated. The patient denies headaches, blurred vision, and right upper quadrant tenderness. An ultrasound yesterday showed the baby was in a vertex presentation. The fluid level was normal. Her biophysical profile was 8 out of 8. See history below.  OB History    Gravida Para Term Preterm AB Living   3 2 1 1  0 1   SAB TAB Ectopic Multiple Live Births   0       2      Past Medical History:  Diagnosis Date  . Abnormal Pap smear 2012   colpo result benign  . Pregnancy induced hypertension     Prescriptions Prior to Admission  Medication Sig Dispense Refill Last Dose  . cyclobenzaprine (FLEXERIL) 10 MG tablet Take 1 tablet (10 mg total) by mouth 2 (two) times daily as needed for muscle spasms. 20 tablet 0 Past Week at Unknown time  . ondansetron (ZOFRAN-ODT) 8 MG disintegrating tablet Take 8 mg by mouth every 8 (eight) hours as needed for nausea or vomiting.   Past Week at Unknown time  . pantoprazole (PROTONIX) 40 MG tablet Take 40 mg by mouth at bedtime.   11/10/2015 at Unknown time  . Prenatal MV-Min-FA-Omega-3 (PRENATAL GUMMIES/DHA & FA) 0.4-32.5 MG CHEW Chew 2 each by mouth at bedtime.   11/10/2015 at Unknown time    Past Surgical History:  Procedure Laterality Date  . WISDOM TOOTH EXTRACTION      No Known Allergies  Family History: family history includes Heart disease  in her father; Hypertension in her father.  Social History:  reports that she has never smoked. She has never used smokeless tobacco. She reports that she does not drink alcohol or use drugs.  Review of systems: Normal pregnancy complaints.  Admission Physical Exam:    Body mass index is 46.93 kg/m.  Blood pressure 142/80, pulse 101, temperature 98.3 F (36.8 C), temperature source Oral, resp. rate 18, height 5\' 4"  (1.626 m), weight 273 lb 6.4 oz (124 kg), last menstrual period 03/01/2015.  HEENT:                 Within normal limits Chest:                   Clear Heart:                    Regular rate and rhythm Abdomen:             Gravid and nontender Extremities:          Grossly normal Neurologic exam: Grossly normal Pelvic exam:         Cervix: 2 cm yesterday  NST: Cat 1  Prenatal labs: ABO, Rh:             --/--/O NEG (04/26 0000) HBsAg:                 Negative (03/30 1825)  HIV:  nonreactive GBS:                      pending from 11/10/2015 Antibody:              NEG (04/26 0000) Rubella:                Immune RPR:                     nonreactive      Prenatal Transfer Tool  Maternal Diabetes: No Genetic Screening: Normal Maternal Ultrasounds/Referrals: Normal Fetal Ultrasounds or other Referrals:  None Maternal Substance Abuse:  No Significant Maternal Medications:  None Significant Maternal Lab Results:  Elevated protein creatinine ratio Other Comments:  PIH labs normal except for those above. Biophysical profile on 11/10/2015 showed a vertex presentation with normal amniotic fluid. BPP was 8 out of 8.  Assessment:  [redacted]w[redacted]d gestation  Pregnancy-induced hypertension  Rule out preeclampsia  Obesity  History of preeclampsia with first pregnancy  Blood type O negative  Plan:  Observed the patient overnight.  We will give betamethasone in case we do need to proceed with delivery.  Group B strep is pending from  11/10/2015.  Repeat [place] labs on 11/12/2015   Eli Hose 11/11/2015, 6:01 PM

## 2015-11-11 NOTE — MAU Note (Signed)
Pt sent over from office for elevated  134/90 and had protein in her urine yesterday. Denies H/A , or visual changes.

## 2015-11-12 ENCOUNTER — Other Ambulatory Visit: Payer: Self-pay | Admitting: Obstetrics & Gynecology

## 2015-11-12 DIAGNOSIS — O1400 Mild to moderate pre-eclampsia, unspecified trimester: Secondary | ICD-10-CM | POA: Diagnosis present

## 2015-11-12 HISTORY — DX: Mild to moderate pre-eclampsia, unspecified trimester: O14.00

## 2015-11-12 LAB — CBC
HEMATOCRIT: 28 % — AB (ref 36.0–46.0)
HEMOGLOBIN: 8.9 g/dL — AB (ref 12.0–15.0)
MCH: 21.4 pg — ABNORMAL LOW (ref 26.0–34.0)
MCHC: 31.8 g/dL (ref 30.0–36.0)
MCV: 67.5 fL — ABNORMAL LOW (ref 78.0–100.0)
Platelets: 346 10*3/uL (ref 150–400)
RBC: 4.15 MIL/uL (ref 3.87–5.11)
RDW: 18 % — ABNORMAL HIGH (ref 11.5–15.5)
WBC: 12.5 10*3/uL — ABNORMAL HIGH (ref 4.0–10.5)

## 2015-11-12 LAB — COMPREHENSIVE METABOLIC PANEL
ALBUMIN: 2.7 g/dL — AB (ref 3.5–5.0)
ALK PHOS: 151 U/L — AB (ref 38–126)
ALT: 11 U/L — ABNORMAL LOW (ref 14–54)
ANION GAP: 10 (ref 5–15)
AST: 17 U/L (ref 15–41)
BILIRUBIN TOTAL: 0.6 mg/dL (ref 0.3–1.2)
BUN: 6 mg/dL (ref 6–20)
CALCIUM: 9.2 mg/dL (ref 8.9–10.3)
CO2: 19 mmol/L — ABNORMAL LOW (ref 22–32)
Chloride: 105 mmol/L (ref 101–111)
Creatinine, Ser: 0.52 mg/dL (ref 0.44–1.00)
GFR calc Af Amer: >60 mL/min (ref >60)
GFR calc non Af Amer: >60 mL/min (ref >60)
GLUCOSE: 103 mg/dL — AB (ref 65–99)
Potassium: 3.9 mmol/L (ref 3.5–5.1)
Sodium: 134 mmol/L — ABNORMAL LOW (ref 135–145)
TOTAL PROTEIN: 7.3 g/dL (ref 6.5–8.1)

## 2015-11-12 NOTE — Progress Notes (Addendum)
Antepartum LOS: Evelyn Marshall, 30 y.o.,   OB History    Gravida Para Term Preterm AB Living   3 2 1 1  0 1   SAB TAB Ectopic Multiple Live Births   0       2      Subjective -Patient resting in bed.  Denies pain, N/V, headache, visual disturbances, and SOB.  Reports some mild edema. Reports fetal movement.  Objective  Vitals:   11/11/15 1855 11/11/15 2018 11/12/15 0020 11/12/15 0810  BP: (!) 133/91 (!) 138/93 116/73 123/78  Pulse:  99  80  Resp:  18 18 16   Temp: 98.4 F (36.9 C)  98.4 F (36.9 C) 98.6 F (37 C)  TempSrc: Oral  Oral Oral  SpO2:  100%    Weight:      Height:        Results for orders placed or performed during the hospital encounter of 11/11/15 (from the past 24 hour(s))  Type and screen [place]     Status: None (Preliminary result)   Collection Time: 11/11/15  4:40 PM  Result Value Ref Range   ABO/RH(D) O NEG    Antibody Screen POS    Sample Expiration 11/14/2015    DAT, IgG NEG    Antibody Identification PASSIVELY ACQUIRED ANTI-D    Unit Number [id number]    Blood Component Type RED CELLS,LR    Unit division 00    Status of Unit ALLOCATED    Transfusion Status OK TO TRANSFUSE    Crossmatch Result COMPATIBLE    Unit Number [id number]    Blood Component Type RED CELLS,LR    Unit division 00    Status of Unit ALLOCATED    Transfusion Status OK TO TRANSFUSE    Crossmatch Result COMPATIBLE   CBC     Status: Abnormal   Collection Time: 11/12/15  5:47 AM  Result Value Ref Range   WBC 12.5 (H) 4.0 - 10.5 K/uL   RBC 4.15 3.87 - 5.11 MIL/uL   Hemoglobin 8.9 (L) 12.0 - 15.0 g/dL   HCT 28.0 (L) 36.0 - 46.0 %   MCV 67.5 (L) 78.0 - 100.0 fL   MCH 21.4 (L) 26.0 - 34.0 pg   MCHC 31.8 30.0 - 36.0 g/dL   RDW 18.0 (H) 11.5 - 15.5 %   Platelets 346 150 - 400 K/uL  Comprehensive metabolic panel     Status: Abnormal   Collection Time: 11/12/15  5:47 AM  Result Value Ref Range   Sodium 134 (L) 135 - 145 mmol/L   Potassium  3.9 3.5 - 5.1 mmol/L   Chloride 105 101 - 111 mmol/L   CO2 19 (L) 22 - 32 mmol/L   Glucose, Bld 103 (H) 65 - 99 mg/dL   BUN 6 6 - 20 mg/dL   Creatinine, Ser 0.52 0.44 - 1.00 mg/dL   Calcium 9.2 8.9 - 10.3 mg/dL   Total Protein 7.3 6.5 - 8.1 g/dL   Albumin 2.7 (L) 3.5 - 5.0 g/dL   AST 17 15 - 41 U/L   ALT 11 (L) 14 - 54 U/L   Alkaline Phosphatase 151 (H) 38 - 126 U/L   Total Bilirubin 0.6 0.3 - 1.2 mg/dL   GFR calc non Af Amer >60 >60 mL/min   GFR calc Af Amer >60 >60 mL/min   Anion gap 10 5 - 15    Meds: Scheduled Meds: . betamethasone acetate-betamethasone sodium phosphate  12 mg Intramuscular Q24 Hr  x 2  . docusate sodium  100 mg Oral Daily  . prenatal multivitamin  1 tablet Oral Q1200   Continuous Infusions:  PRN Meds:.acetaminophen, calcium carbonate, zolpidem   Physical Exam  Constitutional: She is oriented to person, place, and time. She appears well-developed and well-nourished. No distress.  HENT:  Head: Normocephalic and atraumatic.  Eyes: Conjunctivae and EOM are normal.  Neck: Normal range of motion.  Cardiovascular: Normal rate, regular rhythm and normal heart sounds.   Pulmonary/Chest: Effort normal and breath sounds normal.  Abdominal: Soft. Bowel sounds are normal.  Gravid  Musculoskeletal: Normal range of motion. She exhibits no edema.  Neurological: She is alert and oriented to person, place, and time.  Skin: Skin is warm and dry.  Psychiatric: She has a normal mood and affect. Her behavior is normal.  :   Monitoring Type: Continuous Time:1008-1028 FHR: 135 bpm, Mod Var, -Decels, +Accels UC: Mild graphed Q2-59min  Assessment IUP at [redacted]w[redacted]d Mild PreEclampsia  Plan Discussed monitoring of BP throughout the day 2nd Dose of BMZ at 1950 Discontinuation of 24hr Urine at 2000 Discussed possible discharge tonight dependent on bp Discussed POC for remainder of pregnancy including *BP check in office on Monday 10/23 *BPP in office on Wednesday  10/25 *Plan for IOL on Friday 10/27 *Remain OOW and on modified bedrest until induction No q/c Dr. Wilma Flavin updated on patient status and advises okay for NST qshift-order placed Continue other mgmt as ordered Dr.EK to follow as appropriate   Maryann Conners, MSN, CNM 11/12/2015, 10:58 AM    I saw and examined patient above an agree with above findings and assessment.  Patient meets criteria of preeclampsia without severe features based on in office protein creatinine ratio of 0.548 and some elevated BPs (but not in severe range) during current admission.  Patient will get her second betamethasone dose today at 2000 as well as complete her 24 hr urine collection about then.  If she remains stable and without severe features of preeclampsia she will be discharged to home after 2000 and then she will come in for induction of labor at [redacted] weeks EGA, patient to present as a direct admit on late evening of 10/22 so as to start induction on 10/23.  This plan was reviewed by patient and she agrees.  Strict preeclampsia precautions were reviewed with the patient and she expressed understanding.  Recent GBS test results from office show GBS positive and patient is to get prophylaxis in labor.   Dr. Alesia Richards. 11/12/2015 @ 1510

## 2015-11-13 LAB — PROTEIN, URINE, 24 HOUR
COLLECTION INTERVAL-UPROT: 24 h
Protein, 24H Urine: 1092 mg/d — ABNORMAL HIGH (ref 50–100)
Protein, Urine: 84 mg/dL
URINE TOTAL VOLUME-UPROT: 1300 mL

## 2015-11-13 NOTE — Discharge Summary (Signed)
[facility] Discharge Summary   Patient Name:   Evelyn Marshall DOB:     1985/12/07 MRN:     [id number]  Date of Admission:   11/11/2015 Date of Discharge:  11/13/2015  Admitting diagnosis:    36w blood pressure checks for preeclampsia Active Problems:   Pregnancy induced hypertension   Mild preeclampsia  Preeclampsia (mild)    Discharge diagnosis:    36w blood pressure checks for preeclampsia Active Problems:   Pregnancy induced hypertension   Mild preeclampsia  Preclampsia with out severve features undelivered                                                                       Hospital course:     Pt is a 30 yo female G3P1101 at 36.5 IUP who was admitted on 11/11/2015 for steroids and serial Bps.  Pt completed her steroids and BP are stable.  Pt was noted to have 1092 protein in a 24 hour urine.  All other labs were negative. Pt denies headache or blurred vision. Pt is discharged on 11/13/2015 stable and will come back for induction of labor on 11/14/2015.   Physical Exam:   Vitals:   11/12/15 2000 11/12/15 2151 11/13/15 0012 11/13/15 0553  BP:  132/75 136/77 133/84  Pulse:  (!) 107 (!) 107 99  Resp:  18 20 16   Temp: 97.5 F (36.4 C) 97.5 F (36.4 C) 98.5 F (36.9 C) 98.4 F (36.9 C)  TempSrc: Oral Oral Oral Oral  SpO2:  99% 100% 99%  Weight:    122.9 kg (271 lb 0.4 oz)  Height:       General: alert, cooperative and no distress  Heart RRR with out murmur Lungs clear Abd soft FH appropriate for gestational age.   FHT Cat 1 strip Legs slight edema, DTRs +2/+2   Labs:  CBC Latest Ref Rng & Units 11/12/2015 06/17/2015 06/13/2015  WBC 4.0 - 10.5 K/uL 12.5(H) 12.1(H) 11.4(H)  Hemoglobin 12.0 - 15.0 g/dL 8.9(L) 9.7(L) 10.1(L)  Hematocrit 36.0 - 46.0 % 28.0(L) 29.5(L) 30.3(L)  Platelets 150 - 400 K/uL 346 396 339     Discharge instruction:Reviewed signs and symptoms of preeclampsia..  After Visit Meds:    Medication List    STOP taking  these medications   cyclobenzaprine 10 MG tablet Commonly known as:  FLEXERIL   ondansetron 8 MG disintegrating tablet Commonly known as:  ZOFRAN-ODT     TAKE these medications   pantoprazole 40 MG tablet Commonly known as:  PROTONIX Take 40 mg by mouth at bedtime.   PRENATAL GUMMIES/DHA & FA 0.4-32.5 MG Chew Chew 2 each by mouth at bedtime.       Diet: routine diet  Activity:Modified bed rest.  Outpatient follow up:Induction on 11/14/2015 Follow up Appt:No future appointments. Follow up visit: No Follow-up on file.   11/13/2015 Starla Link, CNM

## 2015-11-15 ENCOUNTER — Encounter (HOSPITAL_COMMUNITY): Payer: Self-pay | Admitting: Anesthesiology

## 2015-11-15 ENCOUNTER — Inpatient Hospital Stay (HOSPITAL_COMMUNITY): Payer: BC Managed Care – PPO | Admitting: Anesthesiology

## 2015-11-15 ENCOUNTER — Encounter (HOSPITAL_COMMUNITY): Payer: Self-pay | Admitting: *Deleted

## 2015-11-15 ENCOUNTER — Inpatient Hospital Stay (HOSPITAL_COMMUNITY)
Admission: AD | Admit: 2015-11-15 | Discharge: 2015-11-17 | DRG: 775 | Disposition: A | Discharge status: Home / Self Care | Payer: BC Managed Care – PPO | Source: Ambulatory Visit | Attending: Obstetrics and Gynecology | Admitting: Obstetrics and Gynecology

## 2015-11-15 DIAGNOSIS — Z3A37 37 weeks gestation of pregnancy: Secondary | ICD-10-CM

## 2015-11-15 DIAGNOSIS — O1490 Unspecified pre-eclampsia, unspecified trimester: Secondary | ICD-10-CM

## 2015-11-15 DIAGNOSIS — O9962 Diseases of the digestive system complicating childbirth: Secondary | ICD-10-CM | POA: Diagnosis present

## 2015-11-15 DIAGNOSIS — Z8249 Family history of ischemic heart disease and other diseases of the circulatory system: Secondary | ICD-10-CM

## 2015-11-15 DIAGNOSIS — O99214 Obesity complicating childbirth: Secondary | ICD-10-CM | POA: Diagnosis present

## 2015-11-15 DIAGNOSIS — K219 Gastro-esophageal reflux disease without esophagitis: Secondary | ICD-10-CM | POA: Diagnosis present

## 2015-11-15 DIAGNOSIS — D649 Anemia, unspecified: Secondary | ICD-10-CM | POA: Diagnosis present

## 2015-11-15 DIAGNOSIS — O26899 Other specified pregnancy related conditions, unspecified trimester: Secondary | ICD-10-CM

## 2015-11-15 DIAGNOSIS — O9902 Anemia complicating childbirth: Secondary | ICD-10-CM | POA: Diagnosis present

## 2015-11-15 DIAGNOSIS — Z6791 Unspecified blood type, Rh negative: Secondary | ICD-10-CM | POA: Diagnosis not present

## 2015-11-15 DIAGNOSIS — O1494 Unspecified pre-eclampsia, complicating childbirth: Secondary | ICD-10-CM | POA: Diagnosis present

## 2015-11-15 DIAGNOSIS — O1493 Unspecified pre-eclampsia, third trimester: Secondary | ICD-10-CM

## 2015-11-15 DIAGNOSIS — O1404 Mild to moderate pre-eclampsia, complicating childbirth: Secondary | ICD-10-CM | POA: Diagnosis present

## 2015-11-15 DIAGNOSIS — O26893 Other specified pregnancy related conditions, third trimester: Secondary | ICD-10-CM | POA: Diagnosis present

## 2015-11-15 DIAGNOSIS — O99824 Streptococcus B carrier state complicating childbirth: Secondary | ICD-10-CM | POA: Diagnosis present

## 2015-11-15 DIAGNOSIS — Z6841 Body Mass Index (BMI) 40.0 and over, adult: Secondary | ICD-10-CM | POA: Diagnosis not present

## 2015-11-15 DIAGNOSIS — O1400 Mild to moderate pre-eclampsia, unspecified trimester: Secondary | ICD-10-CM | POA: Diagnosis present

## 2015-11-15 HISTORY — DX: Unspecified abnormal cytological findings in specimens from vagina: R87.629

## 2015-11-15 HISTORY — DX: Unspecified pre-eclampsia, third trimester: O14.93

## 2015-11-15 HISTORY — DX: Unspecified pre-eclampsia, unspecified trimester: O14.90

## 2015-11-15 LAB — CBC
HCT: 28.2 % — ABNORMAL LOW (ref 36.0–46.0)
HEMATOCRIT: 30 % — AB (ref 36.0–46.0)
HEMOGLOBIN: 9 g/dL — AB (ref 12.0–15.0)
Hemoglobin: 9.5 g/dL — ABNORMAL LOW (ref 12.0–15.0)
MCH: 21.3 pg — AB (ref 26.0–34.0)
MCH: 21.4 pg — AB (ref 26.0–34.0)
MCHC: 31.9 g/dL (ref 30.0–36.0)
MCHC: 31.7 g/dL (ref 30.0–36.0)
MCV: 66.7 fL — AB (ref 78.0–100.0)
MCV: 67.6 fL — AB (ref 78.0–100.0)
Platelets: 376 10*3/uL (ref 150–400)
Platelets: 409 10*3/uL — ABNORMAL HIGH (ref 150–400)
RBC: 4.23 MIL/uL (ref 3.87–5.11)
RBC: 4.44 MIL/uL (ref 3.87–5.11)
RDW: 18.3 % — ABNORMAL HIGH (ref 11.5–15.5)
RDW: 18.3 % — AB (ref 11.5–15.5)
WBC: 11.9 10*3/uL — ABNORMAL HIGH (ref 4.0–10.5)
WBC: 11.9 10*3/uL — ABNORMAL HIGH (ref 4.0–10.5)

## 2015-11-15 LAB — URIC ACID: URIC ACID, SERUM: 5.5 mg/dL (ref 2.3–6.6)

## 2015-11-15 LAB — COMPREHENSIVE METABOLIC PANEL WITH GFR
ALT: 18 U/L (ref 14–54)
AST: 22 U/L (ref 15–41)
Albumin: 2.7 g/dL — ABNORMAL LOW (ref 3.5–5.0)
Alkaline Phosphatase: 151 U/L — ABNORMAL HIGH (ref 38–126)
Anion gap: 10 (ref 5–15)
BUN: 8 mg/dL (ref 6–20)
CO2: 22 mmol/L (ref 22–32)
Calcium: 9.1 mg/dL (ref 8.9–10.3)
Chloride: 105 mmol/L (ref 101–111)
Creatinine, Ser: 0.51 mg/dL (ref 0.44–1.00)
GFR calc Af Amer: >60 mL/min
GFR calc non Af Amer: >60 mL/min
Glucose, Bld: 104 mg/dL — ABNORMAL HIGH (ref 65–99)
Potassium: 3.7 mmol/L (ref 3.5–5.1)
Sodium: 137 mmol/L (ref 135–145)
Total Bilirubin: 0.4 mg/dL (ref 0.3–1.2)
Total Protein: 6.5 g/dL (ref 6.5–8.1)

## 2015-11-15 LAB — OB RESULTS CONSOLE GBS: STREP GROUP B AG: POSITIVE

## 2015-11-15 LAB — TYPE AND SCREEN
ABO/RH(D): O NEG
Antibody Screen: POSITIVE
DAT, IgG: NEGATIVE
UNIT DIVISION: 0
UNIT DIVISION: 0

## 2015-11-15 LAB — LACTATE DEHYDROGENASE: LDH: 147 U/L (ref 98–192)

## 2015-11-15 LAB — RPR: RPR: NONREACTIVE

## 2015-11-15 MED ORDER — PRENATAL MULTIVITAMIN CH
1.0000 | ORAL_TABLET | Freq: Every day | ORAL | Status: DC
  Administered 2015-11-16: 1 via ORAL
  Filled 2015-11-15: qty 1

## 2015-11-15 MED ORDER — OXYCODONE-ACETAMINOPHEN 5-325 MG PO TABS: 1.0000 | ORAL_TABLET | ORAL | Status: DC | PRN

## 2015-11-15 MED ORDER — LACTATED RINGERS IV SOLN
INTRAVENOUS | Status: DC
  Administered 2015-11-16: 08:00:00 via INTRAVENOUS

## 2015-11-15 MED ORDER — TETANUS-DIPHTH-ACELL PERTUSSIS 5-2.5-18.5 LF-MCG/0.5 IM SUSP
0.5000 mL | Freq: Once | INTRAMUSCULAR | Status: DC
Start: 2015-11-16 — End: 2015-11-17

## 2015-11-15 MED ORDER — LACTATED RINGERS IV SOLN: 500.0000 mL | INTRAVENOUS | Status: DC | PRN

## 2015-11-15 MED ORDER — PHENYLEPHRINE 40 MCG/ML (10ML) SYRINGE FOR IV PUSH (FOR BLOOD PRESSURE SUPPORT): 80.0000 ug | PREFILLED_SYRINGE | INTRAVENOUS | Status: DC | PRN

## 2015-11-15 MED ORDER — OXYTOCIN BOLUS FROM INFUSION
500.0000 mL | Freq: Once | INTRAVENOUS | Status: AC
  Administered 2015-11-15: 500 mL via INTRAVENOUS

## 2015-11-15 MED ORDER — DIPHENHYDRAMINE HCL 25 MG PO CAPS: 25.0000 mg | ORAL_CAPSULE | Freq: Four times a day (QID) | ORAL | Status: DC | PRN

## 2015-11-15 MED ORDER — ONDANSETRON HCL 4 MG/2ML IJ SOLN: 4.0000 mg | INTRAMUSCULAR | Status: DC | PRN

## 2015-11-15 MED ORDER — ACETAMINOPHEN 325 MG PO TABS: 650.0000 mg | ORAL_TABLET | ORAL | Status: DC | PRN

## 2015-11-15 MED ORDER — ONDANSETRON HCL 4 MG/2ML IJ SOLN: 4.0000 mg | Freq: Four times a day (QID) | INTRAMUSCULAR | Status: DC | PRN

## 2015-11-15 MED ORDER — LABETALOL HCL 5 MG/ML IV SOLN: 20.0000 mg | INTRAVENOUS | Status: DC | PRN

## 2015-11-15 MED ORDER — IBUPROFEN 600 MG PO TABS
600.0000 mg | ORAL_TABLET | Freq: Four times a day (QID) | ORAL | Status: DC
  Administered 2015-11-15 – 2015-11-17 (×7): 600 mg via ORAL
  Filled 2015-11-15 (×7): qty 1

## 2015-11-15 MED ORDER — COCONUT OIL OIL
1.0000 "application " | TOPICAL_OIL | Status: DC | PRN
  Filled 2015-11-15: qty 120

## 2015-11-15 MED ORDER — LACTATED RINGERS IV SOLN
INTRAVENOUS | Status: DC
  Administered 2015-11-15: 16:00:00 via INTRAVENOUS

## 2015-11-15 MED ORDER — FLEET ENEMA 7-19 GM/118ML RE ENEM: 1.0000 | ENEMA | RECTAL | Status: DC | PRN

## 2015-11-15 MED ORDER — SIMETHICONE 80 MG PO CHEW: 80.0000 mg | CHEWABLE_TABLET | ORAL | Status: DC | PRN

## 2015-11-15 MED ORDER — ZOLPIDEM TARTRATE 5 MG PO TABS: 5.0000 mg | ORAL_TABLET | Freq: Every evening | ORAL | Status: DC | PRN

## 2015-11-15 MED ORDER — MISOPROSTOL 25 MCG QUARTER TABLET
25.0000 ug | ORAL_TABLET | ORAL | Status: DC | PRN
  Administered 2015-11-15 (×2): 25 ug via VAGINAL
  Filled 2015-11-15: qty 0.25
  Filled 2015-11-15: qty 1

## 2015-11-15 MED ORDER — LACTATED RINGERS IV SOLN
INTRAVENOUS | Status: DC
  Administered 2015-11-15: 09:00:00 via INTRAVENOUS

## 2015-11-15 MED ORDER — LIDOCAINE HCL (PF) 1 % IJ SOLN
30.0000 mL | INTRAMUSCULAR | Status: DC | PRN
  Filled 2015-11-15: qty 30

## 2015-11-15 MED ORDER — TETANUS-DIPHTH-ACELL PERTUSSIS 5-2.5-18.5 LF-MCG/0.5 IM SUSP: 0.5000 mL | Freq: Once | INTRAMUSCULAR | Status: DC

## 2015-11-15 MED ORDER — PENICILLIN G POTASSIUM 5000000 UNITS IJ SOLR
2.5000 10*6.[IU] | INTRAVENOUS | Status: DC
  Filled 2015-11-15 (×6): qty 2.5

## 2015-11-15 MED ORDER — MAGNESIUM SULFATE 50 % IJ SOLN
2.0000 g/h | INTRAVENOUS | Status: DC
  Administered 2015-11-15: 2 g/h via INTRAVENOUS
  Filled 2015-11-15: qty 80

## 2015-11-15 MED ORDER — PENICILLIN G POTASSIUM 5000000 UNITS IJ SOLR
5.0000 10*6.[IU] | Freq: Once | INTRAVENOUS | Status: AC
  Administered 2015-11-15: 5 10*6.[IU] via INTRAVENOUS
  Filled 2015-11-15: qty 5

## 2015-11-15 MED ORDER — BENZOCAINE-MENTHOL 20-0.5 % EX AERO
1.0000 "application " | INHALATION_SPRAY | CUTANEOUS | Status: DC | PRN
  Filled 2015-11-15: qty 56

## 2015-11-15 MED ORDER — MEDROXYPROGESTERONE ACETATE 150 MG/ML IM SUSP: 150.0000 mg | INTRAMUSCULAR | Status: DC | PRN

## 2015-11-15 MED ORDER — SOD CITRATE-CITRIC ACID 500-334 MG/5ML PO SOLN: 30.0000 mL | ORAL | Status: DC | PRN

## 2015-11-15 MED ORDER — EPHEDRINE 5 MG/ML INJ
10.0000 mg | INTRAVENOUS | Status: DC | PRN
  Filled 2015-11-15: qty 4

## 2015-11-15 MED ORDER — PHENYLEPHRINE 40 MCG/ML (10ML) SYRINGE FOR IV PUSH (FOR BLOOD PRESSURE SUPPORT)
80.0000 ug | PREFILLED_SYRINGE | INTRAVENOUS | Status: DC | PRN
  Filled 2015-11-15: qty 10
  Filled 2015-11-15: qty 5

## 2015-11-15 MED ORDER — LIDOCAINE HCL (PF) 1 % IJ SOLN
INTRAMUSCULAR | Status: DC | PRN
  Administered 2015-11-15: 4 mL via EPIDURAL

## 2015-11-15 MED ORDER — OXYCODONE-ACETAMINOPHEN 5-325 MG PO TABS: 2.0000 | ORAL_TABLET | ORAL | Status: DC | PRN

## 2015-11-15 MED ORDER — MISOPROSTOL 25 MCG QUARTER TABLET
25.0000 ug | ORAL_TABLET | Freq: Once | ORAL | Status: DC
  Filled 2015-11-15: qty 0.25

## 2015-11-15 MED ORDER — HYDRALAZINE HCL 20 MG/ML IJ SOLN
10.0000 mg | Freq: Once | INTRAMUSCULAR | Status: DC | PRN
Start: 2015-11-15 — End: 2015-11-17

## 2015-11-15 MED ORDER — WITCH HAZEL-GLYCERIN EX PADS: 1.0000 "application " | MEDICATED_PAD | CUTANEOUS | Status: DC | PRN

## 2015-11-15 MED ORDER — DIBUCAINE 1 % RE OINT: 1.0000 "application " | TOPICAL_OINTMENT | RECTAL | Status: DC | PRN

## 2015-11-15 MED ORDER — BUTORPHANOL TARTRATE 1 MG/ML IJ SOLN: 2.0000 mg | INTRAMUSCULAR | Status: DC | PRN

## 2015-11-15 MED ORDER — OXYTOCIN BOLUS FROM INFUSION: 500.0000 mL | Freq: Once | INTRAVENOUS | Status: DC

## 2015-11-15 MED ORDER — LACTATED RINGERS IV SOLN
500.0000 mL | Freq: Once | INTRAVENOUS | Status: AC
  Administered 2015-11-15: 500 mL via INTRAVENOUS

## 2015-11-15 MED ORDER — MEASLES, MUMPS & RUBELLA VAC ~~LOC~~ INJ
0.5000 mL | INJECTION | Freq: Once | SUBCUTANEOUS | Status: DC
  Filled 2015-11-15: qty 0.5

## 2015-11-15 MED ORDER — MAGNESIUM SULFATE BOLUS VIA INFUSION
4.0000 g | Freq: Once | INTRAVENOUS | Status: AC
  Administered 2015-11-15: 4 g via INTRAVENOUS
  Filled 2015-11-15: qty 500

## 2015-11-15 MED ORDER — EPHEDRINE 5 MG/ML INJ: 10.0000 mg | INTRAVENOUS | Status: DC | PRN

## 2015-11-15 MED ORDER — FENTANYL 2.5 MCG/ML BUPIVACAINE 1/10 % EPIDURAL INFUSION (WH - ANES)
14.0000 mL/h | INTRAMUSCULAR | Status: DC | PRN
  Administered 2015-11-15: 14 mL/h via EPIDURAL
  Filled 2015-11-15: qty 125

## 2015-11-15 MED ORDER — TERBUTALINE SULFATE 1 MG/ML IJ SOLN: 0.2500 mg | Freq: Once | INTRAMUSCULAR | Status: DC | PRN

## 2015-11-15 MED ORDER — HYDROMORPHONE HCL 2 MG PO TABS: 2.0000 mg | ORAL_TABLET | ORAL | Status: DC | PRN

## 2015-11-15 MED ORDER — PHENYLEPHRINE 40 MCG/ML (10ML) SYRINGE FOR IV PUSH (FOR BLOOD PRESSURE SUPPORT)
80.0000 ug | PREFILLED_SYRINGE | INTRAVENOUS | Status: DC | PRN
  Filled 2015-11-15: qty 5

## 2015-11-15 MED ORDER — OXYTOCIN 40 UNITS IN LACTATED RINGERS INFUSION - SIMPLE MED
2.5000 [IU]/h | INTRAVENOUS | Status: DC
  Administered 2015-11-15: 2.5 [IU]/h via INTRAVENOUS

## 2015-11-15 MED ORDER — OXYTOCIN 40 UNITS IN LACTATED RINGERS INFUSION - SIMPLE MED: 2.5000 [IU]/h | INTRAVENOUS | Status: DC

## 2015-11-15 MED ORDER — LACTATED RINGERS IV SOLN
INTRAVENOUS | Status: DC
  Administered 2015-11-15: 20:00:00 via INTRAVENOUS

## 2015-11-15 MED ORDER — LACTATED RINGERS IV SOLN: 500.0000 mL | Freq: Once | INTRAVENOUS | Status: DC

## 2015-11-15 MED ORDER — MAGNESIUM SULFATE 50 % IJ SOLN
2.0000 g/h | INTRAVENOUS | Status: DC
  Administered 2015-11-16: 2 g/h via INTRAVENOUS
  Filled 2015-11-15 (×2): qty 80

## 2015-11-15 MED ORDER — OXYTOCIN 40 UNITS IN LACTATED RINGERS INFUSION - SIMPLE MED
1.0000 m[IU]/min | INTRAVENOUS | Status: DC
  Administered 2015-11-15: 1 m[IU]/min via INTRAVENOUS
  Filled 2015-11-15: qty 1000

## 2015-11-15 MED ORDER — FENTANYL CITRATE (PF) 100 MCG/2ML IJ SOLN: 50.0000 ug | INTRAMUSCULAR | Status: DC | PRN

## 2015-11-15 MED ORDER — DIPHENHYDRAMINE HCL 50 MG/ML IJ SOLN: 12.5000 mg | INTRAMUSCULAR | Status: DC | PRN

## 2015-11-15 MED ORDER — SENNOSIDES-DOCUSATE SODIUM 8.6-50 MG PO TABS
2.0000 | ORAL_TABLET | ORAL | Status: DC
  Administered 2015-11-15 – 2015-11-17 (×2): 2 via ORAL
  Filled 2015-11-15 (×2): qty 2

## 2015-11-15 MED ORDER — ONDANSETRON HCL 4 MG PO TABS: 4.0000 mg | ORAL_TABLET | ORAL | Status: DC | PRN

## 2015-11-15 NOTE — Anesthesia Procedure Notes (Signed)
Epidural Patient location during procedure: OB Start time: 11/15/2015 4:02 PM End time: 11/15/2015 4:09 PM  Staffing Anesthesiologist: Suella Broad D Performed: anesthesiologist   Preanesthetic Checklist Completed: patient identified, site marked, surgical consent, pre-op evaluation, timeout performed, IV checked, risks and benefits discussed and monitors and equipment checked  Epidural Patient position: sitting Prep: ChloraPrep Patient monitoring: heart rate, continuous pulse ox and blood pressure Approach: midline Location: L3-L4 Injection technique: LOR saline  Needle:  Needle type: Tuohy  Needle gauge: 17 G Needle length: 9 cm Catheter type: closed end flexible Catheter size: 20 Guage Test dose: negative and 1.5% lidocaine  Assessment Events: blood not aspirated, injection not painful, no injection resistance and no paresthesia  Additional Notes LOR @ 6.5  Patient identified. Risks/Benefits/Options discussed with patient including but not limited to bleeding, infection, nerve damage, paralysis, failed block, incomplete pain control, headache, blood pressure changes, nausea, vomiting, reactions to medications, itching and postpartum back pain. Confirmed with bedside nurse the patient's most recent platelet count. Confirmed with patient that they are not currently taking any anticoagulation, have any bleeding history or any family history of bleeding disorders. Patient expressed understanding and wished to proceed. All questions were answered. Sterile technique was used throughout the entire procedure. Please see nursing notes for vital signs. Test dose was given through epidural catheter and negative prior to continuing to dose epidural or start infusion. Warning signs of high block given to the patient including shortness of breath, tingling/numbness in hands, complete motor block, or any concerning symptoms with instructions to call for help. Patient was given instructions on  fall risk and not to get out of bed. All questions and concerns addressed with instructions to call with any issues or inadequate analgesia.    Reason for block:procedure for pain

## 2015-11-15 NOTE — Progress Notes (Signed)
Evelyn Marshall is a 30 y.o. G3P1101 at [redacted]w[redacted]d admitted for induction of labor due to Pre-eclamptic toxemia of pregnancy..  Subjective:  Contractions are getting stronger. No HA or blurred vision.  Objective:  BP (!) 152/90   Pulse 97   Temp 97.9 F (36.6 C) (Oral)   Resp 20   Ht 5\' 4"  (1.626 m)   Wt 271 lb (122.9 kg)   LMP 03/01/2015 (Exact Date)   BMI 46.52 kg/m  No intake/output data recorded. No intake/output data recorded.  FHT:  Cat: 1 UC:   regular, every 4 minutes SVE:   Dilation: 2.5 Effacement (%): 50 Station: -2 Exam by:: Luanna Cole, RN Exam is stable.  Labs:  Lab Results  Component Value Date   WBC 11.9 (H) 11/15/2015   HGB 9.0 (L) 11/15/2015   HCT 28.2 (L) 11/15/2015   MCV 66.7 (L) 11/15/2015   PLT 376 11/15/2015    Assessment / Plan:  PreEclampsia without severe features  Labor: Continue induction Anticipated MOD:  NSVD  Aubre Quincy V 11/15/2015, 10:26 AM

## 2015-11-15 NOTE — Anesthesia Pain Management Evaluation Note (Signed)
  CRNA Pain Management Visit Note  Patient: Evelyn Marshall, 30 y.o., female  "Hello I am a member of the anesthesia team at [facility]. We have an anesthesia team available at all times to provide care throughout the hospital, including epidural management and anesthesia for C-section. I don't know your plan for the delivery whether it a natural birth, water birth, IV sedation, nitrous supplementation, doula or epidural, but we want to meet your pain goals."   1.Was your pain managed to your expectations on prior hospitalizations?   Unable to assess - patient sleeping  2.What is your expectation for pain management during this hospitalization?     Labor support without medications and Epidural  3.How can we help you reach that goal? Natural initially  Record the patient's initial score and the patient's pain goal.   Pain: Patient sleeping - unable to assess  Pain Goal: Patient sleeping - unable to assess [facility] wants you to be able to say your pain was always managed very well.  [name] 11/15/2015

## 2015-11-15 NOTE — Progress Notes (Signed)
Evelyn Marshall is a 30 y.o. G3P1101 at [redacted]w[redacted]d admitted for induction of labor due to Pre-eclamptic toxemia of pregnancy.  Subjective:  No headaches or blurred vision. Contractions are getting stronger.  Objective:  BP 129/81   Pulse 89   Temp 97.8 F (36.6 C) (Oral)   Resp 20   Ht 5\' 4"  (1.626 m)   Wt 271 lb (122.9 kg)   LMP 03/01/2015 (Exact Date)   BMI 46.52 kg/m  No intake/output data recorded. No intake/output data recorded.  FHT:  Cat: 1 UC:   regular, every 4 minutes SVE:   Dilation: 4 Effacement (%): 60 Station: -2 Exam by:: Dr Raphael Gibney  Membranes ruptured. Fluid was clear. Intrauterine pressure catheter placed.  Labs:  Lab Results  Component Value Date   WBC 11.9 (H) 11/15/2015   HGB 9.0 (L) 11/15/2015   HCT 28.2 (L) 11/15/2015   MCV 66.7 (L) 11/15/2015   PLT 376 11/15/2015    Assessment / Plan:  Induction of labor due to preeclampsia,  progressing well on pitocin   The patient is on penicillin because of beta strep colonization.  Begin magnesium.  Labor: Early labor. Anticipated MOD:  NSVD  Evelyn Marshall 11/15/2015, 3:02 PM

## 2015-11-15 NOTE — Anesthesia Preprocedure Evaluation (Signed)
Anesthesia Evaluation  Patient identified by MRN, date of birth, ID band Patient awake    Reviewed: Allergy & Precautions, Patient's Chart, lab work & pertinent test results  Airway Mallampati: III       Dental   Pulmonary neg pulmonary ROS,    breath sounds clear to auscultation       Cardiovascular hypertension,  Rhythm:Regular Rate:Normal     Neuro/Psych negative neurological ROS  negative psych ROS   GI/Hepatic Neg liver ROS, GERD  Medicated,  Endo/Other  negative endocrine ROS  Renal/GU negative Renal ROS  negative genitourinary   Musculoskeletal negative musculoskeletal ROS (+)   Abdominal   Peds negative pediatric ROS (+)  Hematology negative hematology ROS (+)   Anesthesia Other Findings   Reproductive/Obstetrics (+) Pregnancy                             Lab Results  Component Value Date   WBC 11.9 (H) 11/15/2015   HGB 9.5 (L) 11/15/2015   HCT 30.0 (L) 11/15/2015   MCV 67.6 (L) 11/15/2015   PLT 409 (H) 11/15/2015   No results found for: INR, PROTIME   Anesthesia Physical Anesthesia Plan  ASA: III  Anesthesia Plan: Epidural   Post-op Pain Management:    Induction:   Airway Management Planned:   Additional Equipment:   Intra-op Plan:   Post-operative Plan:   Informed Consent: I have reviewed the patients History and Physical, chart, labs and discussed the procedure including the risks, benefits and alternatives for the proposed anesthesia with the patient or authorized representative who has indicated his/her understanding and acceptance.     Plan Discussed with:   Anesthesia Plan Comments:         Anesthesia Quick Evaluation

## 2015-11-15 NOTE — H&P (Signed)
Evelyn Marshall is a 30 y.o. female, G3P1011 at 62 weeks, presenting for induction or labor for preeclampsia without severe features.  She has been followed at the [facility] and Gynecology division of [facility].  Her pregnancy has been complicated by obesity, a past history of preeclampsia with her first pregnancy, and hypertension during third trimester of this pregnancy. She was seen in the office on 11/10/2015 and was noted to have proteinuria. [place] labs were normal except for protein to creatinine ratio that was elevated. The patient denies headaches, blurred vision, and right upper quadrant tenderness. An ultrasound 11/09/2015. showed the baby was in a vertex presentation. The fluid level was normal. Her biophysical profile was 8 out of 8. See history below. She was admitted to hospital for observation of Bps and steroids on 10/19-10/21.    Patient Active Problem List   Diagnosis Date Noted  . Mild preeclampsia 11/12/2015    Priority: High  . Morbid obesity with BMI of 40.0-44.9, adult ([place]) 05/18/2015    Priority: High  . Rh negative, maternal 05/18/2015    Priority: Low  . Preeclampsia, third trimester 11/15/2015  . Hypokalemia, inadequate intake 06/17/2015  . Ptyalism 05/18/2015  . Chronic hypertension in pregnancy 05/18/2015    History of present pregnancy: Patient entered care at 8 weeks.   EDC of 12/06/2015 was established by LMP.   Anatomy scan: 21.5  weeks, with normal findings and an anterior placenta.   Additional [place] evaluations:  Monthly growth scans and weekly BPP.  Last [place] was on 11/10/2015..   Significant prenatal events:    Obesity, RhD negative,ASCUS pap,  Last evaluation:  11/13/2015  OB History    Gravida Para Term Preterm AB Living   3 2 1 1  0 1   SAB TAB Ectopic Multiple Live Births   0       2     Past Medical History:  Diagnosis Date  . Abnormal Pap smear 2012   colpo result benign  . Pregnancy induced hypertension     Past Surgical History:  Procedure Laterality Date  . WISDOM TOOTH EXTRACTION     Family History: family history includes Heart disease in her father; Hypertension in her father. Social History:  reports that she has never smoked. She has never used smokeless tobacco. She reports that she does not drink alcohol or use drugs.   Prenatal Transfer Tool  Maternal Diabetes: No Genetic Screening: Normal Maternal Ultrasounds/Referrals: Normal Fetal Ultrasounds or other Referrals:  None Maternal Substance Abuse:  No Significant Maternal Medications:  None Significant Maternal Lab Results: Lab values include: Group B Strep positive Rh negative, proteinuria  TDAP yes Flu no  ROS:  Normal pregnancy complaints.   No Known Allergies     Temperature 98.5 F (36.9 C), temperature source Oral, resp. rate 20, height 5\' 4"  (1.626 m), weight 122.9 kg (271 lb), last menstrual period 03/01/2015.  Chest clear Heart RRR without murmur Abd gravid, NT, FH appropriate Pelvic: 2/50/-2 Ext:Neg edema +2/+2 reflexes  FHR: Category 1 UCs:  One in 10 minutes  Prenatal labs: ABO, Rh: --/--/O NEG (10/19 1640) Antibody: POS (10/19 1640) Rubella:Immune  RPR: Nonreactive (04/03 0000)  HBsAg: Negative (04/03 0000)  HIV: Non-reactive (04/03 0000)  GBS:  positive Sickle cell/Hgb electrophoresis:  Neg Pap: ASCUS positive high risk HPV GC:  Neg Chlamydia:  Neg Genetic screenings:  Neg Glucola:  96 Other:   Hgb 11.1  at NOB, 8.8 at 28 weeks  Assessment/Plan: IUP at 37 weeks with preeclampsia without severe features Obesity Rh negative Anemia GBS positive Cat 1 strip  Plan: Admit to [place] per consult with Alesia Richards Routine [facility] orders cytotec Pain med/epidural prn PCN G for GBS prophylaxis   Pleas Koch ProtheroCNM, MSN 11/15/2015, 1:09 AM

## 2015-11-16 LAB — COMPREHENSIVE METABOLIC PANEL
ALBUMIN: 2.4 g/dL — AB (ref 3.5–5.0)
ALK PHOS: 130 U/L — AB (ref 38–126)
ALT: 15 U/L (ref 14–54)
ANION GAP: 7 (ref 5–15)
AST: 17 U/L (ref 15–41)
BILIRUBIN TOTAL: 0.4 mg/dL (ref 0.3–1.2)
BUN: 8 mg/dL (ref 6–20)
CALCIUM: 7.8 mg/dL — AB (ref 8.9–10.3)
CO2: 23 mmol/L (ref 22–32)
Chloride: 105 mmol/L (ref 101–111)
Creatinine, Ser: 0.49 mg/dL (ref 0.44–1.00)
GFR calc Af Amer: >60 mL/min (ref >60)
GFR calc non Af Amer: >60 mL/min (ref >60)
GLUCOSE: 86 mg/dL (ref 65–99)
POTASSIUM: 3.7 mmol/L (ref 3.5–5.1)
SODIUM: 135 mmol/L (ref 135–145)
TOTAL PROTEIN: 6.2 g/dL — AB (ref 6.5–8.1)

## 2015-11-16 LAB — CBC
HEMATOCRIT: 26.8 % — AB (ref 36.0–46.0)
HEMOGLOBIN: 8.6 g/dL — AB (ref 12.0–15.0)
MCH: 21.5 pg — AB (ref 26.0–34.0)
MCHC: 32.1 g/dL (ref 30.0–36.0)
MCV: 67 fL — AB (ref 78.0–100.0)
Platelets: 402 10*3/uL — ABNORMAL HIGH (ref 150–400)
RBC: 4 MIL/uL (ref 3.87–5.11)
RDW: 18.3 % — AB (ref 11.5–15.5)
WBC: 14.9 10*3/uL — ABNORMAL HIGH (ref 4.0–10.5)

## 2015-11-16 LAB — MAGNESIUM: Magnesium: 3.8 mg/dL — ABNORMAL HIGH (ref 1.7–2.4)

## 2015-11-16 NOTE — Anesthesia Postprocedure Evaluation (Signed)
Anesthesia Post Note  Patient: Evelyn Marshall  Procedure(s) Performed: * No procedures listed *  Patient location during evaluation: [facility] Anesthesia Type: Epidural Level of consciousness: awake, awake and alert, oriented and patient cooperative Pain management: pain level controlled Vital Signs Assessment: post-procedure vital signs reviewed and stable Respiratory status: spontaneous breathing, nonlabored ventilation and respiratory function stable Cardiovascular status: stable Postop Assessment: patient able to bend at knees, no headache, no signs of nausea or vomiting and no backache Anesthetic complications: no     Last Vitals:  Vitals:   11/16/15 0200 11/16/15 0528  BP: 116/63 (!) 141/80  Pulse: (!) 101 89  Resp: 16 16  Temp: 36.9 C 37 C    Last Pain:  Vitals:   11/16/15 0528  TempSrc: Oral  PainSc:    Pain Goal: Patients Stated Pain Goal: 3 (11/15/15 1914)               Nil Xiong L

## 2015-11-16 NOTE — Progress Notes (Signed)
Post Partum Day 1.  She denies HA, blurred vision or RUQ pain Subjective: no complaints, up ad lib and tolerating PO  Objective: Blood pressure 135/78, pulse 99, temperature 98.5 F (36.9 C), temperature source Oral, resp. rate 18, height 5\' 4"  (1.626 m), weight 268 lb (121.6 kg), last menstrual period 03/01/2015, SpO2 99 %, unknown if currently breastfeeding.  Physical Exam:  General: alert and cooperative Lochia: appropriate Uterine Fundus: firm Incision: na DVT Evaluation: No evidence of DVT seen on physical exam. CV RRR Lungs CTA B  Recent Labs  11/15/15 1531 11/16/15 0525  HGB 9.5* 8.6*  HCT 30.0* 26.8*    Assessment/Plan: Breastfeeding and Circumcision prior to discharge  Continue magnesium until 4:30.  Pt is stable with adequate urine output   LOS: 1 day   Evelyn Marshall A 11/16/2015, 12:30 PM

## 2015-11-16 NOTE — Discharge Summary (Signed)
OB Discharge Summary     Patient Name: Evelyn Marshall DOB: 06/14/1985 MRN: [id number]  Date of admission: 11/15/2015 Delivering MD: Ena Dawley   Date of discharge: 11/16/2015  Admitting diagnosis: INDUCTION Intrauterine pregnancy: [redacted]w[redacted]d     Secondary diagnosis:  Active Problems:   Morbid obesity with BMI of 40.0-44.9, adult (HCC)   Mild preeclampsia   Rh negative, maternal   Preeclampsia, third trimester   Pre-eclampsia during pregnancy in third trimester, antepartum   Preeclampsia  Additional problems: None     Discharge diagnosis: Term Pregnancy Delivered and Preeclampsia (mild)                                                                                                Post partum procedures:Magnesium Sulfate  Augmentation: AROM, Pitocin and Cytotec  Complications: None  Hospital course:  Induction of Labor With Vaginal Delivery   30 y.o. yo [name] at [redacted]w[redacted]d was admitted to the hospital 11/15/2015 for induction of labor.  Indication for induction: Preeclampsia.  Patient had an uncomplicated labor course as follows: Membrane Rupture Time/Date: 2:53 PM ,11/15/2015   Intrapartum Procedures: Episiotomy: None [1]                                         Lacerations:  2nd degree [3];Perineal [11]  Patient had delivery of a Viable infant.  Information for the patient's newborn:  Evelyn, Marshall [id number]  Delivery Method: Vaginal, Spontaneous Delivery (Filed from Delivery Summary)   11/15/2015  Details of delivery can be found in separate delivery note.  Patient had a routine postpartum course. Patient is discharged home 11/16/15.   Physical exam Vitals:   11/16/15 1015 11/16/15 1300 11/16/15 1823 11/16/15 2131  BP: 135/78 135/69 (!) 144/77 (!) 142/91  Pulse: 99  90 89  Resp: 18 18 16 18   Temp: 98.5 F (36.9 C) 99 F (37.2 C) 98.4 F (36.9 C) 98.6 F (37 C)  TempSrc: Oral Oral Oral Oral  SpO2: 99% 99% 100% 100%  Weight:      Height:        General: alert, cooperative and no distress Lochia: appropriate Uterine Fundus: firm Incision: N/A DVT Evaluation: No evidence of DVT seen on physical exam. Labs: Lab Results  Component Value Date   WBC 14.9 (H) 11/16/2015   HGB 8.6 (L) 11/16/2015   HCT 26.8 (L) 11/16/2015   MCV 67.0 (L) 11/16/2015   PLT 402 (H) 11/16/2015   CMP Latest Ref Rng & Units 11/16/2015  Glucose 65 - 99 mg/dL 86  BUN 6 - 20 mg/dL 8  Creatinine 0.44 - 1.00 mg/dL 0.49  Sodium 135 - 145 mmol/L 135  Potassium 3.5 - 5.1 mmol/L 3.7  Chloride 101 - 111 mmol/L 105  CO2 22 - 32 mmol/L 23  Calcium 8.9 - 10.3 mg/dL 7.8(L)  Total Protein 6.5 - 8.1 g/dL 6.2(L)  Total Bilirubin 0.3 - 1.2 mg/dL 0.4  Alkaline Phos 38 - 126 U/L 130(H)  AST 15 - 41 U/L  17  ALT 14 - 54 U/L 15    Discharge instruction: per After Visit Summary and "Baby and Me Booklet".  After visit meds: PNV, Motrin,   Diet: routine diet  Activity: Advance as tolerated. Pelvic rest for 6 weeks.   Outpatient follow up:6 weeks Follow up Appt:No future appointments. Follow up Visit:No Follow-up on file.  Postpartum contraception: Undecided  Newborn Data: Live born female  Birth Weight: 6 lb 14.8 oz (3140 g) APGAR: 9, 9  Baby Feeding: Breast Disposition:home with mother   11/16/2015 Evelyn Marshall, CNM

## 2015-11-16 NOTE — Lactation Note (Signed)
This note was copied from a baby's chart. Lactation Consultation Note  Patient Name: Evelyn Marshall [id number] Date: 11/16/2015 Reason for consult: Initial assessment;Late preterm infant   Initial consult with Exp BF mom of 65 hour old infant born by SVD at 56 weeks. Maternal history of Preeclampsia and is on MgSO4.   Infant with 6 BF for 10-15 minutes, 2 attempts, 2 voids and 2 stools since birth. Mom reports infant is sleepy with feedings, we discussed awakening techniques.  LPT infant policy handout given and reviewed with parents. Enc mom to awaken Evelyn Marshall every 3 hour for feedings and limit stimulation to infant between feeds.   Infant was awake and alert, he latched easily to right breast in the cross cradle hold. Infant with rhythmic suckles and frequent intermittent swallows. Infnat fed for 10 minutes and self detached. We hand expressed mom and obtained 2 cc colostrum that mom and I spoon fed infant. Infant then fell asleep. We then hand expressed mom and obtained 3 cc colostrum for next feeding.   DEBP set up with instructions for using Initiate setting, set up, assembling, disassembling, and cleaning pump parts. Advised her to supplement infant with spoon with any EBM after BF. Plan to BF, Supplement any EBM, pump for 15 minutes with DEBP and Hand Express discussed with mom and wrote on board. Mom voiced understanding on plan for infant.   BF Resources Handout and [place] Brochure given, informed of IP/OP Services, BF Support Groups and [place] phone #. Mom has a PIS at home for use.    Dr. Charlesetta Garibaldi called to have infant circumcised, called nursery and Dr. Charlesetta Garibaldi and voiced concerns that infant is 37 weeks, 18 hours and not feeding well. She asked me to discussed with mom and mom can decide. Spoke with mom and she prefers to wait another 24 hours. Information relayed to [place], [place] in nursery.   Follow up tomorrow and prn.         Maternal Data Formula Feeding for Exclusion: No Has  patient been taught Hand Expression?: Yes Does the patient have breastfeeding experience prior to this delivery?: Yes  Feeding Feeding Type: Breast Fed Length of feed: 10 min  LATCH Score/Interventions Latch: Grasps breast easily, tongue down, lips flanged, rhythmical sucking. Intervention(s): Skin to skin;Teach feeding cues;Waking techniques  Audible Swallowing: Spontaneous and intermittent Intervention(s): Alternate breast massage;Hand expression;Skin to skin  Type of Nipple: Everted at rest and after stimulation  Comfort (Breast/Nipple): Soft / non-tender     Hold (Positioning): Assistance needed to correctly position infant at breast and maintain latch.  LATCH Score: 9  Lactation Tools Discussed/Used WIC Program: No Pump Review: Setup, frequency, and cleaning;Milk Storage Initiated by:: Nonah Mattes, RN, IBCLC Date initiated:: 11/16/15   Consult Status Consult Status: Follow-up Date: 11/10/15 Follow-up type: In-patient    Debby Freiberg Hice 11/16/2015, 12:20 PM

## 2015-11-17 MED ORDER — RHO D IMMUNE GLOBULIN 1500 UNIT/2ML IJ SOSY
300.0000 ug | PREFILLED_SYRINGE | Freq: Once | INTRAMUSCULAR | Status: AC
Start: 2015-11-17 — End: 2015-11-17
  Administered 2015-11-17: 300 ug via INTRAVENOUS
  Filled 2015-11-17: qty 2

## 2015-11-17 NOTE — Lactation Note (Signed)
This note was copied from a baby's chart. Lactation Consultation Note  Patient Name: Evelyn Marshall [id number] Date: 11/17/2015 Reason for consult: Follow-up assessment    With this mom of an early term baby, now 63 hours old, and 37 2/7 weeks 32, Mom has been cluster feeding the baby through the night, and dad reports the baby having multiple wet and dirty diapers, and stool transitioning to pasty brown. Mom has not had time to pump, due to cluster feeding. I advised her that now her milk will be coming in quickly, and if the baby is not softening her breasts, she should pump to comfort, after breastfeeidng, and supplement the baby with about an ounce at a time of EBM, and give more if baby still showing feeding cues.  I reviewed strorage of EBM with mom and dad also. Engorgement care/breast care reviewed. Mom advised to pump if baby not breastfeeding by 12 noon, today,  and then offer EBm by bottle to baby, since he is sleepy now post circumcision. Mom encouraged to call lactation for any questions/concerns, once home. Baby will have a bil check at 1330, and will go home if this is low enough.    Maternal Data    Feeding Feeding Type: Breast Fed Length of feed: 10 min  LATCH Score/Interventions       Type of Nipple: Everted at rest and after stimulation  Comfort (Breast/Nipple): Soft / non-tender           Lactation Tools Discussed/Used     Consult Status Consult Status: PRN Follow-up type: Call as needed    Tonna Corner 11/17/2015, 11:19 AM

## 2015-11-17 NOTE — Progress Notes (Signed)
Discharge teaching complete. Pt understood all instructions and did not have any questions. Pt ambulated out of the hospital and discharged home to family.  

## 2015-11-18 LAB — RH IG WORKUP (INCLUDES ABO/RH)
ABO/RH(D): O NEG
FETAL SCREEN: NEGATIVE
Gestational Age(Wks): 37
UNIT DIVISION: 0

## 2015-11-19 LAB — TYPE AND SCREEN
ABO/RH(D): O NEG
Antibody Screen: POSITIVE
DAT, IgG: NEGATIVE
UNIT DIVISION: 0
Unit division: 0

## 2016-01-10 ENCOUNTER — Other Ambulatory Visit: Payer: Self-pay | Admitting: Obstetrics and Gynecology

## 2018-08-01 LAB — OB RESULTS CONSOLE HIV ANTIBODY (ROUTINE TESTING): HIV: NONREACTIVE

## 2018-08-01 LAB — OB RESULTS CONSOLE RUBELLA ANTIBODY, IGM: Rubella: IMMUNE

## 2018-08-01 LAB — OB RESULTS CONSOLE RPR: RPR: NONREACTIVE

## 2018-08-01 LAB — OB RESULTS CONSOLE GC/CHLAMYDIA
Chlamydia: NEGATIVE
Gonorrhea: NEGATIVE

## 2018-08-01 LAB — OB RESULTS CONSOLE ABO/RH: RH Type: NEGATIVE

## 2018-08-01 LAB — OB RESULTS CONSOLE HEPATITIS B SURFACE ANTIGEN: Hepatitis B Surface Ag: NEGATIVE

## 2018-08-01 LAB — OB RESULTS CONSOLE ANTIBODY SCREEN: Antibody Screen: NEGATIVE

## 2018-08-16 ENCOUNTER — Inpatient Hospital Stay (HOSPITAL_COMMUNITY)
Admission: AD | Admit: 2018-08-16 | Discharge: 2018-08-17 | Disposition: A | Discharge status: Home / Self Care | Payer: BC Managed Care – PPO | Attending: Obstetrics & Gynecology | Admitting: Obstetrics & Gynecology

## 2018-08-16 ENCOUNTER — Encounter (HOSPITAL_COMMUNITY): Payer: Self-pay | Admitting: *Deleted

## 2018-08-16 ENCOUNTER — Other Ambulatory Visit: Payer: Self-pay

## 2018-08-16 DIAGNOSIS — Z3A08 8 weeks gestation of pregnancy: Secondary | ICD-10-CM | POA: Diagnosis not present

## 2018-08-16 DIAGNOSIS — O219 Vomiting of pregnancy, unspecified: Secondary | ICD-10-CM

## 2018-08-16 DIAGNOSIS — O210 Mild hyperemesis gravidarum: Secondary | ICD-10-CM | POA: Insufficient documentation

## 2018-08-16 DIAGNOSIS — Z3A01 Less than 8 weeks gestation of pregnancy: Secondary | ICD-10-CM

## 2018-08-16 LAB — URINALYSIS, ROUTINE W REFLEX MICROSCOPIC
Bacteria, UA: NONE SEEN
Bilirubin Urine: NEGATIVE
Glucose, UA: NEGATIVE mg/dL
Hgb urine dipstick: NEGATIVE
Ketones, ur: 20 mg/dL — AB
Nitrite: NEGATIVE
Protein, ur: >=300 mg/dL — AB
Specific Gravity, Urine: 1.034 — ABNORMAL HIGH (ref 1.005–1.030)
pH: 5 (ref 5.0–8.0)

## 2018-08-16 MED ORDER — M.V.I. ADULT IV INJ
Freq: Once | INTRAVENOUS | Status: AC
  Administered 2018-08-16: 23:00:00 via INTRAVENOUS
  Filled 2018-08-16: qty 1000

## 2018-08-16 MED ORDER — FAMOTIDINE IN NACL 20-0.9 MG/50ML-% IV SOLN
20.0000 mg | Freq: Once | INTRAVENOUS | Status: AC
  Administered 2018-08-16: 20 mg via INTRAVENOUS
  Filled 2018-08-16: qty 50

## 2018-08-16 MED ORDER — GLYCOPYRROLATE 0.2 MG/ML IJ SOLN
0.2000 mg | Freq: Once | INTRAMUSCULAR | Status: AC
  Administered 2018-08-17: 0.2 mg via INTRAVENOUS
  Filled 2018-08-16: qty 1

## 2018-08-16 MED ORDER — ONDANSETRON HCL 4 MG/2ML IJ SOLN
4.0000 mg | Freq: Once | INTRAMUSCULAR | Status: AC
  Administered 2018-08-17: 4 mg via INTRAVENOUS
  Filled 2018-08-16: qty 2

## 2018-08-16 MED ORDER — PROMETHAZINE HCL 25 MG/ML IJ SOLN
25.0000 mg | Freq: Once | INTRAVENOUS | Status: AC
  Administered 2018-08-16: 25 mg via INTRAVENOUS
  Filled 2018-08-16: qty 1

## 2018-08-16 NOTE — MAU Note (Signed)
Vomiting off and on for couple wks. Worse this wk. Have Reglan, Zofran, and Diclegis at home which was helping. Now unable to keep down anything. Denies vag bleeding or d/c. No pain

## 2018-08-16 NOTE — MAU Provider Note (Addendum)
History     CSN: [id number]  Arrival date and time: 08/16/18 6789   First Provider Initiated Contact with Patient 08/16/18 2030      Chief Complaint  Patient presents with  . Emesis During Pregnancy   HPI  Ms.  Evelyn Marshall is a 33 y.o. year old [id number] female at [redacted]w[redacted]d weeks gestation who presents to MAU reporting vomiting off and on x 2 wks, but worse this week. She reports the Reglan, Diclegis, and Zofran are not working. She has been Rx'd Protonix 40 mg daily, but has not taken it yet. She is unable to keep anything down. She last ate and drank something at 1830, but did not keep that down. She has a h/o hyperemesis gravidarum, was admitted and put on steroids and anti-emetics to try to manage her N/V. She states that she and her OB are "trying to stay ahead of her developing HG this time." She receives [facility] with [facility] (Dr. Simona Huh).  Past Medical History:  Diagnosis Date  . Abnormal Pap smear 2012   colpo result benign  . Pregnancy induced hypertension   . Vaginal Pap smear, abnormal     Past Surgical History:  Procedure Laterality Date  . WISDOM TOOTH EXTRACTION      Family History  Problem Relation Age of Onset  . Heart disease Father   . Hypertension Father     Social History   Tobacco Use  . Smoking status: Never Smoker  . Smokeless tobacco: Never Used  Substance Use Topics  . Alcohol use: No  . Drug use: No    Allergies: No Known Allergies  Medications Prior to Admission  Medication Sig Dispense Refill Last Dose  . metoCLOPramide (REGLAN) 10 MG tablet Take 10 mg by mouth 4 (four) times daily.     . ondansetron (ZOFRAN) 4 MG tablet Take 4 mg by mouth every 8 (eight) hours as needed for nausea or vomiting.     . ondansetron (ZOFRAN-ODT) 4 MG disintegrating tablet Take 4 mg by mouth every 8 (eight) hours as needed for nausea or vomiting.     . pantoprazole (PROTONIX) 40 MG tablet Take 40 mg by mouth at bedtime.     . Prenatal MV-Min-FA-Omega-3 (PRENATAL  GUMMIES/DHA & FA) 0.4-32.5 MG CHEW Chew 2 each by mouth at bedtime.       Review of Systems  Constitutional: Positive for appetite change (unable to keep anything down) and fatigue.  HENT: Negative.   Eyes: Negative.   Respiratory: Negative.   Cardiovascular: Negative.   Gastrointestinal: Positive for nausea and vomiting.  Endocrine: Negative.   Genitourinary: Negative.   Musculoskeletal: Negative.   Skin: Negative.   Allergic/Immunologic: Negative.   Neurological: Negative.   Hematological: Negative.   Psychiatric/Behavioral: Negative.    Physical Exam   Blood pressure 136/79, pulse 80, temperature 98.8 F (37.1 C), resp. rate 16, height 5\' 4"  (1.626 m), weight 118.8 kg, last menstrual period 06/18/2018, SpO2 100 %.  Physical Exam  Nursing note and vitals reviewed. Constitutional: She is oriented to person, place, and time. She appears well-developed and well-nourished.  HENT:  Head: Normocephalic and atraumatic.  Eyes: Pupils are equal, round, and reactive to light.  Neck: Normal range of motion.  Cardiovascular: Normal rate.  Respiratory: Effort normal.  GI: Soft. Bowel sounds are decreased.  Musculoskeletal: Normal range of motion.  Neurological: She is alert and oriented to person, place, and time.  Skin: Skin is warm and dry.  Psychiatric: She has a  normal mood and affect. Her behavior is normal. Judgment and thought content normal.    MAU Course  Procedures  MDM CCUA IVFs: Phenergan 25 mg in LR 1000 ml @ 999 ml/hr; followed by MVI in LR 1000 ml @ 500 ml/hr  PO Challenge    Report given to and care assumed by Fatima Blank, CNM @ 2050  Laury Deep, MSN, CNM 08/16/2018, 8:30 PM   MDM  Pt with continued nausea and spitting so additional IV fluids and robinul IV given. Pt with improved symptoms.  D/C home with Rx for Phenergan, Robinul, and Protonix. Pt to continue Zofran as prescribed. F/u in office as scheduled, return to MAU with worsening  symptoms/emergencies.  Assessment and Plan   1. Nausea and vomiting during pregnancy prior to [redacted] weeks gestation     D/C home with return precautions  Fatima Blank, CNM 1:59 AM

## 2018-08-17 DIAGNOSIS — O210 Mild hyperemesis gravidarum: Secondary | ICD-10-CM | POA: Diagnosis not present

## 2018-08-17 MED ORDER — PROMETHAZINE HCL 25 MG PO TABS: 25.0000 mg | ORAL_TABLET | Freq: Four times a day (QID) | ORAL | 2 refills | Status: DC | PRN

## 2018-08-17 MED ORDER — GLYCOPYRROLATE 2 MG PO TABS: 2.0000 mg | ORAL_TABLET | Freq: Three times a day (TID) | ORAL | 3 refills | Status: DC | PRN

## 2018-08-17 MED ORDER — PANTOPRAZOLE SODIUM 40 MG PO TBEC: 40.0000 mg | DELAYED_RELEASE_TABLET | Freq: Every day | ORAL | 5 refills | Status: DC

## 2018-08-20 ENCOUNTER — Observation Stay (HOSPITAL_COMMUNITY)
Admission: AD | Admit: 2018-08-20 | Discharge: 2018-08-22 | Disposition: A | Discharge status: Home / Self Care | Payer: BC Managed Care – PPO | Source: Ambulatory Visit | Attending: Obstetrics and Gynecology | Admitting: Obstetrics and Gynecology

## 2018-08-20 ENCOUNTER — Encounter (HOSPITAL_COMMUNITY): Payer: Self-pay | Admitting: *Deleted

## 2018-08-20 ENCOUNTER — Other Ambulatory Visit: Payer: Self-pay

## 2018-08-20 ENCOUNTER — Observation Stay (HOSPITAL_COMMUNITY): Payer: BC Managed Care – PPO

## 2018-08-20 DIAGNOSIS — O210 Mild hyperemesis gravidarum: Secondary | ICD-10-CM

## 2018-08-20 DIAGNOSIS — Z3A09 9 weeks gestation of pregnancy: Secondary | ICD-10-CM | POA: Insufficient documentation

## 2018-08-20 DIAGNOSIS — O161 Unspecified maternal hypertension, first trimester: Secondary | ICD-10-CM | POA: Insufficient documentation

## 2018-08-20 DIAGNOSIS — Z1159 Encounter for screening for other viral diseases: Secondary | ICD-10-CM | POA: Diagnosis not present

## 2018-08-20 HISTORY — DX: Mild hyperemesis gravidarum: O21.0

## 2018-08-20 LAB — COMPREHENSIVE METABOLIC PANEL
ALT: 33 U/L (ref 0–44)
AST: 46 U/L — ABNORMAL HIGH (ref 15–41)
Albumin: 3.3 g/dL — ABNORMAL LOW (ref 3.5–5.0)
Alkaline Phosphatase: 88 U/L (ref 38–126)
Anion gap: 11 (ref 5–15)
BUN: <5 mg/dL — ABNORMAL LOW (ref 6–20)
CO2: 22 mmol/L (ref 22–32)
Calcium: 9.1 mg/dL (ref 8.9–10.3)
Chloride: 100 mmol/L (ref 98–111)
Creatinine, Ser: 0.64 mg/dL (ref 0.44–1.00)
GFR calc Af Amer: >60 mL/min (ref >60)
GFR calc non Af Amer: >60 mL/min (ref >60)
Glucose, Bld: 92 mg/dL (ref 70–99)
Potassium: 4.5 mmol/L (ref 3.5–5.1)
Sodium: 133 mmol/L — ABNORMAL LOW (ref 135–145)
Total Bilirubin: 1.4 mg/dL — ABNORMAL HIGH (ref 0.3–1.2)
Total Protein: 6.9 g/dL (ref 6.5–8.1)

## 2018-08-20 LAB — CBC
HCT: 33.6 % — ABNORMAL LOW (ref 36.0–46.0)
Hemoglobin: 10.8 g/dL — ABNORMAL LOW (ref 12.0–15.0)
MCH: 23.7 pg — ABNORMAL LOW (ref 26.0–34.0)
MCHC: 32.1 g/dL (ref 30.0–36.0)
MCV: 73.8 fL — ABNORMAL LOW (ref 80.0–100.0)
Platelets: 295 10*3/uL (ref 150–400)
RBC: 4.55 MIL/uL (ref 3.87–5.11)
RDW: 16.3 % — ABNORMAL HIGH (ref 11.5–15.5)
WBC: 8.4 10*3/uL (ref 4.0–10.5)
nRBC: 0 % (ref 0.0–0.2)

## 2018-08-20 LAB — PROTEIN / CREATININE RATIO, URINE
Creatinine, Urine: 229.52 mg/dL
Protein Creatinine Ratio: 0.44 mg/mg{Cre} — ABNORMAL HIGH (ref 0.00–0.15)
Total Protein, Urine: 102 mg/dL

## 2018-08-20 LAB — SARS CORONAVIRUS 2 BY RT PCR (HOSPITAL ORDER, PERFORMED IN ~~LOC~~ HOSPITAL LAB): SARS Coronavirus 2: NEGATIVE

## 2018-08-20 IMAGING — US OBSTETRIC <14 WK ULTRASOUND
1 series · 15 of 23 positions shown · non-contrast
Comparison: None.

CLINICAL DATA: Hyper emesis gravida

EXAM:
OBSTETRIC <14 WK ULTRASOUND
TECHNIQUE: Transabdominal ultrasound was performed for evaluation of the
gestation as well as the maternal uterus and adnexal regions.

[Series 1: obstetric <14 wk ultrasound · 15 of 23 slices shown]
[im 1/23]
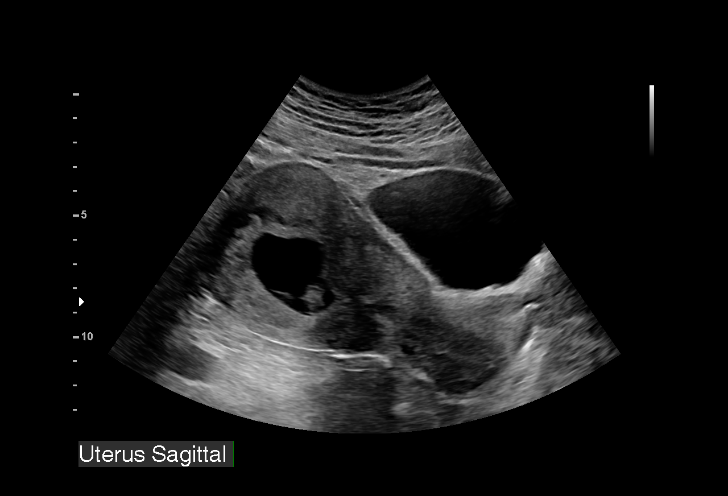
[im 3/23]
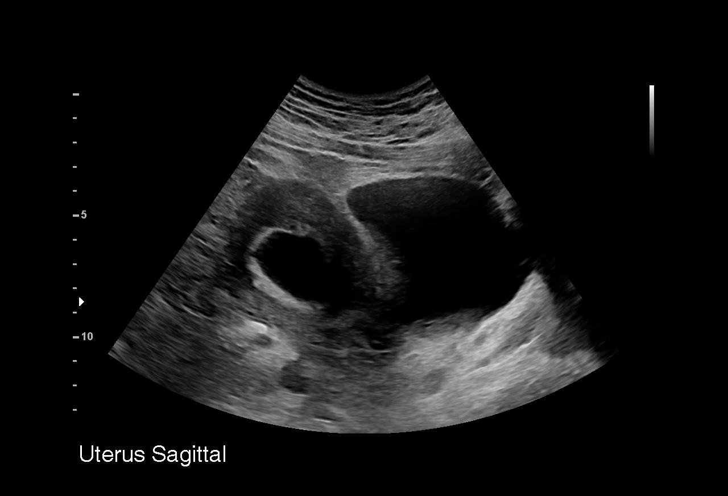
[im 4/23]
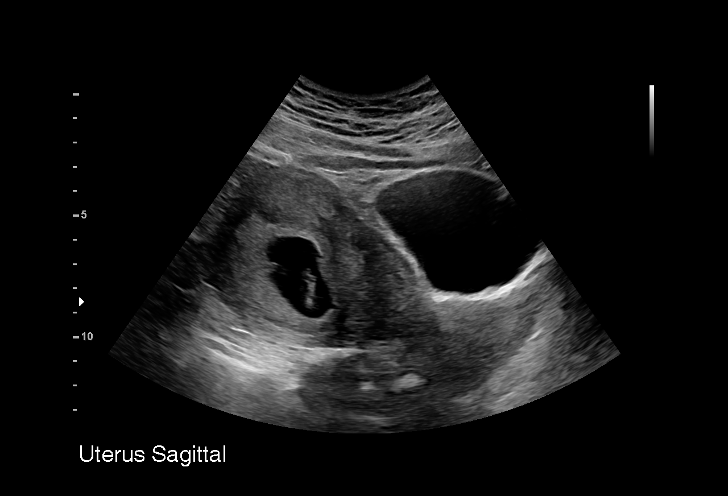
[im 6/23]
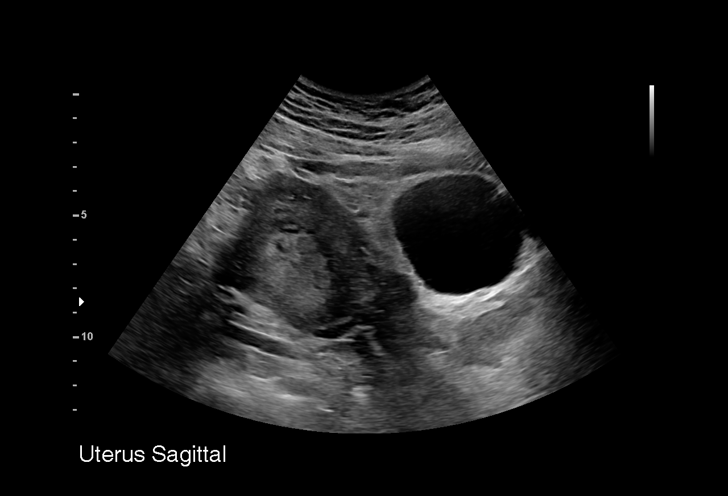
[im 7/23]
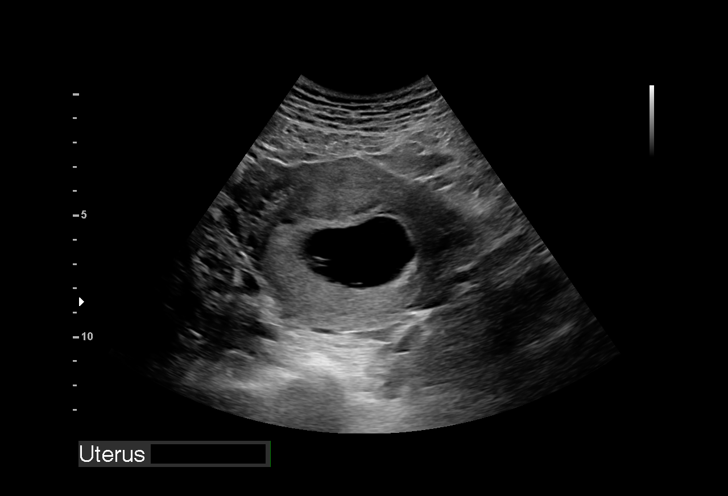
[im 9/23]
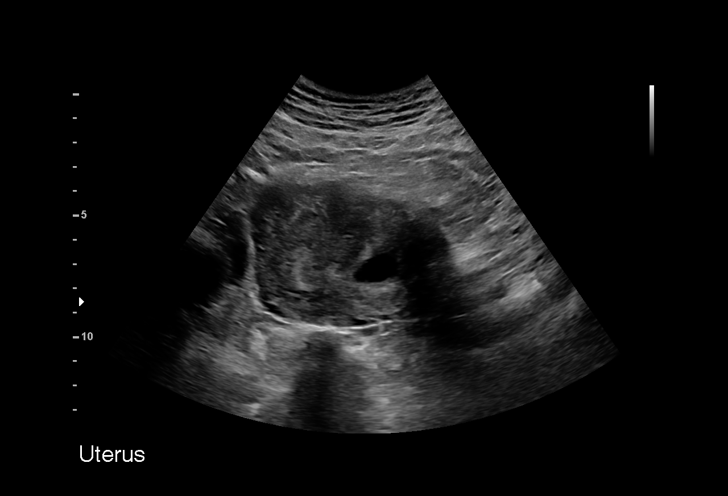
[im 10/23]
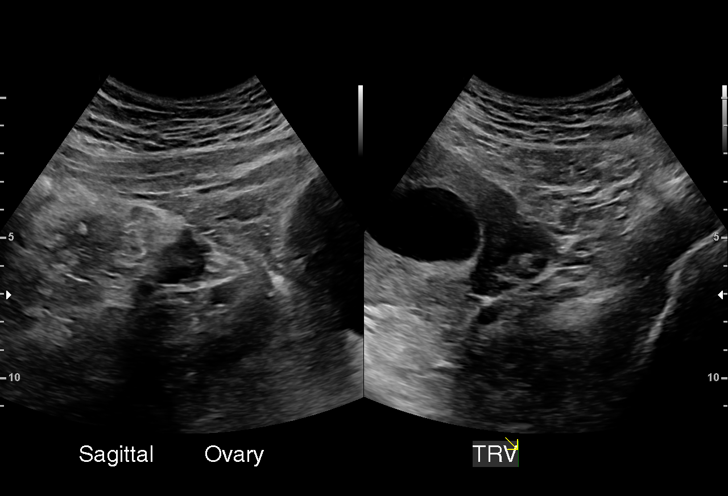
[im 12/23]
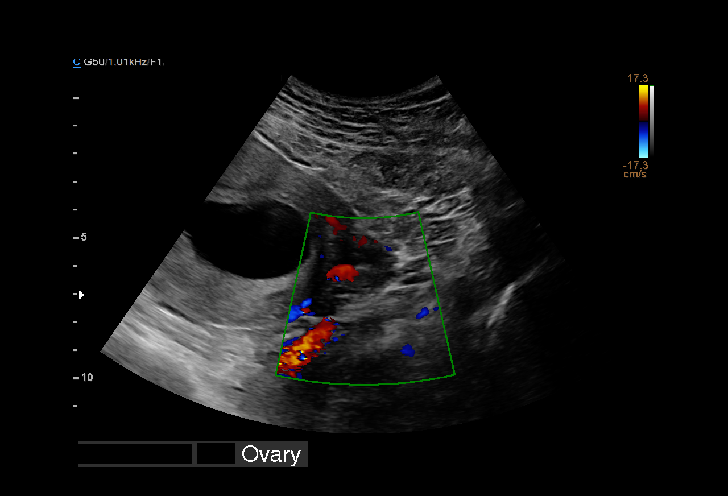
[im 14/23]
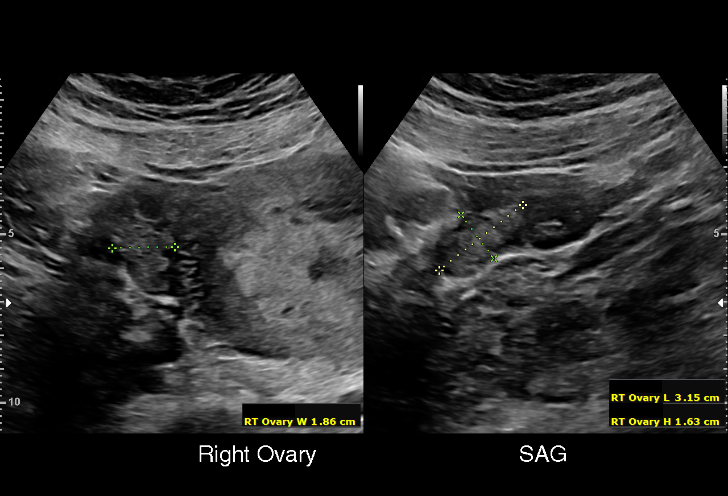
[im 15/23]
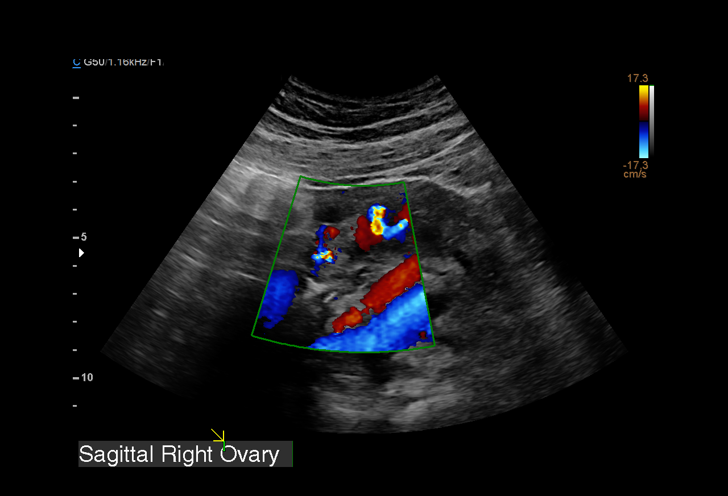
[im 17/23]
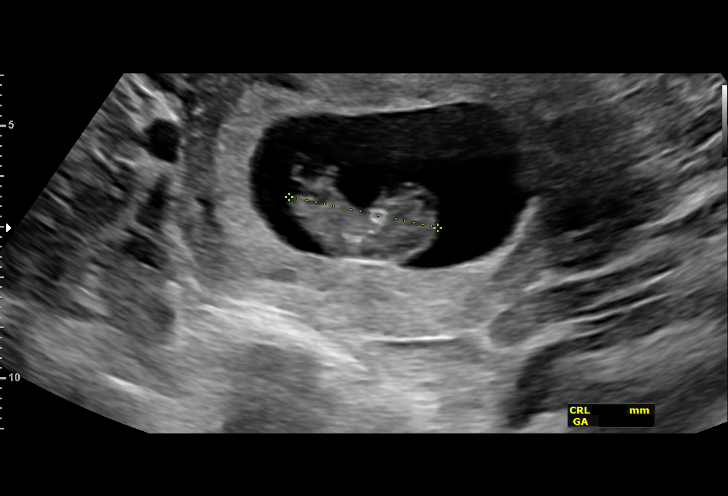
[im 18/23]
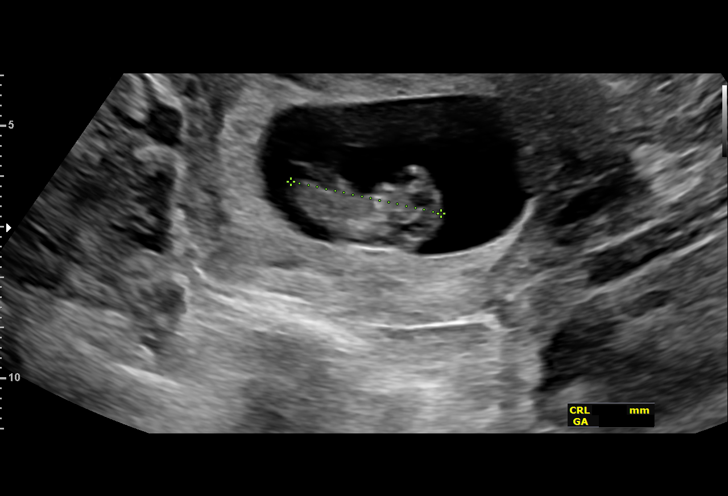
[im 20/23]
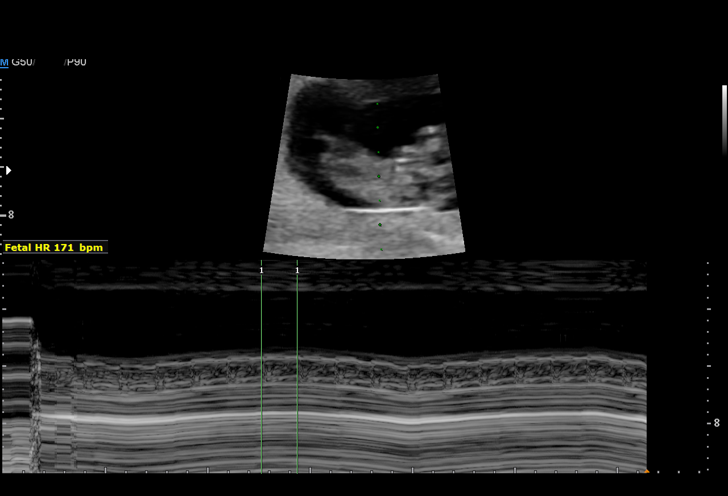
[im 21/23]
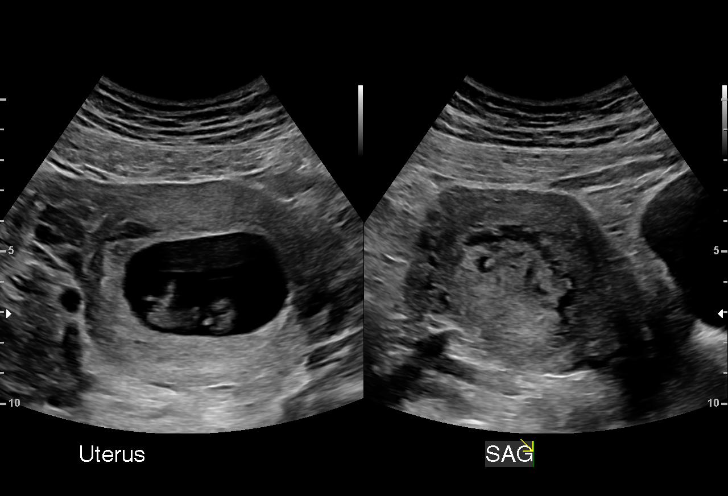
[im 23/23]
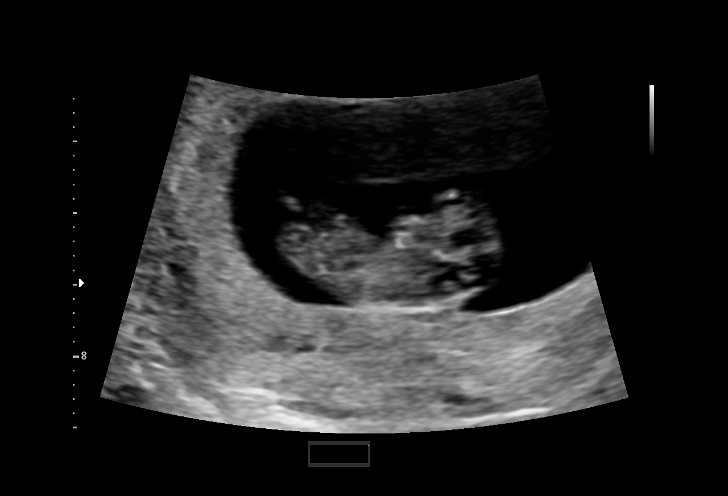

[15 of 23 positions shown; findings below may reference images not displayed]

FINDINGS: Intrauterine gestational sac: Single

Yolk sac:  Visualized.

Embryo:  Visualized.

Cardiac Activity: Visualized.

Heart Rate: 171 bpm

CRL: 30.3 mm   9 w 6 d                  US EDC: [DATE]

Subchorionic hemorrhage: Small subchorionic hemorrhage along the
right anterior sac.

Maternal uterus/adnexae: Ovaries are within normal limits. Right
ovary measures 3.2 x 1.6 x 1.9 cm. Left ovary measures 1.5 x 2.1 x
1.7 cm. No significant free fluid.
IMPRESSION: 1. Single viable intrauterine pregnancy as above.
2. Small subchorionic hemorrhage

## 2018-08-20 MED ORDER — PANTOPRAZOLE SODIUM 40 MG IV SOLR
40.0000 mg | INTRAVENOUS | Status: DC
  Administered 2018-08-20: 40 mg via INTRAVENOUS
  Filled 2018-08-20: qty 40

## 2018-08-20 MED ORDER — SODIUM CHLORIDE 0.9% FLUSH: 10.0000 mL | INTRAVENOUS | Status: DC | PRN

## 2018-08-20 MED ORDER — CALCIUM CARBONATE ANTACID 500 MG PO CHEW: 2.0000 | CHEWABLE_TABLET | ORAL | Status: DC | PRN

## 2018-08-20 MED ORDER — DOCUSATE SODIUM 100 MG PO CAPS
100.0000 mg | ORAL_CAPSULE | Freq: Every day | ORAL | Status: DC
  Administered 2018-08-21 – 2018-08-22 (×2): 100 mg via ORAL
  Filled 2018-08-20 (×2): qty 1

## 2018-08-20 MED ORDER — GLYCOPYRROLATE 0.2 MG/ML IJ SOLN: 0.1000 mg | Freq: Three times a day (TID) | INTRAMUSCULAR | Status: DC | PRN

## 2018-08-20 MED ORDER — M.V.I. ADULT IV INJ
Freq: Once | INTRAVENOUS | Status: AC
  Administered 2018-08-20: 20:00:00 via INTRAVENOUS
  Filled 2018-08-20: qty 10

## 2018-08-20 MED ORDER — PROMETHAZINE HCL 25 MG/ML IJ SOLN: 25.0000 mg | Freq: Four times a day (QID) | INTRAMUSCULAR | Status: DC | PRN

## 2018-08-20 MED ORDER — METOCLOPRAMIDE HCL 5 MG/ML IJ SOLN
10.0000 mg | Freq: Four times a day (QID) | INTRAMUSCULAR | Status: DC
  Administered 2018-08-20 – 2018-08-21 (×4): 10 mg via INTRAVENOUS
  Filled 2018-08-20 (×4): qty 2

## 2018-08-20 MED ORDER — ONDANSETRON HCL 4 MG/2ML IJ SOLN
4.0000 mg | Freq: Three times a day (TID) | INTRAMUSCULAR | Status: DC
  Administered 2018-08-20 – 2018-08-21 (×3): 4 mg via INTRAVENOUS
  Filled 2018-08-20 (×3): qty 2

## 2018-08-20 MED ORDER — PRENATAL MULTIVITAMIN CH
1.0000 | ORAL_TABLET | Freq: Every day | ORAL | Status: DC
  Administered 2018-08-22: 1 via ORAL
  Filled 2018-08-20: qty 1

## 2018-08-20 MED ORDER — ZOLPIDEM TARTRATE 5 MG PO TABS: 5.0000 mg | ORAL_TABLET | Freq: Every evening | ORAL | Status: DC | PRN

## 2018-08-20 MED ORDER — SODIUM CHLORIDE 0.9% FLUSH
10.0000 mL | Freq: Two times a day (BID) | INTRAVENOUS | Status: DC
  Administered 2018-08-21: 10 mL

## 2018-08-20 MED ORDER — ACETAMINOPHEN 325 MG PO TABS: 650.0000 mg | ORAL_TABLET | ORAL | Status: DC | PRN

## 2018-08-20 MED ORDER — LACTATED RINGERS IV BOLUS
1000.0000 mL | Freq: Once | INTRAVENOUS | Status: DC
  Administered 2018-08-20: 1000 mL via INTRAVENOUS

## 2018-08-20 MED ORDER — LACTATED RINGERS IV SOLN
INTRAVENOUS | Status: DC
  Administered 2018-08-20: 150 mL/h via INTRAVENOUS
  Administered 2018-08-21: 10:00:00 via INTRAVENOUS
  Administered 2018-08-21: 150 mL/h via INTRAVENOUS

## 2018-08-20 NOTE — H&P (Signed)
Evelyn Marshall is a 33 y.o. female  9 0/7 weeks by LMP with h/o severe hyperemesis with previous pregnancies  Admitted for hyperemesis, persistent nausea and vomiting.  Pt was started on Diclegis and Zofran at 6 weeks.  Reglan was added at 7 weeks,  which was adequately managing symptoms until ~ 4 days ago.  Pt went to MAU for n/v.  Pt got IVF and was discharged home on new medications: Protonix, Phenergan, Robinul.  Pt reports for the last 2 days she has not been able to hold down solid or liquids. She feels her reflux contributes to her symptoms significantly but she actually can see that the vomits the Protonix tablet after 1 hour.  Pt reports she is taking medications as prescribed.  She is eating soup a few sips at a time and can barely keep that on her stomach.  She is vomiting water.  Pt reports she has had severe hyperemesis with previous pregnancies.  She has had an NG tube and a Reglan pump.  Pt's BP was 138/102 in the office today.  Pt has a h/o Preeclampsia.  OB History    Gravida  4   Para  3   Term  2   Preterm  1   AB  0   Living  2     SAB  0   TAB      Ectopic      Multiple  0   Live Births  3          Past Medical History:  Diagnosis Date  . Abnormal Pap smear 2012   colpo result benign  . Pregnancy induced hypertension   . Vaginal Pap smear, abnormal    Past Surgical History:  Procedure Laterality Date  . WISDOM TOOTH EXTRACTION     Family History: family history includes Heart disease in her father; Hypertension in her father. Social History:  reports that she has never smoked. She has never used smokeless tobacco. She reports that she does not drink alcohol or use drugs.   ROS History   Blood pressure (!) 143/92, pulse 78, temperature 98 F (36.7 C), temperature source Oral, resp. rate 17, last menstrual period 06/18/2018, SpO2 99 %, unknown if currently breastfeeding.   Fetal Exam Fetal Monitor Review: Doppler deferred due to gestational  age.     Physical Exam  Constitutional: She is oriented to person, place, and time. She appears well-developed and well-nourished.  Pt appears fatigued.  Pt leaning over her knees upon my arrival to room.  HENT:  Head: Normocephalic and atraumatic.  Eyes: EOM are normal.  Neck: Normal range of motion.  Cardiovascular: Normal rate and regular rhythm.  Respiratory: Effort normal and breath sounds normal. No respiratory distress.  GI: There is no abdominal tenderness.  Musculoskeletal: Normal range of motion.        General: No tenderness or edema.  Neurological: She is alert and oriented to person, place, and time.  Skin: Skin is warm and dry.  Mucus membranes not dry.  Psychiatric: She has a normal mood and affect.     Assessment/Plan: IUP @ 9 0/7 weeks by LMP Hyperemesis, h/o severe hyperemesis  -Unable to tolerate po medications at home x 48 hours.  Due to pt's history, recommend proactive  Management.  -IV Banana bag bolus then LR 150 ml/hr.    -NPO.  All medications IV.  Scheduled meds-Zofran, Reglan, Protonix.  Add Diclegis tomorrow.   PRN medicaiions-Phenergan, Robinul.  Consider steroid  taper  -Get ultrasound for dating, r/o multiple gestation and molar pregnancy.  -Consider advancing to clears in the morning. Elevated BP.  H/o GHTN and Preeclampsia.  Suspect pt has chronic HTN.  -Get PCR.  Seriel BPs.  Pt informed [facility] covering at [place] 08/20/2018, 6:27 PM

## 2018-08-21 DIAGNOSIS — O210 Mild hyperemesis gravidarum: Secondary | ICD-10-CM | POA: Diagnosis not present

## 2018-08-21 LAB — CULTURE, OB URINE

## 2018-08-21 MED ORDER — METOCLOPRAMIDE HCL 10 MG PO TABS
10.0000 mg | ORAL_TABLET | Freq: Four times a day (QID) | ORAL | Status: DC
  Administered 2018-08-21 – 2018-08-22 (×3): 10 mg via ORAL
  Filled 2018-08-21 (×3): qty 1

## 2018-08-21 MED ORDER — ONDANSETRON 4 MG PO TBDP
4.0000 mg | ORAL_TABLET | Freq: Three times a day (TID) | ORAL | Status: DC
  Administered 2018-08-21 – 2018-08-22 (×2): 4 mg via ORAL
  Filled 2018-08-21 (×2): qty 1

## 2018-08-21 MED ORDER — BOOST / RESOURCE BREEZE PO LIQD CUSTOM
1.0000 | Freq: Three times a day (TID) | ORAL | Status: DC
  Administered 2018-08-21 (×2): 1 via ORAL
  Filled 2018-08-21 (×5): qty 1

## 2018-08-21 MED ORDER — PROMETHAZINE HCL 25 MG RE SUPP
25.0000 mg | Freq: Four times a day (QID) | RECTAL | Status: DC | PRN
  Filled 2018-08-21: qty 1

## 2018-08-21 MED ORDER — PANTOPRAZOLE SODIUM 40 MG PO TBEC
40.0000 mg | DELAYED_RELEASE_TABLET | Freq: Every day | ORAL | Status: DC
  Administered 2018-08-21 – 2018-08-22 (×2): 40 mg via ORAL
  Filled 2018-08-21 (×2): qty 1

## 2018-08-21 MED ORDER — GLYCOPYRROLATE 0.2 MG/ML IJ SOLN
0.1000 mg | Freq: Three times a day (TID) | INTRAMUSCULAR | Status: DC
  Administered 2018-08-21 (×2): 0.1 mg via INTRAVENOUS
  Filled 2018-08-21 (×2): qty 1

## 2018-08-21 MED ORDER — PROMETHAZINE HCL 25 MG/ML IJ SOLN: 25.0000 mg | Freq: Four times a day (QID) | INTRAMUSCULAR | Status: DC | PRN

## 2018-08-21 MED ORDER — GLYCOPYRROLATE 1 MG PO TABS
1.0000 mg | ORAL_TABLET | Freq: Three times a day (TID) | ORAL | Status: DC
  Administered 2018-08-21: 1 mg via ORAL
  Filled 2018-08-21: qty 1

## 2018-08-21 MED ORDER — PROMETHAZINE HCL 25 MG PO TABS: 25.0000 mg | ORAL_TABLET | Freq: Four times a day (QID) | ORAL | Status: DC | PRN

## 2018-08-21 MED ORDER — DOXYLAMINE-PYRIDOXINE 10-10 MG PO TBEC
2.0000 | DELAYED_RELEASE_TABLET | Freq: Once | ORAL | Status: AC
  Administered 2018-08-21: 2 via ORAL
  Filled 2018-08-21 (×2): qty 2

## 2018-08-21 MED ORDER — DOXYLAMINE-PYRIDOXINE 10-10 MG PO TBEC: 1.0000 | DELAYED_RELEASE_TABLET | Freq: Two times a day (BID) | ORAL | Status: DC

## 2018-08-21 NOTE — Plan of Care (Signed)
  Problem: Education: Goal: Knowledge of General Education information will improve Description: Including pain rating scale, medication(s)/side effects and non-pharmacologic comfort measures Outcome: Completed/Met   Problem: Elimination: Goal: Will not experience complications related to urinary retention Outcome: Completed/Met   Problem: Pain Managment: Goal: General experience of comfort will improve Outcome: Completed/Met   Problem: Skin Integrity: Goal: Risk for impaired skin integrity will decrease Outcome: Completed/Met   Problem: Education: Goal: Knowledge of the prescribed therapeutic regimen will improve Outcome: Completed/Met   Problem: Education: Goal: Knowledge of the prescribed therapeutic regimen will improve Outcome: Completed/Met

## 2018-08-21 NOTE — Progress Notes (Signed)
Nutrition Consult: diet education for hyperemesis  Pt reports feeling better today, improved nausea, some vomiting. Trying clears as I entered the room. She reports this is the third pregnancy with hyperemesis. Resolved last pregnancy with steroid taper. 5 lb weight loss this pregnancy.   Discussed diet for hyperemesis. Pt familiar as diet education was offered during pregnancy in 2017. Copy of AND handout on diet for n/v during pregnancy provide to pt. Discussed food options to include as diet is advanced.   Resource [employer] ordered as a clear high protein supplement.  Weyman Rodney M.Fredderick Severance LDN Neonatal Nutrition Support Specialist/RD III Pager [phone]      Phone [phone]

## 2018-08-21 NOTE — Progress Notes (Signed)
Per RN, pt had slight vomiting after clear liquids but tolerated a bland soft diet without an issue.  Plan to change IV medications to po and advance diet.  Diclegis overnight.  Plan for discharge in am if minimal nausea and vomiting.  Dr. Nelda Marseille on call overnight.

## 2018-08-21 NOTE — Progress Notes (Signed)
Pt states she threw up once last night.  Feels less nauseous this morning.  Reports hunger.  Husband will bring Diclegis today. Pt denies h/o Chronic HTN.  Temp:  [98 F (36.7 C)-98.3 F (36.8 C)] 98.3 F (36.8 C) (07/29 0812) Pulse Rate:  [73-81] 75 (07/29 0815) Resp:  [17-20] 19 (07/29 0812) BP: (128-144)/(84-92) 138/86 (07/29 0815) SpO2:  [94 %-99 %] 94 % (07/29 0812) Weight:  [118.8 kg] 118.8 kg (07/28 1940)   Gen:  NAD, improved.  Pt spitting. Abd:  Soft, nontender, no RUQ pain Ext:  No calf tenderness, No TED hose  CBC    Component Value Date/Time   WBC 8.4 08/20/2018 1800   RBC 4.55 08/20/2018 1800   HGB 10.8 (L) 08/20/2018 1800   HCT 33.6 (L) 08/20/2018 1800   PLT 295 08/20/2018 1800   MCV 73.8 (L) 08/20/2018 1800   MCH 23.7 (L) 08/20/2018 1800   MCHC 32.1 08/20/2018 1800   RDW 16.3 (H) 08/20/2018 1800   LYMPHSABS 1.5 05/08/2015 2225   MONOABS 0.8 05/08/2015 2225   EOSABS 0.0 05/08/2015 2225   BASOSABS 0.0 05/08/2015 2225    CMP     Component Value Date/Time   NA 133 (L) 08/20/2018 1800   K 4.5 08/20/2018 1800   CL 100 08/20/2018 1800   CO2 22 08/20/2018 1800   GLUCOSE 92 08/20/2018 1800   BUN <5 (L) 08/20/2018 1800   CREATININE 0.64 08/20/2018 1800   CALCIUM 9.1 08/20/2018 1800   PROT 6.9 08/20/2018 1800   ALBUMIN 3.3 (L) 08/20/2018 1800   AST 46 (H) 08/20/2018 1800   ALT 33 08/20/2018 1800   ALKPHOS 88 08/20/2018 1800   BILITOT 1.4 (H) 08/20/2018 1800   GFRNONAA >60 08/20/2018 1800   GFRAA >60 08/20/2018 1800   PCR 0.44  Urinalysis    Component Value Date/Time   COLORURINE AMBER (A) 08/16/2018 2012   APPEARANCEUR CLOUDY (A) 08/16/2018 2012   LABSPEC 1.034 (H) 08/16/2018 2012   PHURINE 5.0 08/16/2018 2012   [place] NEGATIVE 08/16/2018 2012   HGBUR NEGATIVE 08/16/2018 2012   [place] NEGATIVE 08/16/2018 2012   KETONESUR 20 (A) 08/16/2018 2012   PROTEINUR >=300 (A) 08/16/2018 2012   UROBILINOGEN 2.0 (H) 03/17/2010 0956   NITRITE  NEGATIVE 08/16/2018 2012   LEUKOCYTESUR SMALL (A) 08/16/2018 2012    Ultrasound- Single, IUP, viable @ 9 6/7 weeks.  Small [place].  A/P IUP @ 9 1/7 weeks Hyperemeisis  Improved with IV medications.  Advance to clear liquids.  If tolerated, change to po medications.  If not, steroid taper.  Diclegis tonight.  Nutrition consult to review diet options. Ptyalism    Robinul TID instead of prn. Chronic HTN  Elevated PCR.  Urine culture ordered to rule out UTI.  Consider 24 hour urine if no UTI to assess for renal disease.  Creatinine normal. Slightly elevated AST.  Likely due to vomiting.  Recheck in am. [place], small  No bleeding or signs of SAB.  Rh negative.  If pt tolerates po, plan for discharge in am.

## 2018-08-22 DIAGNOSIS — O210 Mild hyperemesis gravidarum: Secondary | ICD-10-CM | POA: Diagnosis not present

## 2018-08-22 LAB — COMPREHENSIVE METABOLIC PANEL
ALT: 37 U/L (ref 0–44)
AST: 26 U/L (ref 15–41)
Albumin: 2.8 g/dL — ABNORMAL LOW (ref 3.5–5.0)
Alkaline Phosphatase: 93 U/L (ref 38–126)
Anion gap: 8 (ref 5–15)
BUN: <5 mg/dL — ABNORMAL LOW (ref 6–20)
CO2: 23 mmol/L (ref 22–32)
Calcium: 9 mg/dL (ref 8.9–10.3)
Chloride: 105 mmol/L (ref 98–111)
Creatinine, Ser: 0.68 mg/dL (ref 0.44–1.00)
GFR calc Af Amer: >60 mL/min (ref >60)
GFR calc non Af Amer: >60 mL/min (ref >60)
Glucose, Bld: 80 mg/dL (ref 70–99)
Potassium: 3.5 mmol/L (ref 3.5–5.1)
Sodium: 136 mmol/L (ref 135–145)
Total Bilirubin: 0.4 mg/dL (ref 0.3–1.2)
Total Protein: 6.3 g/dL — ABNORMAL LOW (ref 6.5–8.1)

## 2018-08-22 MED ORDER — METOCLOPRAMIDE HCL 10 MG PO TABS: 10.0000 mg | ORAL_TABLET | Freq: Four times a day (QID) | ORAL | 1 refills | Status: DC

## 2018-08-22 MED ORDER — PROMETHAZINE HCL 25 MG RE SUPP: 25.0000 mg | Freq: Four times a day (QID) | RECTAL | 2 refills | Status: DC | PRN

## 2018-08-22 NOTE — Discharge Instructions (Signed)
Hyperemesis Gravidarum Hyperemesis gravidarum is a severe form of nausea and vomiting that happens during pregnancy. Hyperemesis is worse than morning sickness. It may cause you to have nausea or vomiting all day for many days. It may keep you from eating and drinking enough food and liquids, which can lead to dehydration, malnutrition, and weight loss. Hyperemesis usually occurs during the first half (the first 20 weeks) of pregnancy. It often goes away once a woman is in her second half of pregnancy. However, sometimes hyperemesis continues through an entire pregnancy. What are the causes? The cause of this condition is not known. It may be related to changes in chemicals (hormones) in the body during pregnancy, such as the high level of pregnancy hormone (human chorionic gonadotropin) or the increase in the female sex hormone (estrogen). What are the signs or symptoms? Symptoms of this condition include:  Nausea that does not go away.  Vomiting that does not allow you to keep any food down.  Weight loss.  Body fluid loss (dehydration).  Having no desire to eat, or not liking food that you have previously enjoyed. How is this diagnosed? This condition may be diagnosed based on:  A physical exam.  Your medical history.  Your symptoms.  Blood tests.  Urine tests. How is this treated? This condition is managed by controlling symptoms. This may include:  Following an eating plan. This can help lessen nausea and vomiting.  Taking prescription medicines. An eating plan and medicines are often used together to help control symptoms. If medicines do not help relieve nausea and vomiting, you may need to receive fluids through an IV at the hospital. Follow these instructions at home: Eating and drinking   Avoid the following: ? Drinking fluids with meals. Try not to drink anything during the 30 minutes before and after your meals. ? Drinking more than 1 cup of fluid at a  time. ? Eating foods that trigger your symptoms. These may include spicy foods, coffee, high-fat foods, very sweet foods, and acidic foods. ? Skipping meals. Nausea can be more intense on an empty stomach. If you cannot tolerate food, do not force it. Try sucking on ice chips or other frozen items and make up for missed calories later. ? Lying down within 2 hours after eating. ? Being exposed to environmental triggers. These may include food smells, smoky rooms, closed spaces, rooms with strong smells, warm or humid places, overly loud and noisy rooms, and rooms with motion or flickering lights. Try eating meals in a well-ventilated area that is free of strong smells. ? Quick and sudden changes in your movement. ? Taking iron pills and multivitamins that contain iron. If you take prescription iron pills, do not stop taking them unless your health care provider approves. ? Preparing food. The smell of food can spoil your appetite or trigger nausea.  To help relieve your symptoms: ? Listen to your body. Everyone is different and has different preferences. Find what works best for you. ? Eat and drink slowly. ? Eat 5-6 small meals daily instead of 3 large meals. Eating small meals and snacks can help you avoid an empty stomach. ? In the morning, before getting out of bed, eat a couple of crackers to avoid moving around on an empty stomach. ? Try eating starchy foods as these are usually tolerated well. Examples include cereal, toast, bread, potatoes, pasta, rice, and pretzels. ? Include at least 1 serving of protein with your meals and snacks. Protein options include  lean meats, poultry, seafood, beans, nuts, nut butters, eggs, cheese, and yogurt. ? Try eating a protein-rich snack before bed. Examples of a protein-rick snack include cheese and crackers or a peanut butter sandwich made with 1 slice of whole-wheat bread and 1 tsp (5 g) of peanut butter. ? Eat or suck on things that have ginger in them.  It may help relieve nausea. Add  tsp ground ginger to hot tea or choose ginger tea. ? Try drinking 100% fruit juice or an electrolyte drink. An electrolyte drink contains sodium, potassium, and chloride. ? Drink fluids that are cold, clear, and carbonated or sour. Examples include lemonade, ginger ale, lemon-lime soda, ice water, and sparkling water. ? Brush your teeth or use a mouth rinse after meals. ? Talk with your health care provider about starting a supplement of vitamin B6. General instructions  Take over-the-counter and prescription medicines only as told by your health care provider.  Follow instructions from your health care provider about eating or drinking restrictions.  Continue to take your prenatal vitamins as told by your health care provider. If you are having trouble taking your prenatal vitamins, talk with your health care provider about different options.  Keep all follow-up and pre-birth (prenatal) visits as told by your health care provider. This is important. Contact a health care provider if:  You have pain in your abdomen.  You have a severe headache.  You have vision problems.  You are losing weight.  You feel weak or dizzy. Get help right away if:  You cannot drink fluids without vomiting.  You vomit blood.  You have constant nausea and vomiting.  You are very weak.  You faint.  You have a fever and your symptoms suddenly get worse. Summary  Hyperemesis gravidarum is a severe form of nausea and vomiting that happens during pregnancy.  Making some changes to your eating habits may help relieve nausea and vomiting.  This condition may be managed with medicine.  If medicines do not help relieve nausea and vomiting, you may need to receive fluids through an IV at the hospital. This information is not intended to replace advice given to you by your health care provider. Make sure you discuss any questions you have with your health care  provider. Document Released: 01/09/2005 Document Revised: 01/29/2017 Document Reviewed: 09/08/2015 Elsevier Patient Education  2020 [place].   Hyperemesis Gravidarum Hyperemesis gravidarum is a severe form of nausea and vomiting that happens during pregnancy. Hyperemesis is worse than morning sickness. It may cause you to have nausea or vomiting all day for many days. It may keep you from eating and drinking enough food and liquids, which can lead to dehydration, malnutrition, and weight loss. Hyperemesis usually occurs during the first half (the first 20 weeks) of pregnancy. It often goes away once a woman is in her second half of pregnancy. However, sometimes hyperemesis continues through an entire pregnancy. What are the causes? The cause of this condition is not known. It may be related to changes in chemicals (hormones) in the body during pregnancy, such as the high level of pregnancy hormone (human chorionic gonadotropin) or the increase in the female sex hormone (estrogen). What are the signs or symptoms? Symptoms of this condition include:  Nausea that does not go away.  Vomiting that does not allow you to keep any food down.  Weight loss.  Body fluid loss (dehydration).  Having no desire to eat, or not liking food that you have previously enjoyed. How  is this diagnosed? This condition may be diagnosed based on:  A physical exam.  Your medical history.  Your symptoms.  Blood tests.  Urine tests. How is this treated? This condition is managed by controlling symptoms. This may include:  Following an eating plan. This can help lessen nausea and vomiting.  Taking prescription medicines. An eating plan and medicines are often used together to help control symptoms. If medicines do not help relieve nausea and vomiting, you may need to receive fluids through an IV at the hospital. Follow these instructions at home: Eating and drinking   Avoid the  following: ? Drinking fluids with meals. Try not to drink anything during the 30 minutes before and after your meals. ? Drinking more than 1 cup of fluid at a time. ? Eating foods that trigger your symptoms. These may include spicy foods, coffee, high-fat foods, very sweet foods, and acidic foods. ? Skipping meals. Nausea can be more intense on an empty stomach. If you cannot tolerate food, do not force it. Try sucking on ice chips or other frozen items and make up for missed calories later. ? Lying down within 2 hours after eating. ? Being exposed to environmental triggers. These may include food smells, smoky rooms, closed spaces, rooms with strong smells, warm or humid places, overly loud and noisy rooms, and rooms with motion or flickering lights. Try eating meals in a well-ventilated area that is free of strong smells. ? Quick and sudden changes in your movement. ? Taking iron pills and multivitamins that contain iron. If you take prescription iron pills, do not stop taking them unless your health care provider approves. ? Preparing food. The smell of food can spoil your appetite or trigger nausea.  To help relieve your symptoms: ? Listen to your body. Everyone is different and has different preferences. Find what works best for you. ? Eat and drink slowly. ? Eat 5-6 small meals daily instead of 3 large meals. Eating small meals and snacks can help you avoid an empty stomach. ? In the morning, before getting out of bed, eat a couple of crackers to avoid moving around on an empty stomach. ? Try eating starchy foods as these are usually tolerated well. Examples include cereal, toast, bread, potatoes, pasta, rice, and pretzels. ? Include at least 1 serving of protein with your meals and snacks. Protein options include lean meats, poultry, seafood, beans, nuts, nut butters, eggs, cheese, and yogurt. ? Try eating a protein-rich snack before bed. Examples of a protein-rick snack include cheese and  crackers or a peanut butter sandwich made with 1 slice of whole-wheat bread and 1 tsp (5 g) of peanut butter. ? Eat or suck on things that have ginger in them. It may help relieve nausea. Add  tsp ground ginger to hot tea or choose ginger tea. ? Try drinking 100% fruit juice or an electrolyte drink. An electrolyte drink contains sodium, potassium, and chloride. ? Drink fluids that are cold, clear, and carbonated or sour. Examples include lemonade, ginger ale, lemon-lime soda, ice water, and sparkling water. ? Brush your teeth or use a mouth rinse after meals. ? Talk with your health care provider about starting a supplement of vitamin B6. General instructions  Take over-the-counter and prescription medicines only as told by your health care provider.  Follow instructions from your health care provider about eating or drinking restrictions.  Continue to take your prenatal vitamins as told by your health care provider. If you are having trouble  taking your prenatal vitamins, talk with your health care provider about different options.  Keep all follow-up and pre-birth (prenatal) visits as told by your health care provider. This is important. Contact a health care provider if:  You have pain in your abdomen.  You have a severe headache.  You have vision problems.  You are losing weight.  You feel weak or dizzy. Get help right away if:  You cannot drink fluids without vomiting.  You vomit blood.  You have constant nausea and vomiting.  You are very weak.  You faint.  You have a fever and your symptoms suddenly get worse. Summary  Hyperemesis gravidarum is a severe form of nausea and vomiting that happens during pregnancy.  Making some changes to your eating habits may help relieve nausea and vomiting.  This condition may be managed with medicine.  If medicines do not help relieve nausea and vomiting, you may need to receive fluids through an IV at the hospital. This  information is not intended to replace advice given to you by your health care provider. Make sure you discuss any questions you have with your health care provider. Document Released: 01/09/2005 Document Revised: 01/29/2017 Document Reviewed: 09/08/2015 Elsevier Patient Education  2020 [employer].

## 2018-08-22 NOTE — Progress Notes (Signed)
Teaching complete  meds returned from pharmacy to patient

## 2018-09-09 ENCOUNTER — Other Ambulatory Visit (HOSPITAL_COMMUNITY)
Admission: RE | Admit: 2018-09-09 | Discharge: 2018-09-09 | Disposition: A | Discharge status: Home / Self Care | Payer: BC Managed Care – PPO | Source: Ambulatory Visit | Attending: Obstetrics and Gynecology | Admitting: Obstetrics and Gynecology

## 2018-09-09 ENCOUNTER — Other Ambulatory Visit: Payer: Self-pay | Admitting: Obstetrics and Gynecology

## 2018-09-09 DIAGNOSIS — Z124 Encounter for screening for malignant neoplasm of cervix: Secondary | ICD-10-CM | POA: Diagnosis present

## 2018-09-11 LAB — CYTOLOGY - PAP
Diagnosis: NEGATIVE
HPV: NOT DETECTED

## 2018-10-01 NOTE — Discharge Summary (Signed)
Physician Discharge Summary  Patient ID: Evelyn Marshall MRN: [id number] DOB/AGE: 07/11/1985 33 y.o.  Admit date: 08/20/2018 Discharge date: 10/01/2018  Admission Diagnoses: IUP at 9 1/7 weeks, Hyperemesis Gravidarum  Discharge Diagnoses:  Active Problems:   Hyperemesis gravidarum   Discharged Condition: Improved.  Hospital Course: Pt made NPO.  Pt was given IV medications-Protonix, Reglan, Zofran, Robinul, IV Banana bag.  Diclegis po was added HS #2.  Pt was able to tolerate liquids and some solids at time of discharge. Pt also had elevated BP and diagnosed with CHTN.  24 hour protein showed increased protein.  Consults: None  Significant Diagnostic Studies: radiology: Ultrasound: Single, viable IUP, Clements present.  Treatments: IV hydration  Discharge Exam: Blood pressure (!) 138/94, pulse 82, temperature 98.2 F (36.8 C), temperature source Oral, resp. rate 18, height 5\' 4"  (1.626 m), weight 118.8 kg, last menstrual period 06/18/2018, SpO2 98 %, unknown if currently breastfeeding. General: NAD.  Abd:  No abdominal pain Ext:  no calf tenderness or edema.   Disposition: Discharge disposition: 01-Home or Self Care       Discharge Instructions    Discharge activity:  No Restrictions   Complete by: As directed    Discharge diet:   Complete by: As directed    Bland diet.  Add Boost as needed.     Allergies as of 08/22/2018   No Known Allergies     Medication List    TAKE these medications   glycopyrrolate 2 MG tablet Commonly known as: ROBINUL Take 1 tablet (2 mg total) by mouth 3 (three) times daily as needed.   metoCLOPramide 10 MG tablet Commonly known as: REGLAN Take 1 tablet (10 mg total) by mouth every 6 (six) hours. What changed: when to take this   ondansetron 4 MG disintegrating tablet Commonly known as: ZOFRAN-ODT Take 4 mg by mouth every 8 (eight) hours as needed for nausea or vomiting.   ondansetron 4 MG tablet Commonly known as: ZOFRAN Take 4 mg by  mouth every 8 (eight) hours as needed for nausea or vomiting.   pantoprazole 40 MG tablet Commonly known as: PROTONIX Take 1 tablet (40 mg total) by mouth at bedtime.   Prenatal Gummies/DHA & FA 0.4-32.5 MG Chew Chew 2 each by mouth at bedtime.   promethazine 25 MG tablet Commonly known as: PHENERGAN Take 1 tablet (25 mg total) by mouth every 6 (six) hours as needed for nausea or vomiting. What changed: Another medication with the same name was added. Make sure you understand how and when to take each.   promethazine 25 MG suppository Commonly known as: Phenergan Place 1 suppository (25 mg total) rectally every 6 (six) hours as needed for nausea or vomiting. What changed: You were already taking a medication with the same name, and this prescription was added. Make sure you understand how and when to take each.      Follow-up Information    Thurnell Lose, MD Follow up in 1 week(s).   Specialty: Obstetrics and Gynecology Why: Hospital follow up Contact information: [id number] [place] [address] [phone]           Signed: Thurnell Lose 10/01/2018, 12:23 AM

## 2018-12-31 ENCOUNTER — Other Ambulatory Visit: Payer: Self-pay

## 2019-01-13 ENCOUNTER — Encounter (HOSPITAL_COMMUNITY): Payer: Self-pay

## 2019-01-21 ENCOUNTER — Encounter (HOSPITAL_COMMUNITY): Payer: Self-pay

## 2019-01-24 NOTE — L&D Delivery Note (Signed)
Delivery Note At 7:55 AM a viable female was delivered via Vaginal, Spontaneous (Presentation LOA:      ).  APGAR: 9, 9; weight pending .   Placenta status: spontaneous ,intact  .  Cord:   with the following complications: Nuchal cord x 1, manually reduced .  Cord pH: n/a  Anesthesia: Epidural Episiotomy:  None Lacerations:  None Suture Repair: N/a Est. Blood Loss (mL):    Mom to postpartum.  Baby to Couplet care / Skin to Skin.  Thurnell Lose 02/26/2019, 8:12 AM

## 2019-01-27 ENCOUNTER — Ambulatory Visit (HOSPITAL_COMMUNITY): Payer: BC Managed Care – PPO | Admitting: *Deleted

## 2019-01-27 ENCOUNTER — Encounter (HOSPITAL_COMMUNITY): Payer: Self-pay | Admitting: *Deleted

## 2019-01-27 ENCOUNTER — Ambulatory Visit (HOSPITAL_COMMUNITY): Payer: BC Managed Care – PPO | Attending: Obstetrics and Gynecology | Admitting: Obstetrics and Gynecology

## 2019-01-27 ENCOUNTER — Other Ambulatory Visit: Payer: Self-pay

## 2019-01-27 VITALS — BP 142/75 | HR 89 | Temp 97.2°F

## 2019-01-27 DIAGNOSIS — Z79899 Other long term (current) drug therapy: Secondary | ICD-10-CM | POA: Diagnosis not present

## 2019-01-27 DIAGNOSIS — O10019 Pre-existing essential hypertension complicating pregnancy, unspecified trimester: Secondary | ICD-10-CM | POA: Insufficient documentation

## 2019-01-27 DIAGNOSIS — O10913 Unspecified pre-existing hypertension complicating pregnancy, third trimester: Secondary | ICD-10-CM | POA: Diagnosis not present

## 2019-01-27 DIAGNOSIS — O1213 Gestational proteinuria, third trimester: Secondary | ICD-10-CM

## 2019-01-27 DIAGNOSIS — O10919 Unspecified pre-existing hypertension complicating pregnancy, unspecified trimester: Secondary | ICD-10-CM

## 2019-01-27 DIAGNOSIS — Z3A33 33 weeks gestation of pregnancy: Secondary | ICD-10-CM | POA: Diagnosis not present

## 2019-01-27 DIAGNOSIS — Z7982 Long term (current) use of aspirin: Secondary | ICD-10-CM | POA: Diagnosis not present

## 2019-01-27 DIAGNOSIS — Z3A00 Weeks of gestation of pregnancy not specified: Secondary | ICD-10-CM | POA: Insufficient documentation

## 2019-01-27 DIAGNOSIS — I10 Essential (primary) hypertension: Secondary | ICD-10-CM | POA: Diagnosis present

## 2019-01-27 DIAGNOSIS — O1210 Gestational proteinuria, unspecified trimester: Secondary | ICD-10-CM

## 2019-01-27 NOTE — Progress Notes (Signed)
Maternal-Fetal Medicine   Name: Evelyn Marshall MRN: [id number] Requesting Provider: Dr. Thurnell Lose, MD  I the [facility] of [facility]  She is here for consultation because of increased protein/creatinine ratio on urinalysis. She has a diagnosis of chronic hypertension. She does not take antihypertensives. Patient reports (she logged in her phone) that her blood pressures were in the 140s/90s in August 2020 that returned to normal range. Today, at our office her blood pressures were 140/71 and (repeat) 142/75 mm Hg.  Patient does not have symptoms of severe headache, visual disturbances, right upper quadrant pain or vaginal bleeding.  She reports good fetal movements. Past medical history: She does not have diabetes or thyroid disorder or any other chronic medical conditions.  She reports she does not have sickle cell trait. Past surgical history: Oral surgery (dental). Medications: Aspirin 81 mg daily, prenatal vitamins, iron supplements, Diclegis, Zofran as needed. Allergies: No known drug allergies. Social history: Denies tobacco drug or alcohol use.  Partner, African-American, is in good health.  He is the father of her second son.  Patient is a Radio producer. Family history: No history of venous thromboembolism in the family. Obstetric history: -10/2004: Term vaginal delivery of a female infant weighing 8 pounds at birth patient reports her pregnancy was complicated by preeclampsia.  Her son is in good health. -02/2010: Fetal demise at 19 weeks following fetal hydrops.  Amniocentesis showed normal fetal karyotype and negative infectious work-up. -10/2015: Term vaginal delivery of a female infant weighing 6-14 at birth.  Her pregnancy was complicated by preeclampsia and the patient underwent induction of labor at [redacted] weeks gestation.  Her son is in good health. GYN history: See of abnormal Pap smear in 2017.  She had colposcopy and cervical biopsy.   Follow-up Pap smears were normal.  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  No history of breast disease.  Her menstrual cycles were reportedly normal before pregnancy.  Her prenatal course has been uneventful so far.  She reports she had low risk of fetal aneuploidies on screening.  Recent fetal growth assessment performed 2 weeks ago showed an normally grown fetus.  Labs: 24-hour Total protein 07/31/2018: 59 mg/dL 12/31/2018: 112.9 mg/dL  Protein-creatinine ratio 08/01/2018: 168 mg/g (0-200 mg/g) 12/31/2018: 478 mg/g  Labs including liver enzymes, platelets and creatinine are within normal range. Blood type: O negative (patient will be receiving rhogam). I counseled the patient on the following: Increased protein/creatinine ratio: This is considered as one of the diagnostic criteria for preeclampsia.  Protein/creatinine ratio is increased from the baseline value obtained 5 months earlier.  However, it is difficult to establish the diagnosis superimposed preeclampsia in a patient with chronic hypertension.  Fortunately, the patient does not have symptoms of severe features of preeclampsia. Timing of delivery is important in the management especially given her history of preeclampsia in previous pregnancies.  Today, systolic her blood pressure was over 140 mmHg.  Timing of delivery should be based on the presence or absence of hypertension and not on urine protein values alone. I counseled her that if her blood pressures are normal, we can consider delivery at [redacted] weeks gestation. Delivery should be considered at [redacted] weeks gestation if antihypertensives are considered or if her blood pressures are higher than her baseline values. I counseled her also on the symptoms of severe preeclampsia and that earlier delivery ([redacted] weeks gestation) would be considered  if she has severe features.  Recommendations: -Weekly BPP till delivery. -Delivery at 39 weeks  if her blood pressures are within normal range. -Delivery at 37 weeks if her blood pressures are consistently above the baseline values seen in this pregnancy. -Repeat urine protein estimation is not recommended. -Rhogam to be administered now.  Thank you for consult. Please do not hesitate to contact me at the [facility] if you have any questions.  Consultation including face-to-face counseling 40 minutes.

## 2019-02-03 ENCOUNTER — Encounter (HOSPITAL_COMMUNITY): Payer: Self-pay | Admitting: Obstetrics and Gynecology

## 2019-02-03 ENCOUNTER — Other Ambulatory Visit: Payer: Self-pay

## 2019-02-03 ENCOUNTER — Inpatient Hospital Stay (HOSPITAL_COMMUNITY)
Admission: AD | Admit: 2019-02-03 | Discharge: 2019-02-03 | Disposition: A | Discharge status: Home / Self Care | Payer: BC Managed Care – PPO | Attending: Obstetrics and Gynecology | Admitting: Obstetrics and Gynecology

## 2019-02-03 DIAGNOSIS — Z3A33 33 weeks gestation of pregnancy: Secondary | ICD-10-CM | POA: Diagnosis not present

## 2019-02-03 DIAGNOSIS — Z8249 Family history of ischemic heart disease and other diseases of the circulatory system: Secondary | ICD-10-CM | POA: Diagnosis not present

## 2019-02-03 DIAGNOSIS — O119 Pre-existing hypertension with pre-eclampsia, unspecified trimester: Secondary | ICD-10-CM

## 2019-02-03 DIAGNOSIS — O113 Pre-existing hypertension with pre-eclampsia, third trimester: Secondary | ICD-10-CM | POA: Insufficient documentation

## 2019-02-03 DIAGNOSIS — Z7982 Long term (current) use of aspirin: Secondary | ICD-10-CM | POA: Diagnosis not present

## 2019-02-03 DIAGNOSIS — Z79899 Other long term (current) drug therapy: Secondary | ICD-10-CM | POA: Insufficient documentation

## 2019-02-03 LAB — URINALYSIS, ROUTINE W REFLEX MICROSCOPIC
Bilirubin Urine: NEGATIVE
Glucose, UA: NEGATIVE mg/dL
Hgb urine dipstick: NEGATIVE
Ketones, ur: NEGATIVE mg/dL
Nitrite: NEGATIVE
Protein, ur: 100 mg/dL — AB
Specific Gravity, Urine: 1.03 (ref 1.005–1.030)
pH: 6 (ref 5.0–8.0)

## 2019-02-03 LAB — COMPREHENSIVE METABOLIC PANEL
ALT: 21 U/L (ref 0–44)
AST: 19 U/L (ref 15–41)
Albumin: 2.4 g/dL — ABNORMAL LOW (ref 3.5–5.0)
Alkaline Phosphatase: 123 U/L (ref 38–126)
Anion gap: 9 (ref 5–15)
BUN: 5 mg/dL — ABNORMAL LOW (ref 6–20)
CO2: 21 mmol/L — ABNORMAL LOW (ref 22–32)
Calcium: 8.6 mg/dL — ABNORMAL LOW (ref 8.9–10.3)
Chloride: 105 mmol/L (ref 98–111)
Creatinine, Ser: 0.5 mg/dL (ref 0.44–1.00)
GFR calc Af Amer: >60 mL/min (ref >60)
GFR calc non Af Amer: >60 mL/min (ref >60)
Glucose, Bld: 102 mg/dL — ABNORMAL HIGH (ref 70–99)
Potassium: 3.4 mmol/L — ABNORMAL LOW (ref 3.5–5.1)
Sodium: 135 mmol/L (ref 135–145)
Total Bilirubin: 0.5 mg/dL (ref 0.3–1.2)
Total Protein: 6 g/dL — ABNORMAL LOW (ref 6.5–8.1)

## 2019-02-03 LAB — PROTEIN / CREATININE RATIO, URINE
Creatinine, Urine: 296.64 mg/dL
Protein Creatinine Ratio: 0.61 mg/mg{Cre} — ABNORMAL HIGH (ref 0.00–0.15)
Total Protein, Urine: 182 mg/dL

## 2019-02-03 LAB — CBC
HCT: 26.3 % — ABNORMAL LOW (ref 36.0–46.0)
Hemoglobin: 7.9 g/dL — ABNORMAL LOW (ref 12.0–15.0)
MCH: 20.7 pg — ABNORMAL LOW (ref 26.0–34.0)
MCHC: 30 g/dL (ref 30.0–36.0)
MCV: 69 fL — ABNORMAL LOW (ref 80.0–100.0)
Platelets: 381 10*3/uL (ref 150–400)
RBC: 3.81 MIL/uL — ABNORMAL LOW (ref 3.87–5.11)
RDW: 17.4 % — ABNORMAL HIGH (ref 11.5–15.5)
WBC: 8.2 10*3/uL (ref 4.0–10.5)
nRBC: 0.2 % (ref 0.0–0.2)

## 2019-02-03 NOTE — Discharge Instructions (Signed)
24-Hour Urine Collection A 24-hour urine specimen is a lab test that requires collection of all your urine for an entire day. This is sometimes called a timed urine test. It can provide more information than a single urine sample. There are many reasons to have this test. Your health care provider may order the test to check for or monitor the following conditions:  High blood pressure.  Kidney disease.  Kidney stones.  Urinary tract infections.  Pregnancy.  Diabetes. How do I prepare for this test?  You may be asked to follow a special diet during or before the collection period. Follow any instructions from your health care provider. If no special instructions are given, you may eat and drink normally.  Take over-the-counter and prescription medicines only as told by your health care provider.  Let your health care provider know about any medicines that you are taking, including over-the-counter medicines, vitamins, herbs, and supplements.  Choose a collection day when you can be at home or when you have a place to store the urine. All urine must be collected during the testing period. How do I do a 24-hour urine collection?   When you get up in the morning, urinate in the toilet and flush. Write down the time. This will be your start time on the day of collection and your end time on the next morning.  From the start time on, all of your urine should be kept in the collection jug that you received from the lab.  If the jug that is given to you already has liquid in it, that is okay. Do not throw out the liquid or rinse out the jug. Some tests need the liquid to be added to your urine.  Urinate into a specimen container, such as a urinal or pan that sits over the toilet. Pour the urine from the container into the collection jug. Be careful not to spill any of the urine. Use the equipment provided by the lab.  Do not let any toilet paper or stool (feces) get into the jug. This  will contaminate the sample.  Stop collecting your urine 24 hours after you started. Collect the last specimen as close as possible to the end of the 24-hour period.  Keep the jug cool in an ice chest or keep it in the refrigerator during collection.  When the 24-hour collection is complete, bring the jug to the lab. Keep the jug cool in an ice chest while you are bringing it to the lab. What do the results mean? Talk with your health care provider about what your results mean. Questions to ask your health care provider Ask your health care provider, or the department that is doing the test:  When will my results be ready?  How will I get my results?  What are my treatment options?  What other tests do I need?  What are my next steps? Summary  A 24-hour urine specimen is a lab test that requires collection of all your urine for an entire day.  When you get up in the morning, urinate in the toilet and flush. Write down the time. For the next 24 hours, collect all of your urine in the collection jug that you received from the lab.  Keep the jug cool while collecting the urine and while bringing it back to the lab.  Take the jug of urine back to the lab as soon as possible after the collection period has ended. This information is  not intended to replace advice given to you by your health care provider. Make sure you discuss any questions you have with your health care provider. Document Revised: 10/02/2018 Document Reviewed: 01/22/2017 Elsevier Patient Education  [place]. Preeclampsia and Eclampsia Preeclampsia is a serious condition that may develop during pregnancy. This condition causes high blood pressure and increased protein in your urine along with other symptoms, such as headaches and vision changes. These symptoms may develop as the condition gets worse. Preeclampsia may occur at 20 weeks of pregnancy or later. Diagnosing and treating preeclampsia early is very  important. If not treated early, it can cause serious problems for you and your baby. One problem it can lead to is eclampsia. Eclampsia is a condition that causes muscle jerking or shaking (convulsions or seizures) and other serious problems for the mother. During pregnancy, delivering your baby may be the best treatment for preeclampsia or eclampsia. For most women, preeclampsia and eclampsia symptoms go away after giving birth. In rare cases, a woman may develop preeclampsia after giving birth (postpartum preeclampsia). This usually occurs within 48 hours after childbirth but may occur up to 6 weeks after giving birth. What are the causes? The cause of preeclampsia is not known. What increases the risk? The following risk factors make you more likely to develop preeclampsia:  Being pregnant for the first time.  Having had preeclampsia during a past pregnancy.  Having a family history of preeclampsia.  Having high blood pressure.  Being pregnant with more than one baby.  Being 79 or older.  Being African-American.  Having kidney disease or diabetes.  Having medical conditions such as lupus or blood diseases.  Being very overweight (obese). What are the signs or symptoms? The most common symptoms are:  Severe headaches.  Vision problems, such as blurred or double vision.  Abdominal pain, especially upper abdominal pain. Other symptoms that may develop as the condition gets worse include:  Sudden weight gain.  Sudden swelling of the hands, face, legs, and feet.  Severe nausea and vomiting.  Numbness in the face, arms, legs, and feet.  Dizziness.  Urinating less than usual.  Slurred speech.  Convulsions or seizures. How is this diagnosed? There are no screening tests for preeclampsia. Your health care provider will ask you about symptoms and check for signs of preeclampsia during your prenatal visits. You may also have tests that include:  Checking your blood  pressure.  Urine tests to check for protein. Your health care provider will check for this at every prenatal visit.  Blood tests.  Monitoring your baby's heart rate.  Ultrasound. How is this treated? You and your health care provider will determine the treatment approach that is best for you. Treatment may include:  Having more frequent prenatal exams to check for signs of preeclampsia, if you have an increased risk for preeclampsia.  Medicine to lower your blood pressure.  Staying in the hospital, if your condition is severe. There, treatment will focus on controlling your blood pressure and the amount of fluids in your body (fluid retention).  Taking medicine (magnesium sulfate) to prevent seizures. This may be given as an injection or through an IV.  Taking a low-dose aspirin during your pregnancy.  Delivering your baby early. You may have your labor started with medicine (induced), or you may have a cesarean delivery. Follow these instructions at home: Eating and drinking   Drink enough fluid to keep your urine pale yellow.  Avoid caffeine. Lifestyle  Do not  use any products that contain nicotine or tobacco, such as cigarettes and e-cigarettes. If you need help quitting, ask your health care provider.  Do not use alcohol or drugs.  Avoid stress as much as possible. Rest and get plenty of sleep. General instructions  Take over-the-counter and prescription medicines only as told by your health care provider.  When lying down, lie on your left side. This keeps pressure off your major blood vessels.  When sitting or lying down, raise (elevate) your feet. Try putting some pillows underneath your lower legs.  Exercise regularly. Ask your health care provider what kinds of exercise are best for you.  Keep all follow-up and prenatal visits as told by your health care provider. This is important. How is this prevented? There is no known way of preventing preeclampsia or  eclampsia from developing. However, to lower your risk of complications and detect problems early:  Get regular prenatal care. Your health care provider may be able to diagnose and treat the condition early.  Maintain a healthy weight. Ask your health care provider for help managing weight gain during pregnancy.  Work with your health care provider to manage any long-term (chronic) health conditions you have, such as diabetes or kidney problems.  You may have tests of your blood pressure and kidney function after giving birth.  Your health care provider may have you take low-dose aspirin during your next pregnancy. Contact a health care provider if:  You have symptoms that your health care provider told you may require more treatment or monitoring, such as: ? Headaches. ? Nausea or vomiting. ? Abdominal pain. ? Dizziness. ? Light-headedness. Get help right away if:  You have severe: ? Abdominal pain. ? Headaches that do not get better. ? Dizziness. ? Vision problems. ? Confusion. ? Nausea or vomiting.  You have any of the following: ? A seizure. ? Sudden, rapid weight gain. ? Sudden swelling in your hands, ankles, or face. ? Trouble moving any part of your body. ? Numbness in any part of your body. ? Trouble speaking. ? Abnormal bleeding.  You faint. Summary  Preeclampsia is a serious condition that may develop during pregnancy.  This condition causes high blood pressure and increased protein in your urine along with other symptoms, such as headaches and vision changes.  Diagnosing and treating preeclampsia early is very important. If not treated early, it can cause serious problems for you and your baby.  Get help right away if you have symptoms that your health care provider told you to watch for. This information is not intended to replace advice given to you by your health care provider. Make sure you discuss any questions you have with your health care  provider. Document Revised: 09/11/2017 Document Reviewed: 08/16/2015 Elsevier Patient Education  [place].

## 2019-02-03 NOTE — MAU Provider Note (Signed)
History     CSN: [id number]  Arrival date and time: 02/03/19 1506   First Provider Initiated Contact with Patient 02/03/19 1604      Chief Complaint  Patient presents with  . BP Evaluation   HPI   [name] is a 34 y.o. [phone] @ [redacted]w[redacted]d with a history of cHTN not on medication here with elevated BP readings at home. States she delivered her last two pregnancies d/t severe preeclampsia @ 37 weeks. She denies HA or scotoma. States she takes ASA daily.   Baseline PCR 0.2 and recently PCR 0.4.   OB History    Gravida  4   Para  2   Term  2   Preterm  0   AB  1   Living  2     SAB  0   TAB      Ectopic      Multiple  0   Live Births  2           Past Medical History:  Diagnosis Date  . Abnormal Pap smear 2012   colpo result benign  . Pregnancy induced hypertension   . Vaginal Pap smear, abnormal     Past Surgical History:  Procedure Laterality Date  . WISDOM TOOTH EXTRACTION      Family History  Problem Relation Age of Onset  . Heart disease Father   . Hypertension Father     Social History   Tobacco Use  . Smoking status: Never Smoker  . Smokeless tobacco: Never Used  Substance Use Topics  . Alcohol use: No  . Drug use: No    Allergies: No Known Allergies  Medications Prior to Admission  Medication Sig Dispense Refill Last Dose  . aspirin 81 MG chewable tablet Chew 81 mg by mouth daily.   01/25/2019 at 2100  . Doxylamine-Pyridoxine (DICLEGIS PO) Take by mouth.   02/02/2019 at 2100  . ondansetron (ZOFRAN-ODT) 4 MG disintegrating tablet Take 4 mg by mouth every 8 (eight) hours as needed for nausea or vomiting.   Past Month at Unknown time  . pantoprazole (PROTONIX) 40 MG tablet Take 1 tablet (40 mg total) by mouth at bedtime. 30 tablet 5 02/02/2019 at 2100  . Prenatal MV-Min-FA-Omega-3 (PRENATAL GUMMIES/DHA & FA) 0.4-32.5 MG CHEW Chew 2 each by mouth at bedtime.   02/02/2019 at 0800  . glycopyrrolate (ROBINUL) 2 MG tablet Take 1 tablet  (2 mg total) by mouth 3 (three) times daily as needed. (Patient not taking: Reported on 01/27/2019) 30 tablet 3   . metoCLOPramide (REGLAN) 10 MG tablet Take 1 tablet (10 mg total) by mouth every 6 (six) hours. (Patient not taking: Reported on 01/27/2019) 120 tablet 1   . ondansetron (ZOFRAN) 4 MG tablet Take 4 mg by mouth every 8 (eight) hours as needed for nausea or vomiting.   More than a month at Unknown time  . promethazine (PHENERGAN) 25 MG suppository Place 1 suppository (25 mg total) rectally every 6 (six) hours as needed for nausea or vomiting. (Patient not taking: Reported on 01/27/2019) 12 each 2   . promethazine (PHENERGAN) 25 MG tablet Take 1 tablet (25 mg total) by mouth every 6 (six) hours as needed for nausea or vomiting. (Patient not taking: Reported on 01/27/2019) 30 tablet 2    Results for orders placed or performed during the hospital encounter of 02/03/19 (from the past 48 hour(s))  Urinalysis, Routine w reflex microscopic     Status: Abnormal  Collection Time: 02/03/19  3:55 PM  Result Value Ref Range   Color, Urine YELLOW YELLOW   APPearance HAZY (A) CLEAR   Specific Gravity, Urine 1.030 1.005 - 1.030   pH 6.0 5.0 - 8.0   Glucose, UA NEGATIVE NEGATIVE mg/dL   Hgb urine dipstick NEGATIVE NEGATIVE   Bilirubin Urine NEGATIVE NEGATIVE   Ketones, ur NEGATIVE NEGATIVE mg/dL   Protein, ur 100 (A) NEGATIVE mg/dL   Nitrite NEGATIVE NEGATIVE   Leukocytes,Ua SMALL (A) NEGATIVE   RBC / HPF 0-5 0 - 5 RBC/hpf   WBC, UA 6-10 0 - 5 WBC/hpf   Bacteria, UA FEW (A) NONE SEEN   Squamous Epithelial / LPF 11-20 0 - 5   Mucus PRESENT    Ca Oxalate Crys, UA PRESENT     Comment: Performed at [facility] [address], [place], [place] [address]  Protein / creatinine ratio, urine     Status: Abnormal   Collection Time: 02/03/19  3:55 PM  Result Value Ref Range   Creatinine, Urine 296.64 mg/dL   Total Protein, Urine 182 mg/dL    Comment: NO NORMAL RANGE ESTABLISHED FOR THIS  TEST RESULTS CONFIRMED BY MANUAL DILUTION    Protein Creatinine Ratio 0.61 (H) 0.00 - 0.15 mg/mg[Cre]    Comment: Performed at [facility] [address] [place], [place] [address]  CBC     Status: Abnormal   Collection Time: 02/03/19  4:12 PM  Result Value Ref Range   WBC 8.2 4.0 - 10.5 K/uL   RBC 3.81 (L) 3.87 - 5.11 MIL/uL   Hemoglobin 7.9 (L) 12.0 - 15.0 g/dL    Comment: Reticulocyte Hemoglobin testing may be clinically indicated, consider ordering this additional test [id number]    HCT 26.3 (L) 36.0 - 46.0 %   MCV 69.0 (L) 80.0 - 100.0 fL   MCH 20.7 (L) 26.0 - 34.0 pg   MCHC 30.0 30.0 - 36.0 g/dL   RDW 17.4 (H) 11.5 - 15.5 %   Platelets 381 150 - 400 K/uL    Comment: REPEATED TO VERIFY   nRBC 0.2 0.0 - 0.2 %    Comment: Performed at [facility], [place] [address] [place], [place] [address]  Comprehensive metabolic panel     Status: Abnormal   Collection Time: 02/03/19  4:12 PM  Result Value Ref Range   Sodium 135 135 - 145 mmol/L   Potassium 3.4 (L) 3.5 - 5.1 mmol/L   Chloride 105 98 - 111 mmol/L   CO2 21 (L) 22 - 32 mmol/L   Glucose, Bld 102 (H) 70 - 99 mg/dL   BUN 5 (L) 6 - 20 mg/dL   Creatinine, Ser 0.50 0.44 - 1.00 mg/dL   Calcium 8.6 (L) 8.9 - 10.3 mg/dL   Total Protein 6.0 (L) 6.5 - 8.1 g/dL   Albumin 2.4 (L) 3.5 - 5.0 g/dL   AST 19 15 - 41 U/L   ALT 21 0 - 44 U/L   Alkaline Phosphatase 123 38 - 126 U/L   Total Bilirubin 0.5 0.3 - 1.2 mg/dL   GFR calc non Af Amer >60 >60 mL/min   GFR calc Af Amer >60 >60 mL/min   Anion gap 9 5 - 15    Comment: Performed at [facility], [place] [address] [place], [place] [address]   Review of Systems  Constitutional: Negative for fever.  Eyes: Negative for photophobia and visual disturbance.  Gastrointestinal: Negative for abdominal pain.  Neurological: Negative for headaches.   Physical Exam   Blood pressure (!) 147/85, pulse 92, temperature 98.4 F (36.9 C), temperature source Oral, resp. rate 19,  height 5\' 4"  (1.626 m), weight 132.7 kg, last menstrual period 06/18/2018, SpO2 99 %, unknown if currently breastfeeding.   Patient Vitals for the past 24 hrs:  BP Temp Temp src Pulse Resp SpO2 Height Weight  02/03/19 1800 122/76 -- -- 98 -- -- -- --  02/03/19 1745 (!) 127/51 -- -- 92 -- -- -- --  02/03/19 1735 131/61 -- -- 95 -- -- -- --  02/03/19 1715 (!) 151/87 -- -- 96 -- -- -- --  02/03/19 1705 (!) 147/85 -- -- 92 -- -- -- --  02/03/19 1630 (!) 147/94 -- -- 97 -- -- -- --  02/03/19 1615 139/85 -- -- 93 -- -- -- --  02/03/19 1600 (!) 146/91 -- -- 96 -- -- -- --  02/03/19 1546 (!) 148/76 -- -- 89 -- -- -- --  02/03/19 1540 (!) 147/93 -- -- (!) 108 -- -- -- --  02/03/19 1527 (!) 151/96 98.4 F (36.9 C) Oral (!) 105 19 99 % -- --  02/03/19 1515 -- -- -- -- -- -- 5\' 4"  (1.626 m) 132.7 kg   Physical Exam  Constitutional: She is oriented to person, place, and time. She appears well-developed and well-nourished. No distress.  HENT:  Head: Normocephalic.  Respiratory: Effort normal and breath sounds normal.  GI: Soft. She exhibits no distension. There is no abdominal tenderness. There is no rebound and no guarding.  Musculoskeletal:        General: Normal range of motion.  Neurological: She is alert and oriented to person, place, and time. She has normal reflexes. She displays normal reflexes.  Negative clonus   Skin: Skin is warm. She is not diaphoretic.  Psychiatric: Her behavior is normal.   Fetal Tracing: Baseline: 135 bpm Variability: Moderate  Accelerations: 15x15 Decelerations: None Toco: None  MAU Course  Procedures  None  MDM  PIH labs collected  PCR increasing from baseline. Dr. Nelda Marseille called the MAU to check on the patient. She recommended the patient go home with a 24 hour Urine.  She is scheduled to be seen tomorrow.  Blood pressures not severe range today and she is asymptomatic  Anemia: She is taking integra Iron supplements.   Assessment and Plan    A:  1. Chronic hypertension with superimposed pre-eclampsia   2. [redacted] weeks gestation of pregnancy     P:  Discharge home with strict return precautions Go to the office tomorrow as scheduled Return to MAU if any symptoms arise.  24 hour Urine sent home with patient Preeclampsia precautions reviewed  Lezlie Lye, NP 02/03/2019 7:17 PM

## 2019-02-03 NOTE — MAU Note (Signed)
Presents for BP evaluation.  Checked BP @ home, BP 160/110, called MD office advised to be seen in MAU.  Denies H/A, visual disturbances or epigastric pain.  Endorses +FM.  Denies VB or LOF.

## 2019-02-05 ENCOUNTER — Other Ambulatory Visit: Payer: Self-pay | Admitting: Obstetrics and Gynecology

## 2019-02-05 LAB — CULTURE, OB URINE

## 2019-02-06 ENCOUNTER — Other Ambulatory Visit (HOSPITAL_COMMUNITY): Payer: Self-pay | Admitting: *Deleted

## 2019-02-07 ENCOUNTER — Other Ambulatory Visit: Payer: Self-pay

## 2019-02-07 ENCOUNTER — Ambulatory Visit (HOSPITAL_COMMUNITY)
Admission: RE | Admit: 2019-02-07 | Discharge: 2019-02-07 | Disposition: A | Discharge status: Home / Self Care | Payer: BC Managed Care – PPO | Source: Ambulatory Visit | Attending: Obstetrics and Gynecology | Admitting: Obstetrics and Gynecology

## 2019-02-07 DIAGNOSIS — O99019 Anemia complicating pregnancy, unspecified trimester: Secondary | ICD-10-CM | POA: Diagnosis not present

## 2019-02-07 DIAGNOSIS — D509 Iron deficiency anemia, unspecified: Secondary | ICD-10-CM | POA: Diagnosis not present

## 2019-02-07 MED ORDER — SODIUM CHLORIDE 0.9 % IV SOLN
510.0000 mg | INTRAVENOUS | Status: DC
  Administered 2019-02-07: 510 mg via INTRAVENOUS
  Filled 2019-02-07: qty 17

## 2019-02-07 NOTE — Discharge Instructions (Signed)

## 2019-02-14 ENCOUNTER — Encounter (HOSPITAL_COMMUNITY): Payer: BC Managed Care – PPO

## 2019-02-18 ENCOUNTER — Other Ambulatory Visit (HOSPITAL_COMMUNITY): Payer: Self-pay | Admitting: *Deleted

## 2019-02-19 ENCOUNTER — Telehealth (HOSPITAL_COMMUNITY): Payer: Self-pay | Admitting: *Deleted

## 2019-02-19 ENCOUNTER — Encounter (HOSPITAL_COMMUNITY)
Admission: RE | Admit: 2019-02-19 | Discharge: 2019-02-19 | Disposition: A | Discharge status: Home / Self Care | Payer: BC Managed Care – PPO | Source: Ambulatory Visit | Attending: Obstetrics and Gynecology | Admitting: Obstetrics and Gynecology

## 2019-02-19 ENCOUNTER — Encounter (HOSPITAL_COMMUNITY): Payer: Self-pay | Admitting: *Deleted

## 2019-02-19 ENCOUNTER — Other Ambulatory Visit: Payer: Self-pay

## 2019-02-19 DIAGNOSIS — D649 Anemia, unspecified: Secondary | ICD-10-CM | POA: Diagnosis not present

## 2019-02-19 DIAGNOSIS — O99013 Anemia complicating pregnancy, third trimester: Secondary | ICD-10-CM | POA: Diagnosis present

## 2019-02-19 MED ORDER — SODIUM CHLORIDE 0.9 % IV SOLN
510.0000 mg | INTRAVENOUS | Status: DC
  Administered 2019-02-19: 510 mg via INTRAVENOUS
  Filled 2019-02-19: qty 17

## 2019-02-19 NOTE — Telephone Encounter (Signed)
Preadmission screen  

## 2019-02-24 ENCOUNTER — Other Ambulatory Visit (HOSPITAL_COMMUNITY)
Admission: RE | Admit: 2019-02-24 | Discharge: 2019-02-24 | Disposition: A | Discharge status: Home / Self Care | Payer: BC Managed Care – PPO | Source: Ambulatory Visit | Attending: Obstetrics and Gynecology | Admitting: Obstetrics and Gynecology

## 2019-02-24 ENCOUNTER — Other Ambulatory Visit: Payer: Self-pay | Admitting: Obstetrics and Gynecology

## 2019-02-24 DIAGNOSIS — Z01812 Encounter for preprocedural laboratory examination: Secondary | ICD-10-CM | POA: Insufficient documentation

## 2019-02-24 DIAGNOSIS — Z20822 Contact with and (suspected) exposure to covid-19: Secondary | ICD-10-CM | POA: Insufficient documentation

## 2019-02-24 LAB — SARS CORONAVIRUS 2 (TAT 6-24 HRS): SARS Coronavirus 2: NEGATIVE

## 2019-02-25 ENCOUNTER — Other Ambulatory Visit: Payer: Self-pay

## 2019-02-25 ENCOUNTER — Encounter (HOSPITAL_COMMUNITY): Payer: Self-pay | Admitting: Obstetrics and Gynecology

## 2019-02-25 ENCOUNTER — Inpatient Hospital Stay (HOSPITAL_COMMUNITY)
Admission: AD | Admit: 2019-02-25 | Discharge: 2019-02-28 | DRG: 807 | Disposition: A | Discharge status: Home / Self Care | Payer: BC Managed Care – PPO | Attending: Obstetrics and Gynecology | Admitting: Obstetrics and Gynecology

## 2019-02-25 ENCOUNTER — Inpatient Hospital Stay (HOSPITAL_COMMUNITY): Payer: BC Managed Care – PPO | Admitting: Anesthesiology

## 2019-02-25 DIAGNOSIS — Z6791 Unspecified blood type, Rh negative: Secondary | ICD-10-CM | POA: Diagnosis not present

## 2019-02-25 DIAGNOSIS — O9902 Anemia complicating childbirth: Secondary | ICD-10-CM | POA: Diagnosis present

## 2019-02-25 DIAGNOSIS — O1002 Pre-existing essential hypertension complicating childbirth: Secondary | ICD-10-CM | POA: Diagnosis present

## 2019-02-25 DIAGNOSIS — D649 Anemia, unspecified: Secondary | ICD-10-CM | POA: Diagnosis present

## 2019-02-25 DIAGNOSIS — O99214 Obesity complicating childbirth: Secondary | ICD-10-CM | POA: Diagnosis present

## 2019-02-25 DIAGNOSIS — Z3A36 36 weeks gestation of pregnancy: Secondary | ICD-10-CM

## 2019-02-25 DIAGNOSIS — O99824 Streptococcus B carrier state complicating childbirth: Secondary | ICD-10-CM | POA: Diagnosis present

## 2019-02-25 DIAGNOSIS — O114 Pre-existing hypertension with pre-eclampsia, complicating childbirth: Secondary | ICD-10-CM | POA: Diagnosis present

## 2019-02-25 DIAGNOSIS — O1413 Severe pre-eclampsia, third trimester: Secondary | ICD-10-CM

## 2019-02-25 DIAGNOSIS — O26893 Other specified pregnancy related conditions, third trimester: Secondary | ICD-10-CM | POA: Diagnosis present

## 2019-02-25 DIAGNOSIS — Z20822 Contact with and (suspected) exposure to covid-19: Secondary | ICD-10-CM | POA: Diagnosis present

## 2019-02-25 HISTORY — DX: Chlamydial infection, unspecified: A74.9

## 2019-02-25 HISTORY — DX: Severe pre-eclampsia, third trimester: O14.13

## 2019-02-25 HISTORY — DX: Papillomavirus as the cause of diseases classified elsewhere: B97.7

## 2019-02-25 LAB — CBC
HCT: 31.8 % — ABNORMAL LOW (ref 36.0–46.0)
Hemoglobin: 9.5 g/dL — ABNORMAL LOW (ref 12.0–15.0)
MCH: 22.5 pg — ABNORMAL LOW (ref 26.0–34.0)
MCHC: 29.9 g/dL — ABNORMAL LOW (ref 30.0–36.0)
MCV: 75.2 fL — ABNORMAL LOW (ref 80.0–100.0)
Platelets: 424 10*3/uL — ABNORMAL HIGH (ref 150–400)
RBC: 4.23 MIL/uL (ref 3.87–5.11)
RDW: 27.4 % — ABNORMAL HIGH (ref 11.5–15.5)
WBC: 8 10*3/uL (ref 4.0–10.5)
nRBC: 0 % (ref 0.0–0.2)

## 2019-02-25 LAB — COMPREHENSIVE METABOLIC PANEL
ALT: 18 U/L (ref 0–44)
AST: 23 U/L (ref 15–41)
Albumin: 2.5 g/dL — ABNORMAL LOW (ref 3.5–5.0)
Alkaline Phosphatase: 142 U/L — ABNORMAL HIGH (ref 38–126)
Anion gap: 12 (ref 5–15)
BUN: <5 mg/dL — ABNORMAL LOW (ref 6–20)
CO2: 19 mmol/L — ABNORMAL LOW (ref 22–32)
Calcium: 8.8 mg/dL — ABNORMAL LOW (ref 8.9–10.3)
Chloride: 105 mmol/L (ref 98–111)
Creatinine, Ser: 0.52 mg/dL (ref 0.44–1.00)
GFR calc Af Amer: >60 mL/min (ref >60)
GFR calc non Af Amer: >60 mL/min (ref >60)
Glucose, Bld: 90 mg/dL (ref 70–99)
Potassium: 3.5 mmol/L (ref 3.5–5.1)
Sodium: 136 mmol/L (ref 135–145)
Total Bilirubin: 0.3 mg/dL (ref 0.3–1.2)
Total Protein: 6.3 g/dL — ABNORMAL LOW (ref 6.5–8.1)

## 2019-02-25 MED ORDER — LACTATED RINGERS IV SOLN: 500.0000 mL | Freq: Once | INTRAVENOUS | Status: DC

## 2019-02-25 MED ORDER — ONDANSETRON HCL 4 MG/2ML IJ SOLN: 4.0000 mg | Freq: Four times a day (QID) | INTRAMUSCULAR | Status: DC | PRN

## 2019-02-25 MED ORDER — SOD CITRATE-CITRIC ACID 500-334 MG/5ML PO SOLN: 30.0000 mL | ORAL | Status: DC | PRN

## 2019-02-25 MED ORDER — LABETALOL HCL 5 MG/ML IV SOLN: 20.0000 mg | INTRAVENOUS | Status: DC | PRN

## 2019-02-25 MED ORDER — OXYTOCIN 40 UNITS IN NORMAL SALINE INFUSION - SIMPLE MED
2.5000 [IU]/h | INTRAVENOUS | Status: DC
  Filled 2019-02-25: qty 1000

## 2019-02-25 MED ORDER — LIDOCAINE HCL (PF) 1 % IJ SOLN: 30.0000 mL | INTRAMUSCULAR | Status: DC | PRN

## 2019-02-25 MED ORDER — SODIUM CHLORIDE 0.9 % IV SOLN
5.0000 10*6.[IU] | Freq: Once | INTRAVENOUS | Status: AC
  Administered 2019-02-25: 5 10*6.[IU] via INTRAVENOUS
  Filled 2019-02-25: qty 5

## 2019-02-25 MED ORDER — LACTATED RINGERS IV SOLN: INTRAVENOUS | Status: DC

## 2019-02-25 MED ORDER — LABETALOL HCL 5 MG/ML IV SOLN: 80.0000 mg | INTRAVENOUS | Status: DC | PRN

## 2019-02-25 MED ORDER — FENTANYL-BUPIVACAINE-NACL 0.5-0.125-0.9 MG/250ML-% EP SOLN
12.0000 mL/h | EPIDURAL | Status: DC | PRN
  Filled 2019-02-25: qty 250

## 2019-02-25 MED ORDER — FENTANYL CITRATE (PF) 100 MCG/2ML IJ SOLN: 50.0000 ug | INTRAMUSCULAR | Status: DC | PRN

## 2019-02-25 MED ORDER — DIPHENHYDRAMINE HCL 50 MG/ML IJ SOLN
12.5000 mg | INTRAMUSCULAR | Status: DC | PRN
  Administered 2019-02-26 (×2): 12.5 mg via INTRAVENOUS
  Filled 2019-02-25: qty 1

## 2019-02-25 MED ORDER — OXYCODONE-ACETAMINOPHEN 5-325 MG PO TABS: 2.0000 | ORAL_TABLET | ORAL | Status: DC | PRN

## 2019-02-25 MED ORDER — EPHEDRINE 5 MG/ML INJ: 10.0000 mg | INTRAVENOUS | Status: DC | PRN

## 2019-02-25 MED ORDER — OXYTOCIN BOLUS FROM INFUSION
500.0000 mL | Freq: Once | INTRAVENOUS | Status: AC
  Administered 2019-02-26: 500 mL via INTRAVENOUS

## 2019-02-25 MED ORDER — LACTATED RINGERS IV SOLN
500.0000 mL | Freq: Once | INTRAVENOUS | Status: AC
  Administered 2019-02-25: 500 mL via INTRAVENOUS

## 2019-02-25 MED ORDER — ACETAMINOPHEN 325 MG PO TABS: 650.0000 mg | ORAL_TABLET | ORAL | Status: DC | PRN

## 2019-02-25 MED ORDER — ZOLPIDEM TARTRATE 5 MG PO TABS: 5.0000 mg | ORAL_TABLET | Freq: Every evening | ORAL | Status: DC | PRN

## 2019-02-25 MED ORDER — LIDOCAINE HCL (PF) 1 % IJ SOLN
INTRAMUSCULAR | Status: DC | PRN
  Administered 2019-02-25 (×2): 4 mL via EPIDURAL

## 2019-02-25 MED ORDER — LABETALOL HCL 5 MG/ML IV SOLN: 40.0000 mg | INTRAVENOUS | Status: DC | PRN

## 2019-02-25 MED ORDER — PENICILLIN G POT IN DEXTROSE 60000 UNIT/ML IV SOLN
3.0000 10*6.[IU] | INTRAVENOUS | Status: DC
  Administered 2019-02-25 – 2019-02-26 (×4): 3 10*6.[IU] via INTRAVENOUS
  Filled 2019-02-25 (×4): qty 50

## 2019-02-25 MED ORDER — HYDRALAZINE HCL 20 MG/ML IJ SOLN: 10.0000 mg | INTRAMUSCULAR | Status: DC | PRN

## 2019-02-25 MED ORDER — LACTATED RINGERS IV SOLN
500.0000 mL | INTRAVENOUS | Status: DC | PRN
  Administered 2019-02-26: 500 mL via INTRAVENOUS

## 2019-02-25 MED ORDER — OXYCODONE-ACETAMINOPHEN 5-325 MG PO TABS: 1.0000 | ORAL_TABLET | ORAL | Status: DC | PRN

## 2019-02-25 MED ORDER — OXYTOCIN 40 UNITS IN NORMAL SALINE INFUSION - SIMPLE MED
1.0000 m[IU]/min | INTRAVENOUS | Status: DC
  Administered 2019-02-25: 2 m[IU]/min via INTRAVENOUS

## 2019-02-25 MED ORDER — SODIUM CHLORIDE (PF) 0.9 % IJ SOLN
INTRAMUSCULAR | Status: DC | PRN
  Administered 2019-02-25: 12 mL/h via EPIDURAL

## 2019-02-25 MED ORDER — PHENYLEPHRINE 40 MCG/ML (10ML) SYRINGE FOR IV PUSH (FOR BLOOD PRESSURE SUPPORT): 80.0000 ug | PREFILLED_SYRINGE | INTRAVENOUS | Status: DC | PRN

## 2019-02-25 MED ORDER — TERBUTALINE SULFATE 1 MG/ML IJ SOLN: 0.2500 mg | Freq: Once | INTRAMUSCULAR | Status: DC | PRN

## 2019-02-25 MED ORDER — FLEET ENEMA 7-19 GM/118ML RE ENEM: 1.0000 | ENEMA | RECTAL | Status: DC | PRN

## 2019-02-25 NOTE — Progress Notes (Signed)
Evelyn Marshall is a 34 y.o. [id number] at [redacted]w[redacted]d   Subjective: Pt is starting to feel contractions but does not want any pain medications.  Denies headaches, visual changes, upper abdominal pain.  Objective: BP (!) 151/87   Pulse 82   Temp 98.1 F (36.7 C) (Oral)   Resp 18   Ht 5\' 4"  (1.626 m)   Wt 135.4 kg   LMP 06/18/2018   BMI 51.25 kg/m  No intake/output data recorded. No intake/output data recorded.  FHT:  FHR: 130s bpm, variability: moderate,  accelerations:  Present,  decelerations:  Absent UC:   regular, every 3-4 minutes SVE:   Dilation: 3.5 Effacement (%): 60 Station: -3 Exam by:: Yazan Gatling Ballotable  Labs: Lab Results  Component Value Date   WBC 8.0 02/25/2019   HGB 9.5 (L) 02/25/2019   HCT 31.8 (L) 02/25/2019   MCV 75.2 (L) 02/25/2019   PLT 424 (H) 02/25/2019    Assessment / Plan: IUP @ 36 6/7 weeks IOL due to [facility] with superimposed Preeclampsia with nonreassuring fetal testing.    BPP 4/8 in the office.  NST reactive upon arrival, now 6/10.  Labor: On 10 mUs of Pitocin.  Continue to increase. Preeclampsia:  BP mildly elevated in pt's normal range.  Labs negative for preeclampsia with severe featurs   Start Magnesium sulfate if Severe range BPs.  IV Labetalol prn. Fetal Wellbeing:  Category I Pain Control:  IV pain meds I/D:  GBS+.  S/p 1 dose of PCN. Anticipated MOD:  NSVD   Dr. Landry Mellow covering from present to 7 pm.  CCOB on after 7pm.  Pt and RN aware.  Thurnell Lose 02/25/2019, 3:37 PM

## 2019-02-25 NOTE — Anesthesia Preprocedure Evaluation (Signed)
Anesthesia Evaluation  Patient identified by MRN, date of birth, ID band Patient awake    Reviewed: Allergy & Precautions, Patient's Chart, lab work & pertinent test results  Airway Mallampati: III  TM Distance: >3 FB Neck ROM: Full    Dental no notable dental hx. (+) Teeth Intact   Pulmonary neg pulmonary ROS,    Pulmonary exam normal breath sounds clear to auscultation       Cardiovascular hypertension, Normal cardiovascular exam Rhythm:Regular Rate:Normal     Neuro/Psych negative neurological ROS  negative psych ROS   GI/Hepatic Neg liver ROS, Ptyalism   Endo/Other  Morbid obesitySuper MO  Renal/GU negative Renal ROS  negative genitourinary   Musculoskeletal negative musculoskeletal ROS (+)   Abdominal   Peds  Hematology  (+) anemia ,   Anesthesia Other Findings   Reproductive/Obstetrics (+) Pregnancy HPV                             Anesthesia Physical Anesthesia Plan  ASA: III  Anesthesia Plan: Epidural   Post-op Pain Management:    Induction:   PONV Risk Score and Plan:   Airway Management Planned: Natural Airway  Additional Equipment:   Intra-op Plan:   Post-operative Plan:   Informed Consent: I have reviewed the patients History and Physical, chart, labs and discussed the procedure including the risks, benefits and alternatives for the proposed anesthesia with the patient or authorized representative who has indicated his/her understanding and acceptance.       Plan Discussed with: Anesthesiologist  Anesthesia Plan Comments:         Anesthesia Quick Evaluation

## 2019-02-25 NOTE — Anesthesia Procedure Notes (Signed)
Epidural Patient location during procedure: OB Start time: 02/25/2019 11:32 PM End time: 02/25/2019 11:40 PM  Staffing Anesthesiologist: Josephine Igo, MD Performed: anesthesiologist   Preanesthetic Checklist Completed: patient identified, IV checked, site marked, risks and benefits discussed, surgical consent, monitors and equipment checked, pre-op evaluation and timeout performed  Epidural Patient position: sitting Prep: DuraPrep and site prepped and draped Patient monitoring: continuous pulse ox and blood pressure Approach: midline Location: L3-L4 Injection technique: LOR air  Needle:  Needle type: Tuohy  Needle gauge: 17 G Needle length: 9 cm and 9 Needle insertion depth: 9 cm Catheter type: closed end flexible Catheter size: 19 Gauge Catheter at skin depth: 14 cm Test dose: negative and Other  Assessment Events: blood not aspirated, injection not painful, no injection resistance, no paresthesia and negative IV test  Additional Notes Patient identified. Risks and benefits discussed including failed block, incomplete  Pain control, post dural puncture headache, nerve damage, paralysis, blood pressure Changes, nausea, vomiting, reactions to medications-both toxic and allergic and post Partum back pain. All questions were answered. Patient expressed understanding and wished to proceed. Sterile technique was used throughout procedure. Epidural site was Dressed with sterile barrier dressing. No paresthesias, signs of intravascular injection Or signs of intrathecal spread were encountered.  Patient was more comfortable after the epidural was dosed. Please see RN's note for documentation of vital signs and FHR which are stable. Reason for block:procedure for pain

## 2019-02-25 NOTE — Progress Notes (Signed)
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   Objective: BP (!) 146/91   Pulse 89   Temp 98.4 F (36.9 C) (Oral)   Resp 16   Ht 5\' 4"  (1.626 m)   Wt 135.4 kg   LMP 06/18/2018   SpO2 99%   BMI 51.25 kg/m   FHT:  FHR: 130s bpm, variability: moderate,  accelerations:  Present,  decelerations:  Absent UC:   regular, every 3-4 minutes SVE:   Dilation: 4 Effacement (%): 50, 60 Station: -3 Exam by:: Sia Johnny, RN Ballotable  Labs: Lab Results  Component Value Date   WBC 8.0 02/25/2019   HGB 9.5 (L) 02/25/2019   HCT 31.8 (L) 02/25/2019   MCV 75.2 (L) 02/25/2019   PLT 424 (H) 02/25/2019    Assessment / Plan: IUP @ 36 6/7 weeks IOL due to [facility] with superimposed Preeclampsia with nonreassuring fetal testing.    BPP 4/8 in the office.  NST reactive upon arrival, now 6/10.  Labor: Pitocin infusing, continue per protocol. AROM when presenting part applied and pt allows  Preeclampsia:  BP mildly elevated in pt's normal range.  Labs negative for preeclampsia with severe featurs   Start Magnesium sulfate if Severe range BPs.  IV Labetalol prn. Fetal Wellbeing:  Category I Pain Control:  IV pain meds I/D:  GBS pos, treated x 2 Anticipated MOD:  NSVD   Jenilyn Magana B Taniya Dasher 02/25/2019, 10:02 PM

## 2019-02-25 NOTE — H&P (Addendum)
Evelyn Marshall is a 34 y.o. female G4 P2012 @ 36 6/7 weeks presenting for IOL due to [facility] with superimposed Preeclampsia. Pt was scheduled for induction tomorrow.  BPP today in the office is 4/8 so recommended delivery today. Pt denies headaches, visual changes, upper abdominal pain.  Also denies contractions, bleeding or LOF. Trooper with [facility] has been complicated by:  CHTN diagnosed in the first trimester Superimposed Preeclampsia diagnosed at 34 weeks. Proteinuria since the first and second trimester. Hyperemesis Gravidarum Morbid Obesity Rh Neg, s/p Rhogam  OB History    Gravida  4   Para  2   Term  2   Preterm  0   AB  1   Living  2     SAB  0   TAB      Ectopic      Multiple  0   Live Births  2          Past Medical History:  Diagnosis Date  . Abnormal Pap smear 2012   colpo result benign  . Chlamydia   . History of pre-eclampsia   . HPV (human papilloma virus) infection   . Hyperemesis   . Hypokalemia   . Pregnancy induced hypertension   . Ptyalism   . Vaginal Pap smear, abnormal    Past Surgical History:  Procedure Laterality Date  . COLPOSCOPY    . WISDOM TOOTH EXTRACTION     Family History: family history includes Breast cancer in her paternal grandmother; Heart disease in her father; Hypertension in her father; Stroke in her father and paternal grandmother. Social History:  reports that she has never smoked. She has never used smokeless tobacco. She reports that she does not drink alcohol or use drugs.     Maternal Diabetes: No Genetic Screening: Normal Maternal Ultrasounds/Referrals: Normal Fetal Ultrasounds or other Referrals:  None Maternal Substance Abuse:  No Significant Maternal Medications:  None Significant Maternal Lab Results:  Group B Strep positive Other Comments:  CHTN with Superimposed Preeclampsia  Review of Systems  Respiratory: Negative for shortness of breath.   Gastrointestinal: Negative for abdominal pain.   Neurological: Negative for headaches.   Maternal Medical History:  Fetal activity: Pt reported fetal movement the morning of the BPP.  Prenatal complications: PIH and pre-eclampsia.   Prenatal Complications - Diabetes: none.    Dilation: 3 Effacement (%): 50 Station: -3 Exam by:: middleton rn Blood pressure (!) 155/87, pulse 93, temperature 98.1 F (36.7 C), temperature source Oral, resp. rate 18, height 5\' 4"  (1.626 m), weight 135.4 kg, last menstrual period 06/18/2018, unknown if currently breastfeeding. Maternal Exam:  Abdomen: Estimated fetal weight is 02/25/19: 6 lb 14 oz +/- 7 oz (74%).   Fetal presentation: vertex  Introitus: Normal vulva. Pelvis: adequate for delivery.   Cervix: Cervix evaluated by digital exam.   3/50/-3  Fetal Exam Fetal Monitor Review: Mode: ultrasound.   Baseline rate: 143.      Physical Exam  Constitutional: She is oriented to person, place, and time. She appears well-developed and well-nourished. No distress.  HENT:  Head: Normocephalic and atraumatic.  Eyes: EOM are normal.  Cardiovascular: Normal rate and regular rhythm.  Respiratory: Effort normal and breath sounds normal. No respiratory distress.  GI: There is no abdominal tenderness.  Genitourinary:    Vulva normal.   Musculoskeletal:        General: Normal range of motion.     Cervical back: Normal range of motion.  Neurological: She is alert  and oriented to person, place, and time.  Skin: Skin is warm and dry.  Psychiatric: She has a normal mood and affect.    Prenatal labs: ABO, Rh: O/Negative/-- (07/09 0000) Antibody: Negative (07/09 0000) Rubella: Immune (07/09 0000) RPR: Nonreactive (07/09 0000)  HBsAg: Negative (07/09 0000)  HIV: Non-reactive (07/09 0000)  GBS:   Postive  Assessment/Plan: IUP @ 36 6/7 weeks for IOL CHTN with superimposed Preeclampsia now with nonreassuring fetal testing  Very favorable cervix.  Start Pitocin on admission and AROM once  progressing.  Magnesium Sulfate for seizure prophylaxis if pt has severe range BPs.  Plan for 24 hours of Magnesium Sulfate postpartum GBS+  PCN  Morbid obesity  SCDs. Fentanyl and/or Epidural for pain management. Rh negative.  Rhogam postpartum as indicated. Thurnell Lose 02/25/2019, 12:41 PM

## 2019-02-26 ENCOUNTER — Inpatient Hospital Stay (HOSPITAL_COMMUNITY): Payer: BC Managed Care – PPO

## 2019-02-26 ENCOUNTER — Encounter (HOSPITAL_COMMUNITY): Payer: Self-pay | Admitting: Obstetrics and Gynecology

## 2019-02-26 LAB — COMPREHENSIVE METABOLIC PANEL
ALT: 17 U/L (ref 0–44)
AST: 18 U/L (ref 15–41)
Albumin: 2.2 g/dL — ABNORMAL LOW (ref 3.5–5.0)
Alkaline Phosphatase: 117 U/L (ref 38–126)
Anion gap: 14 (ref 5–15)
BUN: <5 mg/dL — ABNORMAL LOW (ref 6–20)
CO2: 23 mmol/L (ref 22–32)
Calcium: 8.8 mg/dL — ABNORMAL LOW (ref 8.9–10.3)
Chloride: 102 mmol/L (ref 98–111)
Creatinine, Ser: 0.49 mg/dL (ref 0.44–1.00)
GFR calc Af Amer: >60 mL/min (ref >60)
GFR calc non Af Amer: >60 mL/min (ref >60)
Glucose, Bld: 78 mg/dL (ref 70–99)
Potassium: 3.7 mmol/L (ref 3.5–5.1)
Sodium: 139 mmol/L (ref 135–145)
Total Bilirubin: 0.7 mg/dL (ref 0.3–1.2)
Total Protein: 5.6 g/dL — ABNORMAL LOW (ref 6.5–8.1)

## 2019-02-26 LAB — CBC
HCT: 32.7 % — ABNORMAL LOW (ref 36.0–46.0)
Hemoglobin: 9.6 g/dL — ABNORMAL LOW (ref 12.0–15.0)
MCH: 22.2 pg — ABNORMAL LOW (ref 26.0–34.0)
MCHC: 29.4 g/dL — ABNORMAL LOW (ref 30.0–36.0)
MCV: 75.5 fL — ABNORMAL LOW (ref 80.0–100.0)
Platelets: 379 10*3/uL (ref 150–400)
RBC: 4.33 MIL/uL (ref 3.87–5.11)
RDW: 27.6 % — ABNORMAL HIGH (ref 11.5–15.5)
WBC: 7.6 10*3/uL (ref 4.0–10.5)
nRBC: 0 % (ref 0.0–0.2)

## 2019-02-26 LAB — RPR: RPR Ser Ql: NONREACTIVE

## 2019-02-26 MED ORDER — COCONUT OIL OIL
1.0000 "application " | TOPICAL_OIL | Status: DC | PRN
  Administered 2019-02-26: 1 via TOPICAL

## 2019-02-26 MED ORDER — NIFEDIPINE 10 MG PO CAPS: 20.0000 mg | ORAL_CAPSULE | ORAL | Status: DC | PRN

## 2019-02-26 MED ORDER — NIFEDIPINE 10 MG PO CAPS: 10.0000 mg | ORAL_CAPSULE | ORAL | Status: DC | PRN

## 2019-02-26 MED ORDER — OXYTOCIN 40 UNITS IN NORMAL SALINE INFUSION - SIMPLE MED: 2.5000 [IU]/h | INTRAVENOUS | Status: DC | PRN

## 2019-02-26 MED ORDER — POLYSACCHARIDE IRON COMPLEX 150 MG PO CAPS
150.0000 mg | ORAL_CAPSULE | Freq: Every day | ORAL | Status: DC
  Administered 2019-02-27 – 2019-02-28 (×2): 150 mg via ORAL
  Filled 2019-02-26 (×2): qty 1

## 2019-02-26 MED ORDER — MAGNESIUM SULFATE BOLUS VIA INFUSION: 4.0000 g | Freq: Once | INTRAVENOUS | Status: DC

## 2019-02-26 MED ORDER — WITCH HAZEL-GLYCERIN EX PADS: 1.0000 "application " | MEDICATED_PAD | CUTANEOUS | Status: DC | PRN

## 2019-02-26 MED ORDER — SENNOSIDES-DOCUSATE SODIUM 8.6-50 MG PO TABS
2.0000 | ORAL_TABLET | ORAL | Status: DC
  Administered 2019-02-27 (×2): 2 via ORAL
  Filled 2019-02-26 (×2): qty 2

## 2019-02-26 MED ORDER — MAGNESIUM HYDROXIDE 400 MG/5ML PO SUSP: 30.0000 mL | ORAL | Status: DC | PRN

## 2019-02-26 MED ORDER — ONDANSETRON HCL 4 MG/2ML IJ SOLN: 4.0000 mg | INTRAMUSCULAR | Status: DC | PRN

## 2019-02-26 MED ORDER — DIPHENHYDRAMINE HCL 25 MG PO CAPS
25.0000 mg | ORAL_CAPSULE | Freq: Four times a day (QID) | ORAL | Status: DC | PRN
  Administered 2019-02-26: 25 mg via ORAL
  Filled 2019-02-26: qty 1

## 2019-02-26 MED ORDER — SIMETHICONE 80 MG PO CHEW: 80.0000 mg | CHEWABLE_TABLET | ORAL | Status: DC | PRN

## 2019-02-26 MED ORDER — OXYCODONE HCL 5 MG PO TABS: 10.0000 mg | ORAL_TABLET | ORAL | Status: DC | PRN

## 2019-02-26 MED ORDER — MISOPROSTOL 200 MCG PO TABS: 800.0000 ug | ORAL_TABLET | Freq: Once | ORAL | Status: DC | PRN

## 2019-02-26 MED ORDER — TETANUS-DIPHTH-ACELL PERTUSSIS 5-2.5-18.5 LF-MCG/0.5 IM SUSP
0.5000 mL | Freq: Once | INTRAMUSCULAR | Status: AC
  Administered 2019-02-28: 0.5 mL via INTRAMUSCULAR
  Filled 2019-02-26: qty 0.5

## 2019-02-26 MED ORDER — MAGNESIUM SULFATE 40 GM/1000ML IV SOLN: 1.0000 g/h | INTRAVENOUS | Status: DC

## 2019-02-26 MED ORDER — PANTOPRAZOLE SODIUM 40 MG PO TBEC
40.0000 mg | DELAYED_RELEASE_TABLET | Freq: Every day | ORAL | Status: DC
  Administered 2019-02-26 – 2019-02-27 (×2): 40 mg via ORAL
  Filled 2019-02-26 (×2): qty 1

## 2019-02-26 MED ORDER — OXYCODONE HCL 5 MG PO TABS: 5.0000 mg | ORAL_TABLET | ORAL | Status: DC | PRN

## 2019-02-26 MED ORDER — LABETALOL HCL 5 MG/ML IV SOLN: 40.0000 mg | INTRAVENOUS | Status: DC | PRN

## 2019-02-26 MED ORDER — ACETAMINOPHEN 325 MG PO TABS
650.0000 mg | ORAL_TABLET | ORAL | Status: DC | PRN
  Administered 2019-02-26 – 2019-02-27 (×3): 650 mg via ORAL
  Filled 2019-02-26 (×3): qty 2

## 2019-02-26 MED ORDER — ONDANSETRON HCL 4 MG PO TABS: 4.0000 mg | ORAL_TABLET | ORAL | Status: DC | PRN

## 2019-02-26 MED ORDER — BENZOCAINE-MENTHOL 20-0.5 % EX AERO
1.0000 "application " | INHALATION_SPRAY | CUTANEOUS | Status: DC | PRN
  Administered 2019-02-26: 1 via TOPICAL
  Filled 2019-02-26: qty 56

## 2019-02-26 MED ORDER — PRENATAL MULTIVITAMIN CH
1.0000 | ORAL_TABLET | Freq: Every day | ORAL | Status: DC
  Administered 2019-02-26 – 2019-02-28 (×3): 1 via ORAL
  Filled 2019-02-26 (×3): qty 1

## 2019-02-26 MED ORDER — ZOLPIDEM TARTRATE 5 MG PO TABS: 5.0000 mg | ORAL_TABLET | Freq: Every evening | ORAL | Status: DC | PRN

## 2019-02-26 MED ORDER — IBUPROFEN 600 MG PO TABS
600.0000 mg | ORAL_TABLET | Freq: Four times a day (QID) | ORAL | Status: DC
  Administered 2019-02-26 – 2019-02-28 (×9): 600 mg via ORAL
  Filled 2019-02-26 (×9): qty 1

## 2019-02-26 MED ORDER — DIBUCAINE (PERIANAL) 1 % EX OINT: 1.0000 "application " | TOPICAL_OINTMENT | CUTANEOUS | Status: DC | PRN

## 2019-02-26 NOTE — Anesthesia Postprocedure Evaluation (Signed)
Anesthesia Post Note  Patient: Evelyn Marshall  Procedure(s) Performed: AN AD HOC LABOR EPIDURAL     Patient location during evaluation: OB High Risk Anesthesia Type: Epidural Level of consciousness: awake and alert Pain management: pain level controlled Vital Signs Assessment: post-procedure vital signs reviewed and stable Respiratory status: spontaneous breathing, nonlabored ventilation and respiratory function stable Cardiovascular status: stable Postop Assessment: no headache, no backache, epidural receding, no apparent nausea or vomiting, patient able to bend at knees, adequate PO intake and able to ambulate Anesthetic complications: no    Last Vitals:  Vitals:   02/26/19 1030 02/26/19 1250  BP: (!) 148/83 (!) 143/77  Pulse: 96   Resp: 17   Temp:    SpO2: 100%     Last Pain:  Vitals:   02/26/19 1424  TempSrc:   PainSc: 0-No pain   Pain Goal: Patients Stated Pain Goal: 3 (02/26/19 1000)                 Shiasia Porro Hristova

## 2019-02-26 NOTE — Progress Notes (Addendum)
Evelyn Marshall is a 34 y.o. [phone] at [redacted]w[redacted]d   Subjective: Pt is comfortable with epidural.   Objective: BP 128/66   Pulse 82   Temp 98.3 F (36.8 C) (Oral)   Resp 16   Ht 5\' 4"  (1.626 m)   Wt 135.4 kg   LMP 06/18/2018   SpO2 97%   BMI 51.25 kg/m   FHT:  FHR: 130s bpm, variability: moderate,  accelerations:  Present,  decelerations:  Absent UC:   regular, every 3-4 minutes SVE:   Dilation: 4 Effacement (%): 70 Station: -3 Exam by:: Dr. Satira Anis  Labs: Lab Results  Component Value Date   WBC 8.0 02/25/2019   HGB 9.5 (L) 02/25/2019   HCT 31.8 (L) 02/25/2019   MCV 75.2 (L) 02/25/2019   PLT 424 (H) 02/25/2019    Assessment / Plan: 34 year old G4P2012 at 37.0 weeks IOL due to [facility] with superimposed Preeclampsia with nonreassuring fetal testing.    BPP 4/8 in the office.  NST reactive upon arrival, now 6/10.  Labor: Pitocin infusing, continue per protocol. AROM when presenting part well applied. Pt comfortable with epidural  Preeclampsia:  BP mildly elevated in pt's normal range.  Labs negative for preeclampsia with severe featurs   Start Magnesium sulfate if Severe range BPs.  IV Labetalol prn. Fetal Wellbeing:  Category I Pain Control:  epidural I/D:  GBS pos, treated x 3 Anticipated MOD:  NSVD   Jennifer B Crumpler 02/26/2019, 1:58 AM

## 2019-02-26 NOTE — Lactation Note (Addendum)
This note was copied from a baby's chart. Lactation Consultation Note  Patient Name: Evelyn Marshall [id number] Date: 02/26/2019 Reason for consult: Initial assessment;Early term 37-38.6wks P3.  Mom is still breastfeeding 32 year old.  Baby is 49 weeks and 5 hours old.  Baby has been to breast 3 times.  First blood sugar was 35.  Mom has been doing skin to skin but recently dressed baby and swaddled.  Instructed on hand expression.  2 mls hand expressed and spoon fed to baby.  Assisted with latching baby to left breast using cradle hold.  Mom has large nipples which makes a deep latch challenging.  Baby latched with shallow latch and fed actively.  Explained to mom that because baby was early he may be extra sleepy.  Recommended pumping and hand expression if unable to wake baby for feeds.  Breastfeeding consultation services information given to patient. Recent blood sugar was 55.  Maternal Data Has patient been taught Hand Expression?: Yes Does the patient have breastfeeding experience prior to this delivery?: Yes  Feeding Feeding Type: Breast Fed  LATCH Score Latch: Grasps breast easily, tongue down, lips flanged, rhythmical sucking.  Audible Swallowing: A few with stimulation  Type of Nipple: Everted at rest and after stimulation  Comfort (Breast/Nipple): Soft / non-tender  Hold (Positioning): Assistance needed to correctly position infant at breast and maintain latch.  LATCH Score: 8  Interventions Interventions: Assisted with latch;Adjust position;Hand express  Lactation Tools Discussed/Used     Consult Status Consult Status: Follow-up Date: 02/27/19 Follow-up type: In-patient    Ave Filter 02/26/2019, 1:22 PM

## 2019-02-26 NOTE — Progress Notes (Signed)
Evelyn Marshall is a 34 y.o. [phone] at [redacted]w[redacted]d   Subjective: Pt is comfortable with epidural.   Objective: BP 119/75   Pulse 85   Temp 97.8 F (36.6 C) (Oral)   Resp 16   Ht 5\' 4"  (1.626 m)   Wt 135.4 kg   LMP 06/18/2018   SpO2 98%   BMI 51.25 kg/m   FHT:  FHR: 130s bpm, variability: moderate,  accelerations:  Present,  decelerations:  Absent UC:   regular, every 3-4 minutes SVE:   Dilation: 5 Effacement (%): 70 Station: -2 Exam by:: Dr. Jeralene Peters AROM [phone]  Labs: Lab Results  Component Value Date   WBC 8.0 02/25/2019   HGB 9.5 (L) 02/25/2019   HCT 31.8 (L) 02/25/2019   MCV 75.2 (L) 02/25/2019   PLT 424 (H) 02/25/2019    Assessment / Plan: 34 year old G4P2012 at 37.0 weeks IOL due to [facility] with superimposed Preeclampsia with nonreassuring fetal testing.    BPP 4/8 in the office.  NST reactive upon arrival, now 6/10.  Labor: Pitocin infusing, continue per protocol. AROM complete Preeclampsia:  BP mildly elevated in pt's normal range.  Labs negative for preeclampsia with severe featurs   Start Magnesium sulfate if Severe range BPs.  IV Labetalol prn. Fetal Wellbeing:  Category I Pain Control:  epidural I/D:  GBS pos, treated x 3 Anticipated MOD:  NSVD   Raeonna Milo B Cincere Deprey 02/26/2019, 6:31 AM

## 2019-02-27 LAB — CBC
HCT: 30.6 % — ABNORMAL LOW (ref 36.0–46.0)
Hemoglobin: 9.2 g/dL — ABNORMAL LOW (ref 12.0–15.0)
MCH: 22.2 pg — ABNORMAL LOW (ref 26.0–34.0)
MCHC: 30.1 g/dL (ref 30.0–36.0)
MCV: 73.9 fL — ABNORMAL LOW (ref 80.0–100.0)
Platelets: 398 10*3/uL (ref 150–400)
RBC: 4.14 MIL/uL (ref 3.87–5.11)
RDW: 27.5 % — ABNORMAL HIGH (ref 11.5–15.5)
WBC: 7.5 10*3/uL (ref 4.0–10.5)
nRBC: 0 % (ref 0.0–0.2)

## 2019-02-27 LAB — COMPREHENSIVE METABOLIC PANEL
ALT: 17 U/L (ref 0–44)
AST: 18 U/L (ref 15–41)
Albumin: 2.2 g/dL — ABNORMAL LOW (ref 3.5–5.0)
Alkaline Phosphatase: 125 U/L (ref 38–126)
Anion gap: 9 (ref 5–15)
BUN: 5 mg/dL — ABNORMAL LOW (ref 6–20)
CO2: 22 mmol/L (ref 22–32)
Calcium: 9.3 mg/dL (ref 8.9–10.3)
Chloride: 109 mmol/L (ref 98–111)
Creatinine, Ser: 0.6 mg/dL (ref 0.44–1.00)
GFR calc Af Amer: >60 mL/min (ref >60)
GFR calc non Af Amer: >60 mL/min (ref >60)
Glucose, Bld: 101 mg/dL — ABNORMAL HIGH (ref 70–99)
Potassium: 3.8 mmol/L (ref 3.5–5.1)
Sodium: 140 mmol/L (ref 135–145)
Total Bilirubin: 0.6 mg/dL (ref 0.3–1.2)
Total Protein: 5.6 g/dL — ABNORMAL LOW (ref 6.5–8.1)

## 2019-02-27 MED ORDER — MAGNESIUM SULFATE BOLUS VIA INFUSION
4.0000 g | Freq: Once | INTRAVENOUS | Status: AC
  Administered 2019-02-27: 4 g via INTRAVENOUS
  Filled 2019-02-27: qty 1000

## 2019-02-27 MED ORDER — RHO D IMMUNE GLOBULIN 1500 UNIT/2ML IJ SOSY
300.0000 ug | PREFILLED_SYRINGE | Freq: Once | INTRAMUSCULAR | Status: AC
  Administered 2019-02-28: 300 ug via INTRAVENOUS
  Filled 2019-02-27: qty 2

## 2019-02-27 MED ORDER — LACTATED RINGERS IV SOLN: INTRAVENOUS | Status: DC

## 2019-02-27 MED ORDER — FUROSEMIDE 10 MG/ML IJ SOLN
20.0000 mg | Freq: Once | INTRAMUSCULAR | Status: AC
  Administered 2019-02-27: 20 mg via INTRAVENOUS
  Filled 2019-02-27: qty 2

## 2019-02-27 MED ORDER — MAGNESIUM SULFATE 40 GM/1000ML IV SOLN
2.0000 g/h | INTRAVENOUS | Status: AC
  Administered 2019-02-28: 2 g/h via INTRAVENOUS
  Filled 2019-02-27 (×2): qty 1000

## 2019-02-27 MED ORDER — NIFEDIPINE ER OSMOTIC RELEASE 30 MG PO TB24
30.0000 mg | ORAL_TABLET | Freq: Every day | ORAL | Status: DC
  Administered 2019-02-28: 30 mg via ORAL
  Filled 2019-02-27: qty 1

## 2019-02-27 NOTE — Lactation Note (Signed)
This note was copied from a baby's chart. Lactation Consultation Note  Patient Name: Evelyn Marshall [id number] Date: 02/27/2019 Reason for consult: Follow-up assessment;Early term 37-38.6wks;Infant weight loss GHTN, on MgSO4  Assessment-Difficult latch, 37 weeks, weight loss  Visited with P3 Mom of ET infant (37 weeks) at 29 hrs old.  Baby down 4.8% from birthweight.    Mom has breastfed baby 10 times in last 24 hrs.  Mom about to latch baby currently.  Offered pillow support, Mom using cradle hold.  Baby opening small amount and baby latching onto Mom's nipple.  Nipples are fairly large in diameter and baby sucking with a pursed mouth.  Switched to cross cradle hold, but Mom having a hard time due to large breasts.  Recommended football hold.  Reviewed breast massage and hand expression, colostrum collected into colostrum container.  Milk looks transitional due to Mom still breastfeeding her 29 yr old at night.  Set Mom up with pillow to her back and pillows stacked to support baby.  Baby fussing and opening mouth just enough to grab Mom's nipple.  Mom initially pushing nipple into baby's mouth.  Assisted Mom to tease with her nipple to baby's top lip, when he opened wide, brought baby to breast.  Pulled down on baby's chin to opened his mouth wider, and flanged lower lip.  Baby alert and rhythmically sucking and swallowing.  Showed Mom how to use alternate breast compression to increase milk flow to baby.    Set up DEBP and assisted Mom with pumping between two latches she was helped with.  Milk expressed already after a few minutes.    27 mm flanges tried, but switched to 24 mm for better movement of nipple.  Mom instructed to switch to 27 mm if nipple becomes swollen and starts rubbing inside flange.  Plan written on dry erase board-  1- Keep baby STS as much as possible 2- Offer breast with cues 3- Breastfeed with a deep latch (ask for help prn) 4- Pump both breasts 15 mins on initiation  setting, adding breast massage and hand expression to collect as much EBM as possible 5- Feed baby by spoon, curved tip syringe or slow flow paced bottle any EBM collected.   6- If baby becomes sleepy and won't latch with deep latch, hearing swallowing for at least 10 mins, baby will need supplement using EBM or donor breast milk per volume guidelines.    RN aware of plan.  Baby will be circumcised this afternoon.  Baby to remain STS and Mom can double pump in place of a feeding if baby is sleepy and feed baby EBM, using donor milk per guidelines.   Mom knows to call for assistance prn.  Mom has a Lansinoh DEBP at home, and a Medela PIS from her 32 yr old also.    LATCH Score Latch: Grasps breast easily, tongue down, lips flanged, rhythmical sucking.  Audible Swallowing: Spontaneous and intermittent(needing some stimulation periodically)  Type of Nipple: Everted at rest and after stimulation  Comfort (Breast/Nipple): Soft / non-tender  Hold (Positioning): Assistance needed to correctly position infant at breast and maintain latch.  LATCH Score: 9  Interventions Interventions: Breast feeding basics reviewed;Assisted with latch;Skin to skin;Breast massage;Hand express;Breast compression;Adjust position;Support pillows;Position options;Expressed milk;DEBP  Lactation Tools Discussed/Used WIC Program: No Pump Review: Setup, frequency, and cleaning;Milk Storage Initiated by:: Cipriano Mile RN IBCLC Date initiated:: 02/27/19   Consult Status Consult Status: Follow-up Date: 02/28/19 Follow-up type: In-patient    Tilda Burrow  E 02/27/2019, 12:35 PM

## 2019-02-27 NOTE — Progress Notes (Signed)
Postpartum day #1, NSVD  Subjective I spoke with pt and RN this am.  Pt did not diuresis overnight.  BP is 140-150s/80s-90s. Pt denied symptoms.  Pt gave consent for circumcision. Pt without complaints.  Lochia normal.  Pain controlled.  Breast feeding Yes.  Baby is latching well. Denies headaches, abdominal pain, no SOB or visual changes.  Pt is s/p Lasix and states her urination increases tremendously.  Temp:  [97.8 F (36.6 C)-98.7 F (37.1 C)] 98.5 F (36.9 C) (02/04 1438) Pulse Rate:  [73-85] 81 (02/04 1438) Resp:  [17-18] 18 (02/04 1438) BP: (131-158)/(61-99) 142/88 (02/04 1438) SpO2:  [94 %-99 %] 99 % (02/04 1438)   I/O last 3 completed shifts: In: 2805.7 [P.O.:240; I.V.:2565.7] Out: 2925 [Urine:2825; Blood:100] Total I/O In: 1295.5 [P.O.:720; I.V.:575.5] Out: 3400 [Urine:3400]   Gen:  NAD, A&O x 3 Uterine fundus:  Firm, nontender Lochia normal Ext:  +Edema, no calf tenderness bilaterally  CBC Latest Ref Rng & Units 02/27/2019 02/26/2019 02/25/2019  WBC 4.0 - 10.5 K/uL 7.5 7.6 8.0  Hemoglobin 12.0 - 15.0 g/dL 9.2(L) 9.6(L) 9.5(L)  Hematocrit 36.0 - 46.0 % 30.6(L) 32.7(L) 31.8(L)  Platelets 150 - 400 K/uL 398 379 424(H)    CMP Latest Ref Rng & Units 02/27/2019 02/26/2019 02/25/2019  Glucose 70 - 99 mg/dL 101(H) 78 90  BUN 6 - 20 mg/dL 5(L) <5(L) <5(L)  Creatinine 0.44 - 1.00 mg/dL 0.60 0.49 0.52  Sodium 135 - 145 mmol/L 140 139 136  Potassium 3.5 - 5.1 mmol/L 3.8 3.7 3.5  Chloride 98 - 111 mmol/L 109 102 105  CO2 22 - 32 mmol/L 22 23 19(L)  Calcium 8.9 - 10.3 mg/dL 9.3 8.8(L) 8.8(L)  Total Protein 6.5 - 8.1 g/dL 5.6(L) 5.6(L) 6.3(L)  Total Bilirubin 0.3 - 1.2 mg/dL 0.6 0.7 0.3  Alkaline Phos 38 - 126 U/L 125 117 142(H)  AST 15 - 41 U/L 18 18 23   ALT 0 - 44 U/L 17 17 18     A/P: S/p SVD. CHTN with superimposed Preeclampsia.  Started Magnesium this am due to lack of diuresis.  ~3 L diuresis s/p Lasix 20 mg IV x 1.  Continue until am and Start Procardia XL 30  mg. Continue other Routine postpartum care. Lactation support. Circumcision done. Discharge late tomorrow if BP controlled or am of PPD #3. Dr. Landry Mellow covering pt aware.  Thurnell Lose 02/27/2019, 3:13 PM

## 2019-02-28 LAB — CBC
HCT: 32.7 % — ABNORMAL LOW (ref 36.0–46.0)
Hemoglobin: 9.9 g/dL — ABNORMAL LOW (ref 12.0–15.0)
MCH: 22.3 pg — ABNORMAL LOW (ref 26.0–34.0)
MCHC: 30.3 g/dL (ref 30.0–36.0)
MCV: 73.8 fL — ABNORMAL LOW (ref 80.0–100.0)
Platelets: 406 10*3/uL — ABNORMAL HIGH (ref 150–400)
RBC: 4.43 MIL/uL (ref 3.87–5.11)
RDW: 27.5 % — ABNORMAL HIGH (ref 11.5–15.5)
WBC: 6.6 10*3/uL (ref 4.0–10.5)
nRBC: 0 % (ref 0.0–0.2)

## 2019-02-28 LAB — COMPREHENSIVE METABOLIC PANEL
ALT: 18 U/L (ref 0–44)
AST: 20 U/L (ref 15–41)
Albumin: 2.5 g/dL — ABNORMAL LOW (ref 3.5–5.0)
Alkaline Phosphatase: 119 U/L (ref 38–126)
Anion gap: 9 (ref 5–15)
BUN: 5 mg/dL — ABNORMAL LOW (ref 6–20)
CO2: 23 mmol/L (ref 22–32)
Calcium: 8.3 mg/dL — ABNORMAL LOW (ref 8.9–10.3)
Chloride: 106 mmol/L (ref 98–111)
Creatinine, Ser: 0.58 mg/dL (ref 0.44–1.00)
GFR calc Af Amer: >60 mL/min (ref >60)
GFR calc non Af Amer: >60 mL/min (ref >60)
Glucose, Bld: 87 mg/dL (ref 70–99)
Potassium: 3.9 mmol/L (ref 3.5–5.1)
Sodium: 138 mmol/L (ref 135–145)
Total Bilirubin: 0.2 mg/dL — ABNORMAL LOW (ref 0.3–1.2)
Total Protein: 5.8 g/dL — ABNORMAL LOW (ref 6.5–8.1)

## 2019-02-28 MED ORDER — IBUPROFEN 600 MG PO TABS: 600.0000 mg | ORAL_TABLET | Freq: Four times a day (QID) | ORAL | 0 refills | Status: DC | PRN

## 2019-02-28 MED ORDER — NIFEDIPINE ER 30 MG PO TB24: 30.0000 mg | ORAL_TABLET | Freq: Every day | ORAL | 2 refills | Status: DC

## 2019-02-28 NOTE — Lactation Note (Signed)
This note was copied from a baby's chart. Lactation Consultation Note  Patient Name: Evelyn Marshall [id number] Date: 02/28/2019 Reason for consult: Follow-up assessment;Early term 60-38.6wks Baby is 48 hours old/1% weight loss.  Baby gained since yesterday.  Mom states baby is feeding well and has no concerns.  Breasts are filling.  Instructed to feed with cues and call for assist prn.  Maternal Data    Feeding Feeding Type: Breast Fed  LATCH Score                   Interventions    Lactation Tools Discussed/Used     Consult Status Consult Status: PRN Follow-up type: Call as needed    Ave Filter 02/28/2019, 8:05 AM

## 2019-02-28 NOTE — Discharge Instructions (Signed)
Preeclampsia and Eclampsia Preeclampsia is a serious condition that may develop during pregnancy. This condition causes high blood pressure and increased protein in your urine along with other symptoms, such as headaches and vision changes. These symptoms may develop as the condition gets worse. Preeclampsia may occur at 20 weeks of pregnancy or later. Diagnosing and treating preeclampsia early is very important. If not treated early, it can cause serious problems for you and your baby. One problem it can lead to is eclampsia. Eclampsia is a condition that causes muscle jerking or shaking (convulsions or seizures) and other serious problems for the mother. During pregnancy, delivering your baby may be the best treatment for preeclampsia or eclampsia. For most women, preeclampsia and eclampsia symptoms go away after giving birth. In rare cases, a woman may develop preeclampsia after giving birth (postpartum preeclampsia). This usually occurs within 48 hours after childbirth but may occur up to 6 weeks after giving birth. What are the causes? The cause of preeclampsia is not known. What increases the risk? The following risk factors make you more likely to develop preeclampsia:  Being pregnant for the first time.  Having had preeclampsia during a past pregnancy.  Having a family history of preeclampsia.  Having high blood pressure.  Being pregnant with more than one baby.  Being 35 or older.  Being African-American.  Having kidney disease or diabetes.  Having medical conditions such as lupus or blood diseases.  Being very overweight (obese). What are the signs or symptoms? The most common symptoms are:  Severe headaches.  Vision problems, such as blurred or double vision.  Abdominal pain, especially upper abdominal pain. Other symptoms that may develop as the condition gets worse include:  Sudden weight gain.  Sudden swelling of the hands, face, legs, and feet.  Severe nausea  and vomiting.  Numbness in the face, arms, legs, and feet.  Dizziness.  Urinating less than usual.  Slurred speech.  Convulsions or seizures. How is this diagnosed? There are no screening tests for preeclampsia. Your health care provider will ask you about symptoms and check for signs of preeclampsia during your prenatal visits. You may also have tests that include:  Checking your blood pressure.  Urine tests to check for protein. Your health care provider will check for this at every prenatal visit.  Blood tests.  Monitoring your baby's heart rate.  Ultrasound. How is this treated? You and your health care provider will determine the treatment approach that is best for you. Treatment may include:  Having more frequent prenatal exams to check for signs of preeclampsia, if you have an increased risk for preeclampsia.  Medicine to lower your blood pressure.  Staying in the hospital, if your condition is severe. There, treatment will focus on controlling your blood pressure and the amount of fluids in your body (fluid retention).  Taking medicine (magnesium sulfate) to prevent seizures. This may be given as an injection or through an IV.  Taking a low-dose aspirin during your pregnancy.  Delivering your baby early. You may have your labor started with medicine (induced), or you may have a cesarean delivery. Follow these instructions at home: Eating and drinking   Drink enough fluid to keep your urine pale yellow.  Avoid caffeine. Lifestyle  Do not use any products that contain nicotine or tobacco, such as cigarettes and e-cigarettes. If you need help quitting, ask your health care provider.  Do not use alcohol or drugs.  Avoid stress as much as possible. Rest and get   plenty of sleep. General instructions  Take over-the-counter and prescription medicines only as told by your health care provider.  When lying down, lie on your left side. This keeps pressure off your  major blood vessels.  When sitting or lying down, raise (elevate) your feet. Try putting some pillows underneath your lower legs.  Exercise regularly. Ask your health care provider what kinds of exercise are best for you.  Keep all follow-up and prenatal visits as told by your health care provider. This is important. How is this prevented? There is no known way of preventing preeclampsia or eclampsia from developing. However, to lower your risk of complications and detect problems early:  Get regular prenatal care. Your health care provider may be able to diagnose and treat the condition early.  Maintain a healthy weight. Ask your health care provider for help managing weight gain during pregnancy.  Work with your health care provider to manage any long-term (chronic) health conditions you have, such as diabetes or kidney problems.  You may have tests of your blood pressure and kidney function after giving birth.  Your health care provider may have you take low-dose aspirin during your next pregnancy. Contact a health care provider if:  You have symptoms that your health care provider told you may require more treatment or monitoring, such as: ? Headaches. ? Nausea or vomiting. ? Abdominal pain. ? Dizziness. ? Light-headedness. Get help right away if:  You have severe: ? Abdominal pain. ? Headaches that do not get better. ? Dizziness. ? Vision problems. ? Confusion. ? Nausea or vomiting.  You have any of the following: ? A seizure. ? Sudden, rapid weight gain. ? Sudden swelling in your hands, ankles, or face. ? Trouble moving any part of your body. ? Numbness in any part of your body. ? Trouble speaking. ? Abnormal bleeding.  You faint. Summary  Preeclampsia is a serious condition that may develop during pregnancy.  This condition causes high blood pressure and increased protein in your urine along with other symptoms, such as headaches and vision  changes.  Diagnosing and treating preeclampsia early is very important. If not treated early, it can cause serious problems for you and your baby.  Get help right away if you have symptoms that your health care provider told you to watch for. This information is not intended to replace advice given to you by your health care provider. Make sure you discuss any questions you have with your health care provider. Document Revised: 09/11/2017 Document Reviewed: 08/16/2015 Elsevier Patient Education  2020 [employer]  

## 2019-02-28 NOTE — Discharge Summary (Signed)
OB Discharge Summary     Patient Name: Evelyn Marshall DOB: 25-Aug-1985 MRN: [id number]  Date of admission: 02/25/2019 Delivering MD: Thurnell Lose   Date of discharge: 02/28/2019  Admitting diagnosis: Severe preeclampsia, third trimester [O14.13] Intrauterine pregnancy: [redacted]w[redacted]d     Secondary diagnosis:  Active Problems:   Severe preeclampsia, third trimester  Additional problems: Chronic hypertension      Discharge diagnosis: Term Pregnancy Delivered and Preeclampsia (severe)                                                                                                Post partum procedures:None  Augmentation: AROM and Pitocin  Complications: None  Hospital course:  Induction of Labor With Vaginal Delivery   34 y.o. yo [phone] at [redacted]w[redacted]d was admitted to the hospital 02/25/2019 for induction of labor.  Indication for induction: Preeclampsia.  Patient had an uncomplicated labor course as follows: Membrane Rupture Time/Date: 6:13 AM ,02/26/2019   Intrapartum Procedures: Episiotomy: None [1]                                         Lacerations:  None [1]  Patient had delivery of a Viable infant.  Information for the patient's newborn:  Arly, Blee [id number]  Delivery Method: Vaginal, Spontaneous(Filed from Delivery Summary)    02/26/2019  Details of delivery can be found in separate delivery note.  Patient had a routine postpartum course. Patient is discharged home 02/28/19.  Physical exam  Vitals:   02/28/19 0834 02/28/19 1051 02/28/19 1403 02/28/19 1709  BP: (!) 151/91 (!) 143/82 (!) 147/94 (!) 146/89  Pulse: 77 90 73 69  Resp: 19 18 17 18   Temp: 98.7 F (37.1 C) 98.3 F (36.8 C)  98.8 F (37.1 C)  TempSrc: Oral Oral  Oral  SpO2: 100% 100% 100% 98%  Weight:      Height:       General: alert, cooperative and no distress Lochia: appropriate Uterine Fundus: firm Incision: N/A DVT Evaluation: No evidence of DVT seen on physical exam. Labs: Lab Results  Component Value  Date   WBC 6.6 02/28/2019   HGB 9.9 (L) 02/28/2019   HCT 32.7 (L) 02/28/2019   MCV 73.8 (L) 02/28/2019   PLT 406 (H) 02/28/2019   CMP Latest Ref Rng & Units 02/28/2019  Glucose 70 - 99 mg/dL 87  BUN 6 - 20 mg/dL 5(L)  Creatinine 0.44 - 1.00 mg/dL 0.58  Sodium 135 - 145 mmol/L 138  Potassium 3.5 - 5.1 mmol/L 3.9  Chloride 98 - 111 mmol/L 106  CO2 22 - 32 mmol/L 23  Calcium 8.9 - 10.3 mg/dL 8.3(L)  Total Protein 6.5 - 8.1 g/dL 5.8(L)  Total Bilirubin 0.3 - 1.2 mg/dL 0.2(L)  Alkaline Phos 38 - 126 U/L 119  AST 15 - 41 U/L 20  ALT 0 - 44 U/L 18    Discharge instruction: per After Visit Summary and "Baby and Me Booklet".  After visit meds:  Allergies as of 02/28/2019  No Known Allergies     Medication List    STOP taking these medications   Doxylamine-Pyridoxine 10-10 MG Tbec     TAKE these medications   ibuprofen 600 MG tablet Commonly known as: ADVIL Take 1 tablet (600 mg total) by mouth every 6 (six) hours as needed.   Integra 62.5-62.5-40-3 MG Caps Take 1 capsule by mouth daily.   NIFEdipine 30 MG 24 hr tablet Commonly known as: ADALAT CC Take 1 tablet (30 mg total) by mouth daily. Start taking on: March 01, 2019   pantoprazole 40 MG tablet Commonly known as: PROTONIX Take 1 tablet (40 mg total) by mouth at bedtime.   prenatal multivitamin Tabs tablet Take 1 tablet by mouth daily at 12 noon.       Diet: low salt diet  Activity: Advance as tolerated. Pelvic rest for 6 weeks.   Outpatient follow up:bp check in office March 04 2019  Follow up Appt:No future appointments. Follow up Visit:No follow-ups on file.  Postpartum contraception: Not Discussed  Newborn Data: Live born female  Birth Weight: 6 lb 9.5 oz (2992 g) APGAR: 35, 9  Newborn Delivery   Birth date/time: 02/26/2019 07:55:00 Delivery type: Vaginal, Spontaneous      Baby Feeding: Breast Disposition:home with mother   02/28/2019 Christophe Louis, MD

## 2019-03-01 LAB — TYPE AND SCREEN
ABO/RH(D): O NEG
Antibody Screen: POSITIVE
Unit division: 0
Unit division: 0

## 2019-03-01 LAB — RH IG WORKUP (INCLUDES ABO/RH)
ABO/RH(D): O NEG
Fetal Screen: NEGATIVE
Gestational Age(Wks): 37
Unit division: 0

## 2019-03-01 LAB — BPAM RBC
Blood Product Expiration Date: 202102252359
Blood Product Expiration Date: 202102282359
Unit Type and Rh: 9500
Unit Type and Rh: 9500

## 2020-08-31 ENCOUNTER — Other Ambulatory Visit: Payer: Self-pay

## 2020-08-31 ENCOUNTER — Encounter (HOSPITAL_COMMUNITY): Payer: Self-pay | Admitting: Obstetrics and Gynecology

## 2020-08-31 ENCOUNTER — Inpatient Hospital Stay (HOSPITAL_COMMUNITY): Payer: BC Managed Care – PPO

## 2020-08-31 ENCOUNTER — Inpatient Hospital Stay (HOSPITAL_COMMUNITY)
Admission: AD | Admit: 2020-08-31 | Discharge: 2020-08-31 | Disposition: A | Discharge status: Home / Self Care | Payer: BC Managed Care – PPO | Attending: Obstetrics and Gynecology | Admitting: Obstetrics and Gynecology

## 2020-08-31 DIAGNOSIS — O09291 Supervision of pregnancy with other poor reproductive or obstetric history, first trimester: Secondary | ICD-10-CM | POA: Diagnosis not present

## 2020-08-31 DIAGNOSIS — O209 Hemorrhage in early pregnancy, unspecified: Secondary | ICD-10-CM | POA: Diagnosis not present

## 2020-08-31 DIAGNOSIS — Z3A01 Less than 8 weeks gestation of pregnancy: Secondary | ICD-10-CM | POA: Diagnosis not present

## 2020-08-31 DIAGNOSIS — Z6791 Unspecified blood type, Rh negative: Secondary | ICD-10-CM

## 2020-08-31 DIAGNOSIS — O26891 Other specified pregnancy related conditions, first trimester: Secondary | ICD-10-CM

## 2020-08-31 LAB — HIV ANTIBODY (ROUTINE TESTING W REFLEX): HIV Screen 4th Generation wRfx: NONREACTIVE

## 2020-08-31 LAB — WET PREP, GENITAL
Clue Cells Wet Prep HPF POC: NONE SEEN
Sperm: NONE SEEN
Trich, Wet Prep: NONE SEEN
Yeast Wet Prep HPF POC: NONE SEEN

## 2020-08-31 LAB — CBC
HCT: 33.3 % — ABNORMAL LOW (ref 36.0–46.0)
Hemoglobin: 10.9 g/dL — ABNORMAL LOW (ref 12.0–15.0)
MCH: 24.5 pg — ABNORMAL LOW (ref 26.0–34.0)
MCHC: 32.7 g/dL (ref 30.0–36.0)
MCV: 74.8 fL — ABNORMAL LOW (ref 80.0–100.0)
Platelets: 366 10*3/uL (ref 150–400)
RBC: 4.45 MIL/uL (ref 3.87–5.11)
RDW: 16.4 % — ABNORMAL HIGH (ref 11.5–15.5)
WBC: 7.3 10*3/uL (ref 4.0–10.5)
nRBC: 0 % (ref 0.0–0.2)

## 2020-08-31 LAB — HCG, QUANTITATIVE, PREGNANCY: hCG, Beta Chain, Quant, S: 60442 m[IU]/mL — ABNORMAL HIGH (ref <5)

## 2020-08-31 LAB — GC/CHLAMYDIA PROBE AMP (~~LOC~~) NOT AT ARMC
Chlamydia: NEGATIVE
Comment: NEGATIVE
Comment: NORMAL
Neisseria Gonorrhea: NEGATIVE

## 2020-08-31 LAB — POCT PREGNANCY, URINE: Preg Test, Ur: POSITIVE — AB

## 2020-08-31 IMAGING — US US OB < 14 WEEKS - US OB TV
1 series · 15 of 28 positions shown · non-contrast
Comparison: None relevant.

CLINICAL DATA: 35-year-old female with vaginal bleeding in the 1st
trimester of pregnancy. Estimated gestational age by LMP 6 weeks and
4 days.

EXAM:
OBSTETRIC <14 WK US AND TRANSVAGINAL OB US
TECHNIQUE: Both transabdominal and transvaginal ultrasound examinations were
performed for complete evaluation of the gestation as well as the
maternal uterus, adnexal regions, and pelvic cul-de-sac.
Transvaginal technique was performed to assess early pregnancy.

[Series 1: us ob < 14 weeks - us ob tv · 61 acquisitions, 15 frames shown]
[im 1/61]
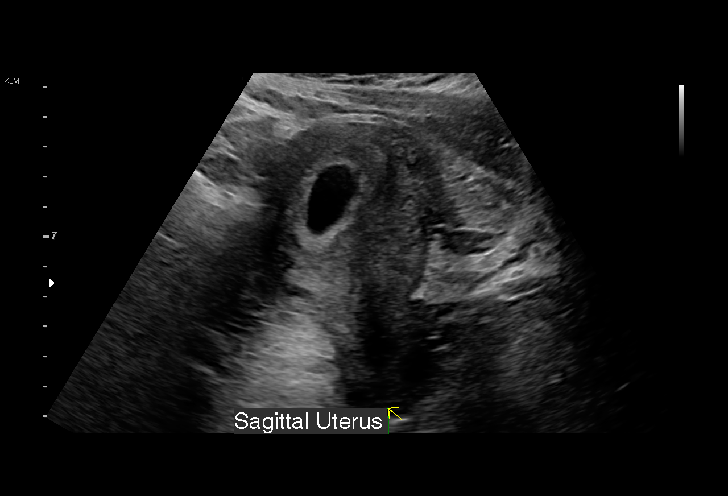
[im 5/61]
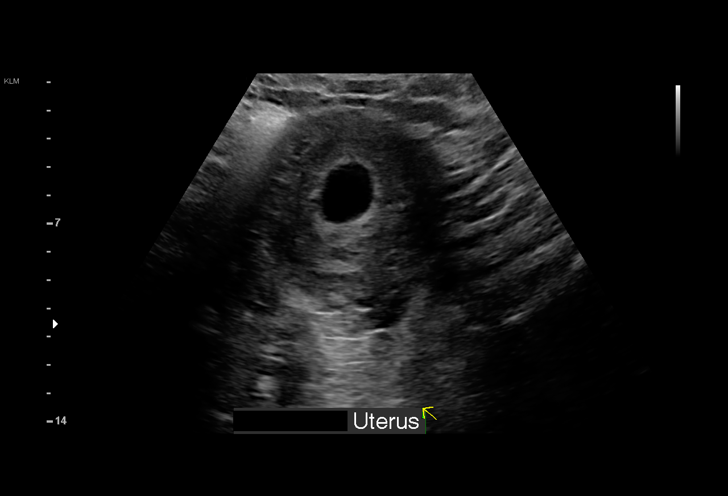
[im 9/61]
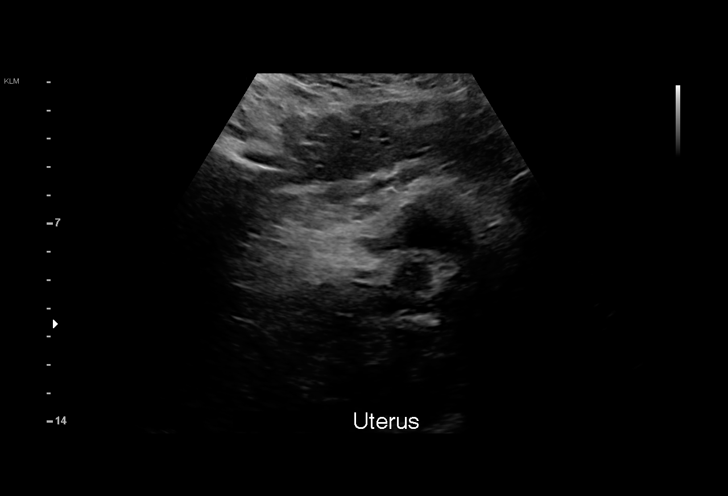
[im 14/61]
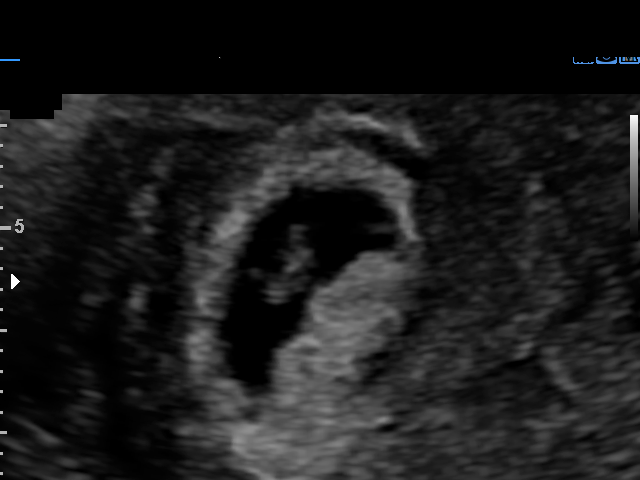
[im 18/61]
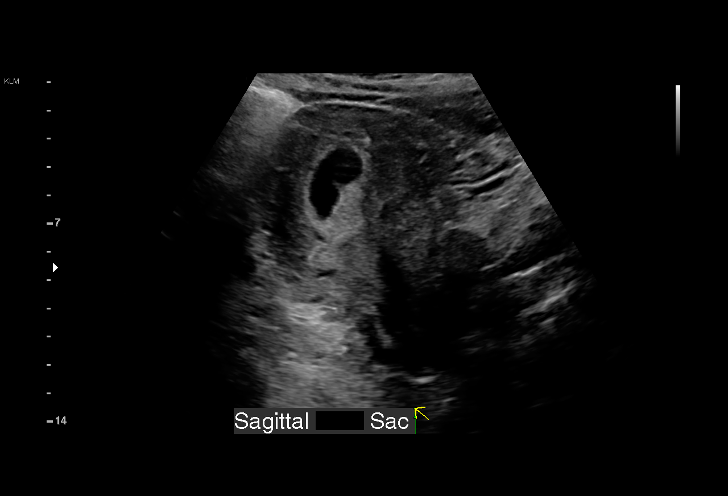
[im 23/61]
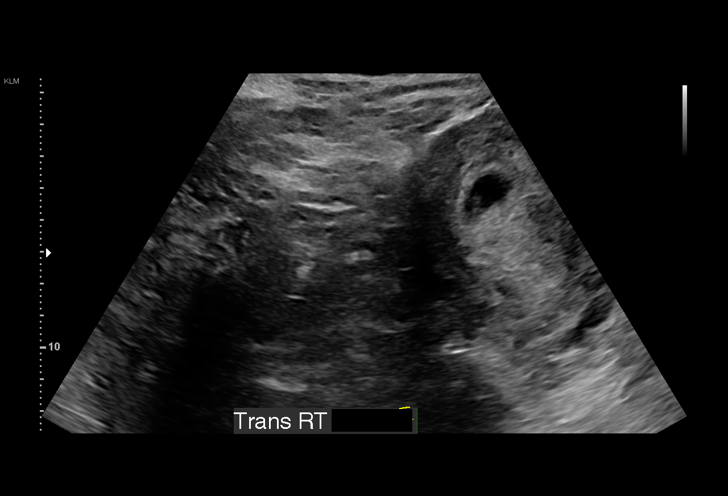
[im 27/61]
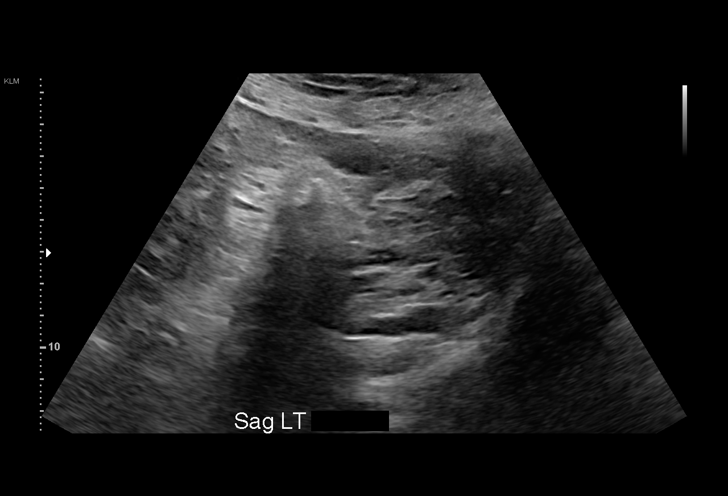
[im 32/61]
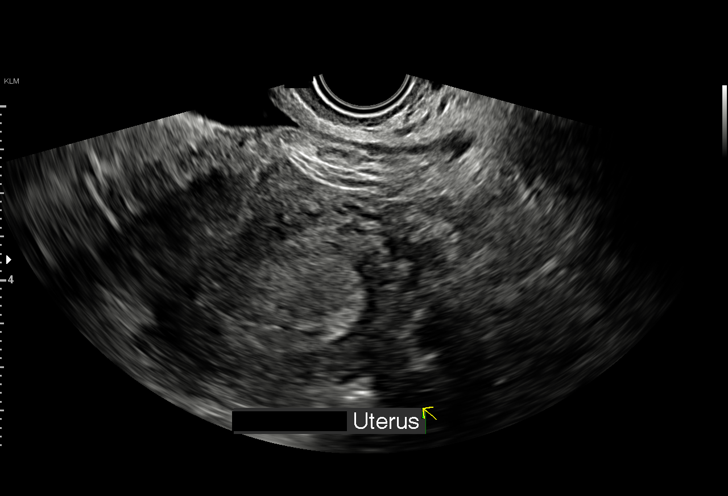
[im 34/61]
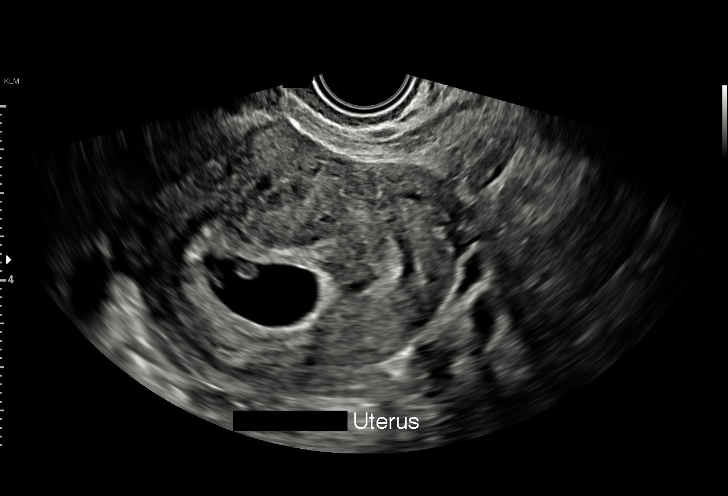
[im 38/61]
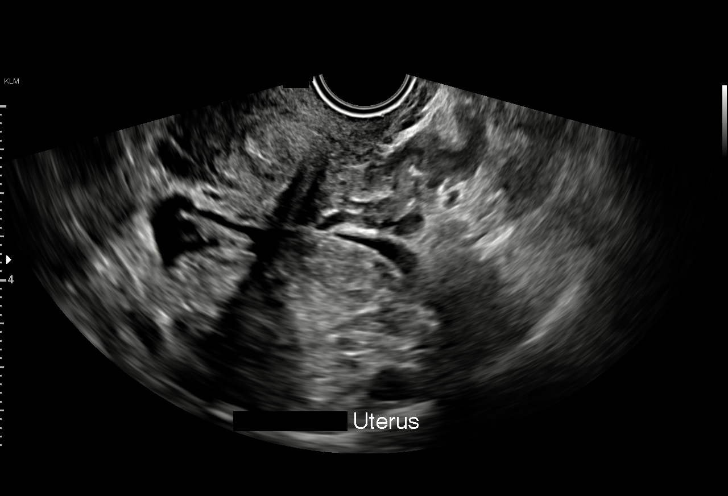
[im 43/61]
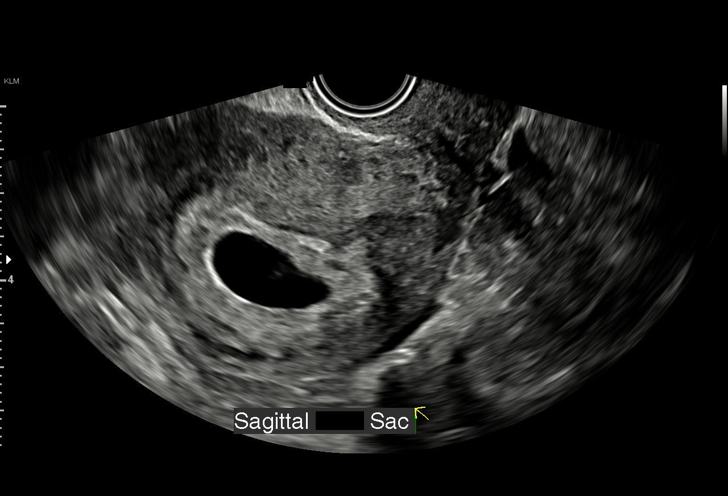
[im 47/61]
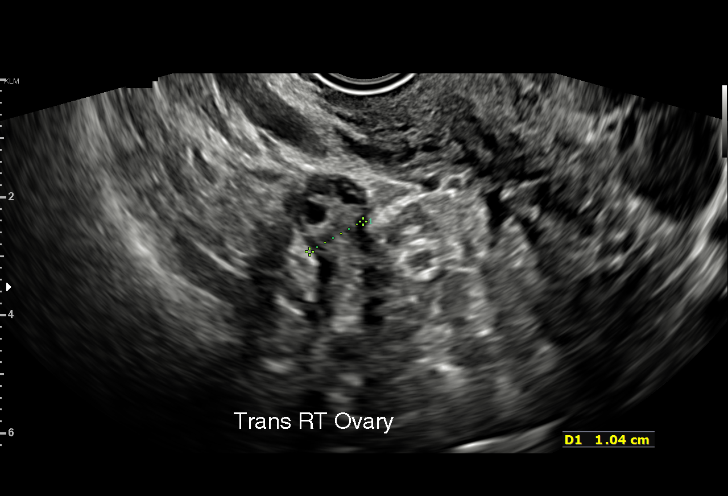
[im 52/61]
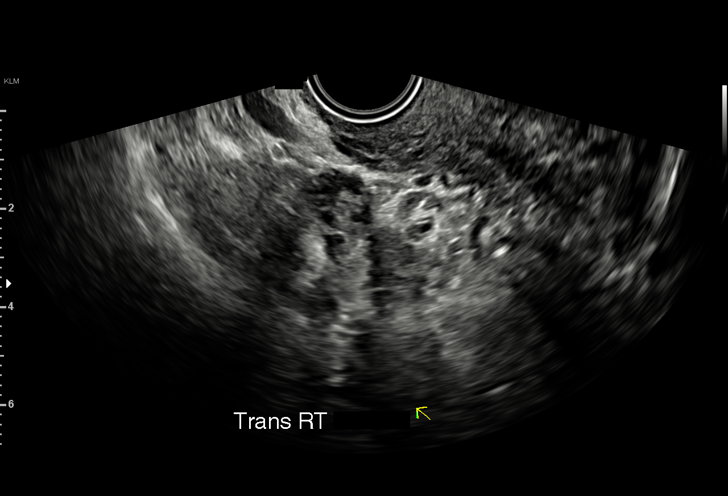
[im 56/61]
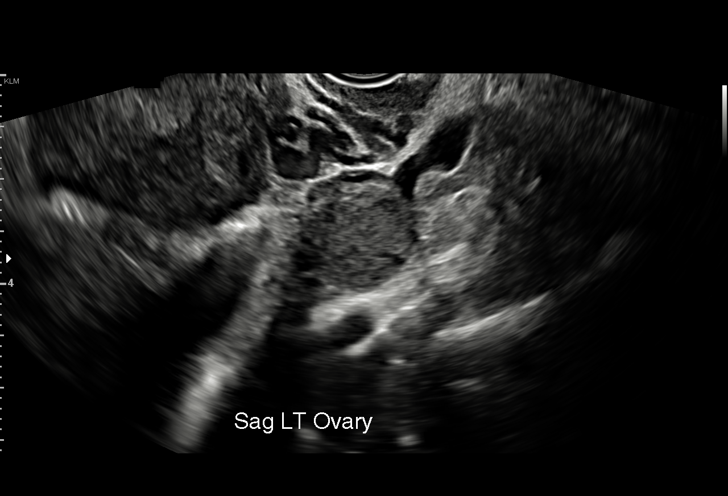
[im 61/61]
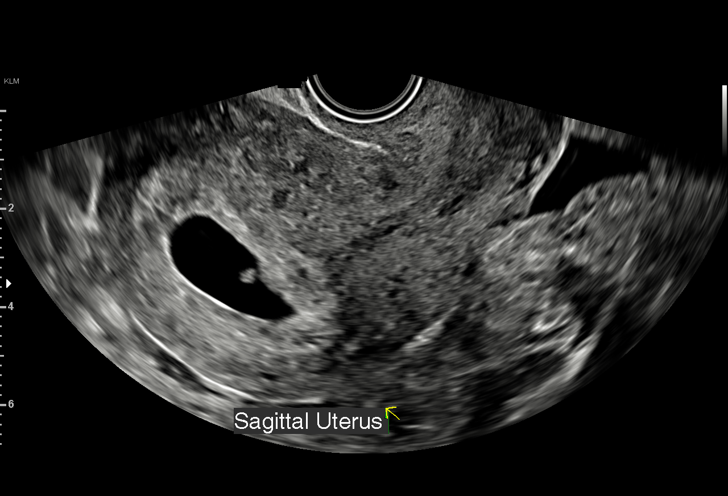

[15 of 28 positions shown; findings below may reference images not displayed]

FINDINGS: Intrauterine gestational sac: Single

Yolk sac:  Visible

Embryo:  Visible

Cardiac Activity: Detected

Heart Rate: 138 bpm

CRL: 8.5 mm   6 w   5 d                  US EDC: [DATE]

Subchorionic hemorrhage:  None visualized.

Maternal uterus/adnexae: The right ovary appears normal measuring
1.0 x 2.4 x 1.4 cm. The left ovary appears normal measuring 2.8 x
1.7 x 1.6 cm. There is trace simple appearing free fluid in the
cul-de-sac (image 61).
IMPRESSION: Single living IUP demonstrated. No acute maternal findings
visualized.

## 2020-08-31 MED ORDER — RHO D IMMUNE GLOBULIN 1500 UNIT/2ML IJ SOSY
300.0000 ug | PREFILLED_SYRINGE | Freq: Once | INTRAMUSCULAR | Status: AC
  Administered 2020-08-31: 300 ug via INTRAMUSCULAR
  Filled 2020-08-31: qty 2

## 2020-08-31 NOTE — MAU Note (Signed)
PT SAYS POSITIVE HPT ON 7-27. PT SAYS HAS VB - DARK BROWN ON TP- AT 0400.  NO PAIN LAST SEX- 1 WEEK AGO NO BC

## 2020-08-31 NOTE — MAU Provider Note (Signed)
History     CSN: [id number]  Arrival date and time: 08/31/20 0455   Event Date/Time   First Provider Initiated Contact with Patient 08/31/20 [phone]      Chief Complaint  Patient presents with   Vaginal Bleeding   [name] is a 35 y.o. year old [id number] female at 22w4dweeks gestation who presents to MAU reporting (+) HPT on 08/18/20. She reports she started having dark, brown VB on toilet paper @ 0400 this AM. She has not had any more VB since. She denies any pain. She last had SI 1 week ago. Her pregnancy history is complicated by hyperemesis gravidarum,  and 2nd trimester SAB @ 19 wks (2012)  OB History     Gravida  5   Para  3   Term  3   Preterm  0   AB  1   Living  3      SAB  0   IAB      Ectopic      Multiple  0   Live Births  3           Past Medical History:  Diagnosis Date   Abnormal Pap smear 2012   colpo result benign   Chlamydia    History of pre-eclampsia    HPV (human papilloma virus) infection    Hyperemesis    Hypokalemia    Pregnancy induced hypertension    Ptyalism    Vaginal Pap smear, abnormal     Past Surgical History:  Procedure Laterality Date   COLPOSCOPY     WISDOM TOOTH EXTRACTION      Family History  Problem Relation Age of Onset   Heart disease Father    Hypertension Father    Stroke Father    Breast cancer Paternal Grandmother    Stroke Paternal Grandmother     Social History   Tobacco Use   Smoking status: Never   Smokeless tobacco: Never  Vaping Use   Vaping Use: Never used  Substance Use Topics   Alcohol use: No   Drug use: No    Allergies: No Known Allergies  Medications Prior to Admission  Medication Sig Dispense Refill Last Dose   Fe Fum-FePoly-Vit C-Vit B3 (INTEGRA) 62.5-62.5-40-3 MG CAPS Take 1 capsule by mouth daily.      ibuprofen (ADVIL) 600 MG tablet Take 1 tablet (600 mg total) by mouth every 6 (six) hours as needed. 30 tablet 0    NIFEdipine (ADALAT CC) 30 MG 24 hr tablet Take  1 tablet (30 mg total) by mouth daily. 30 tablet 2    pantoprazole (PROTONIX) 40 MG tablet Take 1 tablet (40 mg total) by mouth at bedtime. 30 tablet 5    Prenatal Vit-Fe Fumarate-FA (PRENATAL MULTIVITAMIN) TABS tablet Take 1 tablet by mouth daily at 12 noon.       Review of Systems  Constitutional: Negative.   HENT: Negative.    Eyes: Negative.   Respiratory: Negative.    Cardiovascular: Negative.   Gastrointestinal:  Positive for nausea.  Endocrine: Negative.   Genitourinary: Negative.  Negative for pelvic pain and vaginal bleeding (brown spotting with wiping x 1).  Musculoskeletal: Negative.   Skin: Negative.   Allergic/Immunologic: Negative.   Neurological: Negative.   Hematological: Negative.   Psychiatric/Behavioral: Negative.    Physical Exam   Blood pressure 131/87, pulse 78, temperature 98.8 F (37.1 C), temperature source Oral, resp. rate 18, height '5\' 4"'$  (1.626 m), weight 121.6 kg, last  menstrual period 07/16/2020, unknown if currently breastfeeding.  Physical Exam Vitals and nursing note reviewed.  Constitutional:      Appearance: Normal appearance. She is obese.  Cardiovascular:     Rate and Rhythm: Normal rate.  Pulmonary:     Effort: Pulmonary effort is normal.  Genitourinary:    Comments: Declined by patient since no more VB Musculoskeletal:        General: Normal range of motion.  Skin:    General: Skin is warm and dry.  Neurological:     Mental Status: She is alert and oriented to person, place, and time.  Psychiatric:        Mood and Affect: Mood normal.        Behavior: Behavior normal.        Thought Content: Thought content normal.        Judgment: Judgment normal.    MAU Course  Procedures  MDM CCUA UPT CBC ABO/Rh -- not drawn, known O NEG HCG Wet Prep -- by self-swab while waiting in lobby GC/CT -- pending HIV -- pending OB < 14 wks [place] with TV Rhophylac w/u Rhophylac 300 mcg IM injection  Results for orders placed or performed  during the hospital encounter of 08/31/20 (from the past 24 hour(s))  Pregnancy, urine POC     Status: Abnormal   Collection Time: 08/31/20  5:27 AM  Result Value Ref Range   Preg Test, Ur POSITIVE (A) NEGATIVE  Wet prep, genital     Status: Abnormal   Collection Time: 08/31/20  5:45 AM  Result Value Ref Range   Yeast Wet Prep HPF POC NONE SEEN NONE SEEN   Trich, Wet Prep NONE SEEN NONE SEEN   Clue Cells Wet Prep HPF POC NONE SEEN NONE SEEN   WBC, Wet Prep HPF POC MANY (A) NONE SEEN   Sperm NONE SEEN   CBC     Status: Abnormal   Collection Time: 08/31/20  6:21 AM  Result Value Ref Range   WBC 7.3 4.0 - 10.5 K/uL   RBC 4.45 3.87 - 5.11 MIL/uL   Hemoglobin 10.9 (L) 12.0 - 15.0 g/dL   HCT 33.3 (L) 36.0 - 46.0 %   MCV 74.8 (L) 80.0 - 100.0 fL   MCH 24.5 (L) 26.0 - 34.0 pg   MCHC 32.7 30.0 - 36.0 g/dL   RDW 16.4 (H) 11.5 - 15.5 %   Platelets 366 150 - 400 K/uL   nRBC 0.0 0.0 - 0.2 %  hCG, quantitative, pregnancy     Status: Abnormal   Collection Time: 08/31/20  6:21 AM  Result Value Ref Range   hCG, Beta Chain, Quant, S 60,442 (H) <5 mIU/mL  Rh IG workup (includes ABO/Rh)     Status: None   Collection Time: 08/31/20  6:21 AM  Result Value Ref Range   Gestational Age(Wks) 6.4    ABO/RH(D)      O NEG Performed at [facility], [address], [place], [place] [address]     US OB LESS THAN 14 WEEKS WITH [place] TRANSVAGINAL  Result Date: 08/31/2020 CLINICAL DATA:  35 year old female with vaginal bleeding in the 1st trimester of pregnancy. Estimated gestational age by LMP 6 weeks and 4 days. EXAM: OBSTETRIC <14 WK [place] AND TRANSVAGINAL OB US TECHNIQUE: Both transabdominal and transvaginal ultrasound examinations were performed for complete evaluation of the gestation as well as the maternal uterus, adnexal regions, and pelvic cul-de-sac. Transvaginal technique was performed to assess early pregnancy.  COMPARISON:  None relevant. FINDINGS: Intrauterine gestational sac: Single Yolk sac:   Visible Embryo:  Visible Cardiac Activity: Detected Heart Rate: 138 bpm CRL: 8.5 mm   6 w   5 d                  [place] EDC: 04/21/2021 Subchorionic hemorrhage:  None visualized. Maternal uterus/adnexae: The right ovary appears normal measuring 1.0 x 2.4 x 1.4 cm. The left ovary appears normal measuring 2.8 x 1.7 x 1.6 cm. There is trace simple appearing free fluid in the cul-de-sac (image 61). IMPRESSION: Single living IUP demonstrated. No acute maternal findings visualized. Electronically Signed   By: Genevie Ann M.D.   On: 08/31/2020 06:11     Assessment and Plan  Bleeding in early pregnancy  - Information provided on VB in early pregnancy - Return to MAU: If you have heavier bleeding that soaks through more that 2 pads per hour for an hour or more If you bleed so much that you feel like you might pass out or you do pass out If you have significant abdominal pain that is not improved with Tylenol 1000 mg every 6 hours as needed for pain If you develop a fever > 100.5    Rh negative state in antepartum period, first trimester  - Rhophylac received in MAU  [redacted] weeks gestation of pregnancy   - Discharge home - Advised to call [facility] to establish [facility] - Patient verbalized an understanding of the plan of care and agrees.   Laury Deep, CNM 08/31/2020, 7:27 AM

## 2020-08-31 NOTE — Discharge Instructions (Signed)
Return to MAU: If you have heavier bleeding that soaks through more that 2 pads per hour for an hour or more If you bleed so much that you feel like you might pass out or you do pass out If you have significant abdominal pain that is not improved with Tylenol 1000 mg every 6 hours as needed for pain If you develop a fever > 100.5

## 2020-09-01 LAB — RH IG WORKUP (INCLUDES ABO/RH)
ABO/RH(D): O NEG
Antibody Screen: NEGATIVE
Gestational Age(Wks): 6.4
Unit division: 0

## 2020-09-23 LAB — OB RESULTS CONSOLE RPR: RPR: NONREACTIVE

## 2020-09-28 LAB — OB RESULTS CONSOLE RUBELLA ANTIBODY, IGM: Rubella: IMMUNE

## 2020-09-28 LAB — OB RESULTS CONSOLE ABO/RH: RH Type: NEGATIVE

## 2020-09-28 LAB — HEPATITIS C ANTIBODY: HCV Ab: NEGATIVE

## 2020-09-28 LAB — OB RESULTS CONSOLE HIV ANTIBODY (ROUTINE TESTING): HIV: NONREACTIVE

## 2020-09-28 LAB — OB RESULTS CONSOLE HEPATITIS B SURFACE ANTIGEN: Hepatitis B Surface Ag: NEGATIVE

## 2020-11-01 ENCOUNTER — Other Ambulatory Visit: Payer: Self-pay | Admitting: Obstetrics and Gynecology

## 2020-11-01 DIAGNOSIS — O1211 Gestational proteinuria, first trimester: Secondary | ICD-10-CM

## 2020-11-01 DIAGNOSIS — Z363 Encounter for antenatal screening for malformations: Secondary | ICD-10-CM

## 2020-11-01 NOTE — Progress Notes (Unsigned)
012.11  

## 2020-11-22 ENCOUNTER — Ambulatory Visit (HOSPITAL_BASED_OUTPATIENT_CLINIC_OR_DEPARTMENT_OTHER): Payer: BC Managed Care – PPO | Admitting: Obstetrics

## 2020-11-22 ENCOUNTER — Ambulatory Visit: Payer: BC Managed Care – PPO | Attending: Obstetrics and Gynecology

## 2020-11-22 ENCOUNTER — Other Ambulatory Visit: Payer: Self-pay

## 2020-11-22 ENCOUNTER — Ambulatory Visit: Payer: BC Managed Care – PPO | Admitting: *Deleted

## 2020-11-22 ENCOUNTER — Other Ambulatory Visit: Payer: Self-pay | Admitting: *Deleted

## 2020-11-22 ENCOUNTER — Encounter: Payer: Self-pay | Admitting: *Deleted

## 2020-11-22 VITALS — BP 128/80 | HR 88

## 2020-11-22 DIAGNOSIS — O10912 Unspecified pre-existing hypertension complicating pregnancy, second trimester: Secondary | ICD-10-CM | POA: Diagnosis not present

## 2020-11-22 DIAGNOSIS — Z363 Encounter for antenatal screening for malformations: Secondary | ICD-10-CM | POA: Insufficient documentation

## 2020-11-22 DIAGNOSIS — Z3A18 18 weeks gestation of pregnancy: Secondary | ICD-10-CM

## 2020-11-22 DIAGNOSIS — O99212 Obesity complicating pregnancy, second trimester: Secondary | ICD-10-CM

## 2020-11-22 DIAGNOSIS — O1212 Gestational proteinuria, second trimester: Secondary | ICD-10-CM | POA: Diagnosis not present

## 2020-11-22 DIAGNOSIS — O1211 Gestational proteinuria, first trimester: Secondary | ICD-10-CM | POA: Diagnosis not present

## 2020-11-22 IMAGING — US US MFM OB DETAIL+14 WK
1 series · 12 of 28 positions shown · non-contrast
Comparison: none

[Series 1: us mfm ob detail+14 wk · 106 acquisitions, 12 frames shown]
[im 4/106]
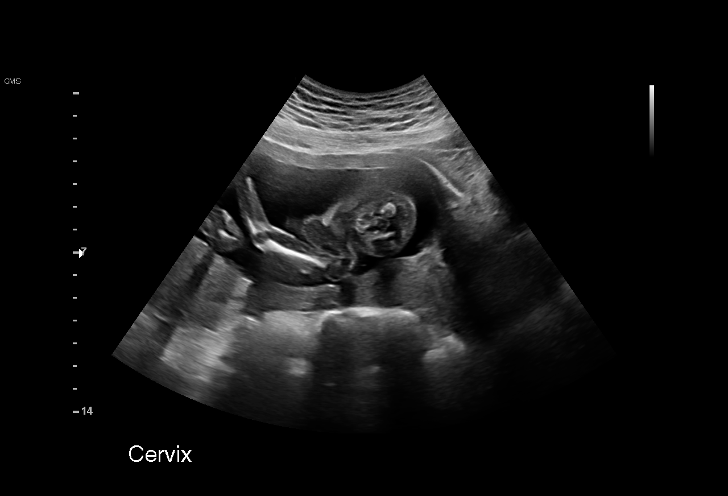
[im 12/106]
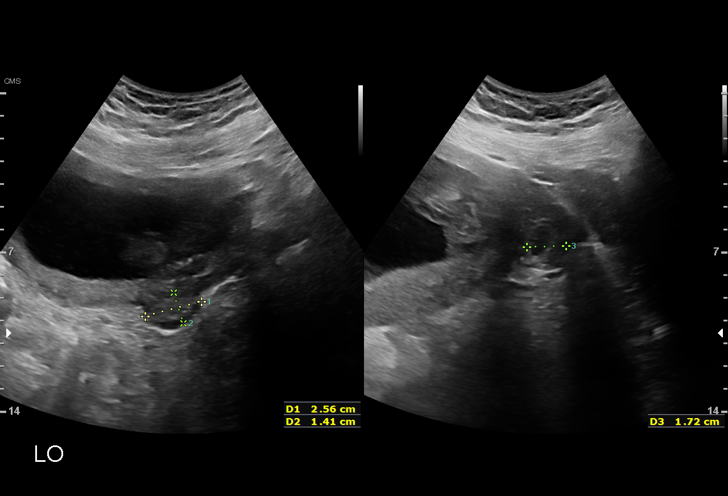
[im 20/106]
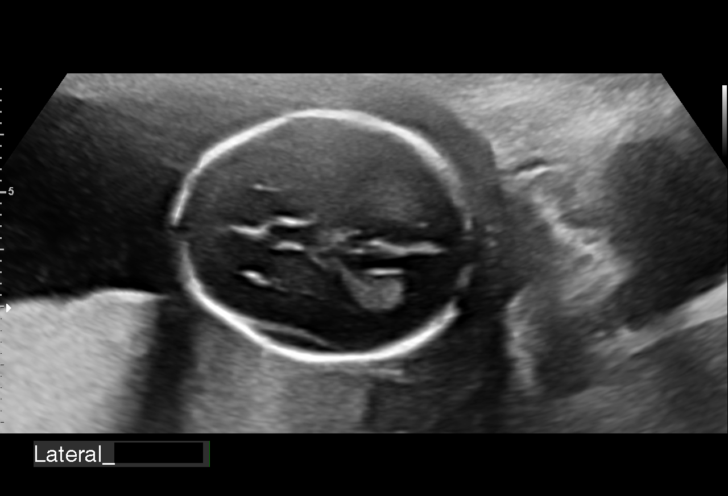
[im 32/106]
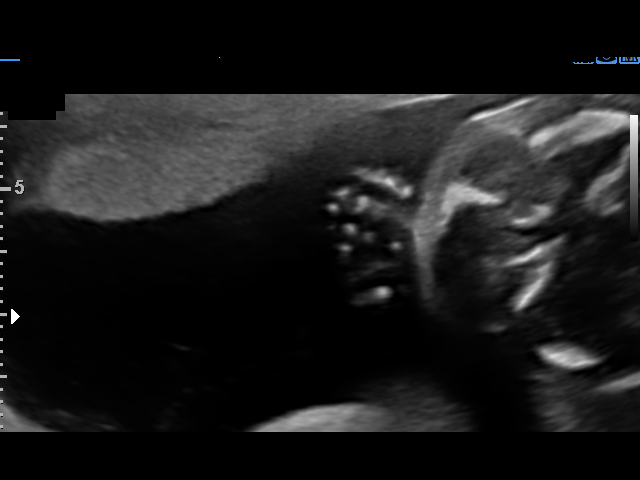
[im 39/106]
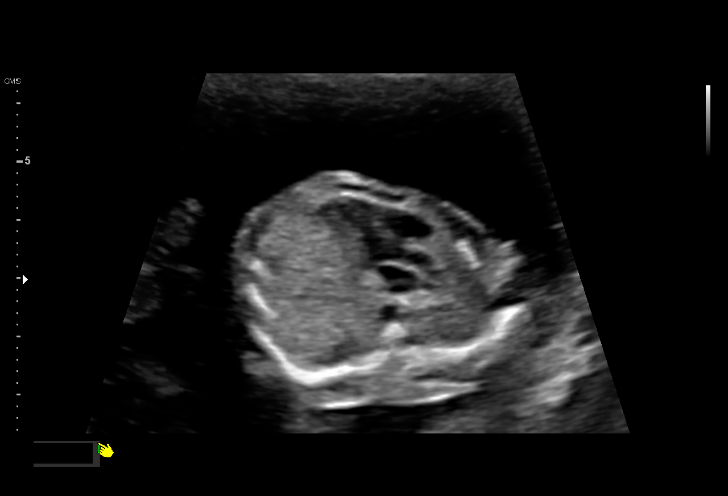
[im 47/106]
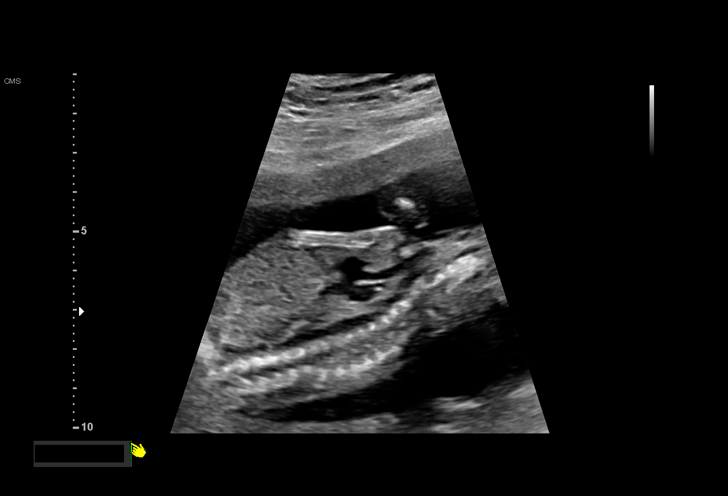
[im 59/106]
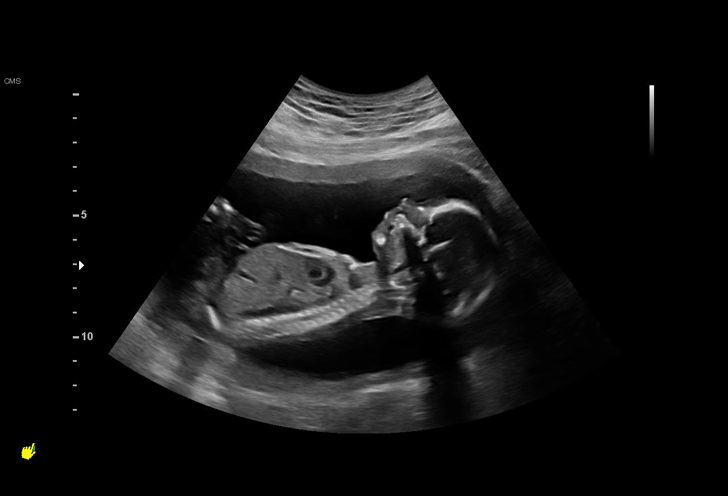
[im 67/106]
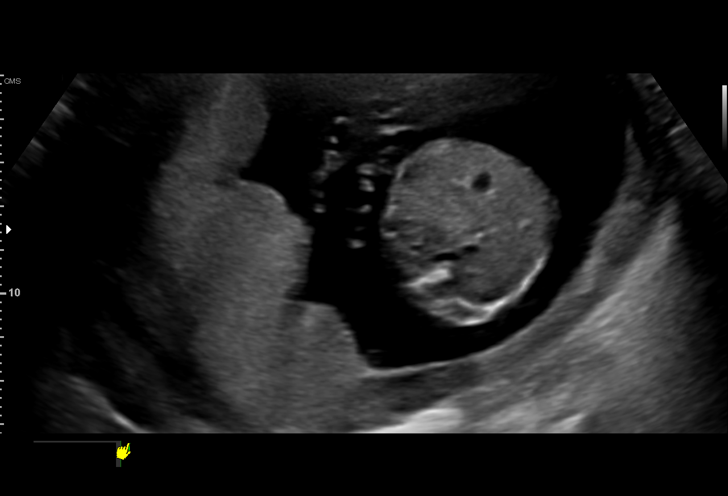
[im 74/106]
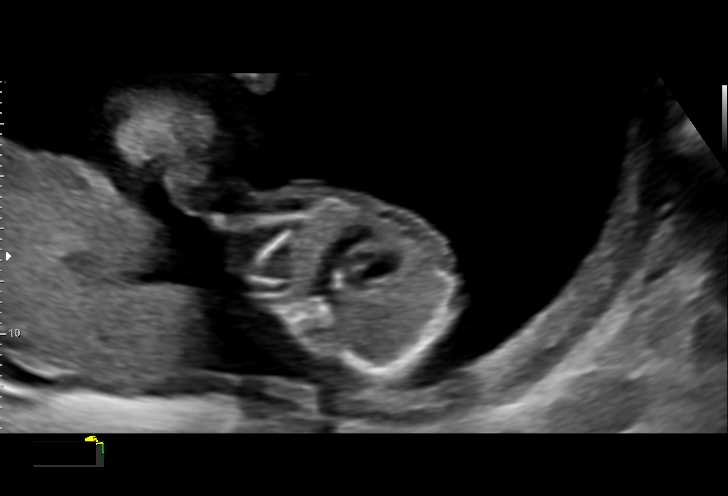
[im 86/106]
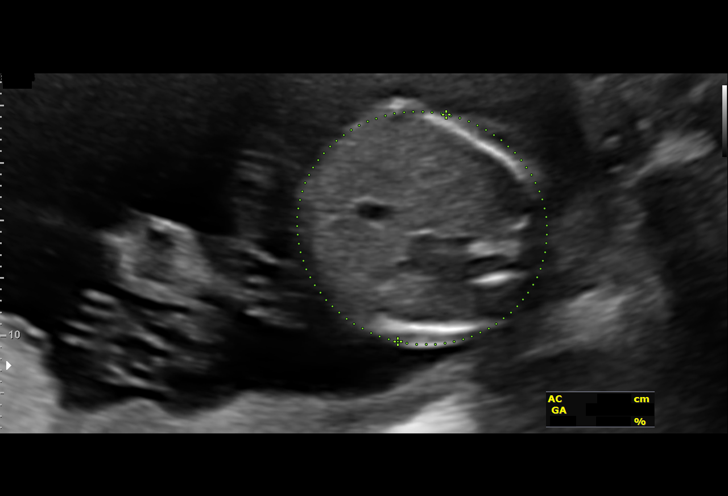
[im 94/106]
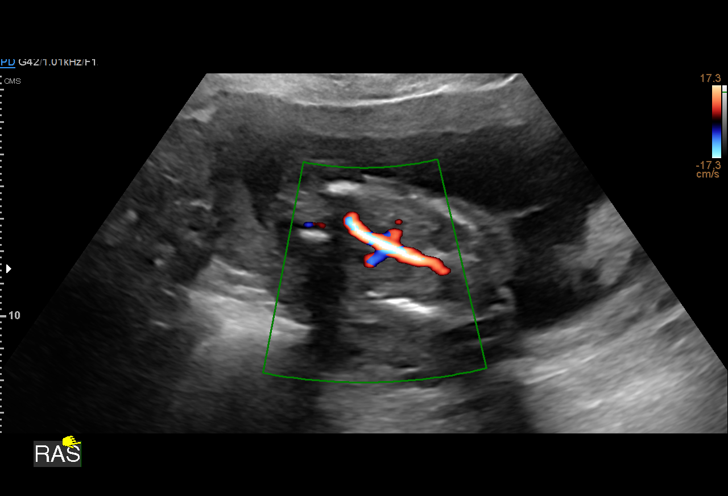
[im 102/106]
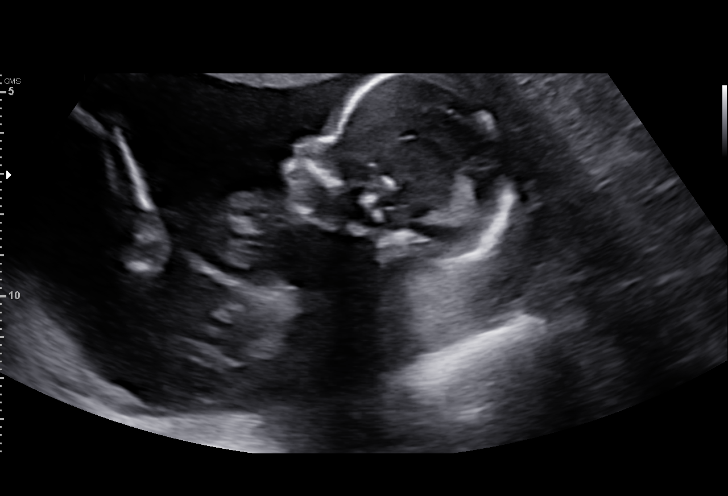

[12 of 28 positions shown; findings below may reference images not displayed]

OB/GYN

                                                      GANABA

Indications

 Hypertension - Chronic/Pre-existing (dx        [9E]
 [DATE])
 Obesity complicating pregnancy, second         [9E]
 trimester (pregravid BMI 45)
 18 weeks gestation of pregnancy
 Antenatal screening for malformations          [9E]
 Poor obstetric history: Previous               [9E]
 preeclampsia / eclampsia/gestational HTN
 Rh negative state in antepartum (Anti-D - too  [9E]
 weak to titer)
 Neg AFP; NIPS - no result
Fetal Evaluation

 Num Of Fetuses:         1
 Fetal Heart Rate(bpm):  157
 Cardiac Activity:       Observed
 Presentation:           Cephalic
 Placenta:               Posterior
 P. Cord Insertion:      Visualized

 Amniotic Fluid
 AFI FV:      Within normal limits

                             Largest Pocket(cm)

Biometry

 BPD:      44.1  mm     G. Age:  19w 2d         85  %    CI:        77.74   %    70 - 86
                                                         FL/HC:      18.4   %    15.8 - 18
 HC:      158.3  mm     G. Age:  18w 5d         56  %    HC/AC:      1.17        1.07 -
 AC:      134.8  mm     G. Age:  19w 0d         65  %    FL/BPD:     66.0   %
 FL:       29.1  mm     G. Age:  19w 0d         64  %    FL/AC:      21.6   %    20 - 24
 HUM:      27.3  mm     G. Age:  18w 5d         61  %
 CER:      19.4  mm     G. Age:  18w 6d         69  %
 NFT:       3.2  mm

 LV:        6.1  mm
 CM:        5.1  mm

 Est. FW:     265  gm      0 lb 9 oz     76  %
OB History

 Gravidity:    5         Term:   3        Prem:   0        SAB:   0
 TOP:          1       Ectopic:  0        Living: 3
Gestational Age

 LMP:           18w 3d        Date:  [DATE]                 EDD:   [DATE]
 U/S Today:     19w 0d                                        EDD:   [DATE]
 Best:          18w 3d     Det. By:  LMP  ([DATE])          EDD:   [DATE]
Anatomy

 Cranium:               Appears normal         LVOT:                   Appears normal
 Cavum:                 Appears normal         Aortic Arch:            Appears normal
 Ventricles:            Appears normal         Ductal Arch:            Appears normal
 Choroid Plexus:        Appears normal         Diaphragm:              Appears normal
 Cerebellum:            Appears normal         Stomach:                Appears normal, left
                                                                       sided
 Posterior Fossa:       Appears normal         Abdomen:                Appears normal
 Nuchal Fold:           Appears normal         Abdominal Wall:         Appears nml (cord
                                                                       insert, abd wall)
 Face:                  Appears normal         Cord Vessels:           Appears normal (3
                        (orbits and profile)                           vessel cord)
 Lips:                  Appears normal         Kidneys:                Appear normal
 Palate:                Not well visualized    Bladder:                Appears normal
 Thoracic:              Appears normal         Spine:                  Appears normal
 Heart:                 Appears normal         Upper Extremities:      Appears normal
                        (4CH, axis, and
                        situs)
 RVOT:                  Appears normal         Lower Extremities:      Appears normal
Targeted Anatomy

 Central Nervous System
 Cereb./Vermis:         Appears normal

 Head/Neck
 Nasal Bone:            Present                Mandible:               Appears normal
 Orbits/Eyes:           Nml w/ lenses          Maxilla:                Appears normal
 Thorax
 SVC:                   Appears normal         3 V Trachea View:       Appears normal
 3 Vessel View:         Appears normal         IVC:                    Appears normal

 Extremities
 Lt Hand:               Open hand nml          Lt Foot:                Nml heel/foot
 Rt Hand:               Open hand nml          Rt Foot:                Nml heel/foot

 Other
 Genitalia:             Normal Male
Cervix Uterus Adnexa

 Cervix
 Length:           3.57  cm.
 Normal appearance by transabdominal scan.

 Uterus
 No abnormality visualized.

 Right Ovary
 Not visualized.

 Left Ovary
 Within normal limits.

 Cul De Sac
 No free fluid seen.

 Adnexa
 No abnormality visualized.
Comments

 GANABA GANABA was seen for a detailed fetal anatomy scan due to
 maternal obesity with a BMI of 45, chronic hypertension that
 is not treated with any medications, and possible chronic
 kidney disease.  The patient had a 24-hour urine collection
 that showed 779 mg of protein.  Her serum creatinine level
 was within normal limits.  She was seen by a nephrologist
 who did not recommend any treatment or further work-up
 during her pregnancy.
 She denies any other significant past medical history and
 denies any problems in her current pregnancy.
 The results of her cell free DNA test could not be interpreted.
 The patient reports that she will be traveling to the [facility] in
 [REDACTED] [place] next week to participate in a study
 looking at cell free DNA tests that are unable to be
 interpreted.  They will repeat the cell free DNA test at that
 time.
 She was informed that the fetal growth and amniotic fluid
 level were appropriate for her gestational age.
 There were no obvious fetal anomalies noted on today's
 ultrasound exam.  However, today's exam was limited due to
 the maternal body habitus.
 The patient was informed that anomalies may be missed due
 to technical limitations. If the fetus is in a suboptimal position
 or maternal habitus is increased, visualization of the fetus in
 the maternal uterus may be impaired.
 The following were discussed today:
 Chronic hypertension with proteinuria
 The increased risk of preeclampsia due to chronic
 hypertension with proteinuria was discussed. She developed
 preeclampsia during her prior pregnancies.  She was advised
 to continue taking a daily baby aspirin for preeclampsia
 prophylaxis.
 The increased risk of IUGR associated with chronic
 hypertension with proteinuria was discussed.  We will
 continue to follow her with monthly growth ultrasounds
 throughout her pregnancy.
 Weekly fetal testing should be started at around 32 weeks.
 The patient was advised that the goal for delivery would be at
 37 weeks or greater.
 A follow-up exam was scheduled in our office in 4 weeks.
 The patient stated that all of her questions have been
 answered.
 A total of 30 minutes was spent counseling and coordinating
 the care for this patient.  Greater than 50% of the time was
 spent in direct face-to-face contact.

## 2020-11-22 NOTE — Progress Notes (Signed)
MFM Note  Evelyn Marshall was seen for a detailed fetal anatomy scan due to maternal obesity with a BMI of 45, chronic hypertension that is not treated with any medications, and possible chronic kidney disease.  The patient had a 24-hour urine collection that showed 779 mg of protein.  Her serum creatinine level was within normal limits.  She was seen by a nephrologist who did not recommend any treatment or further work-up during her pregnancy.  She denies any other significant past medical history and denies any problems in her current pregnancy.    The results of her cell free DNA test could not be interpreted.  The patient reports that she will be traveling to the [place] in [place] next week to participate in a study looking at cell free DNA tests that are unable to be interpreted.  They will repeat the cell free DNA test at that time.    She was informed that the fetal growth and amniotic fluid level were appropriate for her gestational age.   There were no obvious fetal anomalies noted on today's ultrasound exam.  However, today's exam was limited due to the maternal body habitus.  The patient was informed that anomalies may be missed due to technical limitations. If the fetus is in a suboptimal position or maternal habitus is increased, visualization of the fetus in the maternal uterus may be impaired.  The following were discussed today:  Chronic hypertension with proteinuria  The increased risk of preeclampsia due to chronic hypertension with proteinuria was discussed. She developed preeclampsia during her prior pregnancies.  She was advised to continue taking a daily baby aspirin for preeclampsia prophylaxis.  The increased risk of IUGR associated with chronic hypertension with proteinuria was discussed.  We will continue to follow her with monthly growth ultrasounds throughout her pregnancy.  Weekly fetal testing should be started at around 32 weeks.    The patient was advised  that the goal for delivery would be at 37 weeks or greater.  A follow-up exam was scheduled in our office in 4 weeks.    The patient stated that all of her questions have been answered.  A total of 30 minutes was spent counseling and coordinating the care for this patient.  Greater than 50% of the time was spent in direct face-to-face contact.

## 2020-12-08 ENCOUNTER — Telehealth: Payer: Self-pay | Admitting: Hematology and Oncology

## 2020-12-08 NOTE — Telephone Encounter (Signed)
Scheduled appt per 11/16 staff msg from [facility]. Pt is aware of appt date and time.

## 2020-12-14 NOTE — Progress Notes (Signed)
[facility] NOTE  Patient Care Team: [name], MD as PCP - General (Family Medicine)  CHIEF COMPLAINTS/PURPOSE OF CONSULTATION:  Newly diagnosed right breast mass   HISTORY OF PRESENTING ILLNESS:  Evelyn Marshall 35 y.o. female is here because of recent diagnosis of right breast mass. She is currently pregnant and presented with abnormal prenatal labs at 20 weeks. Full body MRI at [place] on 11/26/2020 showed right axillary and subpectoral adenopathy and left supraclavicular lymphadenopathy. Diagnostic mammogram and [place] on 12/08/2020 showed a 1.2 cm mass in the upper outer right breast, and a 0.8 cm irregular focal asymmetry in the right breast a 3:00.  There are also 10 cm of pleomorphic calcifications of unknown etiology.  She presents to the clinic today for initial evaluation and discussion of treatment options.   I reviewed her records extensively and collaborated the history with the patient.   MEDICAL HISTORY:  Past Medical History:  Diagnosis Date   Abnormal Pap smear 2012   colpo result benign   Chlamydia    History of pre-eclampsia    HPV (human papilloma virus) infection    Hyperemesis    Hypokalemia    Pregnancy induced hypertension    Ptyalism    Vaginal Pap smear, abnormal     SURGICAL HISTORY: Past Surgical History:  Procedure Laterality Date   COLPOSCOPY     WISDOM TOOTH EXTRACTION      SOCIAL HISTORY: Social History   Socioeconomic History   Marital status: Married    Spouse name: Not on file   Number of children: Not on file   Years of education: 16   Highest education level: Bachelor's degree (e.g., BA, AB, BS)  Occupational History   Occupation: Pharmacist, hospital  Tobacco Use   Smoking status: Never   Smokeless tobacco: Never  Vaping Use   Vaping Use: Never used  Substance and Sexual Activity   Alcohol use: No   Drug use: No   Sexual activity: Yes  Other Topics Concern   Not on file  Social History Narrative   Not on file    Social Determinants of Health   Financial Resource Strain: Not on file  Food Insecurity: Not on file  Transportation Needs: Not on file  Physical Activity: Not on file  Stress: Not on file  Social Connections: Not on file  Intimate Partner Violence: Not on file    FAMILY HISTORY: Family History  Problem Relation Age of Onset   Heart disease Father    Hypertension Father    Stroke Father    Breast cancer Paternal Grandmother    Stroke Paternal Grandmother     ALLERGIES:  has No Known Allergies.  MEDICATIONS:  Current Outpatient Medications  Medication Sig Dispense Refill   aspirin EC 81 MG tablet Take 81 mg by mouth daily. Swallow whole.     doxylamine, Sleep, (UNISOM) 25 MG tablet Take 25 mg by mouth at bedtime as needed.     Fe Fum-FePoly-Vit C-Vit B3 (INTEGRA) 62.5-62.5-40-3 MG CAPS Take 1 capsule by mouth daily. (Patient not taking: Reported on 11/22/2020)     Prenatal Vit-Fe Fumarate-FA (PRENATAL MULTIVITAMIN) TABS tablet Take 1 tablet by mouth daily at 12 noon.     pyridOXINE (VITAMIN B-6) 25 MG tablet Take 25 mg by mouth daily.     No current facility-administered medications for this visit.    REVIEW OF SYSTEMS:   Constitutional: Denies fevers, chills or abnormal night sweats Eyes: Denies blurriness of vision, double vision  or watery eyes Ears, nose, mouth, throat, and face: Denies mucositis or sore throat Respiratory: Denies cough, dyspnea or wheezes Cardiovascular: Denies palpitation, chest discomfort or lower extremity swelling Gastrointestinal:  Denies nausea, heartburn or change in bowel habits Skin: Denies abnormal skin rashes Lymphatics: Denies new lymphadenopathy or easy bruising Neurological:Denies numbness, tingling or new weaknesses Behavioral/Psych: Mood is stable, no new changes    All other systems were reviewed with the patient and are negative.  PHYSICAL EXAMINATION: ECOG PERFORMANCE STATUS: 1 - Symptomatic but completely  ambulatory  Vitals:   12/15/20 1230  BP: 139/73  Pulse: (!) 101  Resp: 17  Temp: (!) 97.5 F (36.4 C)  SpO2: 100%   Filed Weights   12/15/20 1230  Weight: 284 lb 1.6 oz (128.9 kg)      LABORATORY DATA:  I have reviewed the data as listed Lab Results  Component Value Date   WBC 7.3 08/31/2020   HGB 10.9 (L) 08/31/2020   HCT 33.3 (L) 08/31/2020   MCV 74.8 (L) 08/31/2020   PLT 366 08/31/2020   Lab Results  Component Value Date   NA 138 02/28/2019   K 3.9 02/28/2019   CL 106 02/28/2019   CO2 23 02/28/2019    RADIOGRAPHIC STUDIES: I have personally reviewed the radiological reports and agreed with the findings in the report.  ASSESSMENT AND PLAN:  Malignant neoplasm of upper-outer quadrant of right breast in female, estrogen receptor positive ([place]) 12/08/2020:21-week pregnancy: MRI at [facility] noncontrast revealed right axillary mass 3.7 cm, subpectoral lymph nodes, left supraclavicular lymph node, multiple level 2 and 3 lymph nodes in the right axilla, 2 breast masses 1.1 cm largest size.  Mammogram in [place] revealed 3 small masses at 12:00 1.2 cm, 0.6 cm, 0.9 cm and 5 abnormal axillary lymph nodes, pleomorphic calcifications measuring 10 cm, biopsy revealed grade 2 IDC with necrosis and calcifications ER 30%, PR 0%, HER2 positive, Ki-67 30 to 40%  Pathology and radiology counseling: Discussed with the patient, the details of pathology including the type of breast cancer,the clinical staging, the significance of ER, PR and HER-2/neu receptors and the implications for treatment. After reviewing the pathology in detail, we proceeded to discuss the different treatment options between surgery, radiation, chemotherapy, antiestrogen therapies.  Treatment plan: 1.  Neoadjuvant chemotherapy with Adriamycin and Cytoxan every 3 weeks x4 2. delivery of the baby 3.  Continue neoadjuvant therapy with Taxol Herceptin and Perjeta 4.  Mastectomy with ALND 5.  Adjuvant radiation  therapy 6.  Adjuvant antiestrogen therapy with ovarian function suppression Genetic testing  Chemotherapy Counseling: I discussed the risks and benefits of chemotherapy including the risks of nausea/ vomiting, risk of infection from low WBC count, fatigue due to chemo or anemia, bruising or bleeding due to low platelets, mouth sores, loss/ change in taste and decreased appetite. Liver and kidney function will be monitored through out chemotherapy as abnormalities in liver and kidney function may be a side effect of treatment. Cardiac dysfunction due to Adriamycin and Herceptin and Perjeta were discussed in detail. Risk of permanent bone marrow dysfunction and leukemia and neuropathy risk from Taxol were also discussed.  Plan: 1.  Biopsy of the left supraclavicular lymph node 2. biopsy of the calcifications 3.  echocardiogram, chemo class Start chemo in 1 week  Whole-body MRI did not show any evidence of distant metastatic disease and therefore the only solitary distant met is in the supraclavicular lymph node.  Therefore we decided to treat her aggressively with curative intent treatment  plan.   All questions were answered. The patient knows to call the clinic with any problems, questions or concerns.   Rulon Eisenmenger, MD, MPH 12/15/2020    I, Thana Ates, am acting as scribe for Nicholas Lose, MD.  I have reviewed the above documentation for accuracy and completeness, and I agree with the above.

## 2020-12-15 ENCOUNTER — Inpatient Hospital Stay (HOSPITAL_BASED_OUTPATIENT_CLINIC_OR_DEPARTMENT_OTHER): Payer: BC Managed Care – PPO | Admitting: Hematology and Oncology

## 2020-12-15 ENCOUNTER — Inpatient Hospital Stay: Payer: BC Managed Care – PPO | Attending: Hematology and Oncology

## 2020-12-15 ENCOUNTER — Inpatient Hospital Stay (HOSPITAL_BASED_OUTPATIENT_CLINIC_OR_DEPARTMENT_OTHER): Payer: BC Managed Care – PPO | Admitting: Genetic Counselor

## 2020-12-15 ENCOUNTER — Other Ambulatory Visit: Payer: Self-pay | Admitting: Genetic Counselor

## 2020-12-15 ENCOUNTER — Other Ambulatory Visit: Payer: Self-pay

## 2020-12-15 ENCOUNTER — Other Ambulatory Visit: Payer: Self-pay | Admitting: *Deleted

## 2020-12-15 ENCOUNTER — Encounter: Payer: Self-pay | Admitting: Genetic Counselor

## 2020-12-15 DIAGNOSIS — Z170 Estrogen receptor positive status [ER+]: Secondary | ICD-10-CM

## 2020-12-15 DIAGNOSIS — C50411 Malignant neoplasm of upper-outer quadrant of right female breast: Secondary | ICD-10-CM | POA: Insufficient documentation

## 2020-12-15 DIAGNOSIS — Z803 Family history of malignant neoplasm of breast: Secondary | ICD-10-CM

## 2020-12-15 HISTORY — DX: Family history of malignant neoplasm of breast: Z80.3

## 2020-12-15 LAB — GENETIC SCREENING ORDER

## 2020-12-15 NOTE — Addendum Note (Signed)
Addended by: Ignacia Bayley on: 12/15/2020 12:42 PM   Modules accepted: Orders

## 2020-12-15 NOTE — Assessment & Plan Note (Addendum)
12/08/2020:21-week pregnancy: MRI at [facility] noncontrast revealed right axillary mass 3.7 cm, subpectoral lymph nodes, left supraclavicular lymph node, multiple level 2 and 3 lymph nodes in the right axilla, 2 breast masses 1.1 cm largest size.  Mammogram in [place] revealed 3 small masses at 12:00 1.2 cm, 0.6 cm, 0.9 cm and 5 abnormal axillary lymph nodes, pleomorphic calcifications measuring 10 cm, biopsy revealed grade 2 IDC with necrosis and calcifications ER 30%, PR 0%, HER2 positive, Ki-67 30 to 40%  Pathology and radiology counseling: Discussed with the patient, the details of pathology including the type of breast cancer,the clinical staging, the significance of ER, PR and HER-2/neu receptors and the implications for treatment. After reviewing the pathology in detail, we proceeded to discuss the different treatment options between surgery, radiation, chemotherapy, antiestrogen therapies.  Treatment plan: 1.  Neoadjuvant chemotherapy with Adriamycin and Cytoxan every 3 weeks x4 2. delivery of the baby 3.  Continue neoadjuvant therapy with Taxol Herceptin and Perjeta 4.  Mastectomy with ALND 5.  Adjuvant radiation therapy 6.  Adjuvant antiestrogen therapy with ovarian function suppression Genetic testing  Chemotherapy Counseling: I discussed the risks and benefits of chemotherapy including the risks of nausea/ vomiting, risk of infection from low WBC count, fatigue due to chemo or anemia, bruising or bleeding due to low platelets, mouth sores, loss/ change in taste and decreased appetite. Liver and kidney function will be monitored through out chemotherapy as abnormalities in liver and kidney function may be a side effect of treatment. Cardiac dysfunction due to Adriamycin and Herceptin and Perjeta were discussed in detail. Risk of permanent bone marrow dysfunction and leukemia and neuropathy risk from Taxol were also discussed.  Plan: 1.  Biopsy of the left supraclavicular lymph node 2. biopsy  of the calcifications 3.  echocardiogram, chemo class Start chemo in 1 week  Whole-body MRI did not show any evidence of distant metastatic disease and therefore the only solitary distant met is in the supraclavicular lymph node.  Therefore we decided to treat her aggressively with curative intent treatment plan.

## 2020-12-15 NOTE — Progress Notes (Signed)
error 

## 2020-12-15 NOTE — Progress Notes (Signed)
REFERRING PROVIDER: Nicholas Lose, MD [place],  [place] [id number]  PRIMARY PROVIDER:  [name], MD  PRIMARY REASON FOR VISIT:  1. Malignant neoplasm of upper-outer quadrant of right breast in female, estrogen receptor positive ([place])   2. Family history of breast cancer      HISTORY OF PRESENT ILLNESS:   Evelyn Marshall, a 35 y.o. female, was seen for a [facility] cancer genetics consultation at the request of Dr. Lindi Adie due to a personal history of cancer.  Evelyn Marshall presents to clinic today to discuss the possibility of a hereditary predisposition to cancer, to discuss genetic testing, and to further clarify her future cancer risks, as well as potential cancer risks for family members.   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 The treatment plan is pending.   CANCER HISTORY:  Oncology History  Malignant neoplasm of upper-outer quadrant of right breast in female, estrogen receptor positive ([place])  12/08/2020 Initial Diagnosis   21-week pregnancy: MRI at [facility] noncontrast revealed right axillary mass 3.7 cm, subpectoral lymph nodes, left supraclavicular lymph node, multiple level 2 and 3 lymph nodes in the right axilla, 2 breast masses 1.1 cm largest size.  Mammogram in [place] revealed 3 small masses at 12:00 1.2 cm, 0.6 cm, 0.9 cm and 5 abnormal axillary lymph nodes, pleomorphic calcifications measuring 10 cm, biopsy revealed grade 2 IDC with necrosis and calcifications ER 30%, PR 0%, HER2 positive, Ki-67 30 to 40%   12/15/2020 Cancer Staging   Staging form: Breast, AJCC 8th Edition - Clinical: Stage IV (cT3, cN2, pM1, G2, ER+, PR-, HER2+) - Signed by Nicholas Lose, MD on 12/15/2020 Histologic grading system: 3 grade system    12/22/2020 -  Chemotherapy   Patient is on Treatment Plan : BREAST Adjuvant AC q21d       RISK FACTORS:  Menarche was at age 4.  First live  birth at age 65.  OCP use for approximately 0 years.  Ovaries intact: yes.  Hysterectomy: no.  Menopausal status: premenopausal.  HRT use: 0 years. Colonoscopy: no; not examined. Mammogram within the last year: yes. Up to date with pelvic exams: yes. Any excessive radiation exposure in the past: no  Past Medical History:  Diagnosis Date   Abnormal Pap smear 2012   colpo result benign   Chlamydia    Family history of breast cancer 12/15/2020   History of pre-eclampsia    HPV (human papilloma virus) infection    Hyperemesis    Hypokalemia    Pregnancy induced hypertension    Ptyalism    Vaginal Pap smear, abnormal     Past Surgical History:  Procedure Laterality Date   COLPOSCOPY     WISDOM TOOTH EXTRACTION          FAMILY HISTORY:  We obtained a detailed, 4-generation family history.  Significant diagnoses are listed below: Family History  Problem Relation Age of Onset   Heart disease Father    Hypertension Father    Stroke Father    Breast cancer Paternal Grandmother        dx 26s   Stroke Paternal Grandmother    Breast cancer Cousin 17       maternal female cousin     Evelyn Marshall has three sons--ages 16, 5, and 2.  She is approximately [redacted] weeks pregnant.  She has one brother and one sister, both in their 21s.  No  cancer reported in her children, siblings, or nieces/nephews.   Evelyn Marshall mother is 18 and has not had cancer.  Evelyn Marshall reported one maternal cousin diagnosed with breast cancer at the age of 12.  Evelyn Marshall father is 57 and has not had cancer.  Evelyn Marshall reported her paternal grandmother was diagnosed with breast cancer in her 47s.  No other family history of cancer was reported.   Evelyn Marshall is unaware of previous family history of genetic testing for hereditary cancer risks. There is no reported Ashkenazi Jewish ancestry. There is no known consanguinity.  GENETIC COUNSELING ASSESSMENT: Evelyn Marshall is a 35 y.o. female with a personal and family  history of cancer which is somewhat suggestive of a hereditary cancer syndrome and predisposition to cancer given her age of diagnosis and the presence of related cancers in the family. We, therefore, discussed and recommended the following at today's visit.   DISCUSSION: We discussed that 5 - 10% of cancer is hereditary, with most cases of hereditary breast cancer associated with mutations in BRCA1/2.  There are other genes that can be associated with hereditary breast cancer syndromes.  Type of cancer risk and level of risk are gene-specific. We discussed that testing is beneficial for several reasons including knowing how to follow individuals after completing their treatment, identifying whether potential treatment options would be beneficial, and understanding if other family members could be at risk for cancer and allowing them to undergo genetic testing.   We reviewed the characteristics, features and inheritance patterns of hereditary cancer syndromes. We also discussed genetic testing, including the appropriate family members to test, the process of testing, insurance coverage and turn-around-time for results. We discussed the implications of a negative, positive and/or variant of uncertain significant result. In order to get genetic test results in a timely manner so that Evelyn Marshall can use these genetic test results for surgical decisions, we recommended Evelyn Marshall pursue genetic testing for the [facility] BRCAPlus Panel. Once complete, we recommend Evelyn Marshall pursue reflex genetic testing to a more comprehensive gene panel.   Evelyn Marshall  was offered a common hereditary cancer panel (47 genes) and an expanded pan-cancer panel (77 genes). Evelyn Marshall was informed of the benefits and limitations of each panel, including that expanded pan-cancer panels contain genes that do not have clear management guidelines at this point in time.  We also discussed that as the number of genes included on a panel increases, the  chances of variants of uncertain significance increases.  After considering the benefits and limitations of each gene panel, Evelyn Marshall  elected to have a common hereditary cancers panel through [facility].  The CustomNext-Cancer+RNAinsight panel offered by [facility] includes sequencing and rearrangement analysis for the following 47 genes:  APC, ATM, AXIN2, BARD1, BMPR1A, BRCA1, BRCA2, BRIP1, CDH1, CDK4, CDKN2A, CHEK2, DICER1, EPCAM, GREM1, HOXB13, MEN1, MLH1, MSH2, MSH3, MSH6, MUTYH, NBN, NF1, NF2, NTHL1, PALB2, PMS2, POLD1, POLE, PTEN, RAD51C, RAD51D, RECQL, RET, SDHA, SDHAF2, SDHB, SDHC, SDHD, SMAD4, SMARCA4, STK11, TP53, TSC1, TSC2, and VHL.  RNA data is routinely analyzed for use in variant interpretation for all genes.  Based on Evelyn Marshall's personal history of breast cancer, she meets medical criteria for genetic testing. Despite that she meets criteria, she may still have an out of pocket cost. We discussed that if her out of pocket cost for testing is over $100, the laboratory should contact them to discuss self-pay prices, patient pay assistance programs, if applicable, and other billing options.  PLAN: After considering the risks, benefits, and limitations, Evelyn Marshall provided informed consent to pursue genetic testing and the blood sample was sent to [facility] for analysis of the BRCAPlus and CustomNext-Cancer +RNAinsight. Results should be available within approximately 1-2 weeks' time, at which point they will be disclosed by telephone to Evelyn. Rosato, as will any additional recommendations warranted by these results. Evelyn. Dino will receive a summary of her genetic counseling visit and a copy of her results once available. This information will also be available in Epic.   Lastly, we encouraged Evelyn. Kaufhold to remain in contact with cancer genetics annually so that we can continuously update the family history and inform her of any changes in cancer genetics and testing that may be of benefit  for this family.   Evelyn. Ficken questions were answered to her satisfaction today. Our contact information was provided should additional questions or concerns arise. Thank you for the referral and allowing [place] to share in the care of your patient.   Julizza Sassone M. Joette Catching, [place], [name] Genetic Counselor [email or web address] (P) [phone]  The patient was seen for a total of 40 minutes in face-to-face genetic counseling.  The patient was accompanied by her husband, Marya Amsler. Drs. Magrinat, Lindi Adie and/or Burr Medico were available to discuss this case as needed.  _______________________________________________________________________ For Office Staff:  Number of people involved in session: 2 Was an Intern/ student involved with case: no

## 2020-12-15 NOTE — Progress Notes (Signed)
START OFF PATHWAY REGIMEN - Breast   [id number]:AC (Doxorubicin IV + Cyclophosphamide IV) q21 Days:   A cycle is every 21 days:     Doxorubicin      Cyclophosphamide   **Always confirm dose/schedule in your pharmacy ordering system**  Patient Characteristics: Preoperative or Nonsurgical Candidate (Clinical Staging), Neoadjuvant Therapy followed by Surgery, Invasive Disease, Chemotherapy, HER2 Positive, ER Positive Therapeutic Status: Preoperative or Nonsurgical Candidate (Clinical Staging) AJCC M Category: cM0 AJCC Grade: G2 Breast Surgical Plan: Neoadjuvant Therapy followed by Surgery ER Status: Positive (+) AJCC 8 Stage Grouping: IIIA HER2 Status: Positive (+) AJCC T Category: cT3 AJCC N Category: cN2 PR Status: Negative (-) Intent of Therapy: Curative Intent, Discussed with Patient

## 2020-12-17 ENCOUNTER — Encounter: Payer: Self-pay | Admitting: *Deleted

## 2020-12-17 ENCOUNTER — Telehealth: Payer: Self-pay | Admitting: *Deleted

## 2020-12-17 NOTE — Telephone Encounter (Signed)
Spoke with patient to follow up from new patient appt and assess navigation needs. Informed her we r trying to get her port placed by IR.  Her echo and chemo class are 12/9 and we r trying to get that moved up. Informed I would call her with appts when I get them. Patient verbalized understanding. Contact information given and encouraged her to call should anything arise.

## 2020-12-20 ENCOUNTER — Encounter: Payer: Self-pay | Admitting: *Deleted

## 2020-12-20 ENCOUNTER — Telehealth: Payer: Self-pay | Admitting: *Deleted

## 2020-12-20 NOTE — Telephone Encounter (Signed)
Spoke with patient to give her appts.  Confirmed appt for echo 12/1 at 10am, chemo education 12/1 at [facility], [facility] 12/6 at 930.  Gave arrival time of 730 and instructions regarding port.  Chemo will start on 12/7. Patient states she has mychart and will look for updated times regarding chemo.  Encouraged her to call should she have any questions or concerns.

## 2020-12-20 NOTE — Telephone Encounter (Signed)
Received call from [facility] IR stating that they could place her port this week. R/S port for 12/2 and then patient will receive 1st chemo after port placement.  Confirmed all appts with patient.

## 2020-12-21 ENCOUNTER — Other Ambulatory Visit (HOSPITAL_COMMUNITY): Payer: BC Managed Care – PPO

## 2020-12-21 ENCOUNTER — Ambulatory Visit: Payer: BC Managed Care – PPO

## 2020-12-21 ENCOUNTER — Other Ambulatory Visit: Payer: Self-pay | Admitting: *Deleted

## 2020-12-21 DIAGNOSIS — C50411 Malignant neoplasm of upper-outer quadrant of right female breast: Secondary | ICD-10-CM

## 2020-12-21 DIAGNOSIS — Z170 Estrogen receptor positive status [ER+]: Secondary | ICD-10-CM

## 2020-12-22 ENCOUNTER — Encounter: Payer: Self-pay | Admitting: Hematology and Oncology

## 2020-12-22 ENCOUNTER — Encounter: Payer: Self-pay | Admitting: *Deleted

## 2020-12-22 ENCOUNTER — Other Ambulatory Visit: Payer: Self-pay

## 2020-12-22 ENCOUNTER — Other Ambulatory Visit: Payer: Self-pay | Admitting: *Deleted

## 2020-12-22 ENCOUNTER — Ambulatory Visit: Payer: BC Managed Care – PPO | Attending: Hematology and Oncology

## 2020-12-22 DIAGNOSIS — C50411 Malignant neoplasm of upper-outer quadrant of right female breast: Secondary | ICD-10-CM

## 2020-12-23 ENCOUNTER — Encounter: Payer: Self-pay | Admitting: Hematology and Oncology

## 2020-12-23 ENCOUNTER — Inpatient Hospital Stay: Payer: BC Managed Care – PPO

## 2020-12-23 ENCOUNTER — Other Ambulatory Visit: Payer: Self-pay | Admitting: Radiology

## 2020-12-23 ENCOUNTER — Inpatient Hospital Stay: Payer: BC Managed Care – PPO | Attending: Hematology and Oncology

## 2020-12-23 ENCOUNTER — Other Ambulatory Visit: Payer: Self-pay | Admitting: Hematology and Oncology

## 2020-12-23 ENCOUNTER — Ambulatory Visit: Payer: BC Managed Care – PPO | Attending: Hematology and Oncology

## 2020-12-23 ENCOUNTER — Ambulatory Visit (HOSPITAL_COMMUNITY)
Admission: RE | Admit: 2020-12-23 | Discharge: 2020-12-23 | Disposition: A | Discharge status: Home / Self Care | Payer: BC Managed Care – PPO | Source: Ambulatory Visit | Attending: Hematology and Oncology | Admitting: Hematology and Oncology

## 2020-12-23 ENCOUNTER — Other Ambulatory Visit: Payer: Self-pay

## 2020-12-23 DIAGNOSIS — C50411 Malignant neoplasm of upper-outer quadrant of right female breast: Secondary | ICD-10-CM | POA: Diagnosis present

## 2020-12-23 DIAGNOSIS — Z0189 Encounter for other specified special examinations: Secondary | ICD-10-CM

## 2020-12-23 DIAGNOSIS — D509 Iron deficiency anemia, unspecified: Secondary | ICD-10-CM | POA: Diagnosis not present

## 2020-12-23 DIAGNOSIS — Z170 Estrogen receptor positive status [ER+]: Secondary | ICD-10-CM | POA: Diagnosis present

## 2020-12-23 DIAGNOSIS — Z5111 Encounter for antineoplastic chemotherapy: Secondary | ICD-10-CM | POA: Diagnosis present

## 2020-12-23 DIAGNOSIS — Z3A21 21 weeks gestation of pregnancy: Secondary | ICD-10-CM | POA: Diagnosis not present

## 2020-12-23 DIAGNOSIS — O9A112 Malignant neoplasm complicating pregnancy, second trimester: Secondary | ICD-10-CM | POA: Diagnosis not present

## 2020-12-23 DIAGNOSIS — Z452 Encounter for adjustment and management of vascular access device: Secondary | ICD-10-CM | POA: Insufficient documentation

## 2020-12-23 DIAGNOSIS — O99012 Anemia complicating pregnancy, second trimester: Secondary | ICD-10-CM | POA: Insufficient documentation

## 2020-12-23 DIAGNOSIS — Z3A25 25 weeks gestation of pregnancy: Secondary | ICD-10-CM | POA: Insufficient documentation

## 2020-12-23 LAB — CMP (CANCER CENTER ONLY)
ALT: 24 U/L (ref 0–44)
AST: 19 U/L (ref 15–41)
Albumin: 2.9 g/dL — ABNORMAL LOW (ref 3.5–5.0)
Alkaline Phosphatase: 157 U/L — ABNORMAL HIGH (ref 38–126)
Anion gap: 10 (ref 5–15)
BUN: 6 mg/dL (ref 6–20)
CO2: 21 mmol/L — ABNORMAL LOW (ref 22–32)
Calcium: 8.9 mg/dL (ref 8.9–10.3)
Chloride: 104 mmol/L (ref 98–111)
Creatinine: 0.69 mg/dL (ref 0.44–1.00)
GFR, Estimated: >60 mL/min (ref >60)
Glucose, Bld: 112 mg/dL — ABNORMAL HIGH (ref 70–99)
Potassium: 3.6 mmol/L (ref 3.5–5.1)
Sodium: 135 mmol/L (ref 135–145)
Total Bilirubin: 0.3 mg/dL (ref 0.3–1.2)
Total Protein: 7.4 g/dL (ref 6.5–8.1)

## 2020-12-23 LAB — CBC WITH DIFFERENTIAL (CANCER CENTER ONLY)
Abs Immature Granulocytes: 0.04 10*3/uL (ref 0.00–0.07)
Basophils Absolute: 0 10*3/uL (ref 0.0–0.1)
Basophils Relative: 0 %
Eosinophils Absolute: 0.1 10*3/uL (ref 0.0–0.5)
Eosinophils Relative: 1 %
HCT: 30.7 % — ABNORMAL LOW (ref 36.0–46.0)
Hemoglobin: 10.1 g/dL — ABNORMAL LOW (ref 12.0–15.0)
Immature Granulocytes: 1 %
Lymphocytes Relative: 19 %
Lymphs Abs: 1.3 10*3/uL (ref 0.7–4.0)
MCH: 24.5 pg — ABNORMAL LOW (ref 26.0–34.0)
MCHC: 32.9 g/dL (ref 30.0–36.0)
MCV: 74.3 fL — ABNORMAL LOW (ref 80.0–100.0)
Monocytes Absolute: 0.4 10*3/uL (ref 0.1–1.0)
Monocytes Relative: 6 %
Neutro Abs: 5.1 10*3/uL (ref 1.7–7.7)
Neutrophils Relative %: 73 %
Platelet Count: 374 10*3/uL (ref 150–400)
RBC: 4.13 MIL/uL (ref 3.87–5.11)
RDW: 15.7 % — ABNORMAL HIGH (ref 11.5–15.5)
WBC Count: 7 10*3/uL (ref 4.0–10.5)
nRBC: 0 % (ref 0.0–0.2)

## 2020-12-23 LAB — ECHOCARDIOGRAM COMPLETE
Area-P 1/2: 3.5 cm2
S' Lateral: 2.1 cm

## 2020-12-23 MED ORDER — LIDOCAINE-PRILOCAINE 2.5-2.5 % EX CREA: TOPICAL_CREAM | CUTANEOUS | 3 refills | Status: DC

## 2020-12-23 MED ORDER — DEXAMETHASONE 4 MG PO TABS: 4.0000 mg | ORAL_TABLET | Freq: Every day | ORAL | 1 refills | Status: DC

## 2020-12-23 MED ORDER — ONDANSETRON HCL 8 MG PO TABS: 8.0000 mg | ORAL_TABLET | Freq: Two times a day (BID) | ORAL | 1 refills | Status: DC | PRN

## 2020-12-23 NOTE — Therapy (Signed)
[facility] @ [place], [place], [address] Phone: [phone]   Fax:  [phone]  Physical Therapy Evaluation  Patient Details  Name: Evelyn Marshall MRN: [id number] Date of Birth: 1985/01/30 Referring Provider (PT): Dr. Lindi Adie   Encounter Date: 12/23/2020   PT End of Session - 12/23/20 1720     Visit Number 1    Number of Visits 2    Date for PT Re-Evaluation 06/09/21    PT Start Time 1603    PT Stop Time 1655    PT Time Calculation (min) 52 min    Activity Tolerance Patient tolerated treatment well    Behavior During Therapy [facility] for tasks assessed/performed             Past Medical History:  Diagnosis Date   Abnormal Pap smear 2012   colpo result benign   Chlamydia    Family history of breast cancer 12/15/2020   History of pre-eclampsia    HPV (human papilloma virus) infection    Hyperemesis    Hypokalemia    Pregnancy induced hypertension    Ptyalism    Vaginal Pap smear, abnormal     Past Surgical History:  Procedure Laterality Date   COLPOSCOPY     WISDOM TOOTH EXTRACTION      There were no vitals filed for this visit.    Subjective Assessment - 12/23/20 1611     Subjective Pt is [redacted] weeks pregnant. She  was diagnosed with a full body MRI done at [facility] as part of a research study on November 4 due to a high risk pregnancy.  She was then referred by her MD for mammograms and was diagnosed. She will start chemo tomorrow for 4 rounds, She is due on March 30( 37 weeks) and following delivery she will resume chemo afterwards.  Surgery with Dr. Donne Hazel to be planned for around the summer in May, and radiation to follow. She was scheduled to return for her SOZO screen 1 month after she delivers her baby    Pertinent History She is [redacted] weeks pregnant. She had an abnormal noninvasive pregnancy testing due to her high risk pregnancy. This also coincided with the presence of a right breast mass. She is  on a study at the [facility] that she was being evaluated for all of this called the identify study.  She underwent a full body MRI. This showed several right axillary and subpectoral nodes the largest measuring 3.7 cm as well as multiple right breast nodules. She also had a prominent 1.4 cm left supraclavicular node. She was then evaluated here with mammogram and ultrasound. Mammogram shows several small masses measuring between 6 to 12 mm. There is also 10 x 10 x 10 cm of calcifications. She has at least 5 abnormal lymph nodes. Biopsy of one of the lymph nodes is positive. 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  She is due to have a port Friday and start chemotherapy the same day. She is also due to have additional biopsies of the left supra clavicular node as well as the calcifications tomorrow. She does have a family history of maternal cousin at age 27 is deceased from breast cancer and has genetic testing pending currently. Plan is neoadjuvant chemo, deliver baby, continue chemo. Surgery around May, radiation to follow and antiestrogens. Pt is a Special needs teacher at [employer]  Patient Stated Goals Pt is being seen for preop assessment    Currently in Pain? No/denies    Multiple Pain Sites No                [facility] PT Assessment - 12/23/20 0001       Assessment   Medical Diagnosis Right breast CA    Referring Provider (PT) Dr. Lindi Adie    Onset Date/Surgical Date --   not scheduled yet.  Probably May or after   Hand Dominance Right    Prior Therapy no      Precautions   Precaution Comments Active CA      Restrictions   Weight Bearing Restrictions No      Balance Screen   Has the patient fallen in the past 6 months No    Has the patient had a decrease in activity level because of a fear of falling?  No    Is the patient reluctant to leave their home because of a fear of falling?  No      Home  Environment   Living Environment Private residence    Living Arrangements Spouse/significant other;Children   56yrold and 565year old boys.  568yr. old with autism   Available Help at Discharge Family      Prior Function   Level of Independence Independent    Vocation Full time employment    Vocation Requirements Special Needs Teacher [employer]    Leisure nothing special      Cognition   Overall Cognitive Status Within Functional Limits for tasks assessed      Posture/Postural Control   Posture/Postural Control Postural limitations    Postural Limitations Rounded Shoulders;Forward head      AROM   Right Shoulder Extension 55 Degrees    Right Shoulder Flexion 163 Degrees    Right Shoulder ABduction 175 Degrees    Right Shoulder Internal Rotation 62 Degrees    Right Shoulder External Rotation 100 Degrees    Left Shoulder Extension 55 Degrees    Left Shoulder Flexion 158 Degrees    Left Shoulder ABduction 162 Degrees    Left Shoulder Internal Rotation 68100 Degrees               LYMPHEDEMA/ONCOLOGY QUESTIONNAIRE - 12/23/20 0001       Type   Cancer Type Right IDC      Treatment   Active Chemotherapy Treatment Yes   starting tomorrow   Past Chemotherapy Treatment No    Active Radiation Treatment No    Past Radiation Treatment No    Current Hormone Treatment No    Past Hormone Therapy No      Right Upper Extremity Lymphedema   10 cm Proximal to Olecranon Process 40.3 cm    Olecranon Process 28.5 cm    10 cm Proximal to Ulnar Styloid Process 26 cm    Just Proximal to Ulnar Styloid Process 17.8 cm    At Base of 2nd Digit 6.4 cm      Left Upper Extremity Lymphedema   10 cm Proximal to Olecranon Process 38.4 cm    Olecranon Process 29 cm    10 cm Proximal to Ulnar Styloid Process 24.5 cm    Just Proximal to Ulnar Styloid Process 17.3 cm    At Base of 2nd Digit 6.4 cm                   Quick Dash - 12/23/20 0001  Open a tight or new jar No  difficulty    Do heavy household chores (wash walls, wash floors) No difficulty    Carry a shopping bag or briefcase No difficulty    Wash your back No difficulty    Use a knife to cut food No difficulty    Recreational activities in which you take some force or impact through your arm, shoulder, or hand (golf, hammering, tennis) No difficulty    During the past week, to what extent has your arm, shoulder or hand problem interfered with your normal social activities with family, friends, neighbors, or groups? Not at all    During the past week, to what extent has your arm, shoulder or hand problem limited your work or other regular daily activities Not at all    Arm, shoulder, or hand pain. None    Tingling (pins and needles) in your arm, shoulder, or hand Mild    Difficulty Sleeping No difficulty    DASH Score 2.27 %              Objective measurements completed on examination: See above findings.                PT Education - 12/23/20 1718     Education Details 4 post op exercises, lymphedema, ABC class,SOZO, impt of walking for exercise    Person(s) Educated Patient    Methods Explanation;Handout    Comprehension Verbalized understanding                 PT Long Term Goals - 12/23/20 1733       PT LONG TERM GOAL #1   Title Pts shoulder ROM will be within 5-8 degrees of baseline for improved ability to perform home activities    Time 6    Period Months    Status New    Target Date 06/09/21             Breast Clinic Goals - 12/23/20 1732       Patient will be able to verbalize understanding of pertinent lymphedema risk reduction practices relevant to her diagnosis specifically related to skin care.   Time 1    Period Days    Status Achieved    Target Date 12/23/20      Patient will be able to return demonstrate and/or verbalize understanding of the post-op home exercise program related to regaining shoulder range of motion.   Time 1     Period Days    Status Achieved    Target Date 12/23/20      Patient will be able to verbalize understanding of the importance of attending the postoperative After Breast Cancer Class for further lymphedema risk reduction education and therapeutic exercise.   Time 1    Period Weeks    Status Achieved    Target Date 12/23/20                   Plan - 12/23/20 1724     Clinical Impression Statement Pt is [redacted] weeks pregnant and due to high risk pregnancy had testing done at [facility] that detected abnormalities in her full body scan.  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 She is having neoadjuvant chemo, will deliver baby around March 30, and resume chemo prior to surgery.  Surgery likely to be sometime  in May.  She will come back for her SOZO scheduled presently for May 8. BAseline measurements were taken for shoulder ROM and circumferential arm measurements.  Educated pt about SOZO and lymphedema, and 4 post op exercises. Pt was given my card to call if she has any questions    Personal Factors and Comorbidities Comorbidity 1    Comorbidities Active Cancer pending sx    Stability/Clinical Decision Making Stable/Uncomplicated    Clinical Decision Making Low    Rehab Potential Excellent    PT Frequency 1x / week    PT Duration Other (comment)   6 months for followup   PT Treatment/Interventions ADLs/Self Care Home Management;Therapeutic exercise;Patient/family education    PT Next Visit Plan Pending SOZO May 8 approx 1 month after delivery of baby, post op reassessment after surgery    PT Home Exercise Plan 4 post op exercises as allowed by MD    Recommended Other Services ABC class, SOZO every 3 months    Consulted and Agree with Plan of Care Patient           Patient will follow up at outpatient cancer rehab 3-4 weeks following  surgery.  If the patient requires physical therapy at that time, a specific plan will be dictated and sent to the referring physician for approval. The patient was educated today on appropriate basic range of motion exercises to begin post operatively and the importance of attending the After Breast Cancer class following surgery.  Patient was educated today on lymphedema risk reduction practices as it pertains to recommendations that will benefit the patient immediately following surgery.  She verbalized good understanding.     Patient will benefit from skilled therapeutic intervention in order to improve the following deficits and impairments:  Decreased knowledge of precautions, Postural dysfunction  Visit Diagnosis: Malignant neoplasm of upper-outer quadrant of right breast in female, estrogen receptor positive [facility])     Problem List Patient Active Problem List   Diagnosis Date Noted   Malignant neoplasm of upper-outer quadrant of right breast in female, estrogen receptor positive ([place]) 12/15/2020   Family history of breast cancer 12/15/2020   Severe preeclampsia, third trimester 02/25/2019   Hyperemesis gravidarum 08/20/2018   Preeclampsia, third trimester 11/15/2015   Pre-eclampsia during pregnancy in third trimester, antepartum 11/15/2015   Preeclampsia 11/15/2015   Mild preeclampsia 11/12/2015   Hypokalemia, inadequate intake 06/17/2015   Ptyalism 05/18/2015   Chronic hypertension in pregnancy 05/18/2015   Morbid obesity with BMI of 40.0-44.9, adult ([facility]) 05/18/2015   Rh negative, maternal 05/18/2015    Claris Pong, PT 12/23/2020, 5:37 PM  [facility] @ [address] [place], [place], [address] Phone: [phone]   Fax:  [phone]  Name: Evelyn Marshall MRN: [id number] Date of Birth: 1985-09-01

## 2020-12-23 NOTE — Patient Instructions (Signed)
Physical Therapy Information for After Breast Cancer Surgery/Treatment:  Lymphedema is a swelling condition that you may be at risk for in your arm if you have lymph nodes removed from the armpit area.  After a sentinel node biopsy, the risk is approximately 5-9% and is higher after an axillary node dissection.  There is treatment available for this condition and it is not life-threatening.  Contact your physician or physical therapist with concerns. You may begin the 4 shoulder/posture exercises (see additional sheet) when permitted by your physician (typically a week after surgery).  If you have drains, you may need to wait until those are removed before beginning range of motion exercises.  A general recommendation is to not lift your arms above shoulder height until drains are removed.  These exercises should be done to your tolerance and gently.  This is not a "no pain/no gain" type of recovery so listen to your body and stretch into the range of motion that you can tolerate, stopping if you have pain.  If you are having immediate reconstruction, ask your plastic surgeon about doing exercises as he or she may want you to wait. We encourage you to attend the free one time ABC (After Breast Cancer) class offered by [facility].  You will learn information related to lymphedema risk, prevention and treatment and additional exercises to regain mobility following surgery.  You can call [phone] for more information.  This is offered the 1st and 3rd Monday of each month.  You only attend the class one time. While undergoing any medical procedure or treatment, try to avoid blood pressure being taken or needle sticks from occurring on the arm on the side of cancer.   This recommendation begins after surgery and continues for the rest of your life.  This may help reduce your risk of getting lymphedema (swelling in your arm). An excellent resource for those seeking information on  lymphedema is the National Lymphedema Network's web site. It can be accessed at [place].org If you notice swelling in your hand, arm or breast at any time following surgery (even if it is many years from now), please contact your doctor or physical therapist to discuss this.  Lymphedema can be treated at any time but it is easier for you if it is treated early on.  If you feel like your shoulder motion is not returning to normal in a reasonable amount of time, please contact your surgeon or physical therapist.   ABC CLASS After Breast Cancer Class  After Breast Cancer Class is a specially designed exercise class to assist you in a safe recover after having breast cancer surgery.  In this class you will learn how to get back to full function whether your drains were just removed or if you had surgery a month ago.  This one-time class is held the 1st and 3rd Monday of every month from 11:00 a.m. until 12:00 noon and is a virtual class  This class is FREE and space is limited. For more information or to register for the next available class, call [phone].  Class Goals  Understand specific stretches to improve the flexibility of you chest and shoulder. Learn ways to safely strengthen your upper body and improve your posture. Understand the warning signs of infection and why you may be at risk for an arm infection. Learn about Lymphedema and prevention.  ** You do not attend this class until after surgery.  Drains must be removed to participate  Patient was instructed today in a home exercise program today for post op shoulder range of motion. These included active assist shoulder flexion in sitting, scapular retraction, wall walking with shoulder abduction, and hands behind head external rotation.  She was encouraged to do these twice a day, holding 3 seconds and repeating 5 times when permitted by her physician.

## 2020-12-23 NOTE — Progress Notes (Signed)
Met with patient at registration to introduce myself as Financial Resource Specialist and to offer available resources.  Discussed one-time $1000 [employer] grant and qualifications to assist with personal expenses while going through treatment.  Gave her my card if interested in applying and for any additional financial questions or concerns.  

## 2020-12-23 NOTE — Progress Notes (Signed)
  Echocardiogram 2D Echocardiogram has been performed.  Evelyn Marshall 12/23/2020, 11:15 AM

## 2020-12-24 ENCOUNTER — Other Ambulatory Visit: Payer: Self-pay | Admitting: Hematology and Oncology

## 2020-12-24 ENCOUNTER — Ambulatory Visit (HOSPITAL_BASED_OUTPATIENT_CLINIC_OR_DEPARTMENT_OTHER): Payer: BC Managed Care – PPO

## 2020-12-24 ENCOUNTER — Other Ambulatory Visit (HOSPITAL_COMMUNITY): Payer: Self-pay | Admitting: Radiology

## 2020-12-24 ENCOUNTER — Encounter: Payer: Self-pay | Admitting: *Deleted

## 2020-12-24 ENCOUNTER — Ambulatory Visit (HOSPITAL_COMMUNITY)
Admission: RE | Admit: 2020-12-24 | Discharge: 2020-12-24 | Disposition: A | Discharge status: Home / Self Care | Payer: BC Managed Care – PPO | Source: Ambulatory Visit | Attending: Hematology and Oncology | Admitting: Hematology and Oncology

## 2020-12-24 ENCOUNTER — Other Ambulatory Visit: Payer: Self-pay

## 2020-12-24 ENCOUNTER — Inpatient Hospital Stay: Payer: BC Managed Care – PPO

## 2020-12-24 VITALS — BP 132/88 | HR 98 | Temp 98.2°F | Resp 18

## 2020-12-24 DIAGNOSIS — O99212 Obesity complicating pregnancy, second trimester: Secondary | ICD-10-CM | POA: Diagnosis not present

## 2020-12-24 DIAGNOSIS — Z362 Encounter for other antenatal screening follow-up: Secondary | ICD-10-CM

## 2020-12-24 DIAGNOSIS — O9A112 Malignant neoplasm complicating pregnancy, second trimester: Secondary | ICD-10-CM | POA: Diagnosis not present

## 2020-12-24 DIAGNOSIS — C50919 Malignant neoplasm of unspecified site of unspecified female breast: Secondary | ICD-10-CM

## 2020-12-24 DIAGNOSIS — Z3A23 23 weeks gestation of pregnancy: Secondary | ICD-10-CM

## 2020-12-24 DIAGNOSIS — E669 Obesity, unspecified: Secondary | ICD-10-CM | POA: Diagnosis not present

## 2020-12-24 DIAGNOSIS — O10012 Pre-existing essential hypertension complicating pregnancy, second trimester: Secondary | ICD-10-CM

## 2020-12-24 DIAGNOSIS — C50411 Malignant neoplasm of upper-outer quadrant of right female breast: Secondary | ICD-10-CM

## 2020-12-24 DIAGNOSIS — Z170 Estrogen receptor positive status [ER+]: Secondary | ICD-10-CM

## 2020-12-24 DIAGNOSIS — Z5111 Encounter for antineoplastic chemotherapy: Secondary | ICD-10-CM | POA: Diagnosis not present

## 2020-12-24 HISTORY — PX: IR IMAGING GUIDED PORT INSERTION: IMG5740

## 2020-12-24 IMAGING — US US MFM OB LIMITED
2 series · 15 of 28 positions shown · non-contrast
Comparison: none

[Series 1: us mfm ob limited · 9 of 26 slices shown (1 of 2)]
[im 1/26]
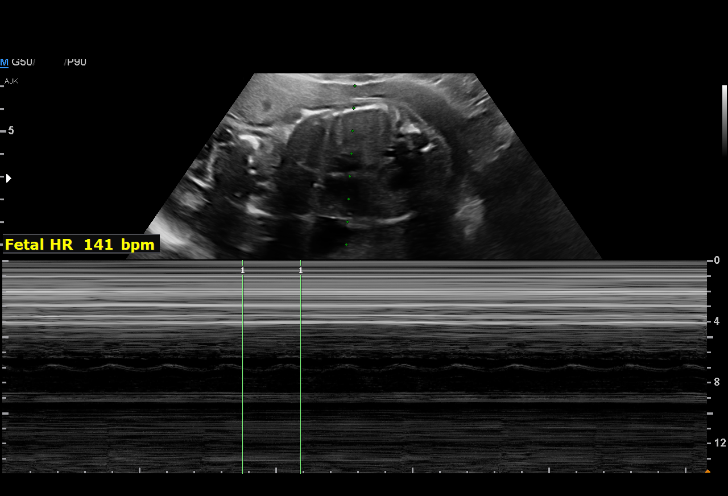
[im 4/26]
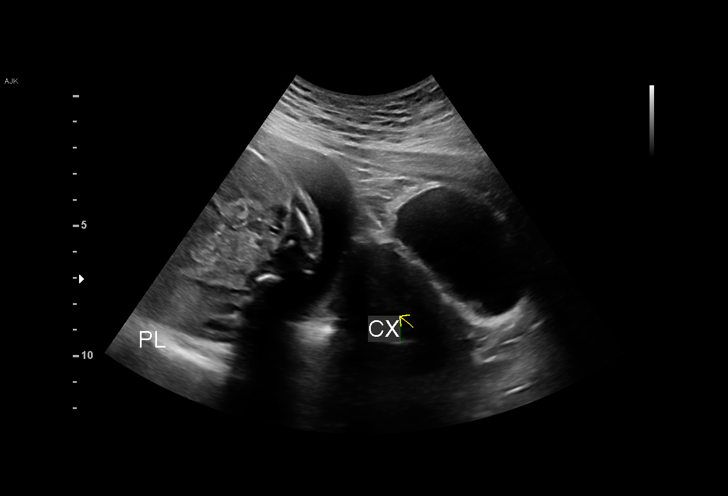
[im 7/26]
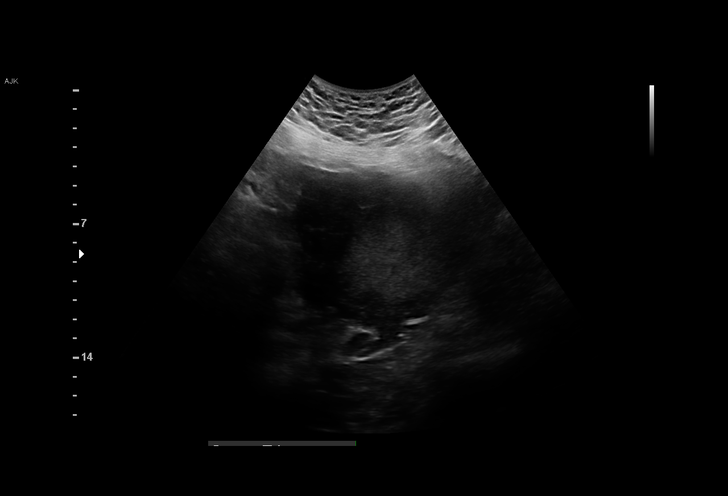
[im 11/26]
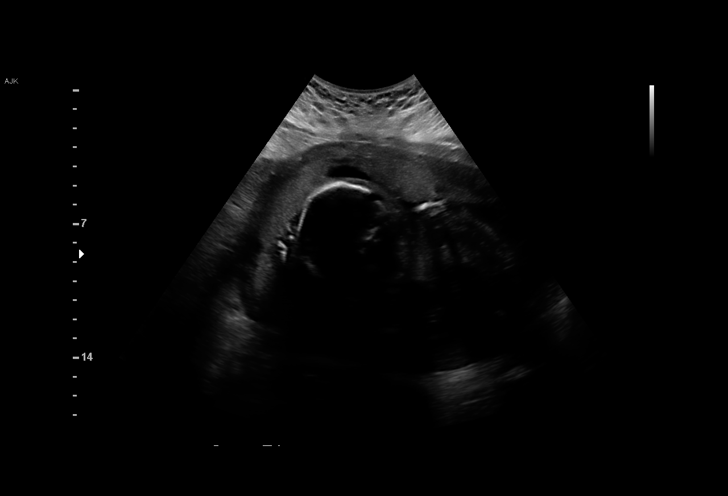
[im 14/26]
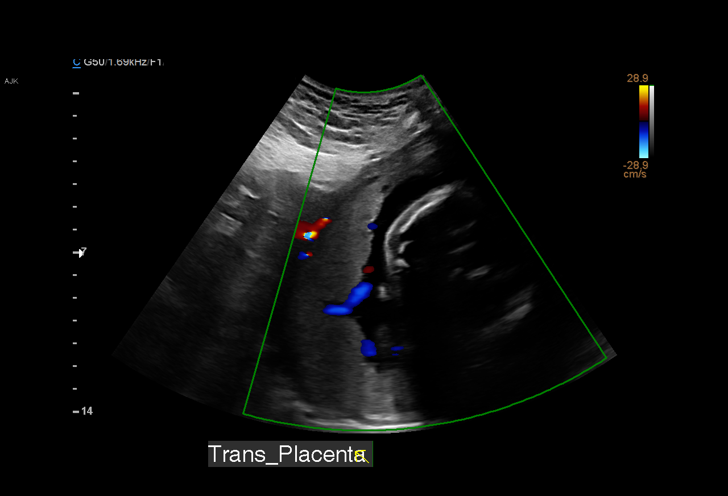
[im 17/26]
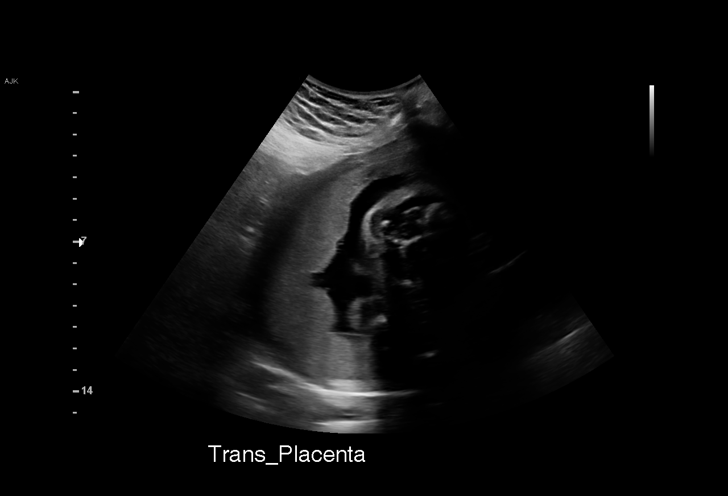
[im 21/26]
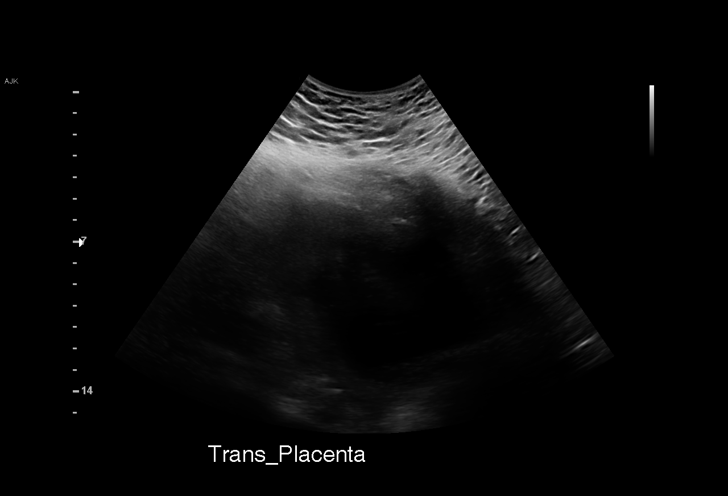
[im 24/26]
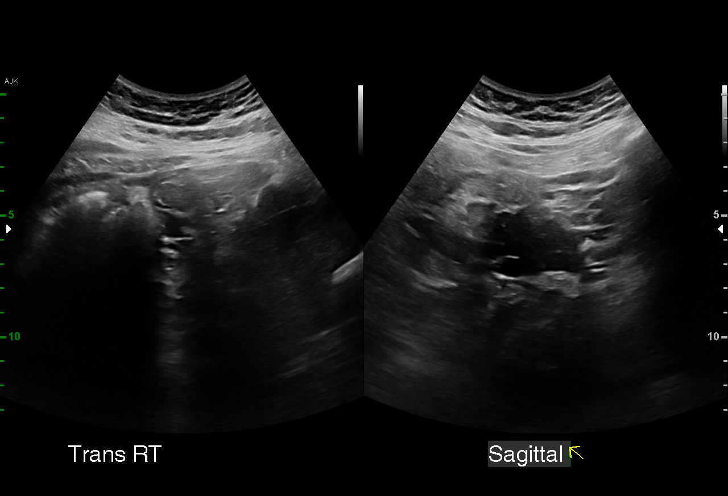
[im 26/26]
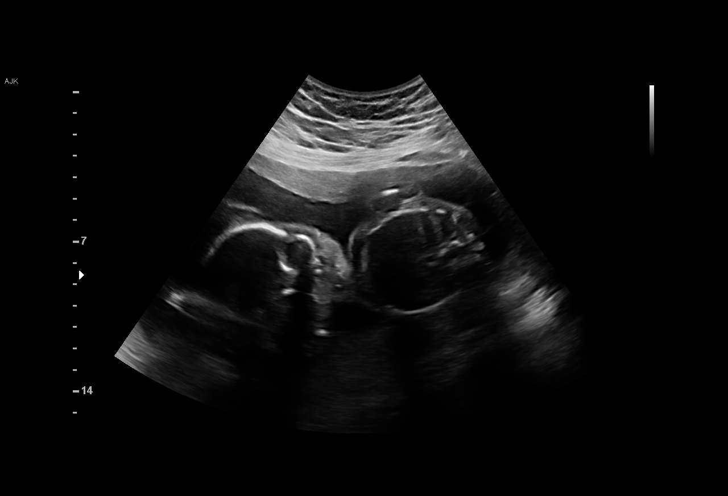

[Series 3: us mfm ob limited · 6 of 20 slices shown (2 of 2)]
[im 2/20]
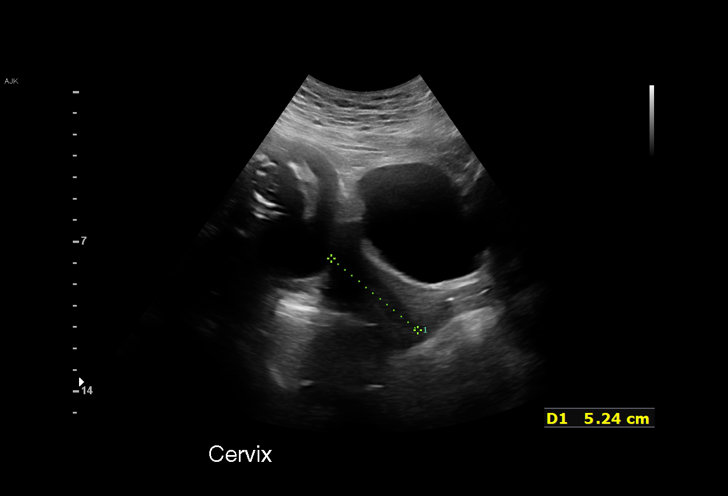
[im 6/20]
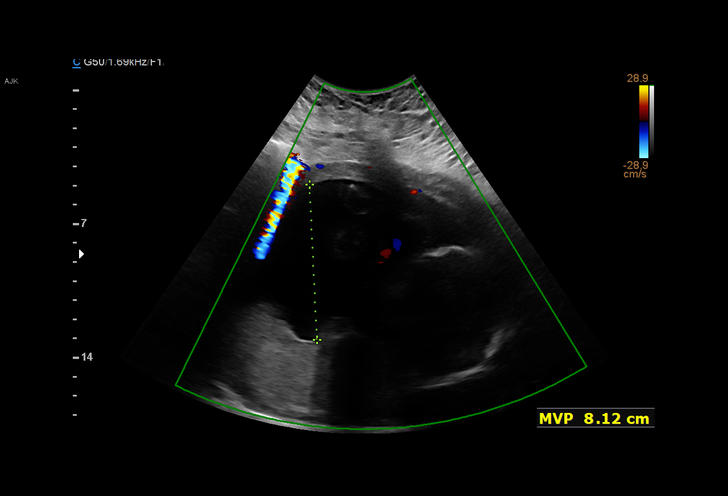
[im 9/20]
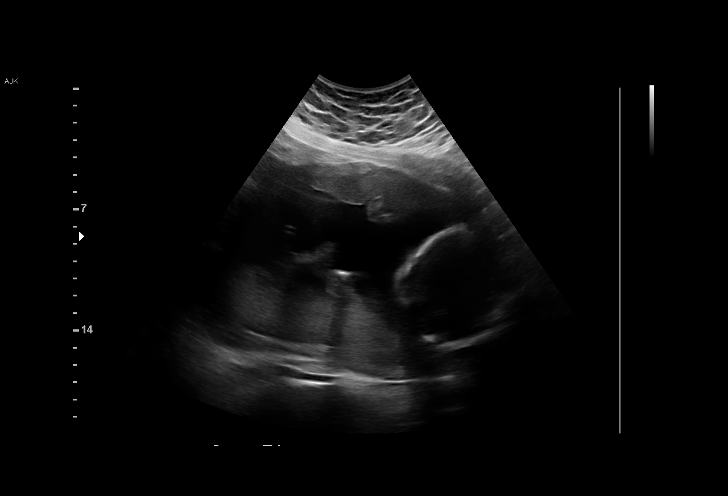
[im 13/20]
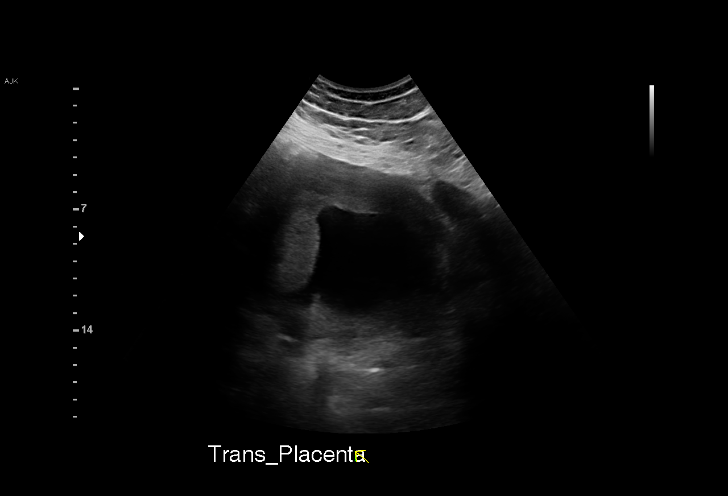
[im 16/20]
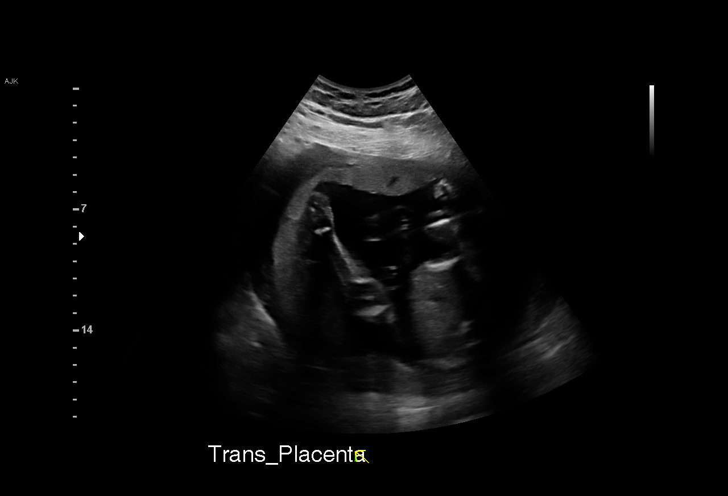
[im 20/20]
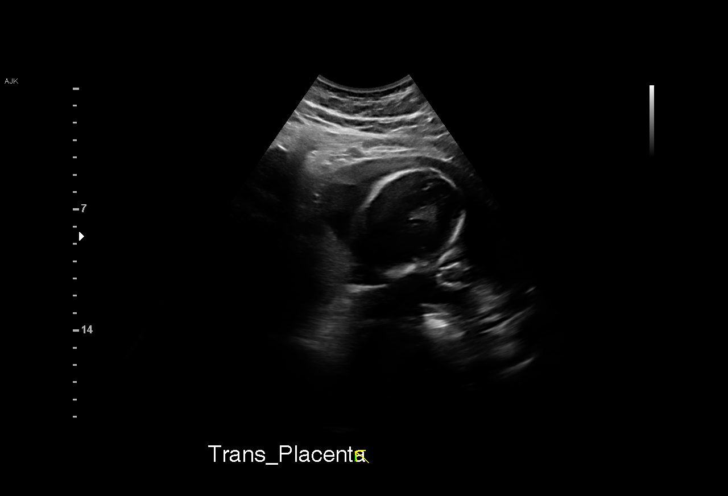

[15 of 28 positions shown; findings below may reference images not displayed]

OB/GYN

 1  US MFM OB LIMITED                     76815.01    [place]
                                                      L[place]

Indications

 Hypertension - Chronic/Pre-existing (dx        [B1]
 [DATE])
 Obesity complicating pregnancy, second         [B1]
 trimester (pregravid BMI 45)
 23 weeks gestation of pregnancy
 Rh negative state in antepartum (Anti-D - too  [B1]
 weak to titer)
 Unable to hear fetal heart tones as reason     O76
 for ultrasound
Fetal Evaluation

 Num Of Fetuses:         1
 Fetal Heart Rate(bpm):  141
 Cardiac Activity:       Observed
 Presentation:           Breech
 Placenta:               Posterior

 Amniotic Fluid
 AFI FV:      Within normal limits

                             Largest Pocket(cm)


 Comment:    Post procedure fetal heart rate: 152 bpm
OB History
 Gravidity:    5         Term:   3        Prem:   0        SAB:   0
 TOP:          1       Ectopic:  0        Living: 3
Gestational Age

 LMP:           23w 0d        Date:  [DATE]                 EDD:   [DATE]
 Best:          23w 0d     Det. By:  LMP  ([DATE])          EDD:   [DATE]
Cervix Uterus Adnexa

 Cervix
 Length:           5.66  cm.
 Normal appearance by transabdominal scan.

 Uterus
 No abnormality visualized.

 Right Ovary
 Not visualized.

 Left Ovary
 Within normal limits.

 Adnexa
 No abnormality visualized.
Comments

 This patient had a port placed for chemotherapy due to a
 diagnosis of breast cancer.

 A limited ultrasound performed today shows normal amniotic
 fluid.

 The fetus is in the breech presentation.

 The postprocedure fetal heart rate was 152 bpm.

## 2020-12-24 IMAGING — XA IR IMAGING GUIDED PORT INSERTION
2 series · 5 of 5 positions shown · non-contrast
Comparison: None.

INDICATION: 35-year-old with right breast cancer and pregnancy. Patient needs a
Port-A-Cath for chemotherapy.

EXAM:
FLUOROSCOPIC AND ULTRASOUND GUIDED PLACEMENT OF A SUBCUTANEOUS PORT

[Series 1: fl (-) angio · 1 of 1 slices shown]
[im 1/1]
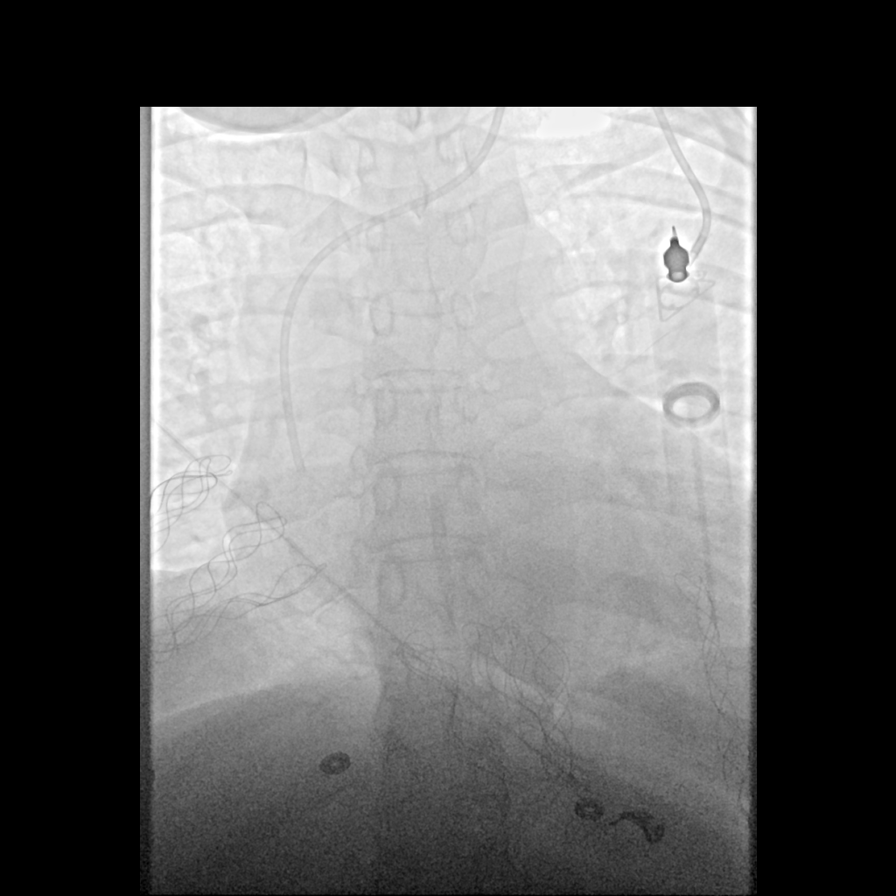

[Series 1: ir imaging guided port insertion · 4 of 4 slices shown]
[im 1/4]
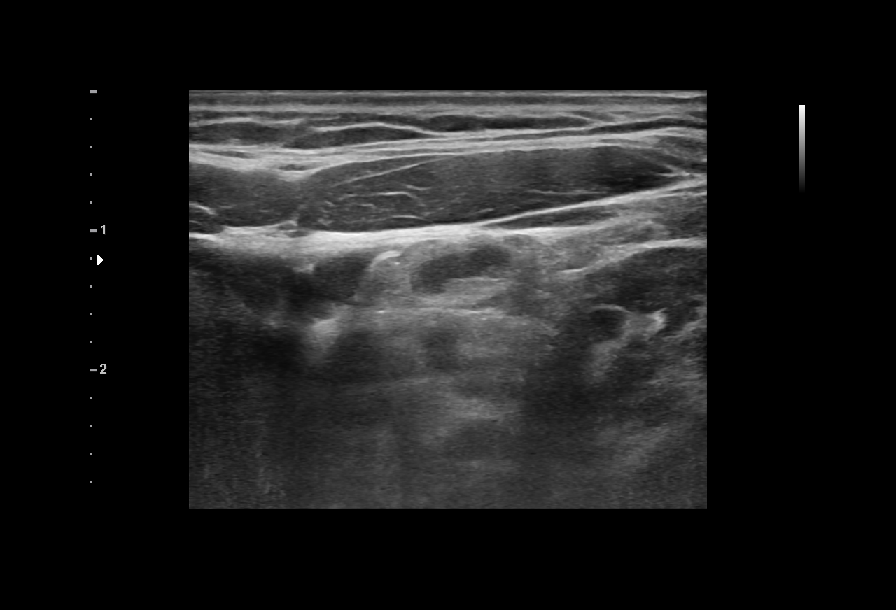
[im 2/4]
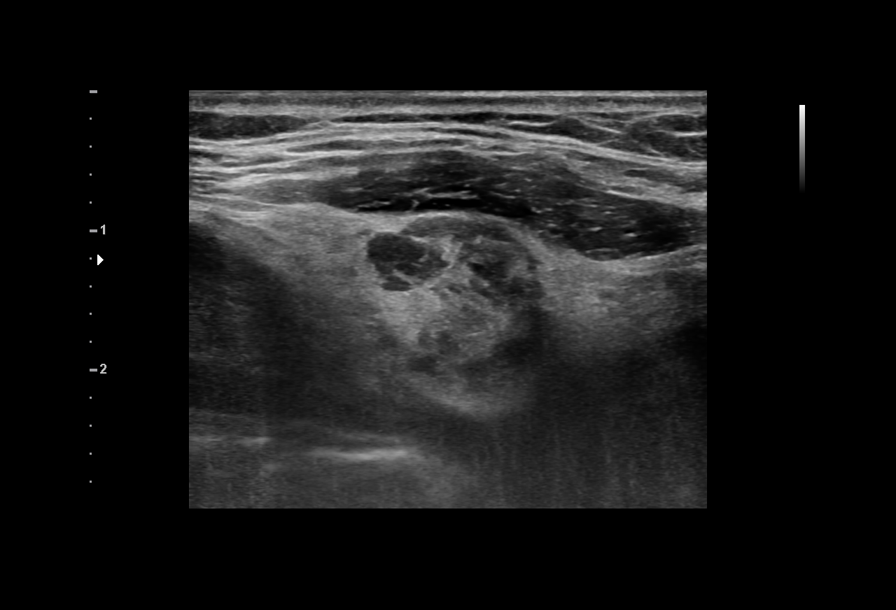
[im 3/4]
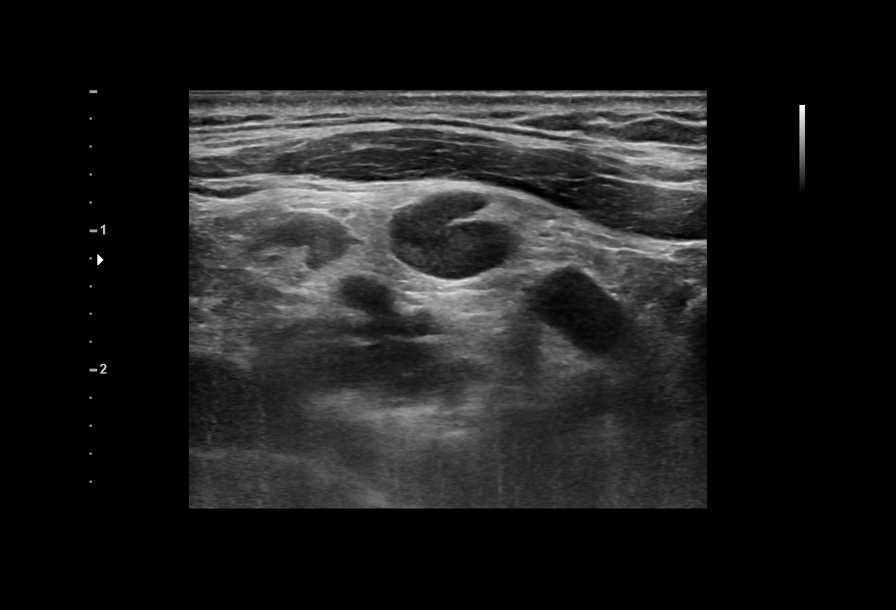
[im 4/4]
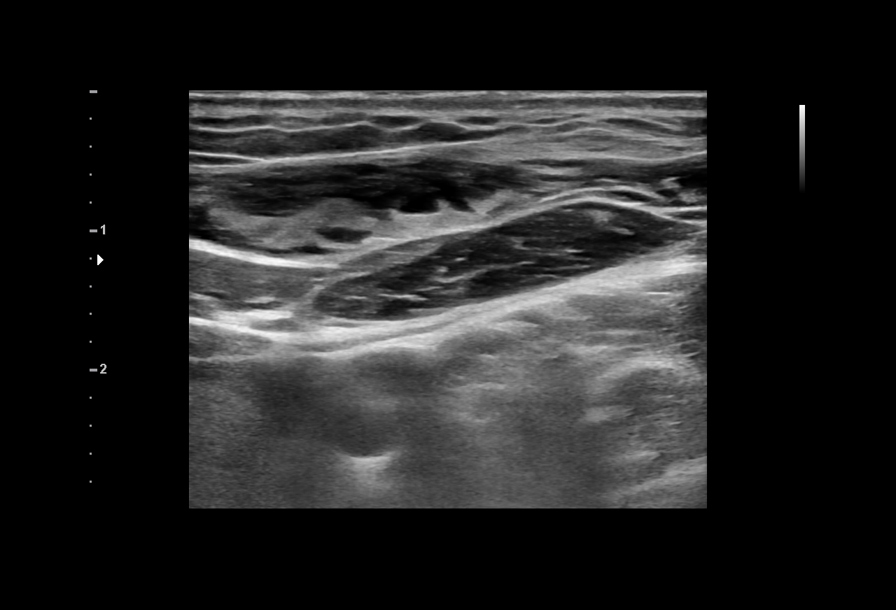

[5 of 5 positions shown; findings below may reference images not displayed]

MEDICATIONS:
Moderate sedation

ANESTHESIA/SEDATION:
Moderate (conscious) sedation was employed during this procedure. A
total of Versed 1.0mg and fentanyl 50 mcg was administered
intravenously at the order of the provider performing the procedure.

Total intra-service moderate sedation time: 39 minutes.

Patient's level of consciousness and vital signs were monitored
continuously by radiology nurse throughout the procedure under the
supervision of the provider performing the procedure.

FLUOROSCOPY TIME:  42 seconds, 9 mGy

COMPLICATIONS:
None immediate.

PROCEDURE:
The procedure, risks, benefits, and alternatives were explained to
the patient. Questions regarding the procedure were encouraged and
answered. The patient understands and consents to the procedure.

Patient was placed supine on the interventional table. The abdomen
was shielded. Ultrasound confirmed a patent left internal jugular
vein. Ultrasound image was saved for documentation. The left chest
and neck were cleaned with a skin antiseptic and a sterile drape was
placed. Maximal barrier sterile technique was utilized including
caps, mask, sterile gowns, sterile gloves, sterile drape, hand
hygiene and skin antiseptic. The left neck was anesthetized with 1%
lidocaine. Small incision was made in the left neck with a blade.
Micropuncture set was placed in the left internal jugular vein with
ultrasound guidance. The micropuncture wire was used for measurement
purposes. The left chest was anesthetized with 1% lidocaine with
epinephrine. #15 blade was used to make an incision and a
subcutaneous port pocket was formed. 8 french Power Port was
assembled. Subcutaneous tunnel was formed with a stiff tunneling
device. The port catheter was brought through the subcutaneous
tunnel. The port was placed in the subcutaneous pocket. The
micropuncture set was exchanged for a peel-away sheath. The catheter
was placed through the peel-away sheath and the tip was positioned
at the superior cavoatrial junction. Catheter placement was
confirmed with fluoroscopy. The port was accessed and flushed with
heparinized saline. Access needle was kept within the port. The port
pocket was closed using two layers of absorbable sutures and
Dermabond. The vein skin site was closed using a single layer of
absorbable suture and Dermabond. Sterile dressings were applied.
Patient tolerated the procedure well without an immediate
complication. Ultrasound and fluoroscopic images were taken and
saved for this procedure.
FINDINGS: Patent left internal jugular vein.

Outside MRI from [facility] raised concern for a suspicious left
supraclavicular lymph node. The left side of the neck was evaluated
with ultrasound. Multiple normal appearing lymph nodes were
identified on left side of the neck. However, there are slightly
enlarged right cervical lymph nodes. Lymph node biopsy was not
performed.
IMPRESSION: Placement of a subcutaneous power-injectable port device. Catheter
tip at the superior cavoatrial junction.

Multiple normal appearing lymph nodes on the left side of the neck.
A suspicious left supraclavicular lymph node was not confidently
identified and, therefore, left supraclavicular lymph node biopsy
was not performed. However, right cervical lymph nodes are more
prominent than the left side.

## 2020-12-24 MED ORDER — LIDOCAINE HCL 1 % IJ SOLN
INTRAMUSCULAR | Status: AC
  Filled 2020-12-24: qty 20

## 2020-12-24 MED ORDER — SODIUM CHLORIDE 0.9% FLUSH
10.0000 mL | INTRAVENOUS | Status: DC | PRN
  Administered 2020-12-24: 10 mL

## 2020-12-24 MED ORDER — SODIUM CHLORIDE 0.9 % IV SOLN
600.0000 mg/m2 | Freq: Once | INTRAVENOUS | Status: AC
  Administered 2020-12-24: 1440 mg via INTRAVENOUS
  Filled 2020-12-24: qty 72

## 2020-12-24 MED ORDER — HEPARIN SOD (PORK) LOCK FLUSH 100 UNIT/ML IV SOLN
500.0000 [IU] | Freq: Once | INTRAVENOUS | Status: AC | PRN
  Administered 2020-12-24: 500 [IU]

## 2020-12-24 MED ORDER — SODIUM CHLORIDE 0.9 % IV SOLN: Freq: Once | INTRAVENOUS | Status: AC

## 2020-12-24 MED ORDER — HEPARIN SOD (PORK) LOCK FLUSH 100 UNIT/ML IV SOLN
INTRAVENOUS | Status: AC
  Filled 2020-12-24: qty 5

## 2020-12-24 MED ORDER — SODIUM CHLORIDE 0.9 % IV SOLN
150.0000 mg | Freq: Once | INTRAVENOUS | Status: AC
  Administered 2020-12-24: 150 mg via INTRAVENOUS
  Filled 2020-12-24: qty 150

## 2020-12-24 MED ORDER — FENTANYL CITRATE (PF) 100 MCG/2ML IJ SOLN
INTRAMUSCULAR | Status: AC
  Filled 2020-12-24: qty 2

## 2020-12-24 MED ORDER — MIDAZOLAM HCL 2 MG/2ML IJ SOLN
INTRAMUSCULAR | Status: AC | PRN
  Administered 2020-12-24: 1 mg via INTRAVENOUS

## 2020-12-24 MED ORDER — SODIUM CHLORIDE 0.9 % IV SOLN: INTRAVENOUS | Status: DC

## 2020-12-24 MED ORDER — FENTANYL CITRATE (PF) 100 MCG/2ML IJ SOLN
INTRAMUSCULAR | Status: AC | PRN
  Administered 2020-12-24: 50 ug via INTRAVENOUS

## 2020-12-24 MED ORDER — LIDOCAINE-EPINEPHRINE 1 %-1:100000 IJ SOLN
INTRAMUSCULAR | Status: AC
  Filled 2020-12-24: qty 1

## 2020-12-24 MED ORDER — PALONOSETRON HCL INJECTION 0.25 MG/5ML
0.2500 mg | Freq: Once | INTRAVENOUS | Status: AC
  Administered 2020-12-24: 0.25 mg via INTRAVENOUS
  Filled 2020-12-24: qty 5

## 2020-12-24 MED ORDER — SODIUM CHLORIDE 0.9 % IV SOLN
10.0000 mg | Freq: Once | INTRAVENOUS | Status: AC
  Administered 2020-12-24: 10 mg via INTRAVENOUS
  Filled 2020-12-24: qty 10

## 2020-12-24 MED ORDER — DOXORUBICIN HCL CHEMO IV INJECTION 2 MG/ML
60.0000 mg/m2 | Freq: Once | INTRAVENOUS | Status: AC
  Administered 2020-12-24: 144 mg via INTRAVENOUS
  Filled 2020-12-24: qty 72

## 2020-12-24 MED ORDER — MIDAZOLAM HCL 2 MG/2ML IJ SOLN
INTRAMUSCULAR | Status: AC
  Filled 2020-12-24: qty 2

## 2020-12-24 NOTE — Procedures (Addendum)
Interventional Radiology Procedure:   Indications: Right breast cancer and pregnancy. Needs port for chemotherapy.  Procedure: Port placement  Findings: Left jugular port, tip at SVC/RA junction  Complications: None     EBL: Minimal, less than 10 ml  Plan: Discharge in one hour.  Keep port site and incisions dry for at least 24 hours.  Port is currently accessed and ready for use.    Quint Chestnut R. Anselm Pancoast, MD  Pager: [phone]

## 2020-12-24 NOTE — Patient Instructions (Signed)
[facility]  Discharge Instructions: Thank you for choosing [facility] to provide your oncology and hematology care.   If you have a lab appointment with the [facility], please go directly to the [facility] and check in at the registration area.   Wear comfortable clothing and clothing appropriate for easy access to any Portacath or PICC line.   We strive to give you quality time with your provider. You may need to reschedule your appointment if you arrive late (15 or more minutes).  Arriving late affects you and other patients whose appointments are after yours.  Also, if you miss three or more appointments without notifying the office, you may be dismissed from the clinic at the provider's discretion.      For prescription refill requests, have your pharmacy contact our office and allow 72 hours for refills to be completed.    Today you received the following chemotherapy and/or immunotherapy agents doxorubicin, cyclophosphamide      To help prevent nausea and vomiting after your treatment, we encourage you to take your nausea medication as directed.  BELOW ARE SYMPTOMS THAT SHOULD BE REPORTED IMMEDIATELY: *FEVER GREATER THAN 100.4 F (38 C) OR HIGHER *CHILLS OR SWEATING *NAUSEA AND VOMITING THAT IS NOT CONTROLLED WITH YOUR NAUSEA MEDICATION *UNUSUAL SHORTNESS OF BREATH *UNUSUAL BRUISING OR BLEEDING *URINARY PROBLEMS (pain or burning when urinating, or frequent urination) *BOWEL PROBLEMS (unusual diarrhea, constipation, pain near the anus) TENDERNESS IN MOUTH AND THROAT WITH OR WITHOUT PRESENCE OF ULCERS (sore throat, sores in mouth, or a toothache) UNUSUAL RASH, SWELLING OR PAIN  UNUSUAL VAGINAL DISCHARGE OR ITCHING   Items with * indicate a potential emergency and should be followed up as soon as possible or go to the Emergency Department if any problems should occur.  Please show the CHEMOTHERAPY ALERT CARD or IMMUNOTHERAPY ALERT  CARD at check-in to the Emergency Department and triage nurse.  Should you have questions after your visit or need to cancel or reschedule your appointment, please contact [facility]  Dept: [phone]  and follow the prompts.  Office hours are 8:00 a.m. to 4:30 p.m. Monday - Friday. Please note that voicemails left after 4:00 p.m. may not be returned until the following business day.  We are closed weekends and major holidays. You have access to a nurse at all times for urgent questions. Please call the main number to the clinic Dept: [phone] and follow the prompts.   For any non-urgent questions, you may also contact your provider using MyChart. We now offer e-Visits for anyone 60 and older to request care online for non-urgent symptoms. For details visit [email or web address].   Also download the MyChart app! Go to the app store, search "MyChart", open the app, select [facility], and log in with your MyChart username and password.  Due to Covid, a mask is required upon entering the hospital/clinic. If you do not have a mask, one will be given to you upon arrival. For doctor visits, patients may have 1 support person aged 58 or older with them. For treatment visits, patients cannot have anyone with them due to current Covid guidelines and our immunocompromised population.

## 2020-12-24 NOTE — Sedation Documentation (Signed)
Called the cancer center and spoke to Derrill Center, RN per Dr. Jeronimo Norma to ask if the port needed to remain accessed after placement due to receiving chemotherapy this afternoon. RN advised to please leave accessed. Nothing further needed at this time.

## 2020-12-24 NOTE — Sedation Documentation (Signed)
Port-a-cath left accessed and heparin locked

## 2020-12-24 NOTE — H&P (Signed)
Chief Complaint: Patient was seen in consultation today for port placement and left supraclavicular lymph node biopsy.  Referring Physician(s): Evelyn Marshall,Evelyn Marshall  Supervising Physician: Evelyn Marshall  Patient Status: [facility] - Out-pt  History of Present Illness: Evelyn Marshall is a 35 y.o. female with a past medical history significant for recently diagnosed right breast cancer who presents today for port placement. Evelyn Marshall is [redacted] weeks pregnant currently, at her 20 week visit she was found to have abnormal labs and underwent full body MRI at [facility] on 11/26/20 which showed a right axillary and subpectoral adenopathy as well as left supraclavicular lymphadenopathy. She then underwent diagnostic mammogram and [place] on 11/16 which showed a 1.2 cm mass in the right upper outer breast as well as 10 cm of pleomorphic calcifications of unknown etiology. She underwent breast mass biopsy on 12/22/20 which confirmed invasive ductal carcinoma with micro calcifications and lymphovascular space invasion. She is planned to begin chemotherapy later today and IR has been asked to place a port for durable venous access. Additionally, after discussion with Dr. Lindi Marshall by Dr. Anselm Marshall, we will perform an image guided biopsy of the left supraclavicular lymphadenopathy to further direct treatment.  Evelyn Marshall denies any complaints right now, she is having a boy and also has 4 other boys at home. She is planning to have radiation likely around summertime after she delivers and completes chemotherapy. We discussed the use of medications for moderate sedation as well as radiation for port placement and the potential fetal effects - patients states understanding and is agreeable to proceed as planned.   Past Medical History:  Diagnosis Date   Abnormal Pap smear 2012   colpo result benign   Chlamydia    Family history of breast cancer 12/15/2020   History of pre-eclampsia    HPV (human papilloma virus) infection    Hyperemesis     Hypokalemia    Pregnancy induced hypertension    Ptyalism    Vaginal Pap smear, abnormal     Past Surgical History:  Procedure Laterality Date   COLPOSCOPY     WISDOM TOOTH EXTRACTION      Allergies: Patient has no known allergies.  Medications: Prior to Admission medications   Medication Sig Start Date End Date Taking? Authorizing Provider  aspirin EC 81 MG tablet Take 81 mg by mouth daily. Swallow whole.   Yes [provider]  dexamethasone (DECADRON) 4 MG tablet Take 1 tablet (4 mg total) by mouth daily. Take 1 tablet day after chemo and 1 tablet 2 days after chemo with food 12/23/20   Evelyn Lose, MD  Doxylamine-Pyridoxine 10-10 MG TBEC Take 1 tablet by mouth 3 (three) times daily as needed (nausea).    [provider]  Iron-FA-B Cmp-C-Biot-Probiotic (FUSION PLUS) CAPS Take 1 capsule by mouth daily.    [provider]  lidocaine-prilocaine (EMLA) cream Apply to affected area once 12/23/20   Evelyn Lose, MD  ondansetron (ZOFRAN) 8 MG tablet Take 1 tablet (8 mg total) by mouth 2 (two) times daily as needed. Start on the third day after chemotherapy. 12/23/20   Evelyn Lose, MD     Family History  Problem Relation Age of Onset   Heart disease Father    Hypertension Father    Stroke Father    Breast cancer Paternal Grandmother        dx 27s   Stroke Paternal Grandmother    Breast cancer Cousin 66       maternal female cousin  Social History   Socioeconomic History   Marital status: Married    Spouse name: Not on file   Number of children: Not on file   Years of education: 16   Highest education level: Bachelor's degree (e.g., BA, AB, BS)  Occupational History   Occupation: Pharmacist, hospital  Tobacco Use   Smoking status: Never   Smokeless tobacco: Never  Vaping Use   Vaping Use: Never used  Substance and Sexual Activity   Alcohol use: No   Drug use: No   Sexual activity: Yes  Other Topics Concern   Not on file  Social History Narrative    Not on file   Social Determinants of Health   Financial Resource Strain: Not on file  Food Insecurity: Not on file  Transportation Needs: Not on file  Physical Activity: Not on file  Stress: Not on file  Social Connections: Not on file     Review of Systems: A 12 point ROS discussed and pertinent positives are indicated in the HPI above.  All other systems are negative.  Review of Systems  Constitutional:  Negative for chills and fever.  Respiratory:  Negative for cough and shortness of breath.   Cardiovascular:  Negative for chest pain.  Gastrointestinal:  Negative for abdominal pain, nausea and vomiting.  Musculoskeletal:  Negative for back pain.  Neurological:  Negative for headaches.   Vital Signs: BP (!) 141/86   Pulse 99   Temp 97.7 F (36.5 C) (Oral)   Resp 18   Ht 5\' 4"  (1.626 m)   Wt 280 lb (127 kg)   LMP 07/16/2020   SpO2 98%   BMI 48.06 kg/m   Physical Exam Vitals reviewed.  Constitutional:      General: She is not in acute distress. HENT:     Head: Normocephalic.     Mouth/Throat:     Mouth: Mucous membranes are moist.     Pharynx: Oropharynx is clear. No oropharyngeal exudate or posterior oropharyngeal erythema.  Cardiovascular:     Rate and Rhythm: Regular rhythm. Tachycardia present.  Pulmonary:     Effort: Pulmonary effort is normal.     Breath sounds: Normal breath sounds.  Abdominal:     General: There is no distension.     Palpations: Abdomen is soft.     Tenderness: There is no abdominal tenderness.  Skin:    General: Skin is warm and dry.  Neurological:     Mental Status: She is alert and oriented to person, place, and time.  Psychiatric:        Mood and Affect: Mood normal.        Behavior: Behavior normal.        Thought Content: Thought content normal.        Judgment: Judgment normal.         Imaging: ECHOCARDIOGRAM COMPLETE  Result Date: 12/23/2020    ECHOCARDIOGRAM REPORT   Patient Name:   Evelyn Marshall Date of  Exam: 12/23/2020 Medical Rec #:  [id number]     Height:       64.0 in Accession #:    [id number]    Weight:       284.1 lb Date of Birth:  02/25/1985      BSA:          2.271 m Patient Age:    35 years      BP:           135/87 mmHg Patient Gender: F  HR:           94 bpm. Exam Location:  Outpatient Procedure: 2D Echo, Strain Analysis and 3D Echo Indications:    breast cancer during pregnacy. Chemo  History:        Patient has no prior history of Echocardiogram examinations.                 Signs/Symptoms:pregnant.  Sonographer:    Johny Chess RDCS Referring Phys: [id number] Evelyn Marshall  Sonographer Comments: Image acquisition challenging due to patient body habitus. IMPRESSIONS  1. Left ventricular ejection fraction, by estimation, is 65 to 70%. The left ventricle has normal function. The left ventricle has no regional wall motion abnormalities. Left ventricular diastolic parameters were normal. The average left ventricular global longitudinal strain is -15.4 %. GLS is underestimated due to poor endocardial tracking.  2. Right ventricular systolic function is normal. The right ventricular size is normal. There is normal pulmonary artery systolic pressure.  3. The mitral valve is normal in structure. No evidence of mitral valve regurgitation. No evidence of mitral stenosis.  4. The aortic valve is normal in structure. Aortic valve regurgitation is not visualized. No aortic stenosis is present.  5. The inferior vena cava is normal in size with greater than 50% respiratory variability, suggesting right atrial pressure of 3 mmHg. FINDINGS  Left Ventricle: Left ventricular ejection fraction, by estimation, is 65 to 70%. The left ventricle has normal function. The left ventricle has no regional wall motion abnormalities. The average left ventricular global longitudinal strain is -15.4 %. The global longitudinal strain is normal despite suboptimal segment tracking. The left ventricular internal cavity size was  normal in size. There is no left ventricular hypertrophy. Left ventricular diastolic parameters were normal. Right Ventricle: The right ventricular size is normal. No increase in right ventricular wall thickness. Right ventricular systolic function is normal. There is normal pulmonary artery systolic pressure. The tricuspid regurgitant velocity is 2.10 m/s, and  with an assumed right atrial pressure of 3 mmHg, the estimated right ventricular systolic pressure is 54.6 mmHg. Left Atrium: Left atrial size was normal in size. Right Atrium: Right atrial size was normal in size. Pericardium: There is no evidence of pericardial effusion. Mitral Valve: The mitral valve is normal in structure. No evidence of mitral valve regurgitation. No evidence of mitral valve stenosis. Tricuspid Valve: The tricuspid valve is normal in structure. Tricuspid valve regurgitation is trivial. No evidence of tricuspid stenosis. Aortic Valve: The aortic valve is normal in structure. Aortic valve regurgitation is not visualized. No aortic stenosis is present. Pulmonic Valve: The pulmonic valve was normal in structure. Pulmonic valve regurgitation is not visualized. No evidence of pulmonic stenosis. Aorta: The aortic root is normal in size and structure. Venous: The inferior vena cava is normal in size with greater than 50% respiratory variability, suggesting right atrial pressure of 3 mmHg. IAS/Shunts: No atrial level shunt detected by color flow Doppler.  LEFT VENTRICLE PLAX 2D LVIDd:         3.90 cm   Diastology LVIDs:         2.10 cm   LV e' medial:    7.51 cm/s LV PW:         1.00 cm   LV E/e' medial:  10.7 LV IVS:        1.20 cm   LV e' lateral:   10.00 cm/s LVOT diam:     1.80 cm   LV E/e' lateral: 8.0 LV SV:  37 LV SV Index:   16        2D Longitudinal Strain LVOT Area:     2.54 cm  2D Strain GLS Avg:     -15.4 %  RIGHT VENTRICLE TAPSE (M-mode): 2.7 cm LEFT ATRIUM             Index        RIGHT ATRIUM           Index LA diam:         3.90 cm 1.72 cm/m   RA Area:     12.20 cm LA Vol (A2C):   45.0 ml 19.81 ml/m  RA Volume:   26.80 ml  11.80 ml/m LA Vol (A4C):   53.5 ml 23.56 ml/m LA Biplane Vol: 52.0 ml 22.90 ml/m  AORTIC VALVE LVOT Vmax:   82.90 cm/s LVOT Vmean:  55.800 cm/s LVOT VTI:    0.146 m  AORTA Ao Root diam: 2.60 cm Ao Asc diam:  2.80 cm MITRAL VALVE               TRICUSPID VALVE MV Area (PHT): 3.50 cm    TR Peak grad:   17.6 mmHg MV Decel Time: 217 msec    TR Vmax:        210.00 cm/s MV E velocity: 80.50 cm/s MV A velocity: 92.20 cm/s  SHUNTS MV E/A ratio:  0.87        Systemic VTI:  0.15 m                            Systemic Diam: 1.80 cm Glori Bickers MD Electronically signed by Glori Bickers MD Signature Date/Time: 12/23/2020/1:55:02 PM    Final     Labs:  CBC: Recent Labs    08/31/20 0621 12/23/20 1331  WBC 7.3 7.0  HGB 10.9* 10.1*  HCT 33.3* 30.7*  PLT 366 374    COAGS: No results for input(s): INR, APTT in the last 8760 hours.  BMP: Recent Labs    12/23/20 1331  NA 135  K 3.6  CL 104  CO2 21*  GLUCOSE 112*  BUN 6  CALCIUM 8.9  CREATININE 0.69  GFRNONAA >60    LIVER FUNCTION TESTS: Recent Labs    12/23/20 1331  BILITOT 0.3  AST 19  ALT 24  ALKPHOS 157*  PROT 7.4  ALBUMIN 2.9*    TUMOR MARKERS: No results for input(s): AFPTM, CEA, CA199, CHROMGRNA in the last 8760 hours.  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  Patient planned for FHR pre and post procedure per recommendation of Dr. Garwin Brothers. Additionally discussed use of Versed + Fentanyl for moderate sedation with Dr. Tama High at [facility] who states both may be used in this patient.   Risks and benefits of image-guided Port-a-catheter placement were discussed with the patient including, but not limited to bleeding, infection, pneumothorax, or  fibrin sheath development and need for additional procedures.  Risks and benefits of left supraclavicular lymph node biopsy was discussed with the patient and/or patient's family including, but not limited to bleeding, infection, damage to adjacent structures or low yield requiring additional tests.  All of the questions were answered and there is agreement to proceed.  Consent signed and in chart.   Thank you for this interesting  consult.  I greatly enjoyed meeting [place] and look forward to participating in their care.  A copy of this report was sent to the requesting provider on this date.  Electronically Signed: Joaquim Nam, PA-C 12/24/2020, 8:15 AM   I spent a total of 40 Minutes  in face to face in clinical consultation, greater than 50% of which was counseling/coordinating care for port placement and left supraclavicular lymph node biopsy.

## 2020-12-27 ENCOUNTER — Telehealth: Payer: Self-pay | Admitting: Genetic Counselor

## 2020-12-27 ENCOUNTER — Telehealth: Payer: Self-pay | Admitting: *Deleted

## 2020-12-27 ENCOUNTER — Encounter: Payer: Self-pay | Admitting: Genetic Counselor

## 2020-12-27 DIAGNOSIS — Z1379 Encounter for other screening for genetic and chromosomal anomalies: Secondary | ICD-10-CM | POA: Insufficient documentation

## 2020-12-27 NOTE — Telephone Encounter (Signed)
Revealed negative genetic testing for breast STAT panel.  Discussed that we do not know why she has breast cancer or why there is cancer in the family. It could be familial, due to a different gene that we are not testing, or maybe our current technology may not be able to pick something up.  It will be important for her to keep in contact with genetics to keep up with whether additional testing may be needed.

## 2020-12-27 NOTE — Telephone Encounter (Signed)
Evelyn Marshall returned call reporting "Leave of absence coordinator Vista Mink may be reached Ph: [phone]; Fax: [phone], E-mail: [email or web address].   [place] may return FMLA form to her for continuous leave December 23, 2020 through December 2023.   [id number] FMLA form is the only form provider is the initial form provided electronically.  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  Also have private short and long term plans.."    This nurse shared "[employer] Reasonable Accommodation" as next step for leaves beyond FMLA annual time allowed for twelve work- week time.  Good to be reviewed during FMLA in hopes of approval to begin when FMLA expires.

## 2020-12-27 NOTE — Telephone Encounter (Signed)
Message left for [employer] [phone]).  Requested return call to this forms nurse to confirm continuous leave request to begin 12/23/2020 noting treatment plan, pending delivery exceeds annual twelve work weeks  allowed noting treatment plan and pending delivery.  No forms received for short and/or long term disability for this provider.

## 2020-12-28 ENCOUNTER — Inpatient Hospital Stay (HOSPITAL_COMMUNITY): Admission: RE | Admit: 2020-12-28 | Payer: BC Managed Care – PPO | Source: Ambulatory Visit

## 2020-12-28 ENCOUNTER — Ambulatory Visit (HOSPITAL_COMMUNITY): Payer: BC Managed Care – PPO

## 2020-12-29 ENCOUNTER — Ambulatory Visit (HOSPITAL_BASED_OUTPATIENT_CLINIC_OR_DEPARTMENT_OTHER): Payer: BC Managed Care – PPO

## 2020-12-29 ENCOUNTER — Encounter: Payer: Self-pay | Admitting: *Deleted

## 2020-12-29 ENCOUNTER — Other Ambulatory Visit: Payer: Self-pay

## 2020-12-29 ENCOUNTER — Ambulatory Visit: Payer: BC Managed Care – PPO

## 2020-12-29 ENCOUNTER — Ambulatory Visit: Payer: BC Managed Care – PPO | Attending: Obstetrics | Admitting: *Deleted

## 2020-12-29 VITALS — BP 136/84 | HR 101

## 2020-12-29 DIAGNOSIS — O360120 Maternal care for anti-D [Rh] antibodies, second trimester, not applicable or unspecified: Secondary | ICD-10-CM | POA: Diagnosis not present

## 2020-12-29 DIAGNOSIS — O9229 Other disorders of breast associated with pregnancy and the puerperium: Secondary | ICD-10-CM | POA: Insufficient documentation

## 2020-12-29 DIAGNOSIS — E669 Obesity, unspecified: Secondary | ICD-10-CM

## 2020-12-29 DIAGNOSIS — O99212 Obesity complicating pregnancy, second trimester: Secondary | ICD-10-CM

## 2020-12-29 DIAGNOSIS — Z9221 Personal history of antineoplastic chemotherapy: Secondary | ICD-10-CM | POA: Diagnosis not present

## 2020-12-29 DIAGNOSIS — C50919 Malignant neoplasm of unspecified site of unspecified female breast: Secondary | ICD-10-CM | POA: Insufficient documentation

## 2020-12-29 DIAGNOSIS — O10912 Unspecified pre-existing hypertension complicating pregnancy, second trimester: Secondary | ICD-10-CM

## 2020-12-29 DIAGNOSIS — Z3A23 23 weeks gestation of pregnancy: Secondary | ICD-10-CM

## 2020-12-29 DIAGNOSIS — O10012 Pre-existing essential hypertension complicating pregnancy, second trimester: Secondary | ICD-10-CM | POA: Diagnosis not present

## 2020-12-30 ENCOUNTER — Other Ambulatory Visit: Payer: Self-pay | Admitting: *Deleted

## 2020-12-30 ENCOUNTER — Ambulatory Visit: Payer: Self-pay | Admitting: Genetic Counselor

## 2020-12-30 DIAGNOSIS — Z1379 Encounter for other screening for genetic and chromosomal anomalies: Secondary | ICD-10-CM

## 2020-12-30 DIAGNOSIS — C50919 Malignant neoplasm of unspecified site of unspecified female breast: Secondary | ICD-10-CM

## 2020-12-30 DIAGNOSIS — Z170 Estrogen receptor positive status [ER+]: Secondary | ICD-10-CM

## 2020-12-30 DIAGNOSIS — Z803 Family history of malignant neoplasm of breast: Secondary | ICD-10-CM

## 2020-12-30 DIAGNOSIS — O10912 Unspecified pre-existing hypertension complicating pregnancy, second trimester: Secondary | ICD-10-CM

## 2020-12-30 DIAGNOSIS — O99212 Obesity complicating pregnancy, second trimester: Secondary | ICD-10-CM

## 2020-12-30 DIAGNOSIS — C50411 Malignant neoplasm of upper-outer quadrant of right female breast: Secondary | ICD-10-CM

## 2020-12-30 NOTE — Telephone Encounter (Signed)
Revealed negative pan-cancer panel testing.

## 2020-12-30 NOTE — Progress Notes (Signed)
HPI:   Evelyn. Evelyn Marshall was previously seen in the [place] clinic due to a personal history of breast cancer and concerns regarding a hereditary predisposition to cancer. Please refer to our prior cancer genetics clinic note for more information regarding our discussion, assessment and recommendations, at the time. Evelyn Marshall recent genetic test results were disclosed to her, as were recommendations warranted by these results. These results and recommendations are discussed in more detail below.  CANCER HISTORY:  Oncology History  Malignant neoplasm of upper-outer quadrant of right breast in female, estrogen receptor positive ([place])  12/08/2020 Initial Diagnosis   21-week pregnancy: MRI at [place] noncontrast revealed right axillary mass 3.7 cm, subpectoral lymph nodes, left supraclavicular lymph node, multiple level 2 and 3 lymph nodes in the right axilla, 2 breast masses 1.1 cm largest size.  Mammogram in [employer] revealed 3 small masses at 12:00 1.2 cm, 0.6 cm, 0.9 cm and 5 abnormal axillary lymph nodes, pleomorphic calcifications measuring 10 cm, biopsy revealed grade 2 IDC with necrosis and calcifications ER 30%, PR 0%, HER2 positive, Ki-67 30 to 40%   12/15/2020 Cancer Staging   Staging form: Breast, AJCC 8th Edition - Clinical: Stage IV (cT3, cN2, pM1, G2, ER+, PR-, HER2+) - Signed by Nicholas Lose, MD on 12/15/2020 Histologic grading system: 3 grade system    12/24/2020 -  Chemotherapy   Patient is on Treatment Plan : BREAST Adjuvant AC q21d     12/29/2020 Genetic Testing   Negative hereditary cancer genetic testing: no pathogenic variants detected in [facility] BRCAPlus Panel or [facility] CustomNext-Cancer +RNAinsight Panel.  The report dates are 12/23/2020 and 12/29/2020.   The BRCAplus panel offered by [employer] and includes sequencing and deletion/duplication analysis for the following 8 genes: ATM, BRCA1, BRCA2, CDH1, CHEK2, PALB2, PTEN, and TP53.  The  CustomNext-Cancer+RNAinsight panel offered by [employer] [facility] includes sequencing and rearrangement analysis for the following 47 genes:  APC, ATM, AXIN2, BARD1, BMPR1A, BRCA1, BRCA2, BRIP1, CDH1, CDK4, CDKN2A, CHEK2, DICER1, EPCAM, GREM1, HOXB13, MEN1, MLH1, MSH2, MSH3, MSH6, MUTYH, NBN, NF1, NF2, NTHL1, PALB2, PMS2, POLD1, POLE, PTEN, RAD51C, RAD51D, RECQL, RET, SDHA, SDHAF2, SDHB, SDHC, SDHD, SMAD4, SMARCA4, STK11, TP53, TSC1, TSC2, and VHL.  RNA data is routinely analyzed for use in variant interpretation for all genes.     FAMILY HISTORY:  We obtained a detailed, 4-generation family history.  Significant diagnoses are listed below: Family History  Problem Relation Age of Onset   Breast cancer Paternal Grandmother        dx 24s   Breast cancer Cousin 70       maternal female cousin      Evelyn Marshall has three sons--ages 16, 5, and 2.  She is approximately [redacted] weeks pregnant.  She has one brother and one sister, both in their 33s.  No cancer reported in her children, siblings, or nieces/nephews.    Evelyn Marshall mother is 62 and has not had cancer.  Evelyn Marshall reported one maternal cousin diagnosed with breast cancer at the age of 107.  Evelyn Marshall father is 68 and has not had cancer.  Evelyn Marshall reported her paternal grandmother was diagnosed with breast cancer in her 51s.  No other family history of cancer was reported.    Evelyn Marshall is unaware of previous family history of genetic testing for hereditary cancer risks. There is no reported Ashkenazi Jewish ancestry. There is no known consanguinity.  GENETIC TEST RESULTS:  The [facility] CustomNext-Cancer +RNAinsight Panel found no pathogenic  mutations. The CustomNext-Cancer+RNAinsight panel offered by [employer] includes sequencing and rearrangement analysis for the following 47 genes:  APC, ATM, AXIN2, BARD1, BMPR1A, BRCA1, BRCA2, BRIP1, CDH1, CDK4, CDKN2A, CHEK2, DICER1, EPCAM, GREM1, HOXB13, MEN1, MLH1, MSH2, MSH3, MSH6, MUTYH, NBN, NF1, NF2, NTHL1,  PALB2, PMS2, POLD1, POLE, PTEN, RAD51C, RAD51D, RECQL, RET, SDHA, SDHAF2, SDHB, SDHC, SDHD, SMAD4, SMARCA4, STK11, TP53, TSC1, TSC2, and VHL.  RNA data is routinely analyzed for use in variant interpretation for all genes.  The test report has been scanned into EPIC and is located under the Molecular Pathology section of the Results Review tab.  A portion of the result report is included below for reference. Genetic testing reported out on December 29, 2020.     Even though a pathogenic variant was not identified, possible explanations for the cancer in the family may include: There may be no hereditary risk for cancer in the family. The cancers in Evelyn Marshall and/or her family may be sporadic/familial or due to other genetic and environmental factors. There may be a gene mutation in one of these genes that current testing methods cannot detect but that chance is small. There could be another gene that has not yet been discovered, or that we have not yet tested, that is responsible for the cancer diagnoses in the family.  It is also possible there is a hereditary cause for the cancer in the family that Evelyn Marshall did not inherit.   Therefore, it is important to remain in touch with cancer genetics in the future so that we can continue to offer Evelyn Marshall the most up to date genetic testing.   ADDITIONAL GENETIC TESTING:  We discussed with Evelyn Marshall that her genetic testing was fairly extensive.  If there are additional relevant genes identified to increase cancer risk that can be analyzed in the future, we would be happy to discuss and coordinate this testing at that time.    CANCER SCREENING RECOMMENDATIONS:  Evelyn Marshall test result is considered negative (normal).  This means that we have not identified a hereditary cause for her personal history of breast cancer at this time.   RECOMMENDATIONS FOR FAMILY MEMBERS:   Since she did not inherit a identifiable mutation in a cancer predisposition gene  included on this panel, her children could not have inherited a known mutation from her in one of these genes. Individuals in this family might be at some increased risk of developing cancer, over the general population risk, due to the family history of cancer.  Individuals in the family should notify their providers of the family history of cancer. We recommend women in this family have a yearly mammogram beginning at age 70, or 65 years younger than the earliest onset of cancer, an annual clinical breast exam, and perform monthly breast self-exams.   Other members of the family may still carry a pathogenic variant in one of these genes that Evelyn Marshall did not inherit. Based on the family history of breast cancer in her deceased maternal cousin in her 48s, we recommend first degree relatives of this cousin have genetic counseling/testing.   FOLLOW-UP:  Lastly, we discussed with Evelyn Marshall that cancer genetics is a rapidly advancing field and it is possible that new genetic tests will be appropriate for her and/or her family members in the future. We encouraged her to remain in contact with cancer genetics on an annual basis so we can update her personal and family histories and let her know of advances  in cancer genetics that may benefit this family.   Our contact number was provided. Evelyn Marshall questions were answered to her satisfaction, and she knows she is welcome to call us at anytime with additional questions or concerns.   Aparna Vanderweele M. Joette Catching, [place], [facility] Genetic Counselor [email or web address] (P) [phone]

## 2020-12-31 ENCOUNTER — Other Ambulatory Visit: Payer: Self-pay

## 2020-12-31 ENCOUNTER — Inpatient Hospital Stay: Payer: BC Managed Care – PPO | Admitting: Adult Health

## 2020-12-31 ENCOUNTER — Encounter: Payer: Self-pay | Admitting: Adult Health

## 2020-12-31 ENCOUNTER — Inpatient Hospital Stay: Payer: BC Managed Care – PPO

## 2020-12-31 ENCOUNTER — Encounter: Payer: Self-pay | Admitting: Hematology and Oncology

## 2020-12-31 ENCOUNTER — Other Ambulatory Visit (HOSPITAL_COMMUNITY): Payer: BC Managed Care – PPO

## 2020-12-31 ENCOUNTER — Encounter: Payer: Self-pay | Admitting: *Deleted

## 2020-12-31 VITALS — BP 141/88 | HR 102 | Temp 97.9°F | Resp 20 | Ht 64.0 in | Wt 284.2 lb

## 2020-12-31 DIAGNOSIS — Z95828 Presence of other vascular implants and grafts: Secondary | ICD-10-CM | POA: Insufficient documentation

## 2020-12-31 DIAGNOSIS — C50411 Malignant neoplasm of upper-outer quadrant of right female breast: Secondary | ICD-10-CM | POA: Diagnosis not present

## 2020-12-31 DIAGNOSIS — D500 Iron deficiency anemia secondary to blood loss (chronic): Secondary | ICD-10-CM

## 2020-12-31 DIAGNOSIS — Z170 Estrogen receptor positive status [ER+]: Secondary | ICD-10-CM

## 2020-12-31 DIAGNOSIS — Z5111 Encounter for antineoplastic chemotherapy: Secondary | ICD-10-CM | POA: Diagnosis not present

## 2020-12-31 HISTORY — DX: Presence of other vascular implants and grafts: Z95.828

## 2020-12-31 LAB — CBC WITH DIFFERENTIAL (CANCER CENTER ONLY)
Abs Immature Granulocytes: 0.04 10*3/uL (ref 0.00–0.07)
Basophils Absolute: 0 10*3/uL (ref 0.0–0.1)
Basophils Relative: 1 %
Eosinophils Absolute: 0 10*3/uL (ref 0.0–0.5)
Eosinophils Relative: 1 %
HCT: 28.1 % — ABNORMAL LOW (ref 36.0–46.0)
Hemoglobin: 9 g/dL — ABNORMAL LOW (ref 12.0–15.0)
Immature Granulocytes: 1 %
Lymphocytes Relative: 18 %
Lymphs Abs: 0.6 10*3/uL — ABNORMAL LOW (ref 0.7–4.0)
MCH: 23.6 pg — ABNORMAL LOW (ref 26.0–34.0)
MCHC: 32 g/dL (ref 30.0–36.0)
MCV: 73.8 fL — ABNORMAL LOW (ref 80.0–100.0)
Monocytes Absolute: 0.1 10*3/uL (ref 0.1–1.0)
Monocytes Relative: 3 %
Neutro Abs: 2.3 10*3/uL (ref 1.7–7.7)
Neutrophils Relative %: 76 %
Platelet Count: 371 10*3/uL (ref 150–400)
RBC: 3.81 MIL/uL — ABNORMAL LOW (ref 3.87–5.11)
RDW: 14.9 % (ref 11.5–15.5)
WBC Count: 3 10*3/uL — ABNORMAL LOW (ref 4.0–10.5)
nRBC: 0 % (ref 0.0–0.2)

## 2020-12-31 LAB — CMP (CANCER CENTER ONLY)
ALT: 21 U/L (ref 0–44)
AST: 22 U/L (ref 15–41)
Albumin: 2.8 g/dL — ABNORMAL LOW (ref 3.5–5.0)
Alkaline Phosphatase: 132 U/L — ABNORMAL HIGH (ref 38–126)
Anion gap: 11 (ref 5–15)
BUN: 8 mg/dL (ref 6–20)
CO2: 19 mmol/L — ABNORMAL LOW (ref 22–32)
Calcium: 9.1 mg/dL (ref 8.9–10.3)
Chloride: 103 mmol/L (ref 98–111)
Creatinine: 0.59 mg/dL (ref 0.44–1.00)
GFR, Estimated: >60 mL/min (ref >60)
Glucose, Bld: 75 mg/dL (ref 70–99)
Potassium: 4.3 mmol/L (ref 3.5–5.1)
Sodium: 133 mmol/L — ABNORMAL LOW (ref 135–145)
Total Bilirubin: 0.4 mg/dL (ref 0.3–1.2)
Total Protein: 7.2 g/dL (ref 6.5–8.1)

## 2020-12-31 MED ORDER — SODIUM CHLORIDE 0.9% FLUSH
10.0000 mL | Freq: Once | INTRAVENOUS | Status: AC
  Administered 2020-12-31: 10 mL

## 2020-12-31 MED ORDER — HEPARIN SOD (PORK) LOCK FLUSH 100 UNIT/ML IV SOLN
500.0000 [IU] | Freq: Once | INTRAVENOUS | Status: AC
  Administered 2020-12-31: 500 [IU]

## 2020-12-31 NOTE — Assessment & Plan Note (Signed)
12/08/2020:21-week pregnancy: MRI at [place] noncontrast revealed right axillary mass 3.7 cm, subpectoral lymph nodes, left supraclavicular lymph node, multiple level 2 and 3 lymph nodes in the right axilla, 2 breast masses 1.1 cm largest size.  Mammogram in [place] revealed 3 small masses at 12:00 1.2 cm, 0.6 cm, 0.9 cm and 5 abnormal axillary lymph nodes, pleomorphic calcifications measuring 10 cm, biopsy revealed grade 2 IDC with necrosis and calcifications ER 30%, PR 0%, HER2 positive, Ki-67 30 to 40%  Pathology and radiology counseling: Discussed with the patient, the details of pathology including the type of breast cancer,the clinical staging, the significance of ER, PR and HER-2/neu receptors and the implications for treatment. After reviewing the pathology in detail, we proceeded to discuss the different treatment options between surgery, radiation, chemotherapy, antiestrogen therapies.  Treatment plan: 1.  Neoadjuvant chemotherapy with Adriamycin and Cytoxan every 3 weeks x4 2. delivery of the baby 3.  Continue neoadjuvant therapy with Taxol Herceptin and Perjeta 4.  Mastectomy with ALND 5.  Adjuvant radiation therapy 6.  Adjuvant antiestrogen therapy with ovarian function suppression Genetic testing   Adwoa is here for follow-up after receiving her first cycle of neoadjuvant chemotherapy.  She tolerated her treatment well.  We reviewed her labs and her white blood cells are only slightly decreased.  She also had minimal nausea that was resolved with Zofran.  Tylor has a history of iron deficiency anemia with her pregnancies.  Her hemoglobin today is 9.  I have added on a ferritin level and an iron panel to her next lab draw.  She has tolerated Feraheme the in the past well.  I encouraged her to continue her oral iron as well.  We will see [name] back in 2 weeks for labs her second cycle of neoadjuvant chemotherapy and follow-up.

## 2020-12-31 NOTE — Progress Notes (Signed)
Evelyn Marshall Follow up:    Evelyn Marshall, Evelyn Marshall [place] [address]   DIAGNOSIS:  Marshall Staging  Malignant neoplasm of upper-outer quadrant of right breast in female, estrogen receptor positive ([place]) Staging form: Breast, AJCC 8th Edition - Clinical: Stage IV (cT3, cN2, pM1, G2, ER+, PR-, HER2+) - Signed by Nicholas Lose, MD on 12/15/2020 Histologic grading system: 3 grade system   SUMMARY OF ONCOLOGIC HISTORY: Oncology History  Malignant neoplasm of upper-outer quadrant of right breast in female, estrogen receptor positive ([place])  12/08/2020 Initial Diagnosis   21-week pregnancy: MRI at [place] noncontrast revealed right axillary mass 3.7 cm, subpectoral lymph nodes, left supraclavicular lymph node, multiple level 2 and 3 lymph nodes in the right axilla, 2 breast masses 1.1 cm largest size.  Mammogram in [place] revealed 3 small masses at 12:00 1.2 cm, 0.6 cm, 0.9 cm and 5 abnormal axillary lymph nodes, pleomorphic calcifications measuring 10 cm, biopsy revealed grade 2 IDC with necrosis and calcifications ER 30%, PR 0%, HER2 positive, Ki-67 30 to 40%   12/15/2020 Marshall Staging   Staging form: Breast, AJCC 8th Edition - Clinical: Stage IV (cT3, cN2, pM1, G2, ER+, PR-, HER2+) - Signed by Nicholas Lose, MD on 12/15/2020 Histologic grading system: 3 grade system    12/24/2020 -  Chemotherapy   Patient is on Treatment Plan : BREAST Adjuvant AC q21d     12/29/2020 Genetic Testing   Negative hereditary Marshall genetic testing: no pathogenic variants detected in [facility] BRCAPlus Panel or [facility] CustomNext-Marshall +RNAinsight Panel.  The report dates are 12/23/2020 and 12/29/2020.   The BRCAplus panel offered by [employer] and includes sequencing and deletion/duplication analysis for the following 8 genes: ATM, BRCA1, BRCA2, CDH1, CHEK2, PALB2, PTEN, and TP53.  The CustomNext-Marshall+RNAinsight panel offered by [facility] includes sequencing and rearrangement  analysis for the following 47 genes:  APC, ATM, AXIN2, BARD1, BMPR1A, BRCA1, BRCA2, BRIP1, CDH1, CDK4, CDKN2A, CHEK2, DICER1, EPCAM, GREM1, HOXB13, MEN1, MLH1, MSH2, MSH3, MSH6, MUTYH, NBN, NF1, NF2, NTHL1, PALB2, PMS2, POLD1, POLE, PTEN, RAD51C, RAD51D, RECQL, RET, SDHA, SDHAF2, SDHB, SDHC, SDHD, SMAD4, SMARCA4, STK11, TP53, TSC1, TSC2, and VHL.  RNA data is routinely analyzed for use in variant interpretation for all genes.     CURRENT THERAPY: cycle 1 day 8 adriamycin/cytoxan  INTERVAL HISTORY: Evelyn Marshall 35 y.o. female returns for evaluation after receiving her first cycle of Adriamycin and Cytoxan.  She is receiving this every 21 days.  She notes she is [redacted] weeks pregnant.  She says that she tolerated her chemotherapy quite well.  She is mildly fatigued.  We discussed the fact that she has a history of iron deficiency anemia with her prior pregnancy that did require IV iron infusion.  She wonders if she will need this again since she is pregnant and also receiving chemotherapy.  She said that she only had mild nausea but otherwise chemotherapy went much better than expected.   Patient Active Problem List   Diagnosis Date Noted   Port-A-Cath in place 12/31/2020   Genetic testing 12/27/2020   Malignant neoplasm of upper-outer quadrant of right breast in female, estrogen receptor positive ([place]) 12/15/2020   Family history of breast Marshall 12/15/2020   Severe preeclampsia, third trimester 02/25/2019   Hyperemesis gravidarum 08/20/2018   Preeclampsia, third trimester 11/15/2015   Pre-eclampsia during pregnancy in third trimester, antepartum 11/15/2015   Preeclampsia 11/15/2015   Mild preeclampsia 11/12/2015   Hypokalemia, inadequate intake 06/17/2015  Ptyalism 05/18/2015   Chronic hypertension in pregnancy 05/18/2015   Morbid obesity with BMI of 40.0-44.9, adult ([place]) 05/18/2015   Rh negative, maternal 05/18/2015    has No Known Allergies.  MEDICAL HISTORY: Past Medical History:   Diagnosis Date   Abnormal Pap smear 2012   colpo result benign   Marshall ([place])    Chlamydia    Family history of breast Marshall 12/15/2020   History of pre-eclampsia    HPV (human papilloma virus) infection    Hyperemesis    Hypokalemia    Pregnancy induced hypertension    Ptyalism    Vaginal Pap smear, abnormal     SURGICAL HISTORY: Past Surgical History:  Procedure Laterality Date   COLPOSCOPY     IR IMAGING GUIDED PORT INSERTION  12/24/2020   WISDOM TOOTH EXTRACTION      SOCIAL HISTORY: Social History   Socioeconomic History   Marital status: Married    Spouse name: Not on file   Number of children: Not on file   Years of education: 16   Highest education level: Bachelor's degree (e.g., BA, AB, BS)  Occupational History   Occupation: Pharmacist, hospital  Tobacco Use   Smoking status: Never   Smokeless tobacco: Never  Vaping Use   Vaping Use: Never used  Substance and Sexual Activity   Alcohol use: No   Drug use: No   Sexual activity: Yes  Other Topics Concern   Not on file  Social History Narrative   Not on file   Social Determinants of Health   Financial Resource Strain: Not on file  Food Insecurity: Not on file  Transportation Needs: Not on file  Physical Activity: Not on file  Stress: Not on file  Social Connections: Not on file  Intimate Partner Violence: Not on file    FAMILY HISTORY: Family History  Problem Relation Age of Onset   Heart disease Father    Hypertension Father    Stroke Father    Breast Marshall Paternal Grandmother        dx 70s   Stroke Paternal Grandmother    Breast Marshall Cousin 74       maternal female cousin    Review of Systems  Constitutional:  Positive for fatigue. Negative for appetite change, chills, fever and unexpected weight change.  HENT:   Negative for hearing loss, lump/mass and trouble swallowing.   Eyes:  Negative for eye problems and icterus.  Respiratory:  Negative for chest tightness, cough and shortness of  breath.   Cardiovascular:  Negative for chest pain, leg swelling and palpitations.  Gastrointestinal:  Positive for nausea. Negative for abdominal distention, abdominal pain, constipation, diarrhea and vomiting.  Endocrine: Negative for hot flashes.  Genitourinary:  Negative for difficulty urinating.   Musculoskeletal:  Negative for arthralgias.  Skin:  Negative for itching and rash.  Neurological:  Negative for dizziness, extremity weakness, headaches and numbness.  Hematological:  Negative for adenopathy. Does not bruise/bleed easily.  Psychiatric/Behavioral:  Negative for depression. The patient is not nervous/anxious.      PHYSICAL EXAMINATION  ECOG PERFORMANCE STATUS: 1 - Symptomatic but completely ambulatory  Vitals:   12/31/20 1419  BP: (!) 141/88  Pulse: (!) 102  Resp: 20  Temp: 97.9 F (36.6 C)  SpO2: 100%    Physical Exam Constitutional:      General: She is not in acute distress.    Appearance: Normal appearance. She is not toxic-appearing.  HENT:     Head: Normocephalic and atraumatic.  Eyes:     General: No scleral icterus. Cardiovascular:     Rate and Rhythm: Normal rate and regular rhythm.     Pulses: Normal pulses.     Heart sounds: Normal heart sounds.  Pulmonary:     Effort: Pulmonary effort is normal.     Breath sounds: Normal breath sounds.  Abdominal:     General: Abdomen is flat. Bowel sounds are normal. There is no distension.     Palpations: Abdomen is soft.     Tenderness: There is no abdominal tenderness.  Musculoskeletal:        General: No swelling.     Cervical back: Neck supple.  Lymphadenopathy:     Cervical: No cervical adenopathy.  Skin:    General: Skin is warm and dry.     Findings: No rash.  Neurological:     General: No focal deficit present.     Mental Status: She is alert.  Psychiatric:        Mood and Affect: Mood normal.        Behavior: Behavior normal.    LABORATORY DATA:  CBC    Component Value Date/Time    WBC 3.0 (L) 12/31/2020 1411   WBC 7.3 08/31/2020 0621   RBC 3.81 (L) 12/31/2020 1411   HGB 9.0 (L) 12/31/2020 1411   HCT 28.1 (L) 12/31/2020 1411   PLT 371 12/31/2020 1411   MCV 73.8 (L) 12/31/2020 1411   MCH 23.6 (L) 12/31/2020 1411   MCHC 32.0 12/31/2020 1411   RDW 14.9 12/31/2020 1411   LYMPHSABS 0.6 (L) 12/31/2020 1411   MONOABS 0.1 12/31/2020 1411   EOSABS 0.0 12/31/2020 1411   BASOSABS 0.0 12/31/2020 1411    CMP     Component Value Date/Time   NA 133 (L) 12/31/2020 1411   K 4.3 12/31/2020 1411   CL 103 12/31/2020 1411   CO2 19 (L) 12/31/2020 1411   GLUCOSE 75 12/31/2020 1411   BUN 8 12/31/2020 1411   CREATININE 0.59 12/31/2020 1411   CALCIUM 9.1 12/31/2020 1411   PROT 7.2 12/31/2020 1411   ALBUMIN 2.8 (L) 12/31/2020 1411   AST 22 12/31/2020 1411   ALT 21 12/31/2020 1411   ALKPHOS 132 (H) 12/31/2020 1411   BILITOT 0.4 12/31/2020 1411   GFRNONAA >60 12/31/2020 1411   GFRAA >60 02/28/2019 5573       PENDING LABS:   RADIOGRAPHIC STUDIES:  No results found.   PATHOLOGY:     ASSESSMENT and THERAPY PLAN:   Malignant neoplasm of upper-outer quadrant of right breast in female, estrogen receptor positive ([place]) 12/08/2020:21-week pregnancy: MRI at [place] noncontrast revealed right axillary mass 3.7 cm, subpectoral lymph nodes, left supraclavicular lymph node, multiple level 2 and 3 lymph nodes in the right axilla, 2 breast masses 1.1 cm largest size.  Mammogram in [place] revealed 3 small masses at 12:00 1.2 cm, 0.6 cm, 0.9 cm and 5 abnormal axillary lymph nodes, pleomorphic calcifications measuring 10 cm, biopsy revealed grade 2 IDC with necrosis and calcifications ER 30%, PR 0%, HER2 positive, Ki-67 30 to 40%  Pathology and radiology counseling: Discussed with the patient, the details of pathology including the type of breast Marshall,the clinical staging, the significance of ER, PR and HER-2/neu receptors and the implications for treatment. After reviewing the  pathology in detail, we proceeded to discuss the different treatment options between surgery, radiation, chemotherapy, antiestrogen therapies.  Treatment plan: 1.  Neoadjuvant chemotherapy with Adriamycin and Cytoxan every 3 weeks x4 2. delivery  of the baby 3.  Continue neoadjuvant therapy with Taxol Herceptin and Perjeta 4.  Mastectomy with ALND 5.  Adjuvant radiation therapy 6.  Adjuvant antiestrogen therapy with ovarian function suppression Genetic testing   Vanette is here for follow-up after receiving her first cycle of neoadjuvant chemotherapy.  She tolerated her treatment well.  We reviewed her labs and her white blood cells are only slightly decreased.  She also had minimal nausea that was resolved with Zofran.  Avaleen has a history of iron deficiency anemia with her pregnancies.  Her hemoglobin today is 9.  I have added on a ferritin level and an iron panel to her next lab draw.  She has tolerated Feraheme the in the past well.  I encouraged her to continue her oral iron as well.  We will see Evelyn back in 2 weeks for labs her second cycle of neoadjuvant chemotherapy and follow-up.   Orders Placed This Encounter  Procedures   Ferritin    Standing Status:   Future    Standing Expiration Date:   12/31/2021   Iron and TIBC    Standing Status:   Future    Standing Expiration Date:   12/31/2021    All questions were answered. The patient knows to call the clinic with any problems, questions or concerns. We can certainly see the patient much sooner if necessary.  Total encounter time: 20 minutes in face-to-face visit time, chart review, lab review, order entry, care coordination, and documentation of the encounter.   Wilber Bihari, NP 12/31/20 3:50 PM Medical Oncology and Hematology [facility] [place], [place] [address] Tel. [phone]    Fax. [phone]  *Total Encounter Time as defined by the Centers for Medicare and Medicaid Services  includes, in addition to the face-to-face time of a patient visit (documented in the note above) non-face-to-face time: obtaining and reviewing outside history, ordering and reviewing medications, tests or procedures, care coordination (communications with other health care professionals or caregivers) and documentation in the medical record.

## 2021-01-03 ENCOUNTER — Encounter: Payer: Self-pay | Admitting: *Deleted

## 2021-01-04 ENCOUNTER — Ambulatory Visit: Payer: BC Managed Care – PPO | Admitting: Adult Health

## 2021-01-04 ENCOUNTER — Other Ambulatory Visit: Payer: BC Managed Care – PPO

## 2021-01-05 ENCOUNTER — Ambulatory Visit: Payer: BC Managed Care – PPO | Admitting: Physical Therapy

## 2021-01-12 ENCOUNTER — Other Ambulatory Visit: Payer: Self-pay | Admitting: *Deleted

## 2021-01-12 DIAGNOSIS — D500 Iron deficiency anemia secondary to blood loss (chronic): Secondary | ICD-10-CM

## 2021-01-12 DIAGNOSIS — Z170 Estrogen receptor positive status [ER+]: Secondary | ICD-10-CM

## 2021-01-12 NOTE — Progress Notes (Signed)
Patient Care Team: Caffie Damme, MD as PCP - General (Family Medicine) Serena Croissant, MD as Consulting Physician (Hematology and Oncology) Maxie Better, MD as Consulting Physician (Obstetrics and Gynecology) Glenna [place], MD as Consulting Physician (Plastic Surgery)  DIAGNOSIS:    ICD-10-CM   1. Malignant neoplasm of upper-outer quadrant of right breast in female, estrogen receptor positive (HCC)  C50.411    Z17.0       SUMMARY OF ONCOLOGIC HISTORY: Oncology History  Malignant neoplasm of upper-outer quadrant of right breast in female, estrogen receptor positive (HCC)  12/08/2020 Initial Diagnosis   21-week pregnancy: [facility] at [facility] noncontrast revealed right axillary mass 3.7 cm, subpectoral lymph nodes, left supraclavicular lymph node, multiple level 2 and 3 lymph nodes in the right axilla, 2 breast masses 1.1 cm largest size.  Mammogram in [place] revealed 3 small masses at 12:00 1.2 cm, 0.6 cm, 0.9 cm and 5 abnormal axillary lymph nodes, pleomorphic calcifications measuring 10 cm, biopsy revealed grade 2 IDC with necrosis and calcifications ER 30%, PR 0%, HER2 positive, Ki-67 30 to 40%   12/15/2020 Cancer Staging   Staging form: Breast, AJCC 8th Edition - Clinical: Stage IV (cT3, cN2, pM1, G2, ER+, PR-, HER2+) - Signed by Serena Croissant, MD on 12/15/2020 Histologic grading system: 3 grade system    12/24/2020 -  Chemotherapy   Patient is on Treatment Plan : BREAST Adjuvant AC q21d     12/29/2020 Genetic Testing   Negative hereditary cancer genetic testing: no pathogenic variants detected in [facility] BRCAPlus Panel or [facility] CustomNext-Cancer +RNAinsight Panel.  The report dates are 12/23/2020 and 12/29/2020.   The BRCAplus panel offered by [employer] and includes sequencing and deletion/duplication analysis for the following 8 genes: ATM, BRCA1, BRCA2, CDH1, CHEK2, PALB2, PTEN, and TP53.  The CustomNext-Cancer+RNAinsight panel offered by [employer] includes sequencing  and rearrangement analysis for the following 47 genes:  APC, ATM, AXIN2, BARD1, BMPR1A, BRCA1, BRCA2, BRIP1, CDH1, CDK4, CDKN2A, CHEK2, DICER1, EPCAM, GREM1, HOXB13, MEN1, MLH1, MSH2, MSH3, MSH6, MUTYH, NBN, NF1, NF2, NTHL1, PALB2, PMS2, POLD1, POLE, PTEN, RAD51C, RAD51D, RECQL, RET, SDHA, SDHAF2, SDHB, SDHC, SDHD, SMAD4, SMARCA4, STK11, TP53, TSC1, TSC2, and VHL.  RNA data is routinely analyzed for use in variant interpretation for all genes.     CHIEF COMPLIANT: Cycle 2 Adriamycin and Cytoxan  INTERVAL HISTORY: Evelyn Marshall is a 35 y.o. with above-mentioned history of right breast mass. She is currently pregnant and presented with abnormal prenatal labs at 20 weeks. She presents to the clinic today for cycle 2 of chemotherapy. She tolerated first cycle of chemotherapy extremely well without any nausea or vomiting.  She did not have any diarrhea or constipation.  She takes prenatal vitamin for iron deficiency.  Today we are obtaining iron results.  ALLERGIES:  has No Known Allergies.  MEDICATIONS:  Current Outpatient Medications  Medication Sig Dispense Refill   aspirin EC 81 MG tablet Take 81 mg by mouth daily. Swallow whole.     dexamethasone (DECADRON) 4 MG tablet Take 1 tablet (4 mg total) by mouth daily. Take 1 tablet day after chemo and 1 tablet 2 days after chemo with food 8 tablet 1   Doxylamine-Pyridoxine 10-10 MG TBEC Take 1 tablet by mouth 3 (three) times daily as needed (nausea).     Iron-FA-B Cmp-C-Biot-Probiotic (FUSION PLUS) CAPS Take 1 capsule by mouth daily.     lidocaine-prilocaine (EMLA) cream Apply to affected area once 30 g 3   ondansetron (ZOFRAN) 8 MG tablet  Take 1 tablet (8 mg total) by mouth 2 (two) times daily as needed. Start on the third day after chemotherapy. 30 tablet 1   No current facility-administered medications for this visit.    PHYSICAL EXAMINATION: ECOG PERFORMANCE STATUS: 1 - Symptomatic but completely ambulatory  Vitals:   01/13/21 1044  BP: (!)  141/82  Pulse: (!) 102  Resp: 18  Temp: (!) 97.5 F (36.4 C)  SpO2: 100%   Filed Weights   01/13/21 1044  Weight: 287 lb 11.2 oz (130.5 kg)      LABORATORY DATA:  I have reviewed the data as listed CMP Latest Ref Rng & Units 12/31/2020 12/23/2020 02/28/2019  Glucose 70 - 99 mg/dL 75 112(H) 87  BUN 6 - 20 mg/dL 8 6 5(L)  Creatinine 0.44 - 1.00 mg/dL 0.59 0.69 0.58  Sodium 135 - 145 mmol/L 133(L) 135 138  Potassium 3.5 - 5.1 mmol/L 4.3 3.6 3.9  Chloride 98 - 111 mmol/L 103 104 106  CO2 22 - 32 mmol/L 19(L) 21(L) 23  Calcium 8.9 - 10.3 mg/dL 9.1 8.9 8.3(L)  Total Protein 6.5 - 8.1 g/dL 7.2 7.4 5.8(L)  Total Bilirubin 0.3 - 1.2 mg/dL 0.4 0.3 0.2(L)  Alkaline Phos 38 - 126 U/L 132(H) 157(H) 119  AST 15 - 41 U/L $Remo'22 19 20  'aGvJW$ ALT 0 - 44 U/L $Remo'21 24 18    'IFTbl$ Lab Results  Component Value Date   WBC 5.8 01/13/2021   HGB 8.9 (L) 01/13/2021   HCT 28.7 (L) 01/13/2021   MCV 75.3 (L) 01/13/2021   PLT 427 (H) 01/13/2021   NEUTROABS 4.3 01/13/2021    ASSESSMENT & PLAN:  Malignant neoplasm of upper-outer quadrant of right breast in female, estrogen receptor positive ([name]) 12/08/2020:21-week pregnancy: MRI at [place] noncontrast revealed right axillary mass 3.7 cm, subpectoral lymph nodes, left supraclavicular lymph node, multiple level 2 and 3 lymph nodes in the right axilla, 2 breast masses 1.1 cm largest size.  Mammogram in [place] revealed 3 small masses at 12:00 1.2 cm, 0.6 cm, 0.9 cm and 5 abnormal axillary lymph nodes, pleomorphic calcifications measuring 10 cm, biopsy revealed grade 2 IDC with necrosis and calcifications ER 30%, PR 0%, HER2 positive, Ki-67 30 to 40%  Treatment plan: 1.  Neoadjuvant chemotherapy with Adriamycin and Cytoxan every 3 weeks x4 started 12/24/2020 2. delivery of the baby 3.  Continue neoadjuvant therapy with Taxol Herceptin and Perjeta 4.  Mastectomy with ALND 5.  Adjuvant radiation therapy 6.  Adjuvant antiestrogen therapy with ovarian function  suppression Genetic testing ---------------------------------------------------------------------------------------------------------------------------------- Current Treatment: Cycle 2 AC Chemo Toxicities: Tolerated first cycle of chemotherapy extremely well. Anemia: Microcytic in nature: We will check iron studies and ferritin today.  If she is low in iron then we should consider intravenous iron therapy.  IV iron may need to be done at outpatient infusion clinic at [facility] so that maternal fetal monitoring can be done.  Pregnancy Week: 25 RTC in 3 weeks for cycle 3    No orders of the defined types were placed in this encounter.  The patient has a good understanding of the overall plan. she agrees with it. she will call with any problems that may develop before the next visit here.  Total time spent: 30 mins including face to face time and time spent for planning, charting and coordination of care  Rulon Eisenmenger, MD, MPH 01/13/2021  I, Thana Ates, am acting as scribe for Dr. Nicholas Lose.  I have reviewed the above  documentation for accuracy and completeness, and I agree with the above.

## 2021-01-12 NOTE — Assessment & Plan Note (Signed)
12/08/2020:21-week pregnancy: MRI at [facility] noncontrast revealed right axillary mass 3.7 cm, subpectoral lymph nodes, left supraclavicular lymph node, multiple level 2 and 3 lymph nodes in the right axilla, 2 breast masses 1.1 cm largest size.  Mammogram in [place] revealed 3 small masses at 12:00 1.2 cm, 0.6 cm, 0.9 cm and 5 abnormal axillary lymph nodes, pleomorphic calcifications measuring 10 cm, biopsy revealed grade 2 IDC with necrosis and calcifications ER 30%, PR 0%, HER2 positive, Ki-67 30 to 40% ° °Treatment plan: °1.  Neoadjuvant chemotherapy with Adriamycin and Cytoxan every 3 weeks x4 started 12/24/2020 °2. delivery of the baby °3.  Continue neoadjuvant therapy with Taxol Herceptin and Perjeta °4.  Mastectomy with ALND °5.  Adjuvant radiation therapy °6.  Adjuvant antiestrogen therapy with ovarian function suppression °Genetic testing °---------------------------------------------------------------------------------------------------------------------------------- °Current Treatment: Cycle 2 AC °Chemo Toxicities: ° °Pregnancy Week: °RTC in 3 weeks for cycle 3 ° °

## 2021-01-13 ENCOUNTER — Inpatient Hospital Stay: Payer: BC Managed Care – PPO

## 2021-01-13 ENCOUNTER — Inpatient Hospital Stay: Payer: BC Managed Care – PPO | Admitting: Hematology and Oncology

## 2021-01-13 ENCOUNTER — Other Ambulatory Visit: Payer: Self-pay

## 2021-01-13 VITALS — HR 91

## 2021-01-13 DIAGNOSIS — Z170 Estrogen receptor positive status [ER+]: Secondary | ICD-10-CM | POA: Diagnosis not present

## 2021-01-13 DIAGNOSIS — Z5111 Encounter for antineoplastic chemotherapy: Secondary | ICD-10-CM | POA: Diagnosis not present

## 2021-01-13 DIAGNOSIS — C50411 Malignant neoplasm of upper-outer quadrant of right female breast: Secondary | ICD-10-CM

## 2021-01-13 DIAGNOSIS — D500 Iron deficiency anemia secondary to blood loss (chronic): Secondary | ICD-10-CM

## 2021-01-13 DIAGNOSIS — Z95828 Presence of other vascular implants and grafts: Secondary | ICD-10-CM

## 2021-01-13 LAB — CBC WITH DIFFERENTIAL (CANCER CENTER ONLY)
Abs Immature Granulocytes: 0.23 10*3/uL — ABNORMAL HIGH (ref 0.00–0.07)
Basophils Absolute: 0 10*3/uL (ref 0.0–0.1)
Basophils Relative: 0 %
Eosinophils Absolute: 0 10*3/uL (ref 0.0–0.5)
Eosinophils Relative: 0 %
HCT: 28.7 % — ABNORMAL LOW (ref 36.0–46.0)
Hemoglobin: 8.9 g/dL — ABNORMAL LOW (ref 12.0–15.0)
Immature Granulocytes: 4 %
Lymphocytes Relative: 11 %
Lymphs Abs: 0.7 10*3/uL (ref 0.7–4.0)
MCH: 23.4 pg — ABNORMAL LOW (ref 26.0–34.0)
MCHC: 31 g/dL (ref 30.0–36.0)
MCV: 75.3 fL — ABNORMAL LOW (ref 80.0–100.0)
Monocytes Absolute: 0.6 10*3/uL (ref 0.1–1.0)
Monocytes Relative: 11 %
Neutro Abs: 4.3 10*3/uL (ref 1.7–7.7)
Neutrophils Relative %: 74 %
Platelet Count: 427 10*3/uL — ABNORMAL HIGH (ref 150–400)
RBC: 3.81 MIL/uL — ABNORMAL LOW (ref 3.87–5.11)
RDW: 17.1 % — ABNORMAL HIGH (ref 11.5–15.5)
WBC Count: 5.8 10*3/uL (ref 4.0–10.5)
nRBC: 0 % (ref 0.0–0.2)

## 2021-01-13 LAB — CMP (CANCER CENTER ONLY)
ALT: 19 U/L (ref 0–44)
AST: 14 U/L — ABNORMAL LOW (ref 15–41)
Albumin: 3.3 g/dL — ABNORMAL LOW (ref 3.5–5.0)
Alkaline Phosphatase: 123 U/L (ref 38–126)
Anion gap: 8 (ref 5–15)
BUN: 6 mg/dL (ref 6–20)
CO2: 21 mmol/L — ABNORMAL LOW (ref 22–32)
Calcium: 8.8 mg/dL — ABNORMAL LOW (ref 8.9–10.3)
Chloride: 106 mmol/L (ref 98–111)
Creatinine: 0.47 mg/dL (ref 0.44–1.00)
GFR, Estimated: >60 mL/min (ref >60)
Glucose, Bld: 115 mg/dL — ABNORMAL HIGH (ref 70–99)
Potassium: 3.4 mmol/L — ABNORMAL LOW (ref 3.5–5.1)
Sodium: 135 mmol/L (ref 135–145)
Total Bilirubin: 0.2 mg/dL — ABNORMAL LOW (ref 0.3–1.2)
Total Protein: 6.5 g/dL (ref 6.5–8.1)

## 2021-01-13 LAB — IRON AND IRON BINDING CAPACITY (CC-WL,HP ONLY)
Iron: 26 ug/dL — ABNORMAL LOW (ref 28–170)
Saturation Ratios: 6 % — ABNORMAL LOW (ref 10.4–31.8)
TIBC: 452 ug/dL — ABNORMAL HIGH (ref 250–450)
UIBC: 426 ug/dL (ref 148–442)

## 2021-01-13 MED ORDER — SODIUM CHLORIDE 0.9 % IV SOLN
600.0000 mg/m2 | Freq: Once | INTRAVENOUS | Status: AC
  Administered 2021-01-13: 13:00:00 1440 mg via INTRAVENOUS
  Filled 2021-01-13: qty 72

## 2021-01-13 MED ORDER — SODIUM CHLORIDE 0.9 % IV SOLN
150.0000 mg | Freq: Once | INTRAVENOUS | Status: AC
  Administered 2021-01-13: 12:00:00 150 mg via INTRAVENOUS
  Filled 2021-01-13: qty 150

## 2021-01-13 MED ORDER — SODIUM CHLORIDE 0.9 % IV SOLN
10.0000 mg | Freq: Once | INTRAVENOUS | Status: AC
  Administered 2021-01-13: 12:00:00 10 mg via INTRAVENOUS
  Filled 2021-01-13: qty 10

## 2021-01-13 MED ORDER — SODIUM CHLORIDE 0.9% FLUSH
10.0000 mL | INTRAVENOUS | Status: DC | PRN
  Administered 2021-01-13: 14:00:00 10 mL

## 2021-01-13 MED ORDER — DOXORUBICIN HCL CHEMO IV INJECTION 2 MG/ML
60.0000 mg/m2 | Freq: Once | INTRAVENOUS | Status: AC
  Administered 2021-01-13: 13:00:00 144 mg via INTRAVENOUS
  Filled 2021-01-13: qty 72

## 2021-01-13 MED ORDER — HEPARIN SOD (PORK) LOCK FLUSH 100 UNIT/ML IV SOLN
500.0000 [IU] | Freq: Once | INTRAVENOUS | Status: AC | PRN
  Administered 2021-01-13: 14:00:00 500 [IU]

## 2021-01-13 MED ORDER — PALONOSETRON HCL INJECTION 0.25 MG/5ML
0.2500 mg | Freq: Once | INTRAVENOUS | Status: AC
  Administered 2021-01-13: 12:00:00 0.25 mg via INTRAVENOUS
  Filled 2021-01-13: qty 5

## 2021-01-13 MED ORDER — SODIUM CHLORIDE 0.9% FLUSH
10.0000 mL | Freq: Once | INTRAVENOUS | Status: AC
  Administered 2021-01-13: 10:00:00 10 mL

## 2021-01-13 MED ORDER — SODIUM CHLORIDE 0.9 % IV SOLN: Freq: Once | INTRAVENOUS | Status: AC

## 2021-01-13 NOTE — Patient Instructions (Signed)
[facility] ONCOLOGY  Discharge Instructions: Thank you for choosing [facility] to provide your oncology and hematology care.   If you have a lab appointment with the [facility], please go directly to the [facility] and check in at the registration area.   Wear comfortable clothing and clothing appropriate for easy access to any Portacath or PICC line.   We strive to give you quality time with your provider. You may need to reschedule your appointment if you arrive late (15 or more minutes).  Arriving late affects you and other patients whose appointments are after yours.  Also, if you miss three or more appointments without notifying the office, you may be dismissed from the clinic at the providers discretion.      For prescription refill requests, have your pharmacy contact our office and allow 72 hours for refills to be completed.    Today you received the following chemotherapy and/or immunotherapy agents: Doxorubicin (Adriamycin) and Cyclophosphamide (Cytoxan).     To help prevent nausea and vomiting after your treatment, we encourage you to take your nausea medication as directed.  BELOW ARE SYMPTOMS THAT SHOULD BE REPORTED IMMEDIATELY: *FEVER GREATER THAN 100.4 F (38 C) OR HIGHER *CHILLS OR SWEATING *NAUSEA AND VOMITING THAT IS NOT CONTROLLED WITH YOUR NAUSEA MEDICATION *UNUSUAL SHORTNESS OF BREATH *UNUSUAL BRUISING OR BLEEDING *URINARY PROBLEMS (pain or burning when urinating, or frequent urination) *BOWEL PROBLEMS (unusual diarrhea, constipation, pain near the anus) TENDERNESS IN MOUTH AND THROAT WITH OR WITHOUT PRESENCE OF ULCERS (sore throat, sores in mouth, or a toothache) UNUSUAL RASH, SWELLING OR PAIN  UNUSUAL VAGINAL DISCHARGE OR ITCHING   Items with * indicate a potential emergency and should be followed up as soon as possible or go to the Emergency Department if any problems should occur.  Please show the CHEMOTHERAPY ALERT  CARD or IMMUNOTHERAPY ALERT CARD at check-in to the Emergency Department and triage nurse.  Should you have questions after your visit or need to cancel or reschedule your appointment, please contact [facility]  Dept: [phone]  and follow the prompts.  Office hours are 8:00 a.m. to 4:30 p.m. Monday - Friday. Please note that voicemails left after 4:00 p.m. may not be returned until the following business day.  We are closed weekends and major holidays. You have access to a nurse at all times for urgent questions. Please call the main number to the clinic Dept: [phone] and follow the prompts.   For any non-urgent questions, you may also contact your provider using MyChart. We now offer e-Visits for anyone 33 and older to request care online for non-urgent symptoms. For details visit [email or web address].   Also download the MyChart app! Go to the app store, search "MyChart", open the app, select [facility], and log in with your MyChart username and password.  Due to Covid, a mask is required upon entering the hospital/clinic. If you do not have a mask, one will be given to you upon arrival. For doctor visits, patients may have 1 support person aged 24 or older with them. For treatment visits, patients cannot have anyone with them due to current Covid guidelines and our immunocompromised population.

## 2021-01-13 NOTE — Progress Notes (Signed)
Called and left VM for [facility] short stay [phone]) to get pt scheduled for IV Iron with fetal monitoring

## 2021-01-13 NOTE — Progress Notes (Signed)
Per Dr. Lindi Adie and Carolynne Edouard, [facility] - okay to stop emend infusion and remove as future premedication. Patient does not additional coverage at this time.

## 2021-01-14 ENCOUNTER — Other Ambulatory Visit: Payer: Self-pay | Admitting: Hematology and Oncology

## 2021-01-21 ENCOUNTER — Other Ambulatory Visit: Payer: Self-pay

## 2021-01-23 NOTE — L&D Delivery Note (Signed)
Delivery Note At 12:02 PM a viable and healthy female was delivered via Vaginal, Spontaneous (Presentation: Right Occiput Anterior).  APGAR: 8, 9; weight 5 lb 5 oz (2410 g).   Placenta status: Pathology;Spontaneous, Intact.  To path Cord: 3 vessels with the following complications: None.  Cord pH: n/a  Anesthesia: Epidural Episiotomy: None Lacerations:  none Suture Repair:  n/a Est. Blood Loss (mL):  200  Mom to postpartum.  Baby to NICU.  Sollie Vultaggio A Deforest Maiden 03/16/2021, 12:30 PM

## 2021-01-25 ENCOUNTER — Encounter: Payer: Self-pay | Admitting: Adult Health

## 2021-01-25 ENCOUNTER — Telehealth: Payer: Self-pay | Admitting: *Deleted

## 2021-01-25 NOTE — Progress Notes (Signed)
Returned call to patient from voicemail left regarding applying for financial assistance.  Discussed one-time $1000 Radio broadcast assistant to assist with personal expenses while going through treatment. Advised what is needed to apply. She will bring on 1/12 at next appointment.  Also reviewing for available copay assistance.  She has my card for any other financial questions or concerns.

## 2021-01-25 NOTE — Telephone Encounter (Signed)
"  Evelyn Marshall [phone]).  What is the status of form dropped off a few weeks ago?  I also have a claim number to add.  One [place] number is [id number]."  Claim number added to [employer] Disability tracker sheet.   Advised effective 01/20/2021, form awaiting MD review and signature. Currently no further questions or needs.

## 2021-01-26 ENCOUNTER — Telehealth: Payer: Self-pay

## 2021-01-26 ENCOUNTER — Inpatient Hospital Stay (HOSPITAL_COMMUNITY)
Admission: AD | Admit: 2021-01-26 | Discharge: 2021-01-27 | Disposition: A | Discharge status: Home / Self Care | Payer: BC Managed Care – PPO | Attending: Obstetrics and Gynecology | Admitting: Obstetrics and Gynecology

## 2021-01-26 ENCOUNTER — Encounter (HOSPITAL_COMMUNITY): Payer: Self-pay | Admitting: Obstetrics and Gynecology

## 2021-01-26 ENCOUNTER — Other Ambulatory Visit: Payer: Self-pay

## 2021-01-26 DIAGNOSIS — O212 Late vomiting of pregnancy: Secondary | ICD-10-CM | POA: Diagnosis present

## 2021-01-26 DIAGNOSIS — O09292 Supervision of pregnancy with other poor reproductive or obstetric history, second trimester: Secondary | ICD-10-CM | POA: Diagnosis not present

## 2021-01-26 DIAGNOSIS — Z79810 Long term (current) use of selective estrogen receptor modulators (SERMs): Secondary | ICD-10-CM | POA: Insufficient documentation

## 2021-01-26 DIAGNOSIS — Z3A27 27 weeks gestation of pregnancy: Secondary | ICD-10-CM | POA: Diagnosis not present

## 2021-01-26 DIAGNOSIS — R809 Proteinuria, unspecified: Secondary | ICD-10-CM | POA: Insufficient documentation

## 2021-01-26 DIAGNOSIS — C50911 Malignant neoplasm of unspecified site of right female breast: Secondary | ICD-10-CM | POA: Diagnosis not present

## 2021-01-26 DIAGNOSIS — O10912 Unspecified pre-existing hypertension complicating pregnancy, second trimester: Secondary | ICD-10-CM | POA: Diagnosis not present

## 2021-01-26 DIAGNOSIS — O9A112 Malignant neoplasm complicating pregnancy, second trimester: Secondary | ICD-10-CM | POA: Diagnosis not present

## 2021-01-26 DIAGNOSIS — R112 Nausea with vomiting, unspecified: Secondary | ICD-10-CM

## 2021-01-26 DIAGNOSIS — T451X5A Adverse effect of antineoplastic and immunosuppressive drugs, initial encounter: Secondary | ICD-10-CM

## 2021-01-26 DIAGNOSIS — O10012 Pre-existing essential hypertension complicating pregnancy, second trimester: Secondary | ICD-10-CM

## 2021-01-26 DIAGNOSIS — O1212 Gestational proteinuria, second trimester: Secondary | ICD-10-CM

## 2021-01-26 LAB — URINALYSIS, ROUTINE W REFLEX MICROSCOPIC
Bilirubin Urine: NEGATIVE
Glucose, UA: NEGATIVE mg/dL
Hgb urine dipstick: NEGATIVE
Ketones, ur: 80 mg/dL — AB
Leukocytes,Ua: NEGATIVE
Nitrite: NEGATIVE
Protein, ur: 100 mg/dL — AB
Specific Gravity, Urine: 1.033 — ABNORMAL HIGH (ref 1.005–1.030)
pH: 5 (ref 5.0–8.0)

## 2021-01-26 LAB — CBC
HCT: 29.8 % — ABNORMAL LOW (ref 36.0–46.0)
Hemoglobin: 9.6 g/dL — ABNORMAL LOW (ref 12.0–15.0)
MCH: 24.6 pg — ABNORMAL LOW (ref 26.0–34.0)
MCHC: 32.2 g/dL (ref 30.0–36.0)
MCV: 76.2 fL — ABNORMAL LOW (ref 80.0–100.0)
Platelets: 281 10*3/uL (ref 150–400)
RBC: 3.91 MIL/uL (ref 3.87–5.11)
RDW: 18.2 % — ABNORMAL HIGH (ref 11.5–15.5)
WBC: 1.4 10*3/uL — CL (ref 4.0–10.5)
nRBC: 0 % (ref 0.0–0.2)

## 2021-01-26 LAB — COMPREHENSIVE METABOLIC PANEL
ALT: 33 U/L (ref 0–44)
AST: 36 U/L (ref 15–41)
Albumin: 2.8 g/dL — ABNORMAL LOW (ref 3.5–5.0)
Alkaline Phosphatase: 137 U/L — ABNORMAL HIGH (ref 38–126)
Anion gap: 10 (ref 5–15)
BUN: 5 mg/dL — ABNORMAL LOW (ref 6–20)
CO2: 21 mmol/L — ABNORMAL LOW (ref 22–32)
Calcium: 8.7 mg/dL — ABNORMAL LOW (ref 8.9–10.3)
Chloride: 103 mmol/L (ref 98–111)
Creatinine, Ser: 0.53 mg/dL (ref 0.44–1.00)
GFR, Estimated: >60 mL/min (ref >60)
Glucose, Bld: 86 mg/dL (ref 70–99)
Potassium: 3.6 mmol/L (ref 3.5–5.1)
Sodium: 134 mmol/L — ABNORMAL LOW (ref 135–145)
Total Bilirubin: 0.5 mg/dL (ref 0.3–1.2)
Total Protein: 6.7 g/dL (ref 6.5–8.1)

## 2021-01-26 MED ORDER — LACTATED RINGERS IV SOLN: INTRAVENOUS | Status: DC

## 2021-01-26 MED ORDER — LACTATED RINGERS IV BOLUS
1000.0000 mL | Freq: Once | INTRAVENOUS | Status: AC
  Administered 2021-01-26: 1000 mL via INTRAVENOUS

## 2021-01-26 MED ORDER — SODIUM CHLORIDE 0.9 % IV SOLN
8.0000 mg | Freq: Once | INTRAVENOUS | Status: AC
  Administered 2021-01-26: 8 mg via INTRAVENOUS
  Filled 2021-01-26: qty 4

## 2021-01-26 NOTE — MAU Note (Signed)
Hasn't been able to keep anything down, not food or fluids, since last night.  Doesn't know if it is the pregnancy or chemo. Last chemo was 12/22,  gets them every 3 wks.

## 2021-01-26 NOTE — MAU Note (Signed)
CRITICAL VALUE STICKER  CRITICAL VALUE: wbc 1.4  RECEIVER (on-site recipient of call): Elray Mcgregor, [place] NOTIFIED: 01/26/2021 at 2011  MESSENGER (representative from lab): Jenny Reichmann, lab  MD NOTIFIED: Servando Salina, MD  TIME OF NOTIFICATION: 01/26/2021 at 2015  RESPONSE:  No new orders at this time

## 2021-01-26 NOTE — Progress Notes (Signed)
Called by RN regarding critical value of 1.4  after speaking with Dr Burr Medico regarding  Options for antiemetics for this pt. Suggested low dose compazine. Await return call from Dr Burr Medico the on call heme/onc physician regarding wbc result

## 2021-01-26 NOTE — Telephone Encounter (Signed)
Notified Patient of completion of Disability Form (Attending Physician Statement). Fax transmission confirmation received and copy of form placed at registation desk for pick-up as requested.

## 2021-01-26 NOTE — Progress Notes (Signed)
CBC Latest Ref Rng & Units 01/26/2021 01/13/2021 12/31/2020  WBC 4.0 - 10.5 K/uL 1.4(LL) 5.8 3.0(L)  Hemoglobin 12.0 - 15.0 g/dL 9.6(L) 8.9(L) 9.0(L)  Hematocrit 36.0 - 46.0 % 29.8(L) 28.7(L) 28.1(L)  Platelets 150 - 400 K/uL 281 427(H) 371    CMP Latest Ref Rng & Units 01/26/2021 01/13/2021 12/31/2020  Glucose 70 - 99 mg/dL 86 115(H) 75  BUN 6 - 20 mg/dL 5(L) 6 8  Creatinine 0.44 - 1.00 mg/dL 0.53 0.47 0.59  Sodium 135 - 145 mmol/L 134(L) 135 133(L)  Potassium 3.5 - 5.1 mmol/L 3.6 3.4(L) 4.3  Chloride 98 - 111 mmol/L 103 106 103  CO2 22 - 32 mmol/L 21(L) 21(L) 19(L)  Calcium 8.9 - 10.3 mg/dL 8.7(L) 8.8(L) 9.1  Total Protein 6.5 - 8.1 g/dL 6.7 6.5 7.2  Total Bilirubin 0.3 - 1.2 mg/dL 0.5 0.2(L) 0.4  Alkaline Phos 38 - 126 U/L 137(H) 123 132(H)  AST 15 - 41 U/L 36 14(L) 22  ALT 0 - 44 U/L 33 19 21    Labs not c/w preeclampsia Disc neutropenia with Dr Burr Medico  Would not admit as pt has no fever and Is not septic Able to tolerate crackers, liquid Tracing reviewed after  earlier decel x 2 No ctx. (+) accel  D/c home F/u heme/onc tomorrow  Keep scheduled OB appts

## 2021-01-26 NOTE — MAU Provider Note (Signed)
History     Chief Complaint  Patient presents with   Emesis   Nausea  36 yo G5P3013  BF @ 62 5/[redacted] week gestation presents with c/o n/v since 2 am. Pt's hx is notable for Stage IV right breast cancer who is undergoing chemotherapy( adriamycin and cytoxan) with last dose 12/22. Pt denies diarrhea, abdominal pain, h/a or visual changes. She known to have chronic HTN and has underlying proteinuria from onset of the pregnancy. Pt has had hx pre-eclampsia in prior pregnancies. Pt denies eating any food out of the ordinary. Per Pt she took Zofran at home and brought it back up each time.    OB History     Gravida  5   Para  3   Term  3   Preterm  0   AB  1   Living  3      SAB  0   IAB      Ectopic      Multiple  0   Live Births  3           Past Medical History:  Diagnosis Date   Abnormal Pap smear 2012   colpo result benign   Cancer (HCC)    Chlamydia    Family history of breast cancer 12/15/2020   History of pre-eclampsia    HPV (human papilloma virus) infection    Hyperemesis    Hypokalemia    Pregnancy induced hypertension    Ptyalism    Vaginal Pap smear, abnormal     Past Surgical History:  Procedure Laterality Date   COLPOSCOPY     IR IMAGING GUIDED PORT INSERTION  12/24/2020   WISDOM TOOTH EXTRACTION      Family History  Problem Relation Age of Onset   Heart disease Father    Hypertension Father    Stroke Father    Breast cancer Paternal Grandmother        dx 70s   Stroke Paternal Grandmother    Breast cancer Cousin 36       maternal female cousin    Social History   Tobacco Use   Smoking status: Never   Smokeless tobacco: Never  Vaping Use   Vaping Use: Never used  Substance Use Topics   Alcohol use: No   Drug use: No    Allergies: No Known Allergies  Medications Prior to Admission  Medication Sig Dispense Refill Last Dose   aspirin EC 81 MG tablet Take 81 mg by mouth daily. Swallow whole.   Past Month   dexamethasone  (DECADRON) 4 MG tablet Take 1 tablet (4 mg total) by mouth daily. Take 1 tablet day after chemo and 1 tablet 2 days after chemo with food 8 tablet 1 Past Week   Iron-FA-B Cmp-C-Biot-Probiotic (FUSION PLUS) CAPS Take 1 capsule by mouth daily.   01/26/2021   lidocaine-prilocaine (EMLA) cream Apply to affected area once 30 g 3 Past Week   ondansetron (ZOFRAN) 8 MG tablet Take 1 tablet (8 mg total) by mouth 2 (two) times daily as needed. Start on the third day after chemotherapy. 30 tablet 1 01/26/2021   Doxylamine-Pyridoxine 10-10 MG TBEC Take 1 tablet by mouth 3 (three) times daily as needed (nausea).        Physical Exam   Blood pressure (!) 152/96, pulse (!) 102, temperature 98.7 F (37.1 C), temperature source Oral, resp. rate 18, weight 129 kg, last menstrual period 07/16/2020, SpO2 100 %, unknown if currently breastfeeding.  BP Marland Kitchen)  152/96    Pulse (!) 102    Temp 98.7 F (37.1 C) (Oral)    Resp 18    Wt 129 kg    LMP 07/16/2020    SpO2 100%    BMI 48.82 kg/m  Lungs: clear to auscultation bilaterally Heart:  tachycardia grade 2/6 SEM Abdomen:  gravid soft Extremities: no edema, redness or tenderness in the calves or thighs Skin: Skin color, texture, turgor normal. No rashes or lesions   Tracing: baseline 145 to 150 good variability No ctx ED Course  Nausea & vomiting in pregnancy Stage IV right breast cancer undergoing chemotherapy Chronic HTN with known proteinuria Hx preeclampsia IUP @ 27 5/7 week  RH negative P) IVF( 2L LR), IV Zofran( 8 mg). CBC. CMP ( to check for toxemia). PCR( urine) will not be helpful as baseline elevated Will contact on call heme/onc regarding preference of anti emetic as pt declines rectal suppository with phenergan MDM   Marvene Staff, MD 7:35 PM 01/26/2021

## 2021-01-27 ENCOUNTER — Telehealth: Payer: Self-pay | Admitting: *Deleted

## 2021-01-27 NOTE — MAU Note (Signed)
Pt signed physical copy of AVS d/t E-signature malfunction in room. Signature place in medical records bin.

## 2021-01-27 NOTE — Telephone Encounter (Signed)
Pt called requesting dx code for hair prothesis. Returned pt call, left vm asking if her insurance needs a code or do they need the code with an order? Request return call. Contact information provided.

## 2021-01-28 ENCOUNTER — Telehealth: Payer: Self-pay

## 2021-01-28 NOTE — Telephone Encounter (Signed)
Patient reports she recently went to the hospital for nausea/vomiting and had concerns about her WBC of 1.4. This RN advised her that her [place] is still within an appropriate range at 4.3 and this is a typical WBC with the chemo treatment she is getting. RN advise patient to be mindful of who she is around and to report any chills/fevers or other signs of infection immediately. Patient verbalized understanding.

## 2021-02-02 ENCOUNTER — Other Ambulatory Visit: Payer: Self-pay

## 2021-02-02 ENCOUNTER — Ambulatory Visit: Payer: BC Managed Care – PPO | Attending: Maternal & Fetal Medicine

## 2021-02-02 ENCOUNTER — Ambulatory Visit: Payer: BC Managed Care – PPO | Admitting: *Deleted

## 2021-02-02 ENCOUNTER — Encounter: Payer: Self-pay | Admitting: *Deleted

## 2021-02-02 ENCOUNTER — Other Ambulatory Visit: Payer: Self-pay | Admitting: *Deleted

## 2021-02-02 VITALS — BP 140/77 | HR 95

## 2021-02-02 DIAGNOSIS — Z3A28 28 weeks gestation of pregnancy: Secondary | ICD-10-CM | POA: Diagnosis not present

## 2021-02-02 DIAGNOSIS — C50919 Malignant neoplasm of unspecified site of unspecified female breast: Secondary | ICD-10-CM | POA: Insufficient documentation

## 2021-02-02 DIAGNOSIS — O10913 Unspecified pre-existing hypertension complicating pregnancy, third trimester: Secondary | ICD-10-CM | POA: Diagnosis present

## 2021-02-02 DIAGNOSIS — O10012 Pre-existing essential hypertension complicating pregnancy, second trimester: Secondary | ICD-10-CM | POA: Diagnosis not present

## 2021-02-02 DIAGNOSIS — O99212 Obesity complicating pregnancy, second trimester: Secondary | ICD-10-CM | POA: Diagnosis present

## 2021-02-02 DIAGNOSIS — E669 Obesity, unspecified: Secondary | ICD-10-CM | POA: Diagnosis not present

## 2021-02-02 DIAGNOSIS — O10912 Unspecified pre-existing hypertension complicating pregnancy, second trimester: Secondary | ICD-10-CM | POA: Diagnosis not present

## 2021-02-02 DIAGNOSIS — D500 Iron deficiency anemia secondary to blood loss (chronic): Secondary | ICD-10-CM

## 2021-02-02 DIAGNOSIS — Z170 Estrogen receptor positive status [ER+]: Secondary | ICD-10-CM

## 2021-02-02 IMAGING — US US MFM OB FOLLOW-UP
1 series · 13 of 28 positions shown · non-contrast
Comparison: none

[Series 1: us mfm ob follow-up · 13 of 35 slices shown]
[im 2/35]
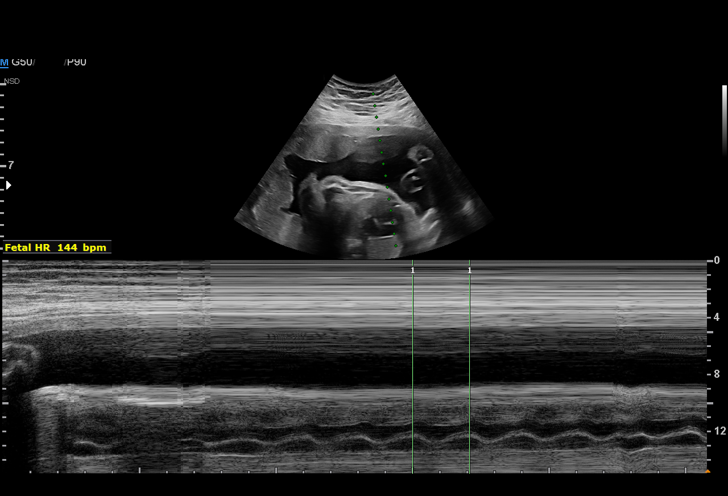
[im 4/35]
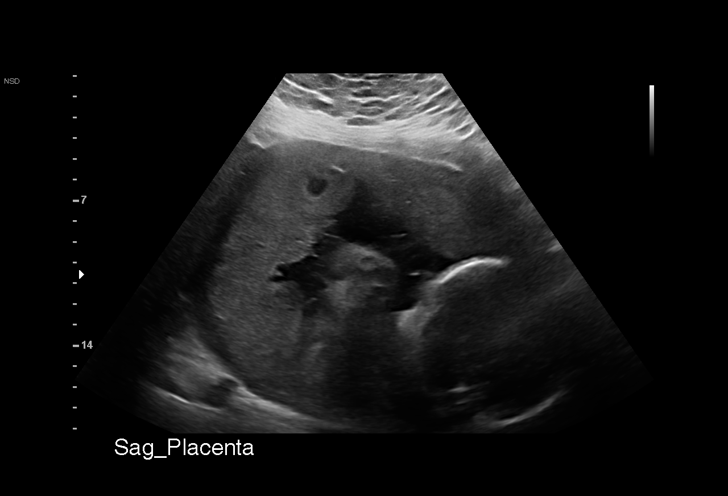
[im 7/35]
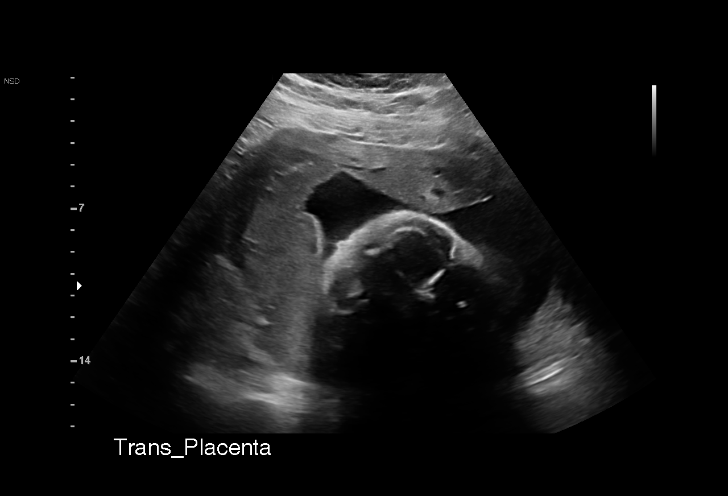
[im 9/35]
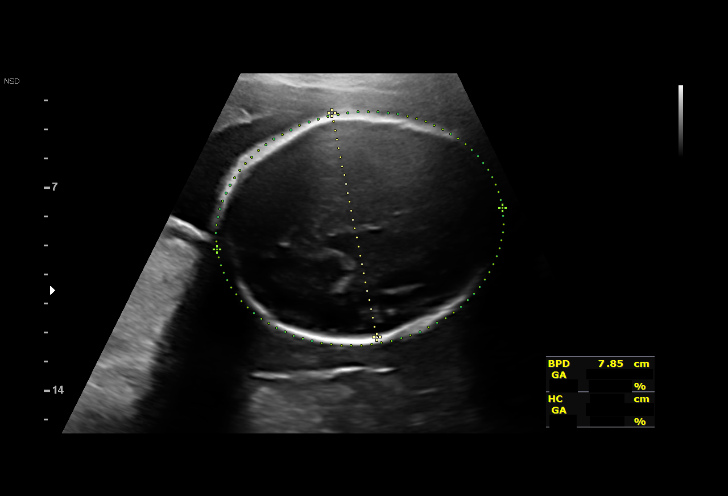
[im 12/35]
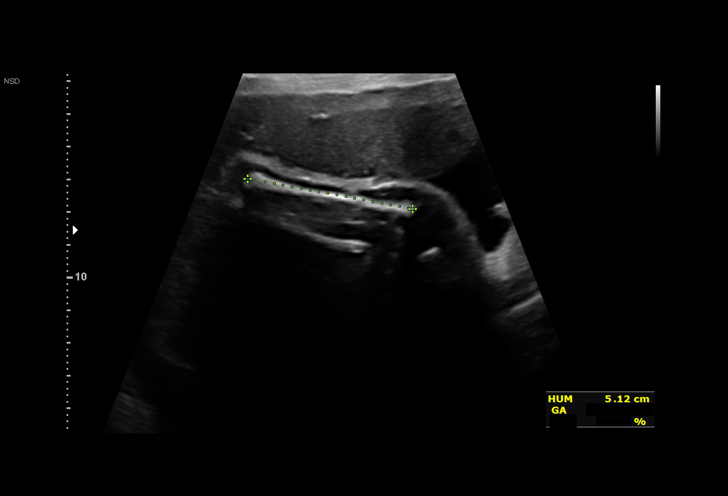
[im 14/35]
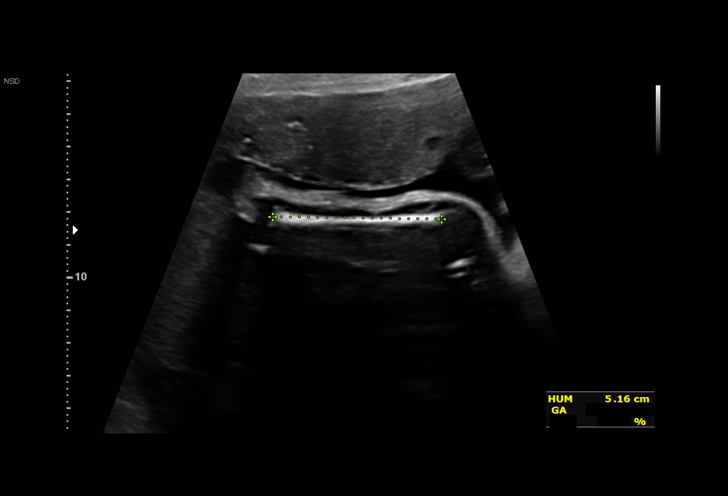
[im 18/35]
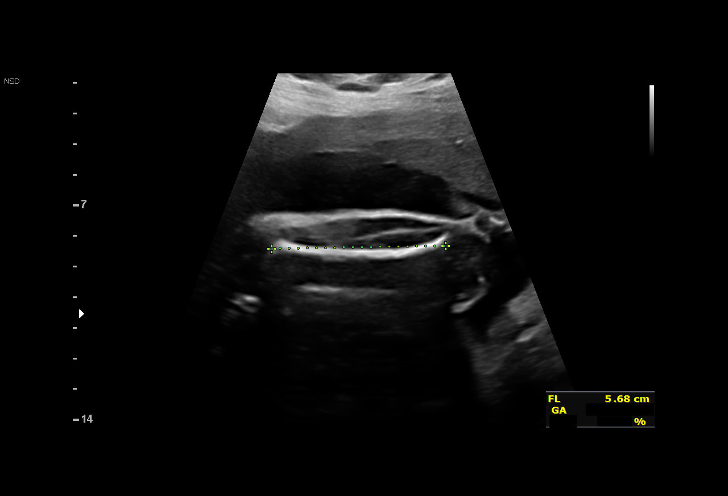
[im 21/35]
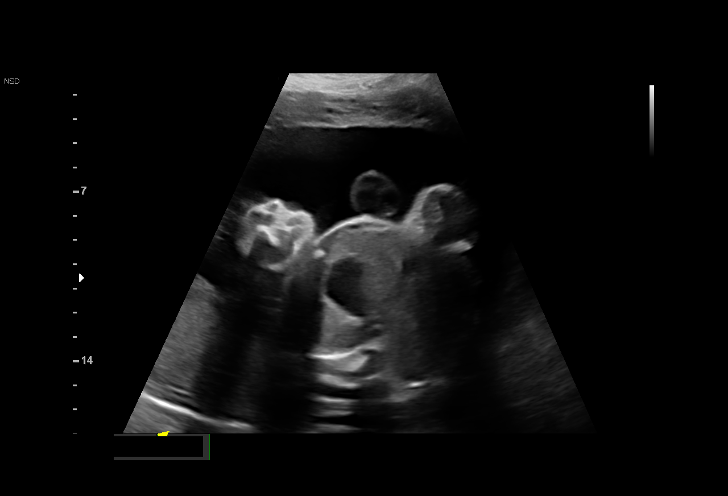
[im 23/35]
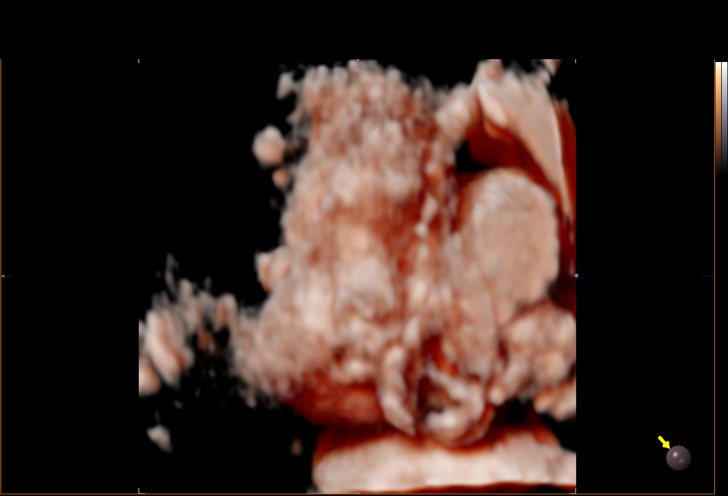
[im 26/35]
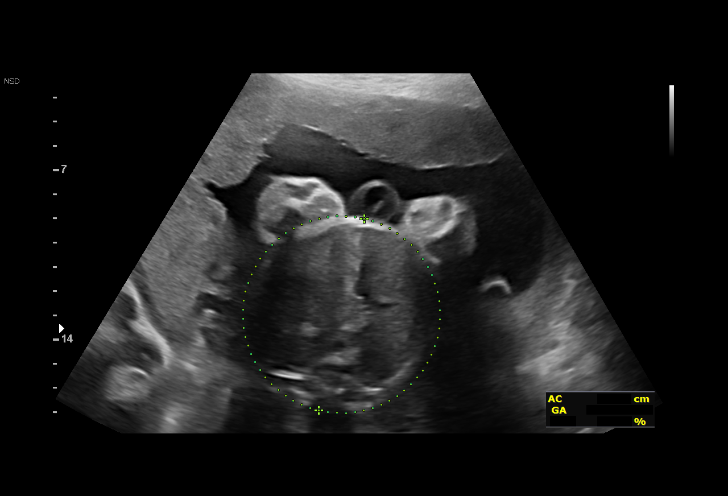
[im 28/35]
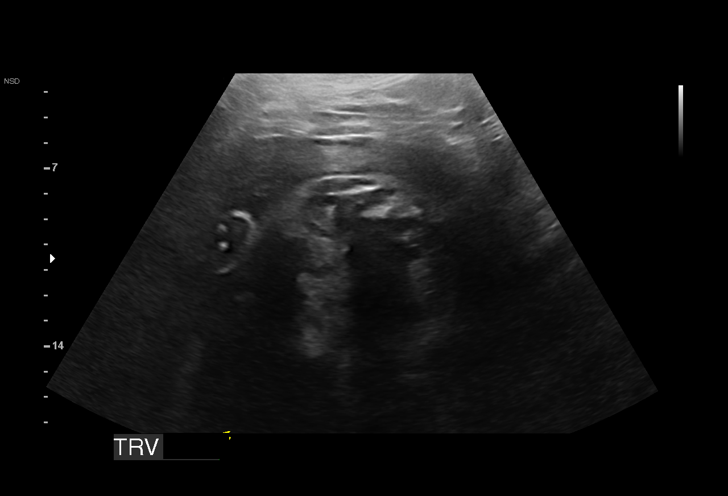
[im 31/35]
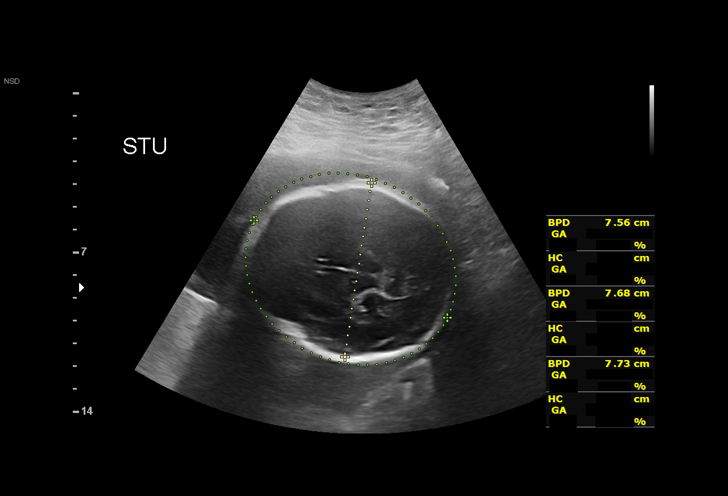
[im 33/35]
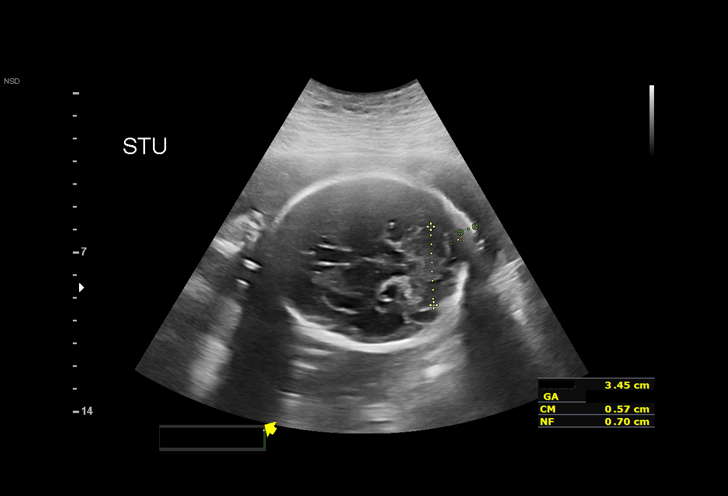

[13 of 28 positions shown; findings below may reference images not displayed]

OB/GYN

                                                      IHEMAGUBA

Indications

 Hypertension - Chronic/Pre-existing (dx        [V2]
 [DATE])
 Medical complication of pregnancy (Breast      [V2]
 Cancer in patient Dx [DATE])
 28 weeks gestation of pregnancy
 Obesity complicating pregnancy, second         [V2]
 trimester (pregravid BMI 45)
 Rh negative state in antepartum (Anti-D - too  [V2]
 weak to titer)
 Neg AFP; NIPS - no result
Fetal Evaluation

 Num Of Fetuses:         1
 Fetal Heart Rate(bpm):  144
 Cardiac Activity:       Observed
 Presentation:           Cephalic
 Placenta:               Posterior
 P. Cord Insertion:      Previously Visualized

 Amniotic Fluid
 AFI FV:      Within normal limits

 AFI Sum(cm)     %Tile       Largest Pocket(cm)
 17.85           68
 RUQ(cm)       RLQ(cm)       LUQ(cm)        LLQ(cm)

Biometry

 BPD:      77.5  mm     G. Age:  31w 1d         95  %    CI:        78.64   %    70 - 86
                                                         FL/HC:      20.5   %    19.6 -
 HC:      276.4  mm     G. Age:  30w 2d         64  %    HC/AC:      1.09        0.99 -
 AC:      254.2  mm     G. Age:  29w 4d         70  %    FL/BPD:     73.2   %    71 - 87
 FL:       56.7  mm     G. Age:  29w 5d         66  %    FL/AC:      22.3   %    20 - 24
 HUM:      51.3  mm     G. Age:  30w 0d         71  %

 LV:        5.7  mm

 Est. FW:    [V2]  gm      3 lb 4 oz     78  %
OB History

 Gravidity:    5         Term:   3        Prem:   0        SAB:   0
 TOP:          1       Ectopic:  0        Living: 3
Gestational Age

 LMP:           28w 5d        Date:  [DATE]                 EDD:   [DATE]
 U/S Today:     30w 1d                                        EDD:   [DATE]
 Best:          28w 5d     Det. By:  LMP  ([DATE])          EDD:   [DATE]
Anatomy

 Cranium:               Appears normal         LVOT:                   Previously seen
 Cavum:                 Previously seen        Aortic Arch:            Previously seen
 Ventricles:            Appears normal         Ductal Arch:            Previously seen
 Choroid Plexus:        Previously seen        Diaphragm:              Appears normal
 Cerebellum:            Previously seen        Stomach:                Appears normal, left
                                                                       sided
 Posterior Fossa:       Previously seen        Abdomen:                Appears normal
 Nuchal Fold:           Not applicable (>20    Abdominal Wall:         Previously seen
                        wks GA)
 Face:                  Orbits and profile     Cord Vessels:           Previously seen
                        previously seen
 Lips:                  Previously seen        Kidneys:                Appear normal
 Palate:                Not well visualized    Bladder:                Appears normal
 Thoracic:              Appears normal         Spine:                  Previously seen
 Heart:                 Appears normal         Upper Extremities:      Previously seen
                        (4CH, axis, and
                        situs)
 RVOT:                  Previously seen        Lower Extremities:      Previously seen

 Other:  Male gender, Heels, 5th digit, Lenses, VC, 3VV and 3VTV previously
         seen. Technically difficult due to maternal habitus and fetal position.
Cervix Uterus Adnexa

 Cervix
 Not visualized (advanced GA >[V2])
Impression

 Chronic hypertension.  Well-controlled without
 antihypertensives.
 Patient has a new diagnosis of invasive ductal carcinoma and
 is undergoing chemotherapy.  She is tolerating chemotherapy
 well.

 Fetal growth is appropriate for gestational age .Amniotic fluid
 is normal and good fetal activity is seen .
 I reassured the patient of the findings.
Recommendations

 -An appointment was made for her to return in 3 weeks for
 fetal growth assessment and BPP.
 -Weekly BPP from her next visit till delivery.
 -[HOSPITAL] consultation at her next visit
 to address chemotherapy in pregnancy and timing of delivery.
                 IHEMAGUBA

## 2021-02-02 MED FILL — Dexamethasone Sodium Phosphate Inj 100 MG/10ML: INTRAMUSCULAR | Qty: 1 | Status: AC

## 2021-02-02 NOTE — Assessment & Plan Note (Signed)
12/08/2020:21-week pregnancy: MRI at [facility] noncontrast revealed right axillary mass 3.7 cm, subpectoral lymph nodes, left supraclavicular lymph node, multiple level 2 and 3 lymph nodes in the right axilla, 2 breast masses 1.1 cm largest size. 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 Neoadjuvant chemotherapy with Adriamycin and Cytoxan every 3 weeks x4 started 12/24/2020 2. delivery of the baby 3. Continue neoadjuvant therapy with Taxol Herceptin and Perjeta 4. Mastectomy with ALND 5. Adjuvant radiation therapy 6. Adjuvant antiestrogen therapy with ovarian function suppression Genetic testing ---------------------------------------------------------------------------------------------------------------------------------- Current Treatment: Cycle 3 AC Chemo Toxicities: Tolerated first cycle of chemotherapy extremely well. 1. Anemia: Microcytic in nature: We will check iron studies and ferritin today.  If she is low in iron then we should consider intravenous iron therapy.  IV iron may need to be done at outpatient infusion clinic at [facility] so that maternal fetal monitoring can be done.  Pregnancy Week: 25 RTC in 3 weeks for cycle 4

## 2021-02-03 ENCOUNTER — Inpatient Hospital Stay: Payer: BC Managed Care – PPO

## 2021-02-03 ENCOUNTER — Inpatient Hospital Stay: Payer: BC Managed Care – PPO | Admitting: Hematology and Oncology

## 2021-02-03 ENCOUNTER — Encounter: Payer: Self-pay | Admitting: Hematology and Oncology

## 2021-02-03 ENCOUNTER — Inpatient Hospital Stay: Payer: BC Managed Care – PPO | Attending: Hematology and Oncology

## 2021-02-03 VITALS — HR 94

## 2021-02-03 DIAGNOSIS — Z95828 Presence of other vascular implants and grafts: Secondary | ICD-10-CM

## 2021-02-03 DIAGNOSIS — Z170 Estrogen receptor positive status [ER+]: Secondary | ICD-10-CM

## 2021-02-03 DIAGNOSIS — D500 Iron deficiency anemia secondary to blood loss (chronic): Secondary | ICD-10-CM

## 2021-02-03 DIAGNOSIS — C50411 Malignant neoplasm of upper-outer quadrant of right female breast: Secondary | ICD-10-CM

## 2021-02-03 DIAGNOSIS — O99012 Anemia complicating pregnancy, second trimester: Secondary | ICD-10-CM | POA: Diagnosis present

## 2021-02-03 DIAGNOSIS — D509 Iron deficiency anemia, unspecified: Secondary | ICD-10-CM | POA: Diagnosis not present

## 2021-02-03 DIAGNOSIS — Z5111 Encounter for antineoplastic chemotherapy: Secondary | ICD-10-CM | POA: Insufficient documentation

## 2021-02-03 DIAGNOSIS — Z23 Encounter for immunization: Secondary | ICD-10-CM | POA: Insufficient documentation

## 2021-02-03 LAB — CMP (CANCER CENTER ONLY)
ALT: 29 U/L (ref 0–44)
AST: 18 U/L (ref 15–41)
Albumin: 3.2 g/dL — ABNORMAL LOW (ref 3.5–5.0)
Alkaline Phosphatase: 115 U/L (ref 38–126)
Anion gap: 8 (ref 5–15)
BUN: <5 mg/dL — ABNORMAL LOW (ref 6–20)
CO2: 22 mmol/L (ref 22–32)
Calcium: 8.4 mg/dL — ABNORMAL LOW (ref 8.9–10.3)
Chloride: 106 mmol/L (ref 98–111)
Creatinine: 0.45 mg/dL (ref 0.44–1.00)
GFR, Estimated: >60 mL/min (ref >60)
Glucose, Bld: 118 mg/dL — ABNORMAL HIGH (ref 70–99)
Potassium: 3.1 mmol/L — ABNORMAL LOW (ref 3.5–5.1)
Sodium: 136 mmol/L (ref 135–145)
Total Bilirubin: 0.2 mg/dL — ABNORMAL LOW (ref 0.3–1.2)
Total Protein: 6.2 g/dL — ABNORMAL LOW (ref 6.5–8.1)

## 2021-02-03 LAB — CBC WITH DIFFERENTIAL (CANCER CENTER ONLY)
Abs Immature Granulocytes: 0.13 10*3/uL — ABNORMAL HIGH (ref 0.00–0.07)
Basophils Absolute: 0 10*3/uL (ref 0.0–0.1)
Basophils Relative: 0 %
Eosinophils Absolute: 0 10*3/uL (ref 0.0–0.5)
Eosinophils Relative: 0 %
HCT: 26.9 % — ABNORMAL LOW (ref 36.0–46.0)
Hemoglobin: 8.5 g/dL — ABNORMAL LOW (ref 12.0–15.0)
Immature Granulocytes: 2 %
Lymphocytes Relative: 10 %
Lymphs Abs: 0.5 10*3/uL — ABNORMAL LOW (ref 0.7–4.0)
MCH: 23.7 pg — ABNORMAL LOW (ref 26.0–34.0)
MCHC: 31.6 g/dL (ref 30.0–36.0)
MCV: 75.1 fL — ABNORMAL LOW (ref 80.0–100.0)
Monocytes Absolute: 0.6 10*3/uL (ref 0.1–1.0)
Monocytes Relative: 12 %
Neutro Abs: 4.1 10*3/uL (ref 1.7–7.7)
Neutrophils Relative %: 76 %
Platelet Count: 396 10*3/uL (ref 150–400)
RBC: 3.58 MIL/uL — ABNORMAL LOW (ref 3.87–5.11)
RDW: 18.6 % — ABNORMAL HIGH (ref 11.5–15.5)
WBC Count: 5.4 10*3/uL (ref 4.0–10.5)
nRBC: 0 % (ref 0.0–0.2)

## 2021-02-03 LAB — IRON AND IRON BINDING CAPACITY (CC-WL,HP ONLY)
Iron: 43 ug/dL (ref 28–170)
Saturation Ratios: 9 % — ABNORMAL LOW (ref 10.4–31.8)
TIBC: 463 ug/dL — ABNORMAL HIGH (ref 250–450)
UIBC: 420 ug/dL (ref 148–442)

## 2021-02-03 LAB — FERRITIN: Ferritin: 35 ng/mL (ref 11–307)

## 2021-02-03 MED ORDER — SODIUM CHLORIDE 0.9% FLUSH
10.0000 mL | INTRAVENOUS | Status: DC | PRN
  Administered 2021-02-03: 10 mL

## 2021-02-03 MED ORDER — PALONOSETRON HCL INJECTION 0.25 MG/5ML
0.2500 mg | Freq: Once | INTRAVENOUS | Status: AC
  Administered 2021-02-03: 0.25 mg via INTRAVENOUS
  Filled 2021-02-03: qty 5

## 2021-02-03 MED ORDER — SODIUM CHLORIDE 0.9 % IV SOLN: Freq: Once | INTRAVENOUS | Status: AC

## 2021-02-03 MED ORDER — HEPARIN SOD (PORK) LOCK FLUSH 100 UNIT/ML IV SOLN
500.0000 [IU] | Freq: Once | INTRAVENOUS | Status: AC | PRN
  Administered 2021-02-03: 500 [IU]

## 2021-02-03 MED ORDER — SODIUM CHLORIDE 0.9 % IV SOLN
300.0000 mg | Freq: Once | INTRAVENOUS | Status: AC
  Administered 2021-02-03: 300 mg via INTRAVENOUS
  Filled 2021-02-03: qty 300

## 2021-02-03 MED ORDER — SODIUM CHLORIDE 0.9 % IV SOLN
10.0000 mg | Freq: Once | INTRAVENOUS | Status: AC
  Administered 2021-02-03: 10 mg via INTRAVENOUS
  Filled 2021-02-03: qty 10

## 2021-02-03 MED ORDER — SODIUM CHLORIDE 0.9% FLUSH
10.0000 mL | Freq: Once | INTRAVENOUS | Status: AC
  Administered 2021-02-03: 10 mL

## 2021-02-03 MED ORDER — SODIUM CHLORIDE 0.9 % IV SOLN
500.0000 mg/m2 | Freq: Once | INTRAVENOUS | Status: AC
  Administered 2021-02-03: 1200 mg via INTRAVENOUS
  Filled 2021-02-03: qty 60

## 2021-02-03 MED ORDER — DOXORUBICIN HCL CHEMO IV INJECTION 2 MG/ML
50.0000 mg/m2 | Freq: Once | INTRAVENOUS | Status: AC
  Administered 2021-02-03: 120 mg via INTRAVENOUS
  Filled 2021-02-03: qty 60

## 2021-02-03 NOTE — Progress Notes (Signed)
Met with patient at registration to complete [employer] paperwork.  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 Discussed in detail expenses and how they are covered. She has a copy of the approval letter and expense sheet along with the Outpatient pharmacy information.  She has paperwork and my card in green folder for any additional financial questions or concerns.

## 2021-02-03 NOTE — Patient Instructions (Addendum)
[facility]   Discharge Instructions: Thank you for choosing [facility] to provide your oncology and hematology care.   If you have a lab appointment with the [facility], please go directly to the [facility] and check in at the registration area.   Wear comfortable clothing and clothing appropriate for easy access to any Portacath or PICC line.   We strive to give you quality time with your provider. You may need to reschedule your appointment if you arrive late (15 or more minutes).  Arriving late affects you and other patients whose appointments are after yours.  Also, if you miss three or more appointments without notifying the office, you may be dismissed from the clinic at the providers discretion.      For prescription refill requests, have your pharmacy contact our office and allow 72 hours for refills to be completed.    Today you received the following chemotherapy and/or immunotherapy agents: doxorubicin and cyclophosphamide      To help prevent nausea and vomiting after your treatment, we encourage you to take your nausea medication as directed.  BELOW ARE SYMPTOMS THAT SHOULD BE REPORTED IMMEDIATELY: *FEVER GREATER THAN 100.4 F (38 C) OR HIGHER *CHILLS OR SWEATING *NAUSEA AND VOMITING THAT IS NOT CONTROLLED WITH YOUR NAUSEA MEDICATION *UNUSUAL SHORTNESS OF BREATH *UNUSUAL BRUISING OR BLEEDING *URINARY PROBLEMS (pain or burning when urinating, or frequent urination) *BOWEL PROBLEMS (unusual diarrhea, constipation, pain near the anus) TENDERNESS IN MOUTH AND THROAT WITH OR WITHOUT PRESENCE OF ULCERS (sore throat, sores in mouth, or a toothache) UNUSUAL RASH, SWELLING OR PAIN  UNUSUAL VAGINAL DISCHARGE OR ITCHING   Items with * indicate a potential emergency and should be followed up as soon as possible or go to the Emergency Department if any problems should occur.  Please show the CHEMOTHERAPY ALERT CARD or IMMUNOTHERAPY  ALERT CARD at check-in to the Emergency Department and triage nurse.  Should you have questions after your visit or need to cancel or reschedule your appointment, please contact [facility]  Dept: [phone]  and follow the prompts.  Office hours are 8:00 a.m. to 4:30 p.m. Monday - Friday. Please note that voicemails left after 4:00 p.m. may not be returned until the following business day.  We are closed weekends and major holidays. You have access to a nurse at all times for urgent questions. Please call the main number to the clinic Dept: [phone] and follow the prompts.   For any non-urgent questions, you may also contact your provider using MyChart. We now offer e-Visits for anyone 65 and older to request care online for non-urgent symptoms. For details visit [email or web address].   Also download the MyChart app! Go to the app store, search "MyChart", open the app, select [facility], and log in with your MyChart username and password.  Due to Covid, a mask is required upon entering the hospital/clinic. If you do not have a mask, one will be given to you upon arrival. For doctor visits, patients may have 1 support person aged 64 or older with them. For treatment visits, patients cannot have anyone with them due to current Covid guidelines and our immunocompromised population.    Iron Sucrose Injection What is this medication? IRON SUCROSE (EYE ern SOO krose) treats low levels of iron (iron deficiency anemia) in people with kidney disease. Iron is a mineral that plays an important role in making red blood cells, which carry oxygen from your  lungs to the rest of your body. This medicine may be used for other purposes; ask your health care provider or pharmacist if you have questions. COMMON BRAND NAME(S): Venofer What should I tell my care team before I take this medication? They need to know if you have any of these conditions: Anemia not caused by low  iron levels Heart disease High levels of iron in the blood Kidney disease Liver disease An unusual or allergic reaction to iron, other medications, foods, dyes, or preservatives Pregnant or trying to get pregnant Breast-feeding How should I use this medication? This medication is for infusion into a vein. It is given in a hospital or clinic setting. Talk to your care team about the use of this medication in children. While this medication may be prescribed for children as young as 2 years for selected conditions, precautions do apply. Overdosage: If you think you have taken too much of this medicine contact a poison control center or emergency room at once. NOTE: This medicine is only for you. Do not share this medicine with others. What if I miss a dose? It is important not to miss your dose. Call your care team if you are unable to keep an appointment. What may interact with this medication? Do not take this medication with any of the following: Deferoxamine Dimercaprol Other iron products This medication may also interact with the following: Chloramphenicol Deferasirox This list may not describe all possible interactions. Give your health care provider a list of all the medicines, herbs, non-prescription drugs, or dietary supplements you use. Also tell them if you smoke, drink alcohol, or use illegal drugs. Some items may interact with your medicine. What should I watch for while using this medication? Visit your care team regularly. Tell your care team if your symptoms do not start to get better or if they get worse. You may need blood work done while you are taking this medication. You may need to follow a special diet. Talk to your care team. Foods that contain iron include: whole grains/cereals, dried fruits, beans, or peas, leafy green vegetables, and organ meats (liver, kidney). What side effects may I notice from receiving this medication? Side effects that you should report to  your care team as soon as possible: Allergic reactions--skin rash, itching, hives, swelling of the face, lips, tongue, or throat Low blood pressure--dizziness, feeling faint or lightheaded, blurry vision Shortness of breath Side effects that usually do not require medical attention (report to your care team if they continue or are bothersome): Flushing Headache Joint pain Muscle pain Nausea Pain, redness, or irritation at injection site This list may not describe all possible side effects. Call your doctor for medical advice about side effects. You may report side effects to FDA at 1-800-FDA-1088. Where should I keep my medication? This medication is given in a hospital or clinic and will not be stored at home. NOTE: This sheet is a summary. It may not cover all possible information. If you have questions about this medicine, talk to your doctor, pharmacist, or health care provider.  2022 Elsevier/Gold Standard (2020-06-04 00:00:00)

## 2021-02-03 NOTE — Progress Notes (Signed)
Patient Care Team: [name], MD as PCP - General (Family Medicine) Nicholas Lose, MD as Consulting Physician (Hematology and Oncology) Servando Salina, MD as Consulting Physician (Obstetrics and Gynecology) Irene Limbo, MD as Consulting Physician (Plastic Surgery)  DIAGNOSIS:  Encounter Diagnosis  Name Primary?   Malignant neoplasm of upper-outer quadrant of right breast in female, estrogen receptor positive ([place])     SUMMARY OF ONCOLOGIC HISTORY: Oncology History  Malignant neoplasm of upper-outer quadrant of right breast in female, estrogen receptor positive ([place])  12/08/2020 Initial Diagnosis   21-week pregnancy: MRI at [facility] noncontrast revealed right axillary mass 3.7 cm, subpectoral lymph nodes, left supraclavicular lymph node, multiple level 2 and 3 lymph nodes in the right axilla, 2 breast masses 1.1 cm largest size.  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  The report dates are 12/23/2020 and 12/29/2020.   The BRCAplus panel offered by [employer] and includes sequencing and deletion/duplication analysis for the following 8 genes: ATM, BRCA1, BRCA2, CDH1, CHEK2, PALB2, PTEN, and TP53.  The CustomNext-Cancer+RNAinsight panel offered by [facility] includes sequencing  and rearrangement analysis for the following 47 genes:  APC, ATM, AXIN2, BARD1, BMPR1A, BRCA1, BRCA2, BRIP1, CDH1, CDK4, CDKN2A, CHEK2, DICER1, EPCAM, GREM1, HOXB13, MEN1, MLH1, MSH2, MSH3, MSH6, MUTYH, NBN, NF1, NF2, NTHL1, PALB2, PMS2, POLD1, POLE, PTEN, RAD51C, RAD51D, RECQL, RET, SDHA, SDHAF2, SDHB, SDHC, SDHD, SMAD4, SMARCA4, STK11, TP53, TSC1, TSC2, and VHL.  RNA data is routinely analyzed for use in variant interpretation for all genes.     CHIEF COMPLIANT: Cycle 3 Adriamycin Cytoxan  INTERVAL HISTORY: Evelyn Marshall is a 36 year old pregnant lady with above-mentioned history of breast cancer is currently neoadjuvant chemotherapy today cycle 3 of Adriamycin and Cytoxan being given every 3 weeks.  After last cycle of chemotherapy she had exceptional amount of nausea and vomiting and had to come to the emergency room and subsequently [facility].  She is here today to receive her third cycle of chemo.  Over the past 2 weeks she has been feeling much better.  She tells me that the week after chemo she feels very tired and nauseated.   ALLERGIES:  has No Known Allergies.  MEDICATIONS:  Current Outpatient Medications  Medication Sig Dispense Refill   aspirin EC 81 MG tablet Take 81 mg by mouth daily. Swallow whole.     dexamethasone (DECADRON) 4 MG tablet Take 1 tablet (4 mg total) by mouth daily. Take 1 tablet day after chemo and 1 tablet 2 days after chemo with food 8 tablet 1   Doxylamine-Pyridoxine 10-10 MG TBEC Take 1 tablet by mouth 3 (three) times daily as needed (nausea).     Iron-FA-B Cmp-C-Biot-Probiotic (FUSION PLUS) CAPS Take 1 capsule by mouth daily.  lidocaine-prilocaine (EMLA) cream Apply to affected area once 30 g 3   ondansetron (ZOFRAN) 8 MG tablet Take 1 tablet (8 mg total) by mouth 2 (two) times daily as needed. Start on the third day after chemotherapy. 30 tablet 1   No current facility-administered medications for this visit.    Facility-Administered Medications Ordered in Other Visits  Medication Dose Route Frequency Provider Last Rate Last Admin   cyclophosphamide (CYTOXAN) 1,200 mg in sodium chloride 0.9 % 250 mL chemo infusion  500 mg/m2 (Treatment Plan Recorded) Intravenous Once Nicholas Lose, MD       DOXOrubicin (ADRIAMYCIN) chemo injection 120 mg  50 mg/m2 (Treatment Plan Recorded) Intravenous Once Nicholas Lose, MD       heparin lock flush 100 unit/mL  500 Units Intracatheter Once PRN Nicholas Lose, MD       sodium chloride flush (NS) 0.9 % injection 10 mL  10 mL Intracatheter PRN Nicholas Lose, MD        PHYSICAL EXAMINATION: ECOG PERFORMANCE STATUS: 1 - Symptomatic but completely ambulatory  Vitals:   02/03/21 1020  BP: (!) 150/88  Pulse: (!) 110  Resp: 19  Temp: (!) 97.3 F (36.3 C)  SpO2: 100%   Filed Weights   02/03/21 1020  Weight: 292 lb 6.4 oz (132.6 kg)      LABORATORY DATA:  I have reviewed the data as listed CMP Latest Ref Rng & Units 02/03/2021 01/26/2021 01/13/2021  Glucose 70 - 99 mg/dL 118(H) 86 115(H)  BUN 6 - 20 mg/dL <5(L) 5(L) 6  Creatinine 0.44 - 1.00 mg/dL 0.45 0.53 0.47  Sodium 135 - 145 mmol/L 136 134(L) 135  Potassium 3.5 - 5.1 mmol/L 3.1(L) 3.6 3.4(L)  Chloride 98 - 111 mmol/L 106 103 106  CO2 22 - 32 mmol/L 22 21(L) 21(L)  Calcium 8.9 - 10.3 mg/dL 8.4(L) 8.7(L) 8.8(L)  Total Protein 6.5 - 8.1 g/dL 6.2(L) 6.7 6.5  Total Bilirubin 0.3 - 1.2 mg/dL 0.2(L) 0.5 0.2(L)  Alkaline Phos 38 - 126 U/L 115 137(H) 123  AST 15 - 41 U/L 18 36 14(L)  ALT 0 - 44 U/L 29 33 19    Lab Results  Component Value Date   WBC 5.4 02/03/2021   HGB 8.5 (L) 02/03/2021   HCT 26.9 (L) 02/03/2021   MCV 75.1 (L) 02/03/2021   PLT 396 02/03/2021   NEUTROABS 4.1 02/03/2021    ASSESSMENT & PLAN:  Malignant neoplasm of upper-outer quadrant of right breast in female, estrogen receptor positive ([place]) 12/08/2020:21-week pregnancy: MRI at [facility] noncontrast revealed right axillary mass 3.7 cm,  subpectoral lymph nodes, left supraclavicular lymph node, multiple level 2 and 3 lymph nodes in the right axilla, 2 breast masses 1.1 cm largest size.  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  Neoadjuvant chemotherapy with Adriamycin and Cytoxan every 3 weeks x4 started 12/24/2020 2. delivery of the baby 3.  Continue neoadjuvant therapy with Taxol Herceptin and Perjeta 4.  Mastectomy with ALND 5.  Adjuvant radiation therapy 6.  Adjuvant antiestrogen therapy with ovarian function suppression Genetic testing ---------------------------------------------------------------------------------------------------------------------------------- Current Treatment: Cycle 3 AC Chemo Toxicities: Tolerated first cycle of chemotherapy extremely well. Anemia: Microcytic in nature: Iron deficient.  She will get IV iron today along with her chemo.  2. severe nausea vomiting: After cycle 2.  We will reduce the dosage of chemo today and we will bring her back next week for some fluids and Zofran.   RTC in 3 weeks for cycle 4     No orders of the defined types were placed in this encounter.  The patient has a good understanding of the overall plan. she agrees with it. she will call with any problems that may develop before the next visit here. Total time spent: 30 mins including face to face time and time spent for planning, charting and co-ordination of care   Harriette Ohara, MD 02/03/21

## 2021-02-03 NOTE — Progress Notes (Signed)
Give IV iron today per Dr Lindi Adie

## 2021-02-04 ENCOUNTER — Telehealth: Payer: Self-pay | Admitting: Hematology and Oncology

## 2021-02-04 NOTE — Telephone Encounter (Signed)
Scheduled appointment per 01/12 los. Patient aware.

## 2021-02-08 ENCOUNTER — Other Ambulatory Visit: Payer: Self-pay

## 2021-02-08 ENCOUNTER — Other Ambulatory Visit: Payer: Self-pay | Admitting: *Deleted

## 2021-02-08 ENCOUNTER — Inpatient Hospital Stay: Payer: BC Managed Care – PPO

## 2021-02-08 VITALS — BP 129/81 | HR 101 | Temp 98.8°F | Resp 24

## 2021-02-08 DIAGNOSIS — Z23 Encounter for immunization: Secondary | ICD-10-CM

## 2021-02-08 DIAGNOSIS — Z5111 Encounter for antineoplastic chemotherapy: Secondary | ICD-10-CM | POA: Diagnosis not present

## 2021-02-08 DIAGNOSIS — Z170 Estrogen receptor positive status [ER+]: Secondary | ICD-10-CM

## 2021-02-08 DIAGNOSIS — Z95828 Presence of other vascular implants and grafts: Secondary | ICD-10-CM

## 2021-02-08 DIAGNOSIS — C50411 Malignant neoplasm of upper-outer quadrant of right female breast: Secondary | ICD-10-CM

## 2021-02-08 MED ORDER — INFLUENZA VAC SPLIT QUAD 0.5 ML IM SUSY
0.5000 mL | PREFILLED_SYRINGE | Freq: Once | INTRAMUSCULAR | Status: AC
  Administered 2021-02-08: 0.5 mL via INTRAMUSCULAR
  Filled 2021-02-08: qty 0.5

## 2021-02-08 MED ORDER — ONDANSETRON HCL 4 MG/2ML IJ SOLN
8.0000 mg | Freq: Once | INTRAMUSCULAR | Status: AC
  Administered 2021-02-08: 8 mg via INTRAVENOUS
  Filled 2021-02-08: qty 4

## 2021-02-08 MED ORDER — SODIUM CHLORIDE 0.9 % IV SOLN: INTRAVENOUS | Status: DC

## 2021-02-08 MED ORDER — HEPARIN SOD (PORK) LOCK FLUSH 100 UNIT/ML IV SOLN
500.0000 [IU] | Freq: Once | INTRAVENOUS | Status: AC
  Administered 2021-02-08: 500 [IU]

## 2021-02-08 MED ORDER — SODIUM CHLORIDE 0.9% FLUSH
10.0000 mL | Freq: Once | INTRAVENOUS | Status: AC
  Administered 2021-02-08: 10 mL

## 2021-02-08 MED ORDER — SODIUM CHLORIDE 0.9 % IV SOLN: 8.0000 mg | Freq: Once | INTRAVENOUS | Status: DC

## 2021-02-08 NOTE — Progress Notes (Signed)
Per Dr. Lindi Adie ok for flu shot today.

## 2021-02-08 NOTE — Patient Instructions (Signed)
Rehydration, Adult °Rehydration is the replacement of body fluids, salts, and minerals (electrolytes) that are lost during dehydration. Dehydration is when there is not enough water or other fluids in the body. This happens when you lose more fluids than you take in. Common causes of dehydration include: °Not drinking enough fluids. This can occur when you are ill or doing activities that require a lot of energy, especially in hot weather. °Conditions that cause loss of water or other fluids, such as diarrhea, vomiting, sweating, or urinating a lot. °Other illnesses, such as fever or infection. °Certain medicines, such as those that remove excess fluid from the body (diuretics). °Symptoms of mild or moderate dehydration may include thirst, dry lips and mouth, and dizziness. Symptoms of severe dehydration may include increased heart rate, confusion, fainting, and not urinating. °For severe dehydration, you may need to get fluids through an IV at the hospital. For mild or moderate dehydration, you can usually rehydrate at home by drinking certain fluids as told by your health care provider. °What are the risks? °Generally, rehydration is safe. However, taking in too much fluid (overhydration) can be a problem. This is rare. Overhydration can cause an electrolyte imbalance, kidney failure, or a decrease in salt (sodium) levels in the body. °Supplies needed °You will need an oral rehydration solution (ORS) if your health care provider tells you to use one. This is a drink to treat dehydration. It can be found in pharmacies and retail stores. °How to rehydrate °Fluids °Follow instructions from your health care provider for rehydration. The kind of fluid and the amount you should drink depend on your condition. In general, you should choose drinks that you prefer. °If told by your health care provider, drink an ORS. °Make an ORS by following instructions on the package. °Start by drinking small amounts, about ½ cup (120  mL) every 5-10 minutes. °Slowly increase how much you drink until you have taken the amount recommended by your health care provider. °Drink enough clear fluids to keep your urine pale yellow. If you were told to drink an ORS, finish it first, then start slowly drinking other clear fluids. Drink fluids such as: °Water. This includes sparkling water and flavored water. Drinking only water can lead to having too little sodium in your body (hyponatremia). Follow the advice of your health care provider. °Water from ice chips you suck on. °Fruit juice with water you add to it (diluted). °Sports drinks. °Hot or cold herbal teas. °Broth-based soups. °Milk or milk products. °Food °Follow instructions from your health care provider about what to eat while you rehydrate. Your health care provider may recommend that you slowly begin eating regular foods in small amounts. °Eat foods that contain a healthy balance of electrolytes, such as bananas, oranges, potatoes, tomatoes, and spinach. °Avoid foods that are greasy or contain a lot of sugar. °In some cases, you may get nutrition through a feeding tube that is passed through your nose and into your stomach (nasogastric tube, or NG tube). This may be done if you have uncontrolled vomiting or diarrhea. °Beverages to avoid °Certain beverages may make dehydration worse. While you rehydrate, avoid drinking alcohol. °How to tell if you are recovering from dehydration °You may be recovering from dehydration if: °You are urinating more often than before you started rehydrating. °Your urine is pale yellow. °Your energy level improves. °You vomit less frequently. °You have diarrhea less frequently. °Your appetite improves or returns to normal. °You feel less dizzy or less light-headed. °Your   skin tone and color start to look more normal. Follow these instructions at home: Take over-the-counter and prescription medicines only as told by your health care provider. Do not take sodium  tablets. Doing this can lead to having too much sodium in your body (hypernatremia). Contact a health care provider if: You continue to have symptoms of mild or moderate dehydration, such as: Thirst. Dry lips. Slightly dry mouth. Dizziness. Dark urine or less urine than normal. Muscle cramps. You continue to vomit or have diarrhea. Get help right away if you: Have symptoms of dehydration that get worse. Have a fever. Have a severe headache. Have been vomiting and the following happens: Your vomiting gets worse or does not go away. Your vomit includes blood or green matter (bile). You cannot eat or drink without vomiting. Have problems with urination or bowel movements, such as: Diarrhea that gets worse or does not go away. Blood in your stool (feces). This may cause stool to look [place] and tarry. Not urinating, or urinating only a small amount of very dark urine, within 6-8 hours. Have trouble breathing. Have symptoms that get worse with treatment. These symptoms may represent a serious problem that is an emergency. Do not wait to see if the symptoms will go away. Get medical help right away. Call your local emergency services (911 in the [place] Do not drive yourself to the hospital. Summary Rehydration is the replacement of body fluids and minerals (electrolytes) that are lost during dehydration. Follow instructions from your health care provider for rehydration. The kind of fluid and amount you should drink depend on your condition. Slowly increase how much you drink until you have taken the amount recommended by your health care provider. Contact your health care provider if you continue to show signs of mild or moderate dehydration. This information is not intended to replace advice given to you by your health care provider. Make sure you discuss any questions you have with your health care provider. Document Revised: 03/12/2019 Document Reviewed: 01/20/2019 Elsevier Patient  Education  2022 [place].  Nausea, Adult Nausea is feeling like you may vomit. Feeling like you may vomit is usually not serious, but it may be an early sign of a more serious medical problem. Vomiting is when stomach contents forcefully come out of your mouth. If you vomit, or if you are not able to drink enough fluids, you may not have enough water in your body (get dehydrated). If you do not have enough water in your body, you may: Feel tired. Feel thirsty. Have a dry mouth. Have cracked lips. Pee (urinate) less often. Older adults and people who have other diseases or a weak body defense system (immune system) have a higher risk of not having enough water in the body. The main goals of treating this condition are: To relieve your nausea. To ensure your nausea occurs less often. To prevent vomiting and losing too much fluid. Follow these instructions at home: Watch your symptoms for any changes. Tell your doctor about them. Eating and drinking   Take an ORS (oral rehydration solution). This is a drink that is sold at pharmacies and stores. Drink clear fluids in small amounts as you are able. These include: Water. Ice chips. Fruit juice that has water added (diluted fruit juice). Low-calorie sports drinks. Eat bland, easy-to-digest foods in small amounts as you are able, such as: Bananas. Applesauce. Rice. Low-fat (lean) meats. Toast. Crackers. Avoid drinking fluids that have a lot of sugar or caffeine in  them. This includes energy drinks, sports drinks, and soda. Avoid alcohol. Avoid spicy or fatty foods. General instructions Take over-the-counter and prescription medicines only as told by your doctor. Rest at home while you get better. Drink enough fluid to keep your pee (urine) pale yellow. Take slow and deep breaths when you feel like you may vomit. Avoid food or things that have strong smells. Wash your hands often with soap and water for at least 20 seconds. If  you cannot use soap and water, use hand sanitizer. Make sure that everyone in your home washes their hands well and often. Keep all follow-up visits. Contact a doctor if: You feel worse. You feel like you may vomit and this lasts for more than 2 days. You vomit. You are not able to drink fluids without vomiting. You have new symptoms. You have a fever. You have a headache. You have muscle cramps. You have a rash. You have pain while peeing. You feel light-headed or dizzy. Get help right away if: You have pain in your chest, neck, arm, or jaw. You feel very weak or you faint. You have vomit that is bright red or looks like coffee grounds. You have bloody or [place] poop (stools) or poop that looks like tar. You have a very bad headache, a stiff neck, or both. You have very bad pain, cramping, or bloating in your belly (abdomen). You have trouble breathing or you are breathing very quickly. Your heart is beating very quickly. Your skin feels cold and clammy. You feel confused. You have signs of losing too much water in your body, such as: Dark pee, very little pee, or no pee. Cracked lips. Dry mouth. Sunken eyes. Sleepiness. Weakness. These symptoms may be an emergency. Get help right away. Call 911. Do not wait to see if the symptoms will go away. Do not drive yourself to the hospital. Summary Nausea is feeling like you are about vomit. If you vomit, or if you are not able to drink enough fluids, you may not have enough water in your body (get dehydrated). Eat and drink what your doctor tells you. Take over-the-counter and prescription medicines only as told by your doctor. Contact a doctor right away if your symptoms get worse or you have new symptoms. Keep all follow-up visits. This information is not intended to replace advice given to you by your health care provider. Make sure you discuss any questions you have with your health care provider. Document Revised: 07/16/2020  Document Reviewed: 07/16/2020 Elsevier Patient Education  [place].

## 2021-02-15 ENCOUNTER — Telehealth: Payer: Self-pay | Admitting: *Deleted

## 2021-02-15 NOTE — Telephone Encounter (Signed)
Pt called with recommendations from ob/gyn for need of iron infusions prior to delivery. Msg sent to Dr. Burr Medico and Regan Rakers NP with obgyn recommendations. Informed pt msg was sent. Pt will likely be induced around 04/06/21. Currently scheduled for 04/22/21 No further needs or questions voiced at this time.

## 2021-02-16 ENCOUNTER — Telehealth: Payer: Self-pay | Admitting: Hematology and Oncology

## 2021-02-16 NOTE — Telephone Encounter (Signed)
Sch per 1/24 inbasket, pt aware

## 2021-02-22 ENCOUNTER — Encounter: Payer: Self-pay | Admitting: Hematology and Oncology

## 2021-02-22 MED FILL — Dexamethasone Sodium Phosphate Inj 100 MG/10ML: INTRAMUSCULAR | Qty: 1 | Status: AC

## 2021-02-22 NOTE — Assessment & Plan Note (Signed)
12/08/2020:21-week pregnancy: MRI at [place] noncontrast revealed right axillary mass 3.7 cm, subpectoral lymph nodes, left supraclavicular lymph node, multiple level 2 and 3 lymph nodes in the right axilla, 2 breast masses 1.1 cm largest size. 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 Neoadjuvant chemotherapy with Adriamycin and Cytoxan every 3 weeks x4started 12/24/2020 2. delivery of the baby 3. Continue neoadjuvant therapy with Taxol Herceptin and Perjeta 4. Mastectomy with ALND 5. Adjuvant radiation therapy 6. Adjuvant antiestrogen therapy with ovarian function suppression Genetic testing ---------------------------------------------------------------------------------------------------------------------------------- Current Treatment: Cycle 4 AC Chemo Toxicities:Tolerated first cycle of chemotherapy extremely well. 1. Anemia: Microcytic in nature: Iron deficient.  She will get IV iron today along with her chemo. 2. severe nausea vomiting: After cycle 2.  We will reduce the dosage of chemo today and we will bring her back next week for some fluids and Zofran.   RTC after delivery for the remainder of treatment

## 2021-02-22 NOTE — Progress Notes (Signed)
Patient Care Team: [name], MD as PCP - General (Family Medicine) Nicholas Lose, MD as Consulting Physician (Hematology and Oncology) Servando Salina, MD as Consulting Physician (Obstetrics and Gynecology) Irene Limbo, MD as Consulting Physician (Plastic Surgery)  DIAGNOSIS:    ICD-10-CM   1. Malignant neoplasm of upper-outer quadrant of right breast in female, estrogen receptor positive ([place])  C50.411    Z17.0       SUMMARY OF ONCOLOGIC HISTORY: Oncology History  Malignant neoplasm of upper-outer quadrant of right breast in female, estrogen receptor positive ([place])  12/08/2020 Initial Diagnosis   21-week pregnancy: MRI at [place] noncontrast revealed right axillary mass 3.7 cm, subpectoral lymph nodes, left supraclavicular lymph node, multiple level 2 and 3 lymph nodes in the right axilla, 2 breast masses 1.1 cm largest size.  Mammogram in 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  The report dates are 12/23/2020 and 12/29/2020.   The BRCAplus panel offered by [employer] and includes sequencing and deletion/duplication analysis for the following 8 genes: ATM, BRCA1, BRCA2, CDH1, CHEK2, PALB2, PTEN, and TP53.  The CustomNext-Cancer+RNAinsight panel offered by [facility] includes sequencing  and rearrangement analysis for the following 47 genes:  APC, ATM, AXIN2, BARD1, BMPR1A, BRCA1, BRCA2, BRIP1, CDH1, CDK4, CDKN2A, CHEK2, DICER1, EPCAM, GREM1, HOXB13, MEN1, MLH1, MSH2, MSH3, MSH6, MUTYH, NBN, NF1, NF2, NTHL1, PALB2, PMS2, POLD1, POLE, PTEN, RAD51C, RAD51D, RECQL, RET, SDHA, SDHAF2, SDHB, SDHC, SDHD, SMAD4, SMARCA4, STK11, TP53, TSC1, TSC2, and VHL.  RNA data is routinely analyzed for use in variant interpretation for all genes.     CHIEF COMPLIANT: Cycle 4 Adriamycin Cytoxan  INTERVAL HISTORY: Evelyn Marshall is a 36 y.o. with above-mentioned history of breast cancer is currently neoadjuvant chemotherapy with Adriamycin and Cytoxan being given every 3 weeks. She presents to the clinic today for treatment.   ALLERGIES:  has No Known Allergies.  MEDICATIONS:  Current Outpatient Medications  Medication Sig Dispense Refill   aspirin EC 81 MG tablet Take 81 mg by mouth daily. Swallow whole.     dexamethasone (DECADRON) 4 MG tablet Take 1 tablet (4 mg total) by mouth daily. Take 1 tablet day after chemo and 1 tablet 2 days after chemo with food 8 tablet 1   Doxylamine-Pyridoxine 10-10 MG TBEC Take 1 tablet by mouth 3 (three) times daily as needed (nausea).     Iron-FA-B Cmp-C-Biot-Probiotic (FUSION PLUS) CAPS Take 1 capsule by mouth daily.     lidocaine-prilocaine (EMLA) cream Apply to affected area once 30 g 3   ondansetron (ZOFRAN) 8 MG tablet Take 1 tablet (8 mg total) by mouth 2 (two) times daily as needed. Start on the third day after chemotherapy. 30 tablet 1   No current facility-administered medications for this visit.    PHYSICAL EXAMINATION: ECOG PERFORMANCE STATUS:  1 - Symptomatic but completely ambulatory  Vitals:   02/23/21 1157  BP: (!) 148/80  Pulse: 84  Resp: 18  Temp: 98.8 F (37.1 C)  SpO2: 95%   Filed Weights   02/23/21 1157  Weight: 295 lb 4.8 oz (133.9 kg)     LABORATORY DATA:  I have reviewed the data as listed CMP Latest Ref Rng & Units  02/03/2021 01/26/2021 01/13/2021  Glucose 70 - 99 mg/dL 118(H) 86 115(H)  BUN 6 - 20 mg/dL <5(L) 5(L) 6  Creatinine 0.44 - 1.00 mg/dL 0.45 0.53 0.47  Sodium 135 - 145 mmol/L 136 134(L) 135  Potassium 3.5 - 5.1 mmol/L 3.1(L) 3.6 3.4(L)  Chloride 98 - 111 mmol/L 106 103 106  CO2 22 - 32 mmol/L 22 21(L) 21(L)  Calcium 8.9 - 10.3 mg/dL 8.4(L) 8.7(L) 8.8(L)  Total Protein 6.5 - 8.1 g/dL 6.2(L) 6.7 6.5  Total Bilirubin 0.3 - 1.2 mg/dL 0.2(L) 0.5 0.2(L)  Alkaline Phos 38 - 126 U/L 115 137(H) 123  AST 15 - 41 U/L 18 36 14(L)  ALT 0 - 44 U/L 29 33 19    Lab Results  Component Value Date   WBC 5.2 02/23/2021   HGB 8.7 (L) 02/23/2021   HCT 27.2 (L) 02/23/2021   MCV 75.6 (L) 02/23/2021   PLT 383 02/23/2021   NEUTROABS 3.6 02/23/2021    ASSESSMENT & PLAN:  Malignant neoplasm of upper-outer quadrant of right breast in female, estrogen receptor positive ([place]) 12/08/2020:21-week pregnancy: MRI at [facility] noncontrast revealed right axillary mass 3.7 cm, subpectoral lymph nodes, left supraclavicular lymph node, multiple level 2 and 3 lymph nodes in the right axilla, 2 breast masses 1.1 cm largest size.  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  Neoadjuvant chemotherapy with Adriamycin and Cytoxan every 3 weeks x4 started 12/24/2020 2. delivery of the baby 3.  Continue neoadjuvant therapy with Taxol Herceptin and Perjeta 4.  Mastectomy with ALND 5.  Adjuvant radiation therapy 6.  Adjuvant antiestrogen therapy with ovarian function suppression Genetic testing ---------------------------------------------------------------------------------------------------------------------------------- Current Treatment: Cycle 4 AC Chemo Toxicities: Tolerated first cycle of  chemotherapy extremely well. Anemia: Microcytic in nature: Iron deficient.  She will get IV iron today along with her chemo.  Today's hemoglobin is 8.7.  She is receiving second dose of IV iron today and the third dose next week.  2. severe nausea vomiting: After cycle 2.   She did not have any further issues with nausea and vomiting with cycle 3.  RTC after delivery for the remainder of treatment She will inform us when she will deliver so that we can plan for follow-ups    No orders of the defined types were placed in this encounter.  The patient has a good understanding of the overall plan. she agrees with it. she will call with any problems that may develop before the next visit here.  Total time spent: 30 mins including face to face time and time spent for planning, charting and coordination of care  Rulon Eisenmenger, MD, MPH 02/23/2021  I, Thana Ates, am acting as scribe for Dr. Nicholas Lose.  I have reviewed the above documentation for accuracy and completeness, and I agree with the above.

## 2021-02-23 ENCOUNTER — Inpatient Hospital Stay: Payer: BC Managed Care – PPO

## 2021-02-23 ENCOUNTER — Encounter: Payer: Self-pay | Admitting: *Deleted

## 2021-02-23 ENCOUNTER — Other Ambulatory Visit: Payer: Self-pay

## 2021-02-23 ENCOUNTER — Inpatient Hospital Stay (HOSPITAL_BASED_OUTPATIENT_CLINIC_OR_DEPARTMENT_OTHER): Payer: BC Managed Care – PPO | Admitting: Hematology and Oncology

## 2021-02-23 ENCOUNTER — Inpatient Hospital Stay: Payer: BC Managed Care – PPO | Attending: Hematology and Oncology

## 2021-02-23 ENCOUNTER — Encounter: Payer: Self-pay | Admitting: Hematology and Oncology

## 2021-02-23 VITALS — BP 155/90 | HR 96 | Temp 98.9°F | Resp 18

## 2021-02-23 DIAGNOSIS — Z95828 Presence of other vascular implants and grafts: Secondary | ICD-10-CM

## 2021-02-23 DIAGNOSIS — D509 Iron deficiency anemia, unspecified: Secondary | ICD-10-CM | POA: Diagnosis not present

## 2021-02-23 DIAGNOSIS — Z5111 Encounter for antineoplastic chemotherapy: Secondary | ICD-10-CM | POA: Insufficient documentation

## 2021-02-23 DIAGNOSIS — Z170 Estrogen receptor positive status [ER+]: Secondary | ICD-10-CM

## 2021-02-23 DIAGNOSIS — C50411 Malignant neoplasm of upper-outer quadrant of right female breast: Secondary | ICD-10-CM

## 2021-02-23 DIAGNOSIS — O99013 Anemia complicating pregnancy, third trimester: Secondary | ICD-10-CM | POA: Insufficient documentation

## 2021-02-23 LAB — CMP (CANCER CENTER ONLY)
ALT: 16 U/L (ref 0–44)
AST: 21 U/L (ref 15–41)
Albumin: 3.3 g/dL — ABNORMAL LOW (ref 3.5–5.0)
Alkaline Phosphatase: 123 U/L (ref 38–126)
Anion gap: 7 (ref 5–15)
BUN: 5 mg/dL — ABNORMAL LOW (ref 6–20)
CO2: 24 mmol/L (ref 22–32)
Calcium: 9 mg/dL (ref 8.9–10.3)
Chloride: 104 mmol/L (ref 98–111)
Creatinine: 0.45 mg/dL (ref 0.44–1.00)
GFR, Estimated: >60 mL/min (ref >60)
Glucose, Bld: 100 mg/dL — ABNORMAL HIGH (ref 70–99)
Potassium: 3.5 mmol/L (ref 3.5–5.1)
Sodium: 135 mmol/L (ref 135–145)
Total Bilirubin: 0.2 mg/dL — ABNORMAL LOW (ref 0.3–1.2)
Total Protein: 6.5 g/dL (ref 6.5–8.1)

## 2021-02-23 LAB — CBC WITH DIFFERENTIAL (CANCER CENTER ONLY)
Abs Immature Granulocytes: 0.2 10*3/uL — ABNORMAL HIGH (ref 0.00–0.07)
Basophils Absolute: 0 10*3/uL (ref 0.0–0.1)
Basophils Relative: 0 %
Eosinophils Absolute: 0 10*3/uL (ref 0.0–0.5)
Eosinophils Relative: 1 %
HCT: 27.2 % — ABNORMAL LOW (ref 36.0–46.0)
Hemoglobin: 8.7 g/dL — ABNORMAL LOW (ref 12.0–15.0)
Immature Granulocytes: 4 %
Lymphocytes Relative: 9 %
Lymphs Abs: 0.4 10*3/uL — ABNORMAL LOW (ref 0.7–4.0)
MCH: 24.2 pg — ABNORMAL LOW (ref 26.0–34.0)
MCHC: 32 g/dL (ref 30.0–36.0)
MCV: 75.6 fL — ABNORMAL LOW (ref 80.0–100.0)
Monocytes Absolute: 0.9 10*3/uL (ref 0.1–1.0)
Monocytes Relative: 17 %
Neutro Abs: 3.6 10*3/uL (ref 1.7–7.7)
Neutrophils Relative %: 69 %
Platelet Count: 383 10*3/uL (ref 150–400)
RBC: 3.6 MIL/uL — ABNORMAL LOW (ref 3.87–5.11)
RDW: 19.7 % — ABNORMAL HIGH (ref 11.5–15.5)
WBC Count: 5.2 10*3/uL (ref 4.0–10.5)
nRBC: 0.4 % — ABNORMAL HIGH (ref 0.0–0.2)

## 2021-02-23 MED ORDER — SODIUM CHLORIDE 0.9 % IV SOLN: Freq: Once | INTRAVENOUS | Status: AC

## 2021-02-23 MED ORDER — PALONOSETRON HCL INJECTION 0.25 MG/5ML
0.2500 mg | Freq: Once | INTRAVENOUS | Status: AC
  Administered 2021-02-23: 0.25 mg via INTRAVENOUS
  Filled 2021-02-23: qty 5

## 2021-02-23 MED ORDER — SODIUM CHLORIDE 0.9 % IV SOLN
10.0000 mg | Freq: Once | INTRAVENOUS | Status: AC
  Administered 2021-02-23: 10 mg via INTRAVENOUS
  Filled 2021-02-23: qty 10

## 2021-02-23 MED ORDER — SODIUM CHLORIDE 0.9% FLUSH
10.0000 mL | INTRAVENOUS | Status: DC | PRN
  Administered 2021-02-23: 10 mL

## 2021-02-23 MED ORDER — SODIUM CHLORIDE 0.9 % IV SOLN
300.0000 mg | Freq: Once | INTRAVENOUS | Status: AC
  Administered 2021-02-23: 300 mg via INTRAVENOUS
  Filled 2021-02-23: qty 300

## 2021-02-23 MED ORDER — SODIUM CHLORIDE 0.9 % IV SOLN
500.0000 mg/m2 | Freq: Once | INTRAVENOUS | Status: AC
  Administered 2021-02-23: 1200 mg via INTRAVENOUS
  Filled 2021-02-23: qty 60

## 2021-02-23 MED ORDER — SODIUM CHLORIDE 0.9% FLUSH
10.0000 mL | Freq: Once | INTRAVENOUS | Status: AC
  Administered 2021-02-23: 10 mL

## 2021-02-23 MED ORDER — HEPARIN SOD (PORK) LOCK FLUSH 100 UNIT/ML IV SOLN
500.0000 [IU] | Freq: Once | INTRAVENOUS | Status: AC | PRN
  Administered 2021-02-23: 500 [IU]

## 2021-02-23 MED ORDER — DOXORUBICIN HCL CHEMO IV INJECTION 2 MG/ML
50.0000 mg/m2 | Freq: Once | INTRAVENOUS | Status: AC
  Administered 2021-02-23: 120 mg via INTRAVENOUS
  Filled 2021-02-23: qty 60

## 2021-02-23 NOTE — Progress Notes (Signed)
Pt observed for 30 minutes post Venofer inf. Pt tolerated trtmt well w/out incident.

## 2021-02-23 NOTE — Patient Instructions (Signed)
Iron Sucrose Injection What is this medication? IRON SUCROSE (EYE ern SOO krose) treats low levels of iron (iron deficiency anemia) in people with kidney disease. Iron is a mineral that plays an important role in making red blood cells, which carry oxygen from your lungs to the rest of your body. This medicine may be used for other purposes; ask your health care provider or pharmacist if you have questions. COMMON BRAND NAME(S): Venofer What should I tell my care team before I take this medication? They need to know if you have any of these conditions: Anemia not caused by low iron levels Heart disease High levels of iron in the blood Kidney disease Liver disease An unusual or allergic reaction to iron, other medications, foods, dyes, or preservatives Pregnant or trying to get pregnant Breast-feeding How should I use this medication? This medication is for infusion into a vein. It is given in a hospital or clinic setting. Talk to your care team about the use of this medication in children. While this medication may be prescribed for children as young as 2 years for selected conditions, precautions do apply. Overdosage: If you think you have taken too much of this medicine contact a poison control center or emergency room at once. NOTE: This medicine is only for you. Do not share this medicine with others. What if I miss a dose? It is important not to miss your dose. Call your care team if you are unable to keep an appointment. What may interact with this medication? Do not take this medication with any of the following: Deferoxamine Dimercaprol Other iron products This medication may also interact with the following: Chloramphenicol Deferasirox This list may not describe all possible interactions. Give your health care provider a list of all the medicines, herbs, non-prescription drugs, or dietary supplements you use. Also tell them if you smoke, drink alcohol, or use illegal drugs.  Some items may interact with your medicine. What should I watch for while using this medication? Visit your care team regularly. Tell your care team if your symptoms do not start to get better or if they get worse. You may need blood work done while you are taking this medication. You may need to follow a special diet. Talk to your care team. Foods that contain iron include: whole grains/cereals, dried fruits, beans, or peas, leafy green vegetables, and organ meats (liver, kidney). What side effects may I notice from receiving this medication? Side effects that you should report to your care team as soon as possible: Allergic reactions--skin rash, itching, hives, swelling of the face, lips, tongue, or throat Low blood pressure--dizziness, feeling faint or lightheaded, blurry vision Shortness of breath Side effects that usually do not require medical attention (report to your care team if they continue or are bothersome): Flushing Headache Joint pain Muscle pain Nausea Pain, redness, or irritation at injection site This list may not describe all possible side effects. Call your doctor for medical advice about side effects. You may report side effects to FDA at 1-800-FDA-1088. Where should I keep my medication? This medication is given in a hospital or clinic and will not be stored at home. NOTE: This sheet is a summary. It may not cover all possible information. If you have questions about this medicine, talk to your doctor, pharmacist, or health care provider.  2022 Elsevier/Gold Standard (2020-06-04 00:00:00)  [facility]  Discharge Instructions: Thank you for choosing [facility] to provide your oncology and  hematology care.   If you have a lab appointment with the [facility], please go directly to the [facility] and check in at the registration area.   Wear comfortable clothing and clothing appropriate for easy access to any Portacath  or PICC line.   We strive to give you quality time with your provider. You may need to reschedule your appointment if you arrive late (15 or more minutes).  Arriving late affects you and other patients whose appointments are after yours.  Also, if you miss three or more appointments without notifying the office, you may be dismissed from the clinic at the providers discretion.      For prescription refill requests, have your pharmacy contact our office and allow 72 hours for refills to be completed.    Today you received the following chemotherapy and/or immunotherapy agents: Adriamycin & Cytoxan    To help prevent nausea and vomiting after your treatment, we encourage you to take your nausea medication as directed.  BELOW ARE SYMPTOMS THAT SHOULD BE REPORTED IMMEDIATELY: *FEVER GREATER THAN 100.4 F (38 C) OR HIGHER *CHILLS OR SWEATING *NAUSEA AND VOMITING THAT IS NOT CONTROLLED WITH YOUR NAUSEA MEDICATION *UNUSUAL SHORTNESS OF BREATH *UNUSUAL BRUISING OR BLEEDING *URINARY PROBLEMS (pain or burning when urinating, or frequent urination) *BOWEL PROBLEMS (unusual diarrhea, constipation, pain near the anus) TENDERNESS IN MOUTH AND THROAT WITH OR WITHOUT PRESENCE OF ULCERS (sore throat, sores in mouth, or a toothache) UNUSUAL RASH, SWELLING OR PAIN  UNUSUAL VAGINAL DISCHARGE OR ITCHING   Items with * indicate a potential emergency and should be followed up as soon as possible or go to the Emergency Department if any problems should occur.  Please show the CHEMOTHERAPY ALERT CARD or IMMUNOTHERAPY ALERT CARD at check-in to the Emergency Department and triage nurse.  Should you have questions after your visit or need to cancel or reschedule your appointment, please contact [facility]  Dept: [phone]  and follow the prompts.  Office hours are 8:00 a.m. to 4:30 p.m. Monday - Friday. Please note that voicemails left after 4:00 p.m. may not be returned until the  following business day.  We are closed weekends and major holidays. You have access to a nurse at all times for urgent questions. Please call the main number to the clinic Dept: [phone] and follow the prompts.   For any non-urgent questions, you may also contact your provider using MyChart. We now offer e-Visits for anyone 69 and older to request care online for non-urgent symptoms. For details visit [email or web address].   Also download the MyChart app! Go to the app store, search "MyChart", open the app, select [facility], and log in with your MyChart username and password.  Due to Covid, a mask is required upon entering the hospital/clinic. If you do not have a mask, one will be given to you upon arrival. For doctor visits, patients may have 1 support person aged 26 or older with them. For treatment visits, patients cannot have anyone with them due to current Covid guidelines and our immunocompromised population.

## 2021-02-24 ENCOUNTER — Ambulatory Visit: Payer: BC Managed Care – PPO | Attending: Obstetrics and Gynecology

## 2021-02-24 ENCOUNTER — Encounter: Payer: Self-pay | Admitting: *Deleted

## 2021-02-24 ENCOUNTER — Encounter: Payer: Self-pay | Admitting: Hematology and Oncology

## 2021-02-24 ENCOUNTER — Ambulatory Visit: Payer: BC Managed Care – PPO | Admitting: *Deleted

## 2021-02-24 ENCOUNTER — Other Ambulatory Visit: Payer: Self-pay | Admitting: *Deleted

## 2021-02-24 VITALS — BP 138/78 | HR 94

## 2021-02-24 DIAGNOSIS — O9A113 Malignant neoplasm complicating pregnancy, third trimester: Secondary | ICD-10-CM

## 2021-02-24 DIAGNOSIS — Z3A31 31 weeks gestation of pregnancy: Secondary | ICD-10-CM

## 2021-02-24 DIAGNOSIS — C50919 Malignant neoplasm of unspecified site of unspecified female breast: Secondary | ICD-10-CM

## 2021-02-24 DIAGNOSIS — O10013 Pre-existing essential hypertension complicating pregnancy, third trimester: Secondary | ICD-10-CM

## 2021-02-24 DIAGNOSIS — O10913 Unspecified pre-existing hypertension complicating pregnancy, third trimester: Secondary | ICD-10-CM

## 2021-02-24 DIAGNOSIS — O09293 Supervision of pregnancy with other poor reproductive or obstetric history, third trimester: Secondary | ICD-10-CM

## 2021-02-24 IMAGING — US US MFM OB FOLLOW-UP
1 series · 14 of 27 positions shown · non-contrast
Comparison: none

[Series 1: us mfm ob follow-up · 27 acquisitions, 14 frames shown]
[im 1/27]
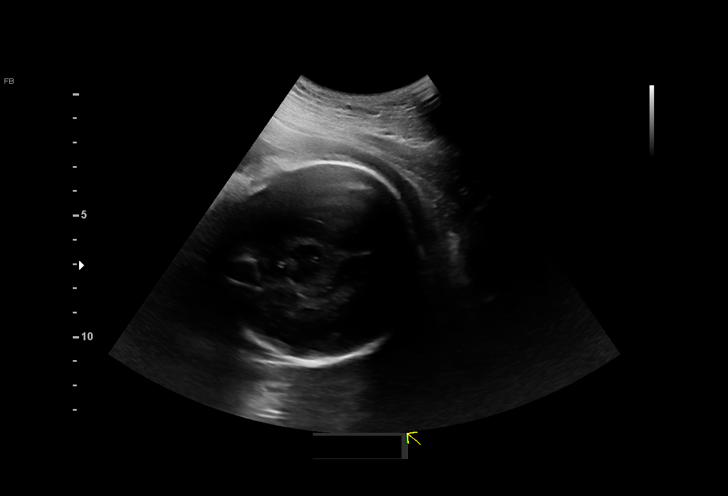
[im 3/27]
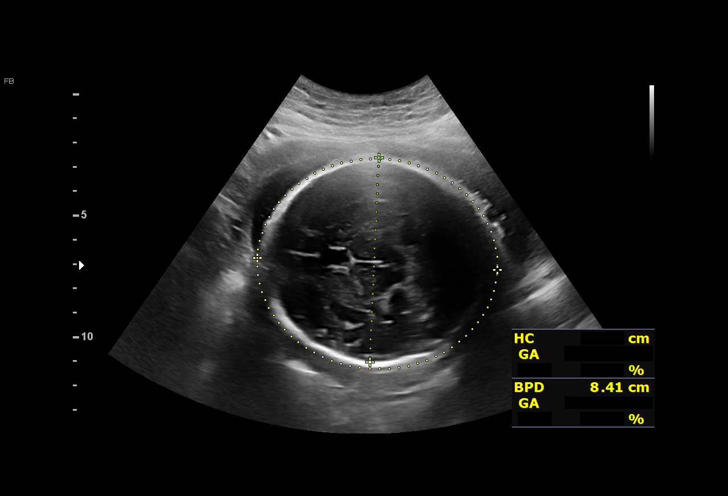
[im 5/27]
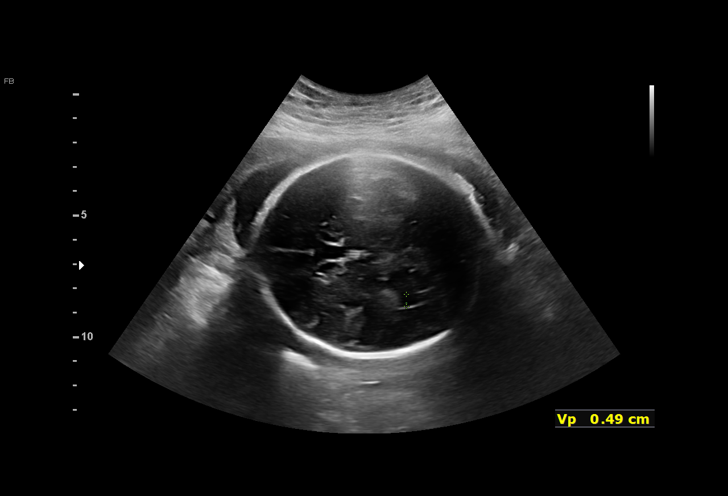
[im 7/27]
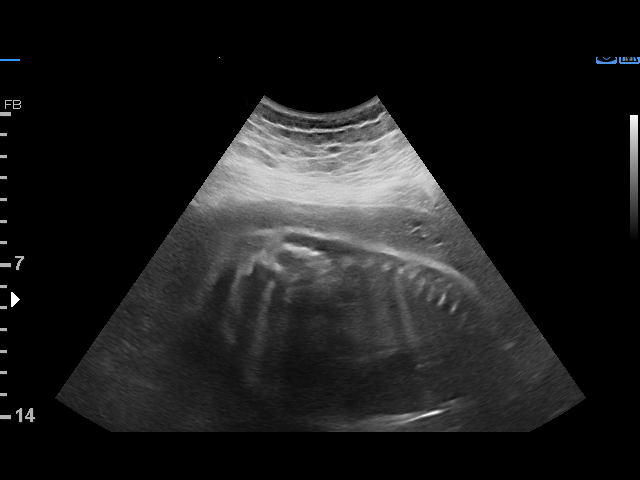
[im 9/27]
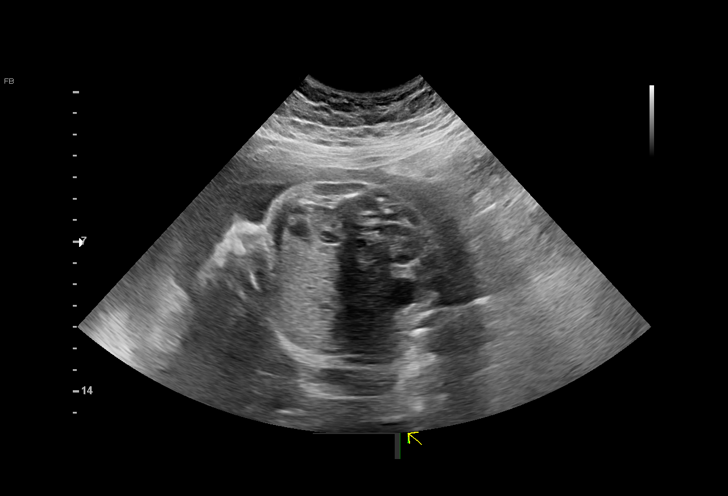
[im 11/27]
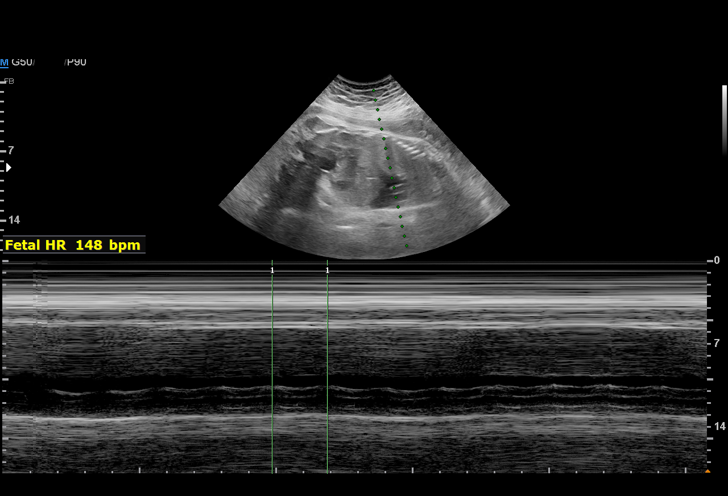
[im 13/27]
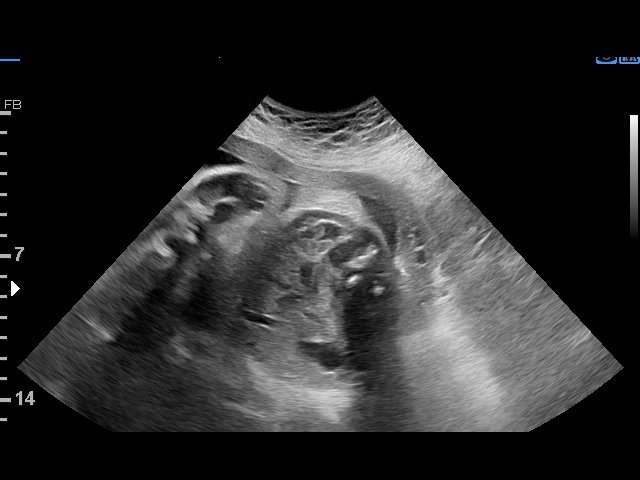
[im 15/27]
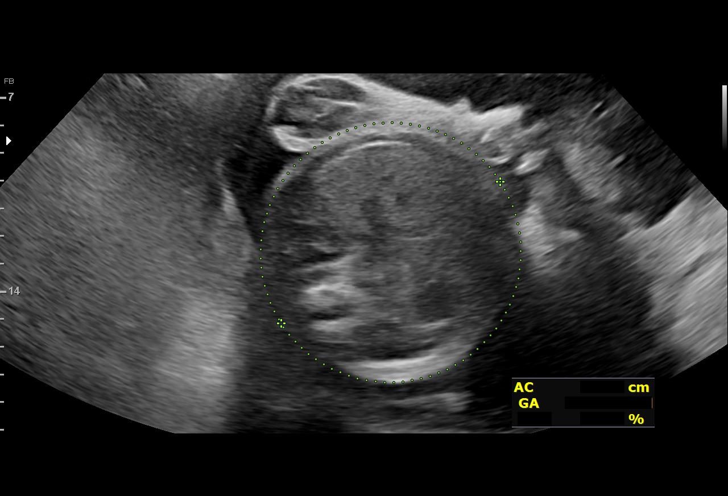
[im 17/27]
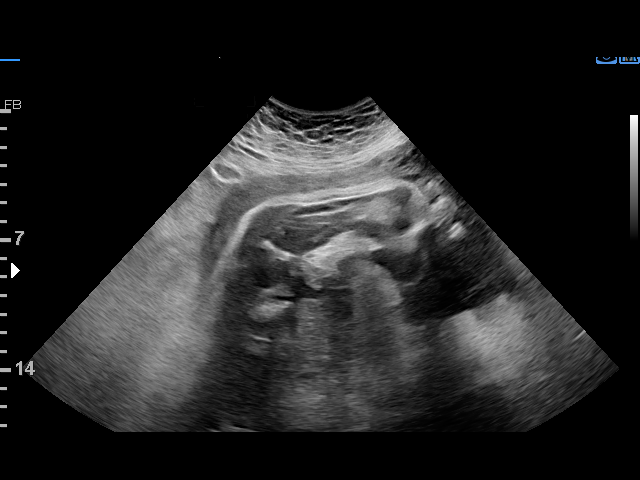
[im 19/27]
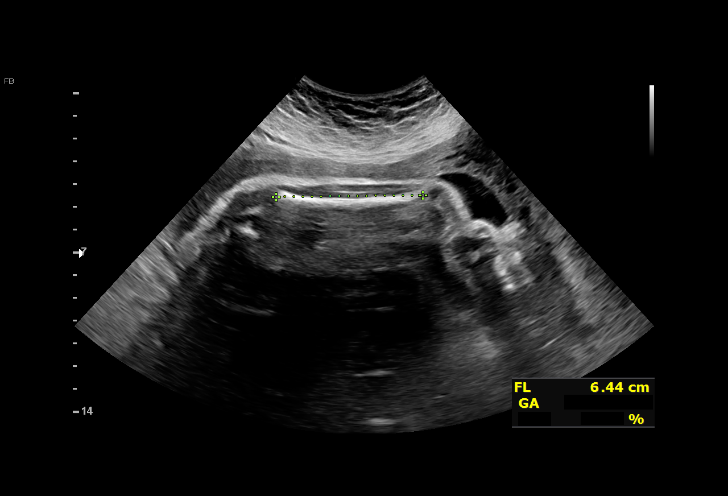
[im 21/27]
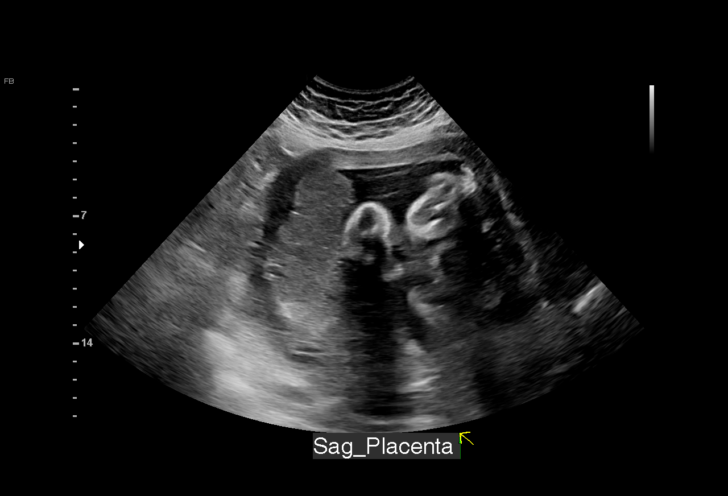
[im 23/27]
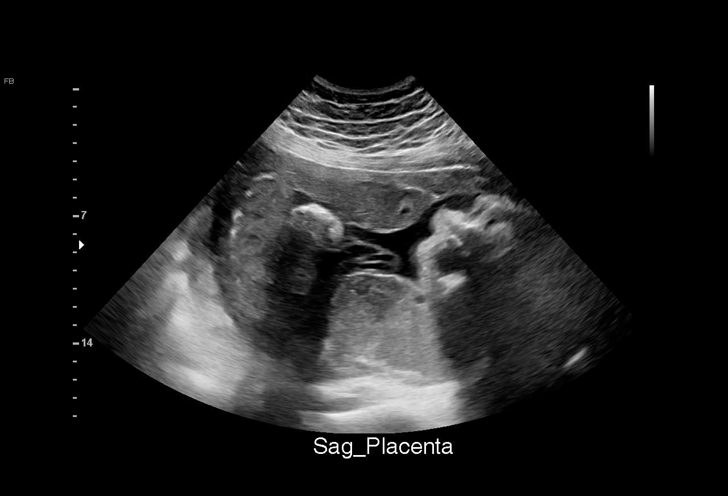
[im 25/27]
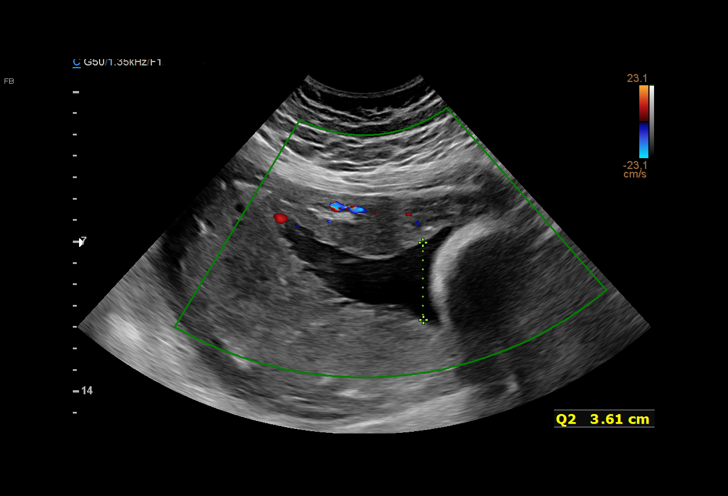
[im 27/27]
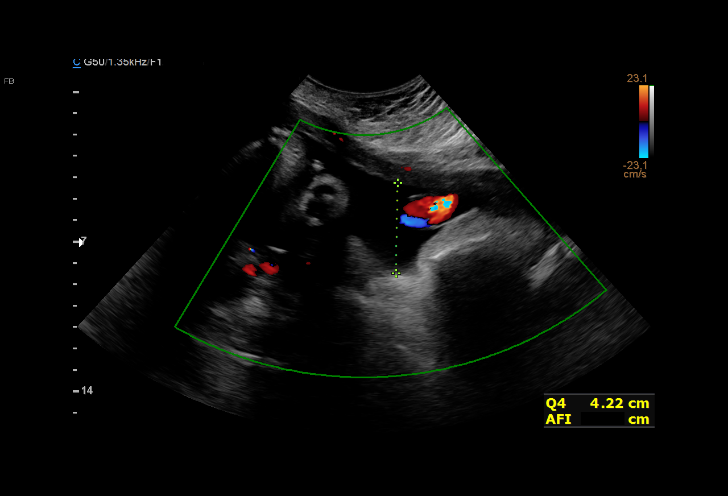

[14 of 27 positions shown; findings below may reference images not displayed]

OB/GYN

Indications

 Hypertension - Chronic/Pre-existing (dx        [PE]
 [DATE])
 Medical complication of pregnancy (Breast      [PE]
 Cancer in patient Dx [DATE])
 Obesity complicating pregnancy, second         [PE]
 trimester (pregravid BMI 45)
 31 weeks gestation of pregnancy
 Rh negative state in antepartum (Anti-D - too  [PE]
 weak to titer)
 Neg AFP; NIPS - no result
Fetal Evaluation

 Num Of Fetuses:         1
 Fetal Heart Rate(bpm):  148
 Cardiac Activity:       Observed
 Presentation:           Cephalic
 Placenta:               Posterior
 P. Cord Insertion:      Previously Visualized
 Amniotic Fluid
 AFI FV:      Within normal limits

 AFI Sum(cm)     %Tile       Largest Pocket(cm)
 14.5            50

 RUQ(cm)       RLQ(cm)       LUQ(cm)        LLQ(cm)

Biophysical Evaluation

 Amniotic F.V:   Pocket => 2 cm             F. Tone:        Observed
 F. Movement:    Observed                   Score:          [DATE]
 F. Breathing:   Observed
Biometry

 BPD:      83.6  mm     G. Age:  33w 5d         88  %    CI:        80.11   %    70 - 86
                                                         FL/HC:      21.9   %    19.1 -
 HC:      295.1  mm     G. Age:  32w 4d         32  %    HC/AC:      1.01        0.96 -
 AC:      293.5  mm     G. Age:  33w 2d         87  %    FL/BPD:     77.2   %    71 - 87
 FL:       64.5  mm     G. Age:  33w 2d         76  %    FL/AC:      22.0   %    20 - 24

 LV:        4.9  mm

 Est. FW:    [PE]  gm    4 lb 12 oz      83  %
OB History

 Gravidity:    5         Term:   3        Prem:   0        SAB:   0
 TOP:          1       Ectopic:  0        Living: 3
Gestational Age

 LMP:           31w 6d        Date:  [DATE]                 EDD:   [DATE]
 U/S Today:     33w 2d                                        EDD:   [DATE]
 Best:          31w 6d     Det. By:  LMP  ([DATE])          EDD:   [DATE]
Anatomy

 Diaphragm:             Appears normal         Kidneys:                Appear normal
 Stomach:               Appears normal, left   Bladder:                Appears normal
                        sided
Impression

 Breast cancer diagnosed in this pregnancy.  Patient
 completed the course of doxorubicin and cyclophosphamide
 and she received her last treatment yesterday.  Patient feels
 well without any side effects.

 Fetal growth is appropriate for gestational age .Amniotic fluid
 is normal and good fetal activity is seen .Antenatal testing is
 reassuring. BPP [DATE].

 I have reassured the patient of the findings.

 Since her chemotherapy has been stopped, patient can
 continue her pregnancy and delivery can be planned at 39
 weeks gestation.  If patient requires further treatment, plan for
 delivery at 37 weeks gestation or earlier may be appropriate.
 Chemotherapy should be stopped at least 3 to 4 weeks
 before delivery to prevent myelosuppression and newborn.
Recommendations

 Continue weekly BPP till delivery.
                 ANDERXON

## 2021-03-02 ENCOUNTER — Inpatient Hospital Stay: Payer: BC Managed Care – PPO

## 2021-03-02 ENCOUNTER — Other Ambulatory Visit: Payer: BC Managed Care – PPO

## 2021-03-02 ENCOUNTER — Other Ambulatory Visit: Payer: Self-pay

## 2021-03-02 ENCOUNTER — Ambulatory Visit: Payer: BC Managed Care – PPO | Attending: Obstetrics and Gynecology

## 2021-03-02 ENCOUNTER — Ambulatory Visit: Payer: BC Managed Care – PPO | Admitting: *Deleted

## 2021-03-02 VITALS — BP 134/84 | HR 104

## 2021-03-02 VITALS — BP 146/93 | HR 101 | Temp 98.3°F | Resp 18 | Wt 295.0 lb

## 2021-03-02 DIAGNOSIS — O10913 Unspecified pre-existing hypertension complicating pregnancy, third trimester: Secondary | ICD-10-CM

## 2021-03-02 DIAGNOSIS — C50411 Malignant neoplasm of upper-outer quadrant of right female breast: Secondary | ICD-10-CM

## 2021-03-02 DIAGNOSIS — O10013 Pre-existing essential hypertension complicating pregnancy, third trimester: Secondary | ICD-10-CM | POA: Diagnosis not present

## 2021-03-02 DIAGNOSIS — C50919 Malignant neoplasm of unspecified site of unspecified female breast: Secondary | ICD-10-CM | POA: Insufficient documentation

## 2021-03-02 DIAGNOSIS — Z95828 Presence of other vascular implants and grafts: Secondary | ICD-10-CM

## 2021-03-02 DIAGNOSIS — Z3A32 32 weeks gestation of pregnancy: Secondary | ICD-10-CM | POA: Diagnosis not present

## 2021-03-02 DIAGNOSIS — Z5111 Encounter for antineoplastic chemotherapy: Secondary | ICD-10-CM | POA: Diagnosis not present

## 2021-03-02 IMAGING — US US MFM FETAL BPP W/O NON-STRESS
1 series · 10 of 10 positions shown · non-contrast
Comparison: none

[Series 1: us mfm fetal bpp w/o non-stress · 10 acquisitions, 10 frames shown]
[im 1/10]
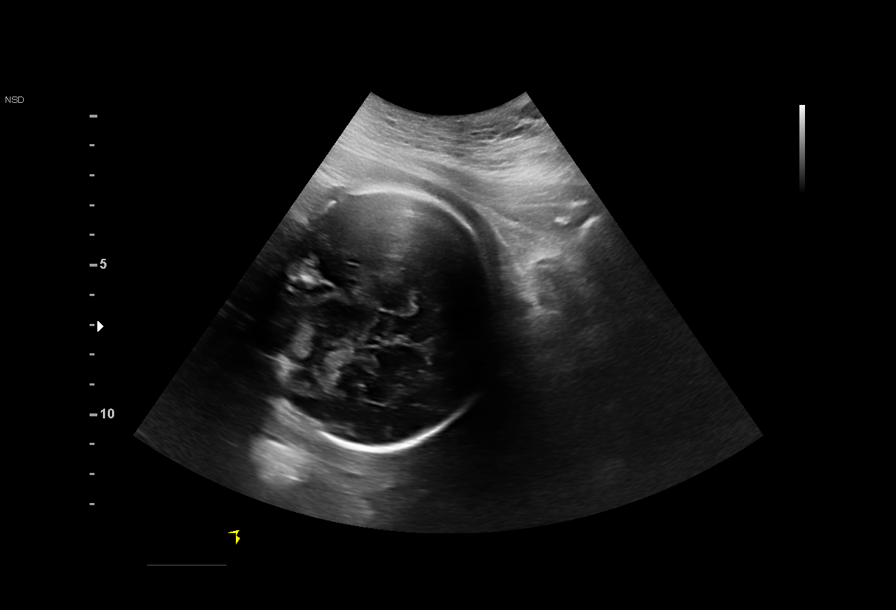
[im 2/10]
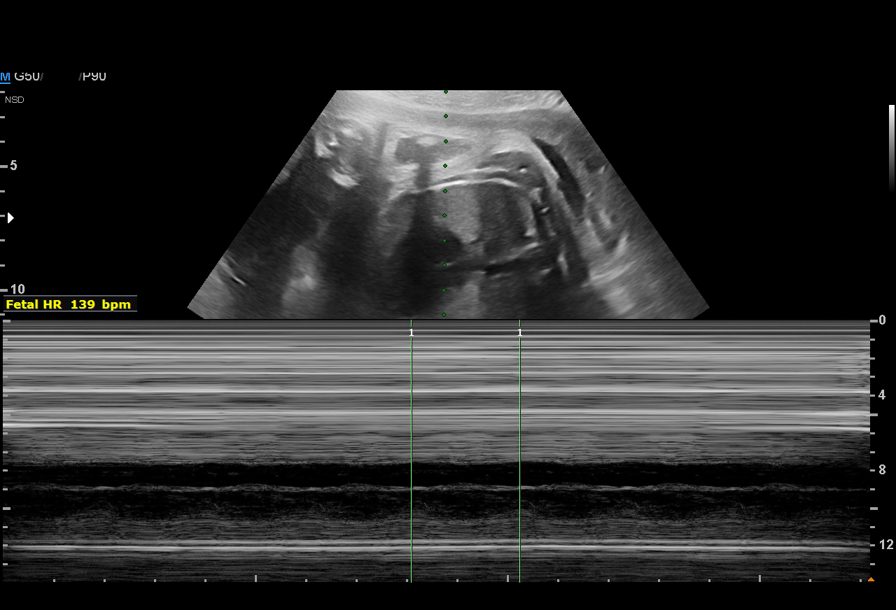
[im 3/10]
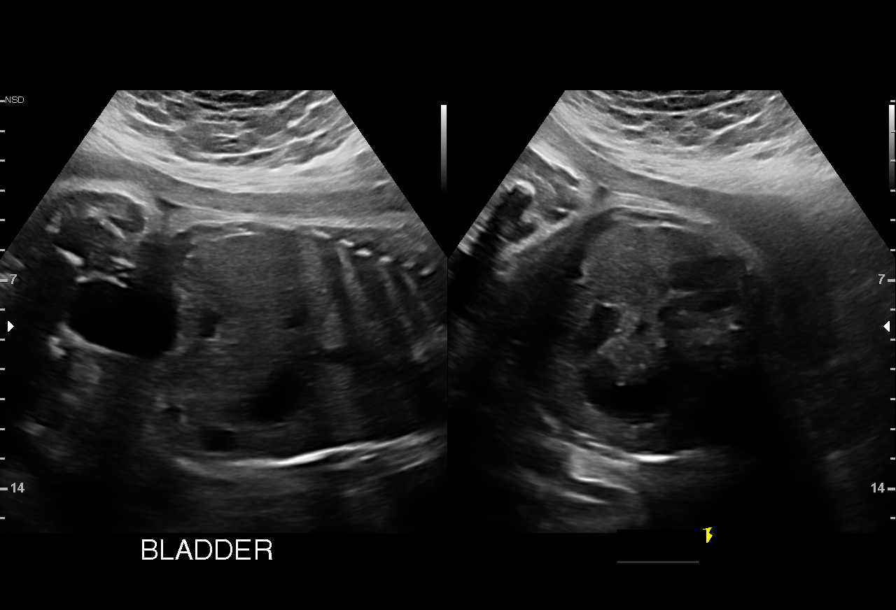
[im 4/10]
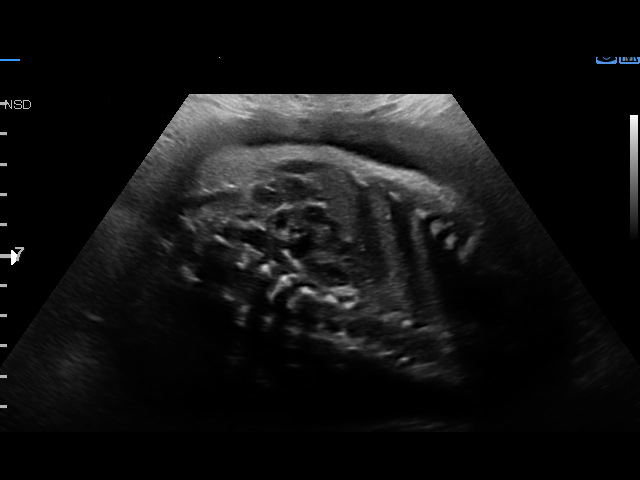
[im 5/10]
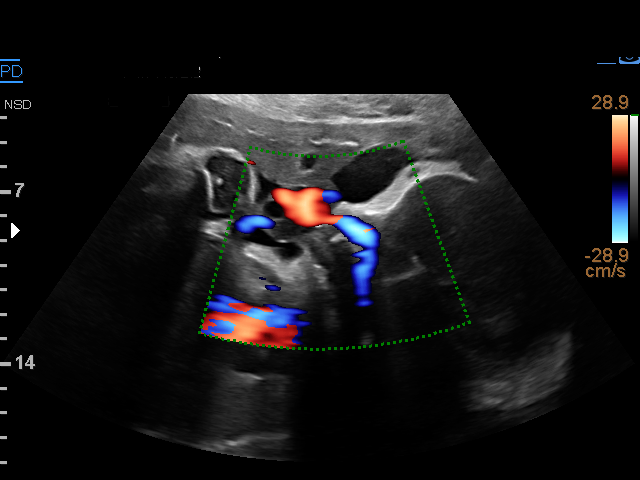
[im 6/10]
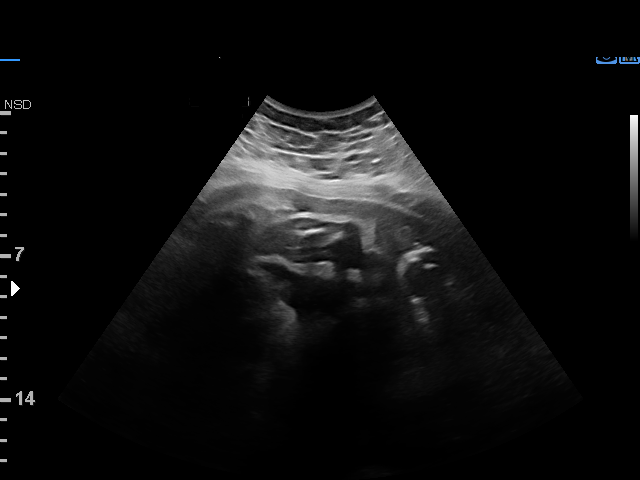
[im 7/10]
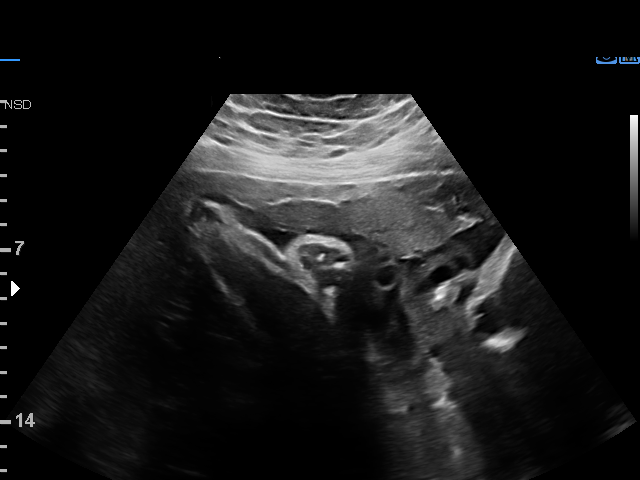
[im 8/10]
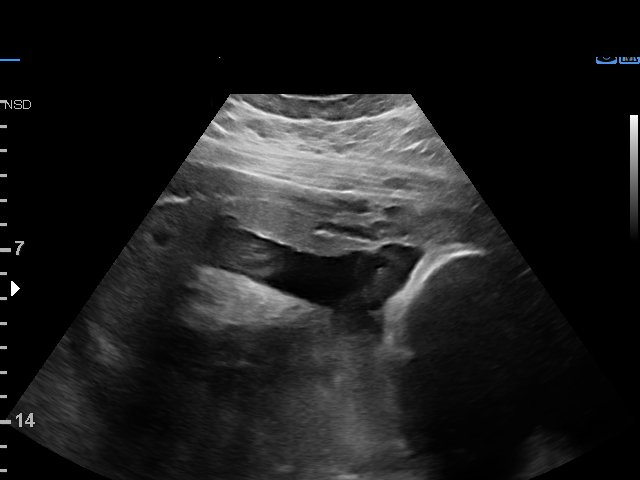
[im 9/10]
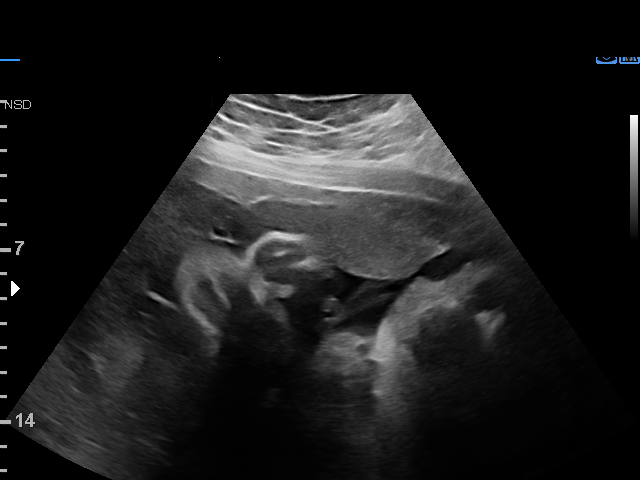
[im 10/10]
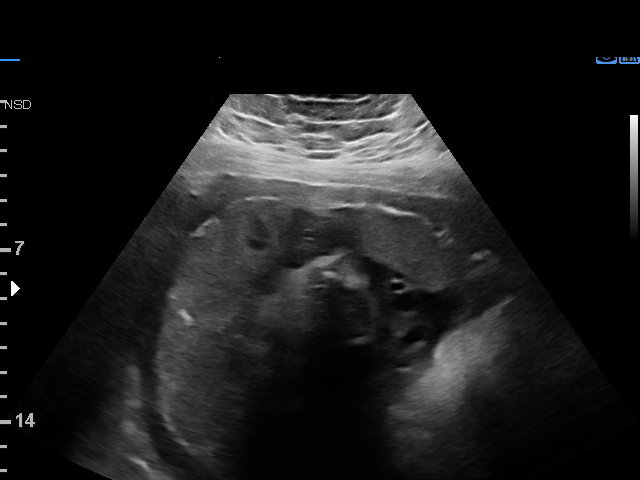

[10 of 10 positions shown; findings below may reference images not displayed]

OB/GYN

Indications

 Hypertension - Chronic/Pre-existing (dx        [01]
 [DATE])
 Medical complication of pregnancy (Breast      [01]
 Cancer in patient Dx [DATE])
 32 weeks gestation of pregnancy
 Obesity complicating pregnancy, third          [01]
 trimester (BMI 45)
 Rh negative state in antepartum (Anti-D - too  [01]
 weak to titer)
 Neg AFP; NIPS - no result
Fetal Evaluation

 Num Of Fetuses:         1
 Fetal Heart Rate(bpm):  139
 Cardiac Activity:       Observed
 Presentation:           Cephalic

 Amniotic Fluid
 AFI FV:      Within normal limits

 AFI Sum(cm)     %Tile       Largest Pocket(cm)
 14.52           51

 RUQ(cm)       RLQ(cm)       LUQ(cm)        LLQ(cm)

Biophysical Evaluation

 Amniotic F.V:   Within normal limits       F. Tone:        Observed
 F. Movement:    Observed                   Score:          [DATE]
 F. Breathing:   Observed
OB History

 Gravidity:    5         Term:   3        Prem:   0        SAB:   0
 TOP:          1       Ectopic:  0        Living: 3
Gestational Age

 LMP:           32w 5d        Date:  [DATE]                 EDD:   [DATE]
 Best:          32w 5d     Det. By:  LMP  ([DATE])          EDD:   [DATE]
Impression

 Intraductal carcinoma.  Patient completed a course of
 chemotherapy.  She will be undergoing mastectomy surgery
 around [DATE].
 Patient will be receiving a second course of iron transfusion
 today.

 Amniotic fluid is normal and good fetal activity seen.
 Antenatal testing is reassuring.  BPP [DATE].  Cephalic
 presentation.

 I reassured the patient of the findings.
Recommendations

 - Continue weekly BPP till delivery.
 -Delivery at 39 weeks gestation.
                 LASHES

## 2021-03-02 MED ORDER — HEPARIN SOD (PORK) LOCK FLUSH 100 UNIT/ML IV SOLN: 500.0000 [IU] | Freq: Once | INTRAVENOUS | Status: DC | PRN

## 2021-03-02 MED ORDER — SODIUM CHLORIDE 0.9% FLUSH: 10.0000 mL | Freq: Once | INTRAVENOUS | Status: DC | PRN

## 2021-03-02 MED ORDER — SODIUM CHLORIDE 0.9 % IV SOLN: Freq: Once | INTRAVENOUS | Status: AC

## 2021-03-02 MED ORDER — HEPARIN SOD (PORK) LOCK FLUSH 100 UNIT/ML IV SOLN: 500.0000 [IU] | Freq: Once | INTRAVENOUS | Status: DC

## 2021-03-02 MED ORDER — SODIUM CHLORIDE 0.9 % IV SOLN
300.0000 mg | Freq: Once | INTRAVENOUS | Status: AC
  Administered 2021-03-02: 300 mg via INTRAVENOUS
  Filled 2021-03-02: qty 300

## 2021-03-02 NOTE — Patient Instructions (Signed)
Iron Sucrose Injection What is this medication? IRON SUCROSE (EYE ern SOO krose) treats low levels of iron (iron deficiency anemia) in people with kidney disease. Iron is a mineral that plays an important role in making red blood cells, which carry oxygen from your lungs to the rest of your body. This medicine may be used for other purposes; ask your health care provider or pharmacist if you have questions. COMMON BRAND NAME(S): Venofer What should I tell my care team before I take this medication? They need to know if you have any of these conditions: Anemia not caused by low iron levels Heart disease High levels of iron in the blood Kidney disease Liver disease An unusual or allergic reaction to iron, other medications, foods, dyes, or preservatives Pregnant or trying to get pregnant Breast-feeding How should I use this medication? This medication is for infusion into a vein. It is given in a hospital or clinic setting. Talk to your care team about the use of this medication in children. While this medication may be prescribed for children as young as 2 years for selected conditions, precautions do apply. Overdosage: If you think you have taken too much of this medicine contact a poison control center or emergency room at once. NOTE: This medicine is only for you. Do not share this medicine with others. What if I miss a dose? It is important not to miss your dose. Call your care team if you are unable to keep an appointment. What may interact with this medication? Do not take this medication with any of the following: Deferoxamine Dimercaprol Other iron products This medication may also interact with the following: Chloramphenicol Deferasirox This list may not describe all possible interactions. Give your health care provider a list of all the medicines, herbs, non-prescription drugs, or dietary supplements you use. Also tell them if you smoke, drink alcohol, or use illegal drugs.  Some items may interact with your medicine. What should I watch for while using this medication? Visit your care team regularly. Tell your care team if your symptoms do not start to get better or if they get worse. You may need blood work done while you are taking this medication. You may need to follow a special diet. Talk to your care team. Foods that contain iron include: whole grains/cereals, dried fruits, beans, or peas, leafy green vegetables, and organ meats (liver, kidney). What side effects may I notice from receiving this medication? Side effects that you should report to your care team as soon as possible: Allergic reactions--skin rash, itching, hives, swelling of the face, lips, tongue, or throat Low blood pressure--dizziness, feeling faint or lightheaded, blurry vision Shortness of breath Side effects that usually do not require medical attention (report to your care team if they continue or are bothersome): Flushing Headache Joint pain Muscle pain Nausea Pain, redness, or irritation at injection site This list may not describe all possible side effects. Call your doctor for medical advice about side effects. You may report side effects to FDA at 1-800-FDA-1088. Where should I keep my medication? This medication is given in a hospital or clinic and will not be stored at home. NOTE: This sheet is a summary. It may not cover all possible information. If you have questions about this medicine, talk to your doctor, pharmacist, or health care provider.  2022 Elsevier/Gold Standard (2020-06-04 00:00:00)  [facility]  Discharge Instructions: Thank you for choosing [facility] to provide your oncology and  hematology care.   If you have a lab appointment with the [facility], please go directly to the [facility] and check in at the registration area.   Wear comfortable clothing and clothing appropriate for easy access to any Portacath  or PICC line.   We strive to give you quality time with your provider. You may need to reschedule your appointment if you arrive late (15 or more minutes).  Arriving late affects you and other patients whose appointments are after yours.  Also, if you miss three or more appointments without notifying the office, you may be dismissed from the clinic at the providers discretion.      For prescription refill requests, have your pharmacy contact our office and allow 72 hours for refills to be completed.    Today you received the following chemotherapy and/or immunotherapy agents: Adriamycin & Cytoxan    To help prevent nausea and vomiting after your treatment, we encourage you to take your nausea medication as directed.  BELOW ARE SYMPTOMS THAT SHOULD BE REPORTED IMMEDIATELY: *FEVER GREATER THAN 100.4 F (38 C) OR HIGHER *CHILLS OR SWEATING *NAUSEA AND VOMITING THAT IS NOT CONTROLLED WITH YOUR NAUSEA MEDICATION *UNUSUAL SHORTNESS OF BREATH *UNUSUAL BRUISING OR BLEEDING *URINARY PROBLEMS (pain or burning when urinating, or frequent urination) *BOWEL PROBLEMS (unusual diarrhea, constipation, pain near the anus) TENDERNESS IN MOUTH AND THROAT WITH OR WITHOUT PRESENCE OF ULCERS (sore throat, sores in mouth, or a toothache) UNUSUAL RASH, SWELLING OR PAIN  UNUSUAL VAGINAL DISCHARGE OR ITCHING   Items with * indicate a potential emergency and should be followed up as soon as possible or go to the Emergency Department if any problems should occur.  Please show the CHEMOTHERAPY ALERT CARD or IMMUNOTHERAPY ALERT CARD at check-in to the Emergency Department and triage nurse.  Should you have questions after your visit or need to cancel or reschedule your appointment, please contact [facility]  Dept: [phone]  and follow the prompts.  Office hours are 8:00 a.m. to 4:30 p.m. Monday - Friday. Please note that voicemails left after 4:00 p.m. may not be returned until the  following business day.  We are closed weekends and major holidays. You have access to a nurse at all times for urgent questions. Please call the main number to the clinic Dept: [phone] and follow the prompts.   For any non-urgent questions, you may also contact your provider using MyChart. We now offer e-Visits for anyone 45 and older to request care online for non-urgent symptoms. For details visit [email or web address].   Also download the MyChart app! Go to the app store, search "MyChart", open the app, select [facility], and log in with your MyChart username and password.  Due to Covid, a mask is required upon entering the hospital/clinic. If you do not have a mask, one will be given to you upon arrival. For doctor visits, patients may have 1 support person aged 87 or older with them. For treatment visits, patients cannot have anyone with them due to current Covid guidelines and our immunocompromised population.

## 2021-03-07 ENCOUNTER — Encounter: Payer: Self-pay | Admitting: Hematology and Oncology

## 2021-03-09 ENCOUNTER — Other Ambulatory Visit: Payer: Self-pay

## 2021-03-09 ENCOUNTER — Inpatient Hospital Stay (HOSPITAL_BASED_OUTPATIENT_CLINIC_OR_DEPARTMENT_OTHER): Payer: BC Managed Care – PPO | Admitting: Adult Health

## 2021-03-09 ENCOUNTER — Encounter: Payer: Self-pay | Admitting: Adult Health

## 2021-03-09 VITALS — BP 145/98 | HR 102 | Temp 97.9°F | Resp 18 | Ht 64.0 in | Wt 295.0 lb

## 2021-03-09 DIAGNOSIS — C50411 Malignant neoplasm of upper-outer quadrant of right female breast: Secondary | ICD-10-CM | POA: Diagnosis not present

## 2021-03-09 DIAGNOSIS — Z5111 Encounter for antineoplastic chemotherapy: Secondary | ICD-10-CM | POA: Diagnosis not present

## 2021-03-09 DIAGNOSIS — Z170 Estrogen receptor positive status [ER+]: Secondary | ICD-10-CM

## 2021-03-09 NOTE — Assessment & Plan Note (Addendum)
12/08/2020:21-week pregnancy: MRI at [facility] noncontrast revealed right axillary mass 3.7 cm, subpectoral lymph nodes, left supraclavicular lymph node, multiple level 2 and 3 lymph nodes in the right axilla, 2 breast masses 1.1 cm largest size. 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 Neoadjuvant chemotherapy with Adriamycin and Cytoxan every 3 weeks x4started 12/24/2020 2. delivery of the baby 3. Continue neoadjuvant therapy with Taxol Herceptin and Perjeta 4. Mastectomy with ALND 5. Adjuvant radiation therapy 6. Adjuvant antiestrogen therapy with ovarian function suppression Genetic testing ---------------------------------------------------------------------------------------------------------------------------------- Current Treatment:  Evelyn Marshall has completed her first 4 cycles of Adriamycin and Cytoxan.  She completed this therapy on February 23, 2021.  I am concerned that now that she is finished her chemotherapy her cancer is progressing again in her breast.  I discussed this with Dr. Lindi Adie she will go back and see surgery for evaluation.  She will also undergo ultrasound of the breast to evaluate for the state of progression in the breast.  We discussed her pregnancy.  If she can safely be induced at 36 weeks, that would be preferential so that she can undergo further cancer treatment.  She is eligible to receive Taxol alone that is safe in pregnancy.  We will await ultrasound and after ultrasound she will follow-up with Dr. Lindi Adie.

## 2021-03-09 NOTE — Progress Notes (Signed)
Evelyn Cancer Follow up:    Evelyn Marshall, Evelyn Marshall   DIAGNOSIS:  Cancer Staging  Malignant neoplasm of upper-outer quadrant of right breast in female, estrogen receptor positive ([place]) Staging form: Breast, AJCC 8th Edition - Clinical: Stage IV (cT3, cN2, pM1, G2, ER+, PR-, HER2+) - Signed by Nicholas Lose, MD on 12/15/2020 Histologic grading system: 3 grade system   SUMMARY OF ONCOLOGIC HISTORY: Oncology History  Malignant neoplasm of upper-outer quadrant of right breast in female, estrogen receptor positive ([place])  12/08/2020 Initial Diagnosis   21-week pregnancy: MRI at [facility] noncontrast revealed right axillary mass 3.7 cm, subpectoral lymph nodes, left supraclavicular lymph node, multiple level 2 and 3 lymph nodes in the right axilla, 2 breast masses 1.1 cm largest size.  Mammogram in [place] revealed 3 small masses at 12:00 1.2 cm, 0.6 cm, 0.9 cm and 5 abnormal axillary lymph nodes, pleomorphic calcifications measuring 10 cm, biopsy revealed grade 2 IDC with necrosis and calcifications ER 30%, PR 0%, HER2 positive, Ki-67 30 to 40%   12/15/2020 Cancer Staging   Staging form: Breast, AJCC 8th Edition - Clinical: Stage IV (cT3, cN2, pM1, G2, ER+, PR-, HER2+) - Signed by Nicholas Lose, MD on 12/15/2020 Histologic grading system: 3 grade system    12/24/2020 -  Chemotherapy   Patient is on Treatment Plan : BREAST Adjuvant AC q21d     12/29/2020 Genetic Testing   Negative hereditary cancer genetic testing: no pathogenic variants detected in [facility] BRCAPlus Panel or [facility] CustomNext-Cancer +RNAinsight Panel.  The report dates are 12/23/2020 and 12/29/2020.   The BRCAplus panel offered by [employer] and includes sequencing and deletion/duplication analysis for the following 8 genes: ATM, BRCA1, BRCA2, CDH1, CHEK2, PALB2, PTEN, and TP53.  The CustomNext-Cancer+RNAinsight panel offered by [facility] includes sequencing and rearrangement  analysis for the following 47 genes:  APC, ATM, AXIN2, BARD1, BMPR1A, BRCA1, BRCA2, BRIP1, CDH1, CDK4, CDKN2A, CHEK2, DICER1, EPCAM, GREM1, HOXB13, MEN1, MLH1, MSH2, MSH3, MSH6, MUTYH, NBN, NF1, NF2, NTHL1, PALB2, PMS2, POLD1, POLE, PTEN, RAD51C, RAD51D, RECQL, RET, SDHA, SDHAF2, SDHB, SDHC, SDHD, SMAD4, SMARCA4, STK11, TP53, TSC1, TSC2, and VHL.  RNA data is routinely analyzed for use in variant interpretation for all genes.     CURRENT THERAPY: Status post Adriamycin and Cytoxan  INTERVAL HISTORY: Evelyn Marshall 36 y.o. female returns for evaluation for new breast mass that she has palpated in her right upper outer quadrant.  She finished her neoadjuvant chemotherapy a few weeks ago.  She is also pregnant and is [redacted] weeks along.  She notes that they are considering induction at 36 weeks due to her high risk status and history of preeclampsia.  She is concerned that her cancer is possibly growing back in her right breast and wants to know what the plan will be.   Patient Active Problem List   Diagnosis Date Noted   Port-A-Cath in place 12/31/2020   Genetic testing 12/27/2020   Malignant neoplasm of upper-outer quadrant of right breast in female, estrogen receptor positive (Evelyn Marshall) 12/15/2020   Family history of breast cancer 12/15/2020   Severe preeclampsia, third trimester 02/25/2019   Hyperemesis gravidarum 08/20/2018   Preeclampsia, third trimester 11/15/2015   Pre-eclampsia during pregnancy in third trimester, antepartum 11/15/2015   Preeclampsia 11/15/2015   Mild preeclampsia 11/12/2015   Hypokalemia, inadequate intake 06/17/2015   Ptyalism 05/18/2015   Chronic hypertension in pregnancy 05/18/2015   Morbid obesity with BMI of 40.0-44.9, adult ([place])  05/18/2015   Rh negative, maternal 05/18/2015    has No Known Allergies.  MEDICAL HISTORY: Past Medical History:  Diagnosis Date   Abnormal Pap smear 2012   colpo result benign   Cancer ([place])    Chlamydia    Family history of breast  cancer 12/15/2020   History of pre-eclampsia    HPV (human papilloma virus) infection    Hyperemesis    Hypokalemia    Pregnancy induced hypertension    Ptyalism    Vaginal Pap smear, abnormal     SURGICAL HISTORY: Past Surgical History:  Procedure Laterality Date   COLPOSCOPY     IR IMAGING GUIDED PORT INSERTION  12/24/2020   WISDOM TOOTH EXTRACTION      SOCIAL HISTORY: Social History   Socioeconomic History   Marital status: Married    Spouse name: Not on file   Number of children: Not on file   Years of education: 16   Highest education level: Bachelor's degree (e.g., BA, AB, BS)  Occupational History   Occupation: Pharmacist, hospital  Tobacco Use   Smoking status: Never   Smokeless tobacco: Never  Vaping Use   Vaping Use: Never used  Substance and Sexual Activity   Alcohol use: No   Drug use: No   Sexual activity: Yes  Other Topics Concern   Not on file  Social History Narrative   Not on file   Social Determinants of Health   Financial Resource Strain: Not on file  Food Insecurity: Not on file  Transportation Needs: Not on file  Physical Activity: Not on file  Stress: Not on file  Social Connections: Not on file  Intimate Partner Violence: Not on file    FAMILY HISTORY: Family History  Problem Relation Age of Onset   Heart disease Father    Hypertension Father    Stroke Father    Breast cancer Paternal Grandmother        dx 70s   Stroke Paternal Grandmother    Breast cancer Cousin 46       maternal female cousin    Review of Systems  Constitutional:  Positive for fatigue. Negative for appetite change, chills, fever and unexpected weight change.  HENT:   Negative for hearing loss, lump/mass and trouble swallowing.   Eyes:  Negative for eye problems and icterus.  Respiratory:  Negative for chest tightness, cough and shortness of breath.   Cardiovascular:  Negative for chest pain, leg swelling and palpitations.  Gastrointestinal:  Negative for abdominal  distention, abdominal pain, constipation, diarrhea, nausea and vomiting.  Endocrine: Negative for hot flashes.  Genitourinary:  Negative for difficulty urinating.   Musculoskeletal:  Negative for arthralgias.  Skin:  Negative for itching and rash.  Neurological:  Negative for dizziness, extremity weakness, headaches and numbness.  Hematological:  Negative for adenopathy. Does not bruise/bleed easily.  Psychiatric/Behavioral:  Negative for depression. The patient is not nervous/anxious.      PHYSICAL EXAMINATION  ECOG PERFORMANCE STATUS: 1 - Symptomatic but completely ambulatory  Vitals:   03/09/21 1147  BP: (!) 145/98  Pulse: (!) 102  Resp: 18  Temp: 97.9 F (36.6 C)  SpO2: 100%    Physical Exam Constitutional:      General: She is not in acute distress.    Appearance: Normal appearance. She is not toxic-appearing.  HENT:     Head: Normocephalic and atraumatic.  Eyes:     General: No scleral icterus. Cardiovascular:     Rate and Rhythm: Normal rate and  regular rhythm.     Pulses: Normal pulses.     Heart sounds: Normal heart sounds.  Pulmonary:     Effort: Pulmonary effort is normal.     Breath sounds: Normal breath sounds.  Chest:     Comments: Right breast with masses present, on palpation the one in the right upper outer quadrant feels concerning Abdominal:     General: Abdomen is flat. Bowel sounds are normal. There is no distension.     Palpations: Abdomen is soft.     Tenderness: There is no abdominal tenderness.  Musculoskeletal:        General: No swelling.     Cervical back: Neck supple.  Lymphadenopathy:     Cervical: No cervical adenopathy.  Skin:    General: Skin is warm and dry.     Findings: No rash.  Neurological:     General: No focal deficit present.     Mental Status: She is alert.  Psychiatric:        Mood and Affect: Mood normal.        Behavior: Behavior normal.    LABORATORY DATA:  CBC    Component Value Date/Time   WBC 5.2  02/23/2021 1151   WBC 1.4 (LL) 01/26/2021 1926   RBC 3.60 (L) 02/23/2021 1151   HGB 8.7 (L) 02/23/2021 1151   HCT 27.2 (L) 02/23/2021 1151   PLT 383 02/23/2021 1151   MCV 75.6 (L) 02/23/2021 1151   MCH 24.2 (L) 02/23/2021 1151   MCHC 32.0 02/23/2021 1151   RDW 19.7 (H) 02/23/2021 1151   LYMPHSABS 0.4 (L) 02/23/2021 1151   MONOABS 0.9 02/23/2021 1151   EOSABS 0.0 02/23/2021 1151   BASOSABS 0.0 02/23/2021 1151    CMP     Component Value Date/Time   NA 135 02/23/2021 1151   K 3.5 02/23/2021 1151   CL 104 02/23/2021 1151   CO2 24 02/23/2021 1151   GLUCOSE 100 (H) 02/23/2021 1151   BUN 5 (L) 02/23/2021 1151   CREATININE 0.45 02/23/2021 1151   CALCIUM 9.0 02/23/2021 1151   PROT 6.5 02/23/2021 1151   ALBUMIN 3.3 (L) 02/23/2021 1151   AST 21 02/23/2021 1151   ALT 16 02/23/2021 1151   ALKPHOS 123 02/23/2021 1151   BILITOT 0.2 (L) 02/23/2021 1151   GFRNONAA >60 02/23/2021 1151   GFRAA >60 02/28/2019 0613       ASSESSMENT and THERAPY PLAN:   Malignant neoplasm of upper-outer quadrant of right breast in female, estrogen receptor positive ([place]) 12/08/2020:21-week pregnancy: MRI at [facility] noncontrast revealed right axillary mass 3.7 cm, subpectoral lymph nodes, left supraclavicular lymph node, multiple level 2 and 3 lymph nodes in the right axilla, 2 breast masses 1.1 cm largest size.  Mammogram in [place] revealed 3 small masses at 12:00 1.2 cm, 0.6 cm, 0.9 cm and 5 abnormal axillary lymph nodes, pleomorphic calcifications measuring 10 cm, biopsy revealed grade 2 IDC with necrosis and calcifications ER 30%, PR 0%, HER2 positive, Ki-67 30 to 40%   Treatment plan: 1.  Neoadjuvant chemotherapy with Adriamycin and Cytoxan every 3 weeks x4 started 12/24/2020 2. delivery of the baby 3.  Continue neoadjuvant therapy with Taxol Herceptin and Perjeta 4.  Mastectomy with ALND 5.  Adjuvant radiation therapy 6.  Adjuvant antiestrogen therapy with ovarian function suppression Genetic  testing ---------------------------------------------------------------------------------------------------------------------------------- Current Treatment:  Claryssa has completed her first 4 cycles of Adriamycin and Cytoxan.  She completed this therapy on February 23, 2021.  I am concerned that now  that she is finished her chemotherapy her cancer is progressing again in her breast.  I discussed this with Dr. Lindi Adie she will go back and see surgery for evaluation.  She will also undergo ultrasound of the breast to evaluate for the state of progression in the breast.  We discussed her pregnancy.  If she can safely be induced at 36 weeks, that would be preferential so that she can undergo further cancer treatment.  She is eligible to receive Taxol alone that is safe in pregnancy.  We will await ultrasound and after ultrasound she will follow-up with Dr. Lindi Adie.    All questions were answered. The patient knows to call the clinic with any problems, questions or concerns. We can certainly see the patient much sooner if necessary.  Total encounter time: 20 minutes in face-to-face visit time, chart review, lab review, care coordination, and documentation of the encounter.   Wilber Bihari, NP 03/09/21 7:55 PM Medical Oncology and Hematology [facility] [place], [place] [address] Tel. [phone]    Fax. [phone]  *Total Encounter Time as defined by the Centers for Medicare and Medicaid Services includes, in addition to the face-to-face time of a patient visit (documented in the note above) non-face-to-face time: obtaining and reviewing outside history, ordering and reviewing medications, tests or procedures, care coordination (communications with other health care professionals or caregivers) and documentation in the medical record.

## 2021-03-10 ENCOUNTER — Ambulatory Visit: Payer: BC Managed Care – PPO | Attending: Obstetrics | Admitting: Obstetrics

## 2021-03-10 ENCOUNTER — Encounter: Payer: Self-pay | Admitting: *Deleted

## 2021-03-10 ENCOUNTER — Encounter: Payer: Self-pay | Admitting: Hematology and Oncology

## 2021-03-10 ENCOUNTER — Ambulatory Visit: Payer: BC Managed Care – PPO | Admitting: *Deleted

## 2021-03-10 ENCOUNTER — Ambulatory Visit: Payer: BC Managed Care – PPO | Attending: Obstetrics and Gynecology

## 2021-03-10 VITALS — BP 133/88 | HR 100

## 2021-03-10 DIAGNOSIS — O9A113 Malignant neoplasm complicating pregnancy, third trimester: Secondary | ICD-10-CM

## 2021-03-10 DIAGNOSIS — C50919 Malignant neoplasm of unspecified site of unspecified female breast: Secondary | ICD-10-CM | POA: Insufficient documentation

## 2021-03-10 DIAGNOSIS — O10013 Pre-existing essential hypertension complicating pregnancy, third trimester: Secondary | ICD-10-CM | POA: Diagnosis not present

## 2021-03-10 DIAGNOSIS — Z3A34 34 weeks gestation of pregnancy: Secondary | ICD-10-CM | POA: Diagnosis not present

## 2021-03-10 DIAGNOSIS — Z3A33 33 weeks gestation of pregnancy: Secondary | ICD-10-CM | POA: Diagnosis not present

## 2021-03-10 DIAGNOSIS — O09293 Supervision of pregnancy with other poor reproductive or obstetric history, third trimester: Secondary | ICD-10-CM | POA: Diagnosis present

## 2021-03-10 DIAGNOSIS — O10913 Unspecified pre-existing hypertension complicating pregnancy, third trimester: Secondary | ICD-10-CM | POA: Diagnosis not present

## 2021-03-10 DIAGNOSIS — O99213 Obesity complicating pregnancy, third trimester: Secondary | ICD-10-CM

## 2021-03-10 IMAGING — US US MFM FETAL BPP W/O NON-STRESS
1 series · 15 of 26 positions shown · non-contrast
Comparison: none

[Series 1: us mfm fetal bpp w/o non-stress · 26 acquisitions, 15 frames shown]
[im 1/26]
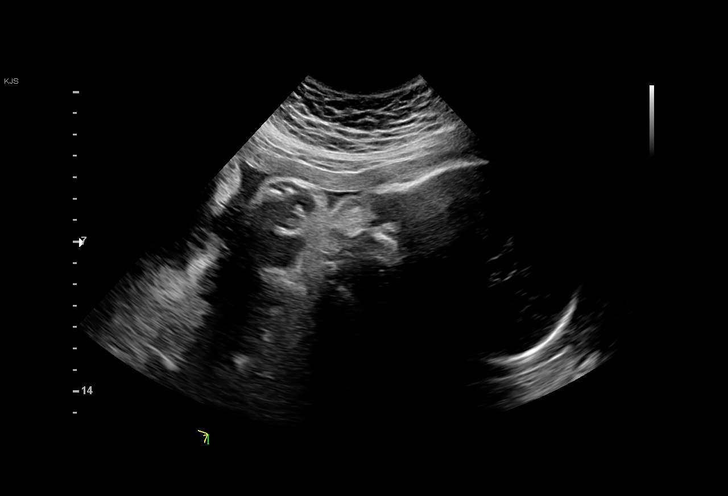
[im 3/26]
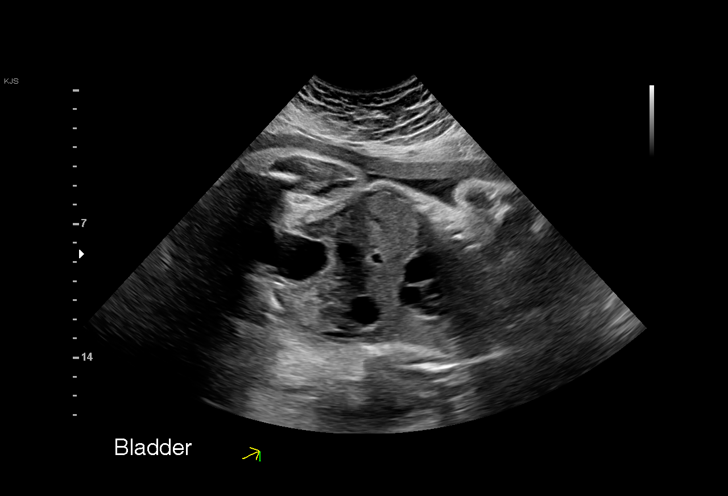
[im 5/26]
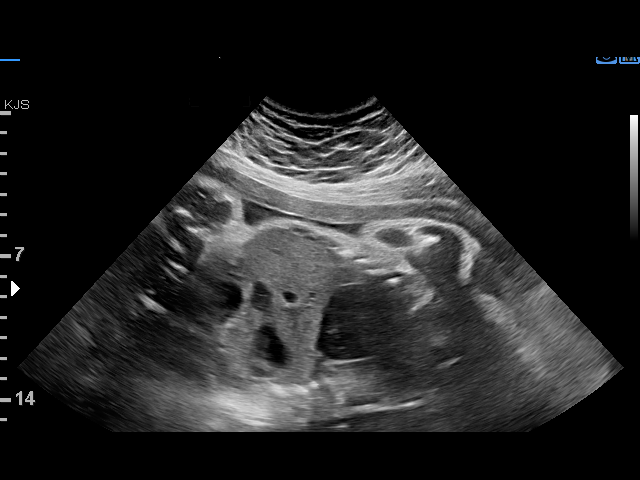
[im 7/26]
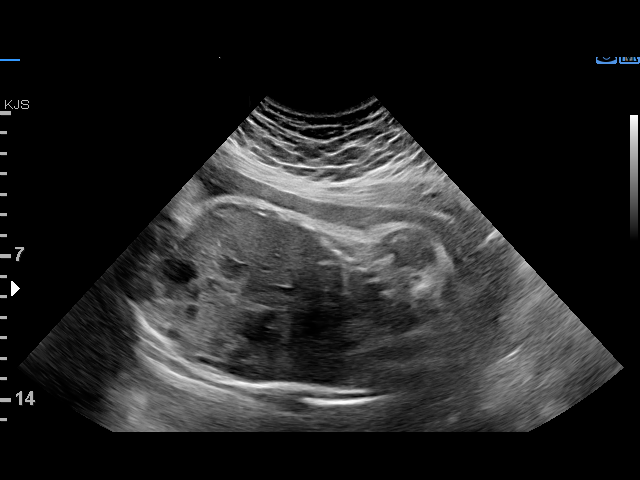
[im 8/26]
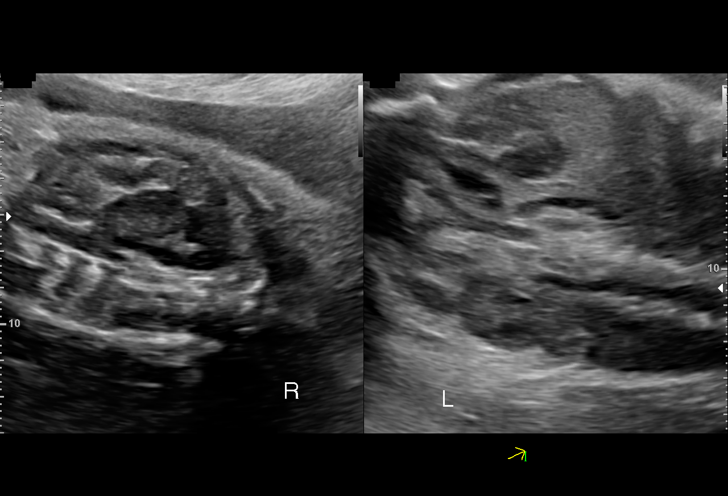
[im 10/26]
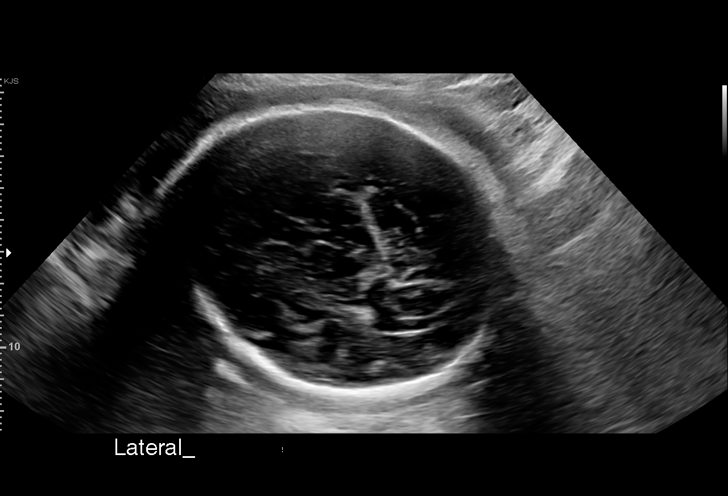
[im 12/26]
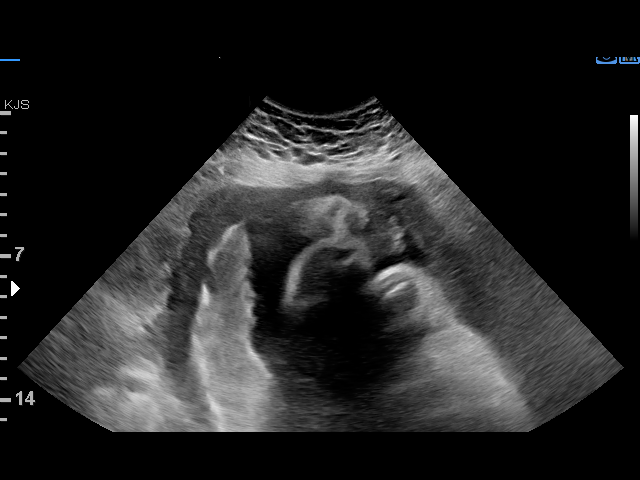
[im 14/26]
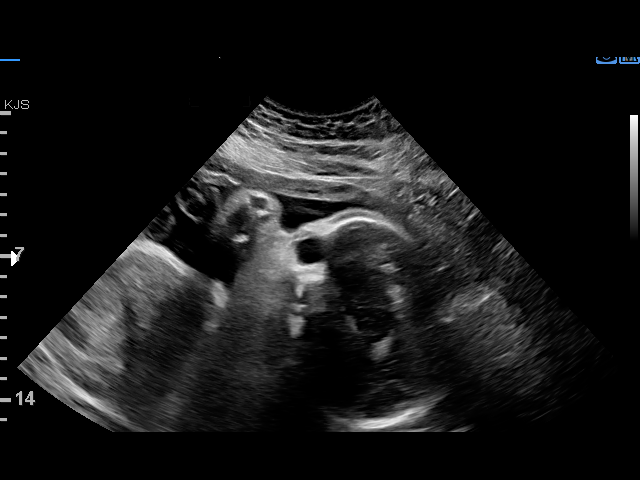
[im 15/26]
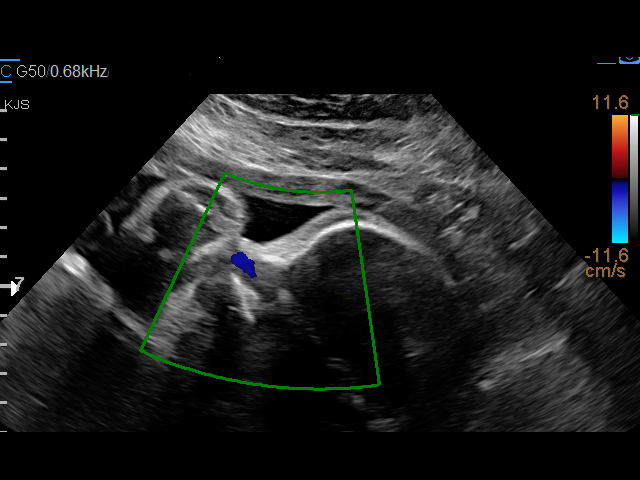
[im 17/26]
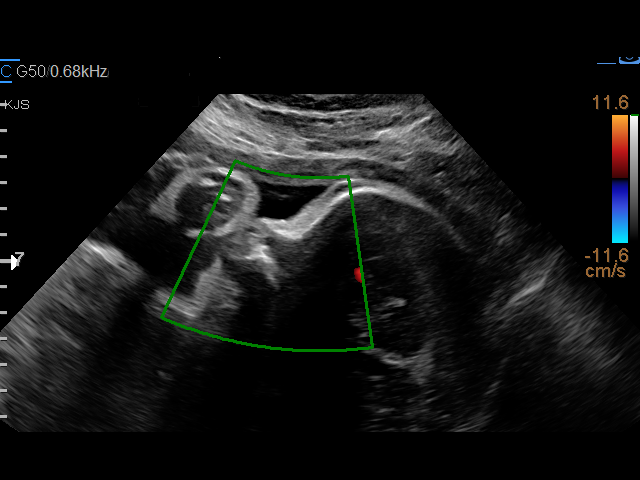
[im 19/26]
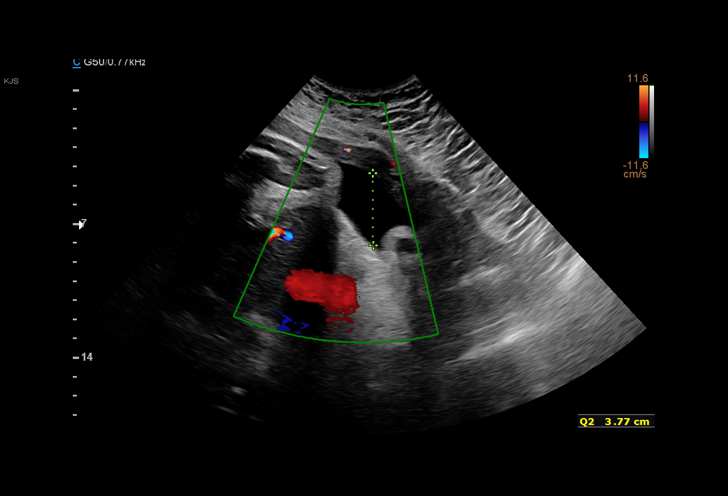
[im 20/26]
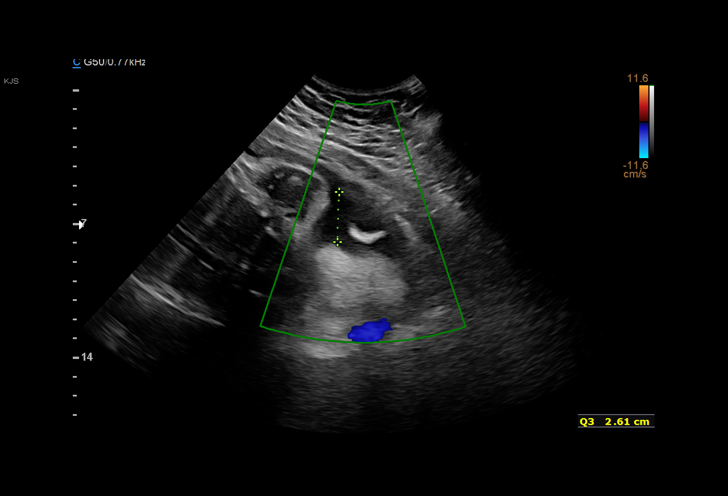
[im 22/26]
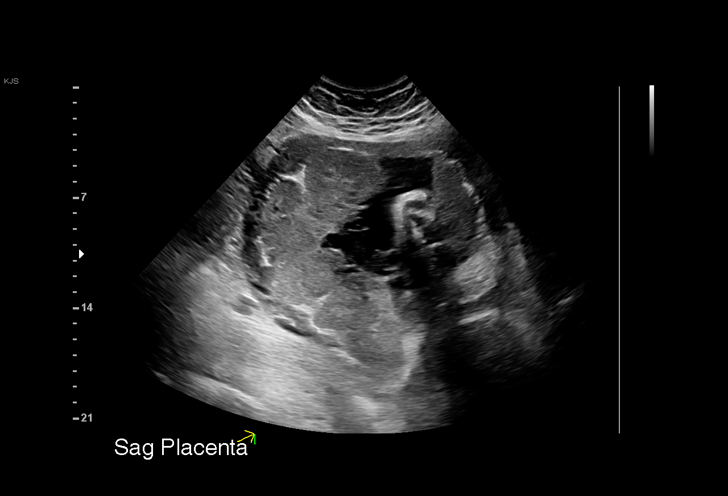
[im 24/26]
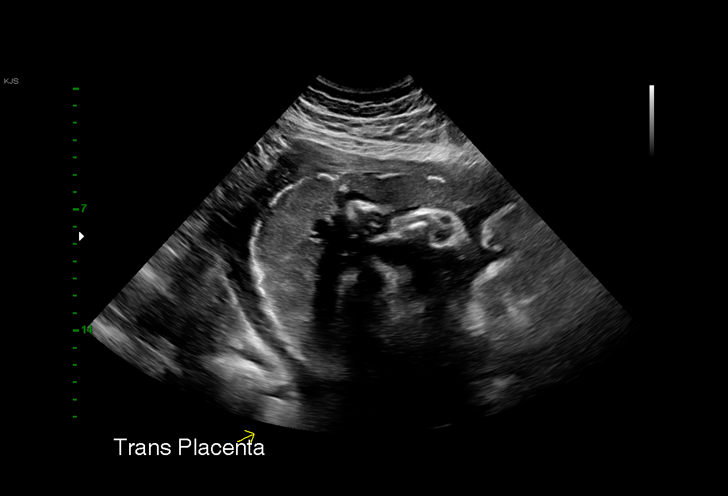
[im 26/26]
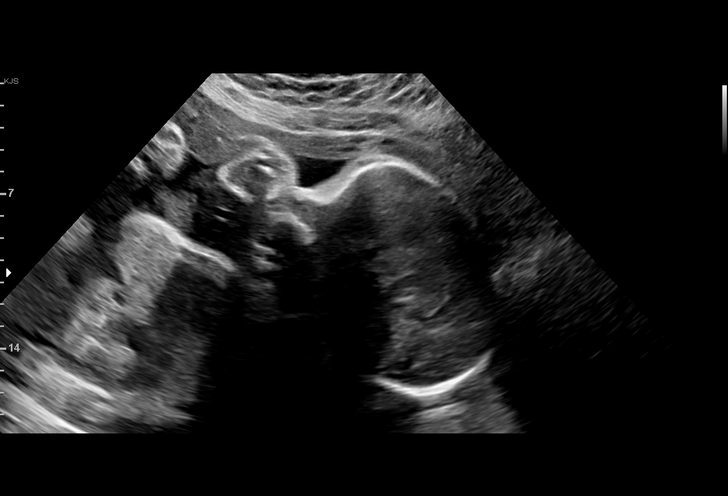

[15 of 26 positions shown; findings below may reference images not displayed]

OB/GYN

Indications

 Obesity complicating pregnancy, third          [PE]
 trimester (BMI 45)
 Hypertension - Chronic/Pre-existing (dx        [PE]
 [DATE])
 Medical complication of pregnancy (Breast      [PE]
 Cancer in patient Dx [DATE])
 Rh negative state in antepartum (Anti-D - too  [PE]
 weak to titer)
 33 weeks gestation of pregnancy
 Neg AFP; NIPS - no result
Fetal Evaluation

 Num Of Fetuses:         1
 Fetal Heart Rate(bpm):  140
 Cardiac Activity:       Observed
 Presentation:           Cephalic
 Placenta:               Posterior
 P. Cord Insertion:      Previously Visualized

 Amniotic Fluid
 AFI FV:      Within normal limits

 AFI Sum(cm)     %Tile       Largest Pocket(cm)
 13.98           48
 RUQ(cm)       RLQ(cm)       LUQ(cm)        LLQ(cm)

Biophysical Evaluation

 Amniotic F.V:   Pocket => 2 cm             F. Tone:        Observed
 F. Movement:    Observed                   Score:          [DATE]
 F. Breathing:   Observed
Biometry

 LV:        5.6  mm
OB History

 Gravidity:    5         Term:   3        Prem:   0        SAB:   0
 TOP:          1       Ectopic:  0        Living: 3
Gestational Age

 LMP:           33w 6d        Date:  [DATE]                 EDD:   [DATE]
 Best:          33w 6d     Det. By:  LMP  ([DATE])          EDD:   [DATE]
Comments

 RORO RORO was seen for a BPP due to maternal obesity with
 a BMI of 45.  Her pregnancy has also been complicated by
 breast cancer (clinical stage IV, estrogen receptor positive).
 She has completed her first 4 cycles of chemotherapy with
 Adriamycin and Cytoxan.  She completed this therapy on
 [DATE].   There is a concern that her cancer is
 growing back now that she is no longer being treated with
 chemotherapy, as the patient reports that she is feeling a new
 mass in her right breast.
 A biophysical profile performed today was [DATE].
 There was normal amniotic fluid noted on today's ultrasound
 exam.
 I contacted the oncology and surgical team that is managing
 the care for her breast cancer.  We are all in agreement that
 the patient should be delivered as soon as possible so that
 she can receive definitive treatment for breast cancer without
 any fetal concerns.
 We will plan on administering a course of antenatal
 corticosteroids (RORO2 doses) starting this
 [REDACTED]. She will be scheduled for an induction next [REDACTED],
 [DATE].
 The patient understands that her baby will require a NICU
 admission following delivery at 34+ weeks.  She was
 reassured that the neonatal outcome for babies delivered at
 34+ weeks is generally good.
 The patient understands and is agreeable to a late preterm
 delivery in order to enable her to get definitive treatment for
 breast cancer.
 The patient stated that all of her questions have been
 answered to her complete satisfaction.
 A total of 30 minutes was spent counseling and coordinating
 the care for this patient.  Greater than 50% of the time was
 spent in direct face-to-face contact.

## 2021-03-10 NOTE — Progress Notes (Signed)
MFM Note  Evelyn Marshall was seen for a BPP due to maternal obesity with a BMI of 45.  Her pregnancy has also been complicated by breast cancer (clinical stage IV, estrogen receptor positive).    She has completed her first 4 cycles of chemotherapy with Adriamycin and Cytoxan.  She completed this therapy on February 23, 2021.   There is a concern that her cancer is growing back now that she is no longer being treated with chemotherapy, as the patient reports that she is feeling a new mass in her right breast.  A biophysical profile performed today was 8 out of 8.    There was normal amniotic fluid noted on today's ultrasound exam.  I contacted the oncology and surgical team that is managing the care for her breast cancer.  We are all in agreement that the patient should be delivered as soon as possible so that she can receive definitive treatment for breast cancer without any fetal concerns.    We will plan on administering a course of antenatal corticosteroids (betamethasone x2 doses) starting this Sunday. She will be scheduled for an induction next Tuesday, March 15, 2021.  The patient understands that her baby will require a NICU admission following delivery at 34+ weeks.  She was reassured that the neonatal outcome for babies delivered at 34+ weeks is generally good.   The patient understands and is agreeable to a late preterm delivery in order to enable her to get definitive treatment for breast cancer.  The patient stated that all of her questions have been answered to her complete satisfaction.  A total of 30 minutes was spent counseling and coordinating the care for this patient.  Greater than 50% of the time was spent in direct face-to-face contact.

## 2021-03-11 ENCOUNTER — Other Ambulatory Visit: Payer: Self-pay | Admitting: Obstetrics and Gynecology

## 2021-03-11 ENCOUNTER — Encounter (HOSPITAL_COMMUNITY): Payer: Self-pay | Admitting: *Deleted

## 2021-03-11 ENCOUNTER — Telehealth (HOSPITAL_COMMUNITY): Payer: Self-pay | Admitting: *Deleted

## 2021-03-11 ENCOUNTER — Telehealth: Payer: Self-pay | Admitting: Hematology and Oncology

## 2021-03-11 ENCOUNTER — Telehealth: Payer: Self-pay | Admitting: Adult Health

## 2021-03-11 ENCOUNTER — Encounter: Payer: Self-pay | Admitting: *Deleted

## 2021-03-11 NOTE — Telephone Encounter (Signed)
Preadmission screen  

## 2021-03-11 NOTE — Telephone Encounter (Signed)
"  I saw Evelyn Marshall for her obstetrical ultrasound today. She is concerned that the tumor in her right breast is growing again now that she has completed chemotherapy with Adriamycin and Cytoxan.   She is now [redacted] weeks pregnant.  The long-term neonatal outcome for babies that are delivered at this gestational age is good.  Her baby is probably close to 5 pounds as a growth ultrasound performed two weeks ago showed an estimated fetal weight of 4 pounds 12 ounces.   If you believe that she may benefit from delivery as soon as possible so that she can receive definitive treatment i.e., mastectomy, additional chemotherapy, and radiation, I will make the recommendation that she be delivered sometime next week once you have made all the necessary arrangements for her to receive definitive treatment immediately after delivery.   "  Received the above message from Dr. Burr Medico in maternal-fetal medicine.  I discussed with Dr. Lindi Adie and Dr. Donne Hazel.  Our recommendation is for the patient to proceed with delivery since her ultrasound report shows slight increase in her breast cancer.  I called [name] and let her know about our recommendation.  She has follow-up with Dr. Lindi Adie and Dr. Donne Hazel on Monday.

## 2021-03-11 NOTE — Telephone Encounter (Signed)
Sch per 2/16 inbasket, pt aware

## 2021-03-12 NOTE — Progress Notes (Signed)
Patient Care Team: [name], MD as PCP - General (Family Medicine) Nicholas Lose, MD as Consulting Physician (Hematology and Oncology) Servando Salina, MD as Consulting Physician (Obstetrics and Gynecology) Irene Limbo, MD as Consulting Physician (Plastic Surgery) Rolm Bookbinder, MD as Consulting Physician (General Surgery)  DIAGNOSIS:    ICD-10-CM   1. Malignant neoplasm of upper-outer quadrant of right breast in female, estrogen receptor positive ([place])  C50.411 Ferritin   Z17.0 Iron and Iron Binding Capacity ([facility] only)    ECHOCARDIOGRAM COMPLETE    CBC with Differential ([place] Only)    CMP ([place] only)      SUMMARY OF ONCOLOGIC HISTORY: Oncology History  Malignant neoplasm of upper-outer quadrant of right breast in female, estrogen receptor positive ([place])  12/08/2020 Initial Diagnosis   21-week pregnancy: MRI at [facility] noncontrast revealed right axillary mass 3.7 cm, subpectoral lymph nodes, left supraclavicular lymph node, multiple level 2 and 3 lymph nodes in the right axilla, 2 breast masses 1.1 cm largest size.  Mammogram in [place] revealed 3 small masses at 12:00 1.2 cm, 0.6 cm, 0.9 cm and 5 abnormal axillary lymph nodes, pleomorphic calcifications measuring 10 cm, biopsy revealed grade 2 IDC with necrosis and calcifications ER 30%, PR 0%, HER2 positive, Ki-67 30 to 40%   12/15/2020 Cancer Staging   Staging form: Breast, AJCC 8th Edition - Clinical: Stage IV (cT3, cN2, pM1, G2, ER+, PR-, HER2+) - Signed by Nicholas Lose, MD on 12/15/2020 Histologic grading system: 3 grade system    12/24/2020 - 02/23/2021 Chemotherapy   Patient is on Treatment Plan : BREAST Adjuvant AC q21d     12/29/2020 Genetic Testing   Negative hereditary cancer genetic testing: no pathogenic variants detected in [facility] BRCAPlus Panel or [facility] CustomNext-Cancer +RNAinsight Panel.  The report dates are 12/23/2020 and 12/29/2020.   The BRCAplus panel offered by [employer] and includes sequencing and deletion/duplication analysis for the following 8 genes: ATM, BRCA1, BRCA2, CDH1, CHEK2, PALB2, PTEN, and TP53.  The CustomNext-Cancer+RNAinsight panel offered by [facility] includes sequencing and rearrangement analysis for the following 47 genes:  APC, ATM, AXIN2, BARD1, BMPR1A, BRCA1, BRCA2, BRIP1, CDH1, CDK4, CDKN2A, CHEK2, DICER1, EPCAM, GREM1, HOXB13, MEN1, MLH1, MSH2, MSH3, MSH6, MUTYH, NBN, NF1, NF2, NTHL1, PALB2, PMS2, POLD1, POLE, PTEN, RAD51C, RAD51D, RECQL, RET, SDHA, SDHAF2, SDHB, SDHC, SDHD, SMAD4, SMARCA4, STK11, TP53, TSC1, TSC2, and VHL.  RNA data is routinely analyzed for use in variant interpretation for all genes.   03/25/2021 -  Chemotherapy   Patient is on Treatment Plan : BREAST AC q21 days / Trastuzumab + Pertuzumab + Weekly Paclitaxel q21d       CHIEF COMPLIANT: Follow-up of breast cancer  INTERVAL HISTORY: Evelyn Marshall is a 36 y.o. with above-mentioned history of breast cancer. She presents to the clinic today for follow-up.  She is going to be induced tomorrow for delivery of her baby.  She is very excited about that.  She met with Dr. Donne Hazel this morning and the plan is for her to undergo the delivery of the baby and in 10 days start her on further neoadjuvant chemotherapy with Taxol Herceptin and Perjeta.  The goal is to further shrink the tumors to see if she would become eligible for lumpectomy at some point in the future.  ALLERGIES:  has No Known Allergies.  MEDICATIONS:  Current Outpatient Medications  Medication Sig Dispense Refill   aspirin EC 81 MG tablet Take 81 mg by mouth daily. Swallow whole.  Doxylamine-Pyridoxine 10-10 MG TBEC Take 1 tablet by mouth 3 (three) times daily as needed (nausea).     Iron-FA-B Cmp-C-Biot-Probiotic (FUSION PLUS) CAPS Take 1 capsule by mouth daily.     labetalol (NORMODYNE) 100 MG tablet Take 1 tablet (100 mg total) by mouth 2 (two) times daily. 60 tablet 4   No current  facility-administered medications for this visit.    PHYSICAL EXAMINATION: ECOG PERFORMANCE STATUS: 1 - Symptomatic but completely ambulatory  Vitals:   03/14/21 1414  BP: (!) 181/84  Pulse: (!) 112  Resp: 18  Temp: 97.7 F (36.5 C)  SpO2: 100%   Filed Weights   03/14/21 1414  Weight: 294 lb 11.2 oz (133.7 kg)      LABORATORY DATA:  I have reviewed the data as listed CMP Latest Ref Rng & Units 03/13/2021 02/23/2021 02/03/2021  Glucose 70 - 99 mg/dL 110(H) 100(H) 118(H)  BUN 6 - 20 mg/dL <5(L) 5(L) <5(L)  Creatinine 0.44 - 1.00 mg/dL 0.57 0.45 0.45  Sodium 135 - 145 mmol/L 133(L) 135 136  Potassium 3.5 - 5.1 mmol/L 3.4(L) 3.5 3.1(L)  Chloride 98 - 111 mmol/L 104 104 106  CO2 22 - 32 mmol/L 18(L) 24 22  Calcium 8.9 - 10.3 mg/dL 9.3 9.0 8.4(L)  Total Protein 6.5 - 8.1 g/dL 6.5 6.5 6.2(L)  Total Bilirubin 0.3 - 1.2 mg/dL 0.2(L) 0.2(L) 0.2(L)  Alkaline Phos 38 - 126 U/L 124 123 115  AST 15 - 41 U/L '28 21 18  ' ALT 0 - 44 U/L '21 16 29    ' Lab Results  Component Value Date   WBC 3.6 (L) 03/13/2021   HGB 10.5 (L) 03/13/2021   HCT 32.2 (L) 03/13/2021   MCV 76.5 (L) 03/13/2021   PLT 406 (H) 03/13/2021   NEUTROABS 2.5 03/13/2021    ASSESSMENT & PLAN:  Malignant neoplasm of upper-outer quadrant of right breast in female, estrogen receptor positive ([place]) 12/08/2020:21-week pregnancy: MRI at [facility] noncontrast revealed right axillary mass 3.7 cm, subpectoral lymph nodes, left supraclavicular lymph node, multiple level 2 and 3 lymph nodes in the right axilla, 2 breast masses 1.1 cm largest size.  Mammogram in [place] revealed 3 small masses at 12:00 1.2 cm, 0.6 cm, 0.9 cm and 5 abnormal axillary lymph nodes, pleomorphic calcifications measuring 10 cm, biopsy revealed grade 2 IDC with necrosis and calcifications ER 30%, PR 0%, HER2 positive, Ki-67 30 to 40%   Treatment plan: 1.  Neoadjuvant chemotherapy with Adriamycin and Cytoxan every 3 weeks x4 started 12/24/2020 completed  02/23/2021 2. delivery of the baby 3.  Continue neoadjuvant therapy with Taxol Herceptin and Perjeta 4.  Mastectomy with ALND versus breast conserving surgery if she has a dramatic response 5.  Adjuvant radiation therapy 6.  Adjuvant antiestrogen therapy with ovarian function suppression Genetic testing ---------------------------------------------------------------------------------------------------------------------------------- Ultrasound breast at [facility]: 1.6 cm mass at 12:00 (previously 1.7, 1 mass disappeared, mass 1:30 position: 1.3 cm (was previously 1 cm), another mass 1 cm (previously 0.6 cm), mass at 11:00: 1.1 cm (previously 1.1 cm) right axillary lymph node stable 1 lymph node 2.9 cm (was 3.1 cm)  Plans to pursue delivery of the baby which is happening with an induction tomorrow. Plan to stop the Taxol Herceptin Perjeta starting 03/25/2021. She will receive Taxol weekly and Herceptin Perjeta every 3 weeks. Patient is agreeable with the plan.    Orders Placed This Encounter  Procedures   Ferritin    Standing Status:   Future    Standing Expiration Date:  03/14/2022   Iron and Iron Binding Capacity ([facility] only)    Standing Status:   Future    Standing Expiration Date:   03/14/2022   CBC with Differential (Cancer Center Only)    Standing Status:   Standing    Number of Occurrences:   20    Standing Expiration Date:   03/14/2022   CMP ([place] only)    Standing Status:   Standing    Number of Occurrences:   20    Standing Expiration Date:   03/14/2022   ECHOCARDIOGRAM COMPLETE    Standing Status:   Future    Standing Expiration Date:   03/14/2022    Order Specific Question:   Where should this test be performed    Answer:   [place]    Order Specific Question:   Perflutren DEFINITY (image enhancing agent) should be administered unless hypersensitivity or allergy exist    Answer:   Administer Perflutren    Order Specific Question:   Reason for exam-Echo    Answer:    Chemo  Z09    Order Specific Question:   Release to patient    Answer:   Immediate   The patient has a good understanding of the overall plan. she agrees with it. she will call with any problems that may develop before the next visit here.  Total time spent: 30 mins including face to face time and time spent for planning, charting and coordination of care  Rulon Eisenmenger, MD, MPH 03/14/2021  I, Thana Ates, am acting as scribe for Dr. Nicholas Lose.  I have reviewed the above documentation for accuracy and completeness, and I agree with the above.

## 2021-03-13 ENCOUNTER — Encounter (HOSPITAL_COMMUNITY): Payer: Self-pay | Admitting: Obstetrics and Gynecology

## 2021-03-13 ENCOUNTER — Other Ambulatory Visit: Payer: Self-pay

## 2021-03-13 ENCOUNTER — Inpatient Hospital Stay (HOSPITAL_COMMUNITY)
Admission: RE | Admit: 2021-03-13 | Discharge: 2021-03-13 | Disposition: A | Discharge status: Home / Self Care | Payer: BC Managed Care – PPO | Source: Home / Self Care | Attending: Obstetrics and Gynecology | Admitting: Obstetrics and Gynecology

## 2021-03-13 ENCOUNTER — Inpatient Hospital Stay (HOSPITAL_COMMUNITY): Payer: BC Managed Care – PPO | Attending: Obstetrics and Gynecology

## 2021-03-13 DIAGNOSIS — C50911 Malignant neoplasm of unspecified site of right female breast: Secondary | ICD-10-CM | POA: Insufficient documentation

## 2021-03-13 DIAGNOSIS — O9A113 Malignant neoplasm complicating pregnancy, third trimester: Secondary | ICD-10-CM | POA: Diagnosis present

## 2021-03-13 DIAGNOSIS — Z79899 Other long term (current) drug therapy: Secondary | ICD-10-CM | POA: Insufficient documentation

## 2021-03-13 DIAGNOSIS — O99213 Obesity complicating pregnancy, third trimester: Secondary | ICD-10-CM | POA: Insufficient documentation

## 2021-03-13 DIAGNOSIS — Z3A34 34 weeks gestation of pregnancy: Secondary | ICD-10-CM | POA: Insufficient documentation

## 2021-03-13 DIAGNOSIS — O9A12 Malignant neoplasm complicating childbirth: Secondary | ICD-10-CM | POA: Diagnosis not present

## 2021-03-13 DIAGNOSIS — O10913 Unspecified pre-existing hypertension complicating pregnancy, third trimester: Secondary | ICD-10-CM | POA: Insufficient documentation

## 2021-03-13 DIAGNOSIS — D509 Iron deficiency anemia, unspecified: Secondary | ICD-10-CM | POA: Insufficient documentation

## 2021-03-13 DIAGNOSIS — O1213 Gestational proteinuria, third trimester: Secondary | ICD-10-CM | POA: Insufficient documentation

## 2021-03-13 DIAGNOSIS — O99013 Anemia complicating pregnancy, third trimester: Secondary | ICD-10-CM | POA: Insufficient documentation

## 2021-03-13 DIAGNOSIS — E669 Obesity, unspecified: Secondary | ICD-10-CM | POA: Insufficient documentation

## 2021-03-13 DIAGNOSIS — Z9221 Personal history of antineoplastic chemotherapy: Secondary | ICD-10-CM | POA: Insufficient documentation

## 2021-03-13 DIAGNOSIS — O10013 Pre-existing essential hypertension complicating pregnancy, third trimester: Secondary | ICD-10-CM

## 2021-03-13 DIAGNOSIS — R12 Heartburn: Secondary | ICD-10-CM | POA: Insufficient documentation

## 2021-03-13 DIAGNOSIS — O09293 Supervision of pregnancy with other poor reproductive or obstetric history, third trimester: Secondary | ICD-10-CM | POA: Insufficient documentation

## 2021-03-13 DIAGNOSIS — Z7982 Long term (current) use of aspirin: Secondary | ICD-10-CM | POA: Insufficient documentation

## 2021-03-13 HISTORY — DX: Malignant neoplasm complicating pregnancy, third trimester: O9A.113

## 2021-03-13 LAB — URINALYSIS, MICROSCOPIC (REFLEX)

## 2021-03-13 LAB — URINALYSIS, ROUTINE W REFLEX MICROSCOPIC
Bilirubin Urine: NEGATIVE
Glucose, UA: NEGATIVE mg/dL
Hgb urine dipstick: NEGATIVE
Ketones, ur: NEGATIVE mg/dL
Leukocytes,Ua: NEGATIVE
Nitrite: NEGATIVE
Protein, ur: 30 mg/dL — AB
Specific Gravity, Urine: 1.025 (ref 1.005–1.030)
pH: 6.5 (ref 5.0–8.0)

## 2021-03-13 LAB — COMPREHENSIVE METABOLIC PANEL
ALT: 21 U/L (ref 0–44)
AST: 28 U/L (ref 15–41)
Albumin: 2.8 g/dL — ABNORMAL LOW (ref 3.5–5.0)
Alkaline Phosphatase: 124 U/L (ref 38–126)
Anion gap: 11 (ref 5–15)
BUN: <5 mg/dL — ABNORMAL LOW (ref 6–20)
CO2: 18 mmol/L — ABNORMAL LOW (ref 22–32)
Calcium: 9.3 mg/dL (ref 8.9–10.3)
Chloride: 104 mmol/L (ref 98–111)
Creatinine, Ser: 0.57 mg/dL (ref 0.44–1.00)
GFR, Estimated: >60 mL/min (ref >60)
Glucose, Bld: 110 mg/dL — ABNORMAL HIGH (ref 70–99)
Potassium: 3.4 mmol/L — ABNORMAL LOW (ref 3.5–5.1)
Sodium: 133 mmol/L — ABNORMAL LOW (ref 135–145)
Total Bilirubin: 0.2 mg/dL — ABNORMAL LOW (ref 0.3–1.2)
Total Protein: 6.5 g/dL (ref 6.5–8.1)

## 2021-03-13 LAB — CBC WITH DIFFERENTIAL/PLATELET
Abs Immature Granulocytes: 0.08 10*3/uL — ABNORMAL HIGH (ref 0.00–0.07)
Basophils Absolute: 0 10*3/uL (ref 0.0–0.1)
Basophils Relative: 1 %
Eosinophils Absolute: 0 10*3/uL (ref 0.0–0.5)
Eosinophils Relative: 1 %
HCT: 32.2 % — ABNORMAL LOW (ref 36.0–46.0)
Hemoglobin: 10.5 g/dL — ABNORMAL LOW (ref 12.0–15.0)
Immature Granulocytes: 2 %
Lymphocytes Relative: 10 %
Lymphs Abs: 0.4 10*3/uL — ABNORMAL LOW (ref 0.7–4.0)
MCH: 24.9 pg — ABNORMAL LOW (ref 26.0–34.0)
MCHC: 32.6 g/dL (ref 30.0–36.0)
MCV: 76.5 fL — ABNORMAL LOW (ref 80.0–100.0)
Monocytes Absolute: 0.7 10*3/uL (ref 0.1–1.0)
Monocytes Relative: 19 %
Neutro Abs: 2.5 10*3/uL (ref 1.7–7.7)
Neutrophils Relative %: 67 %
Platelets: 406 10*3/uL — ABNORMAL HIGH (ref 150–400)
RBC: 4.21 MIL/uL (ref 3.87–5.11)
RDW: 20.9 % — ABNORMAL HIGH (ref 11.5–15.5)
WBC: 3.6 10*3/uL — ABNORMAL LOW (ref 4.0–10.5)
nRBC: 0 % (ref 0.0–0.2)

## 2021-03-13 LAB — PROTEIN / CREATININE RATIO, URINE
Creatinine, Urine: 165.6 mg/dL
Protein Creatinine Ratio: 0.39 mg/mg{Cre} — ABNORMAL HIGH (ref 0.00–0.15)
Total Protein, Urine: 64 mg/dL

## 2021-03-13 MED ORDER — LABETALOL HCL 100 MG PO TABS
100.0000 mg | ORAL_TABLET | Freq: Two times a day (BID) | ORAL | Status: DC
  Administered 2021-03-13: 100 mg via ORAL
  Filled 2021-03-13: qty 1

## 2021-03-13 MED ORDER — LABETALOL HCL 100 MG PO TABS: 100.0000 mg | ORAL_TABLET | Freq: Two times a day (BID) | ORAL | 4 refills | Status: DC

## 2021-03-13 MED ORDER — BETAMETHASONE SOD PHOS & ACET 6 (3-3) MG/ML IJ SUSP
12.0000 mg | Freq: Once | INTRAMUSCULAR | Status: AC
  Administered 2021-03-13: 12 mg via INTRAMUSCULAR
  Filled 2021-03-13: qty 5

## 2021-03-13 NOTE — MAU Note (Signed)
Pt reports to mau for first dose of BMZ.. Denies any complaints today.  FHR 145.

## 2021-03-13 NOTE — MAU Provider Note (Addendum)
History     Chief Complaint  Patient presents with   Injections  36 yo [id number] MBF with Stage IV right breast cancer s/p chemotherapy x 4 course now @ 34 2/[redacted] wk gestation presented for 1st dose of BMZ in anticipation of IOL on Tuesday due to her breast cancer. Pt was found to have severe range BP. Pt has known chronic hTn not on med with hx pre-eclampsia for which she is taking baby ASA. Pt has known proteinuria from onset of pregnancy. PNC complicated by dx of breast cancer made after enrolling in a NIH study for pt with unexplained ( un interpretable NIPS test). Subsequent  scan showed enlarged axillary LN and mammogram revealed right breast cancer proven with biopsy. Pt completed adriamycin and cytoxan 2/1.  She has IDA for which she received IV iron infusion x 3 . Today the patient had to walk from [place] parking( not open) which she attributes to her elevated BP. She denies h/a, visual changes . Her heartburn has not changed.    OB History     Gravida  5   Para  3   Term  3   Preterm  0   AB  1   Living  3      SAB  0   IAB      Ectopic      Multiple  0   Live Births  3           Past Medical History:  Diagnosis Date   Abnormal Pap smear 2012   colpo result benign   Cancer (HCC)    Chlamydia    Family history of breast cancer 12/15/2020   History of pre-eclampsia    HPV (human papilloma virus) infection    Hyperemesis    Hypokalemia    Pregnancy induced hypertension    Ptyalism    Vaginal Pap smear, abnormal     Past Surgical History:  Procedure Laterality Date   COLPOSCOPY     IR IMAGING GUIDED PORT INSERTION  12/24/2020   WISDOM TOOTH EXTRACTION      Family History  Problem Relation Age of Onset   Heart disease Father    Hypertension Father    Stroke Father    Breast cancer Paternal Grandmother        dx 46s   Stroke Paternal Grandmother    Breast cancer Cousin 74       maternal female cousin    Social History   Tobacco Use   Smoking  status: Never   Smokeless tobacco: Never  Vaping Use   Vaping Use: Never used  Substance Use Topics   Alcohol use: No   Drug use: No    Allergies: No Known Allergies  Medications Prior to Admission  Medication Sig Dispense Refill Last Dose   aspirin EC 81 MG tablet Take 81 mg by mouth daily. Swallow whole.   Past Week   dexamethasone (DECADRON) 4 MG tablet Take 1 tablet (4 mg total) by mouth daily. Take 1 tablet day after chemo and 1 tablet 2 days after chemo with food 8 tablet 1 Past Month   Doxylamine-Pyridoxine 10-10 MG TBEC Take 1 tablet by mouth 3 (three) times daily as needed (nausea).   Past Week   Iron-FA-B Cmp-C-Biot-Probiotic (FUSION PLUS) CAPS Take 1 capsule by mouth daily.   Past Week   lidocaine-prilocaine (EMLA) cream Apply to affected area once 30 g 3 Past Month   ondansetron (ZOFRAN) 8 MG tablet Take 1  tablet (8 mg total) by mouth 2 (two) times daily as needed. Start on the third day after chemotherapy. 30 tablet 1 Past Week     Physical Exam   Blood pressure 138/85, pulse (!) 103, last menstrual period 07/16/2020, SpO2 99 %, unknown if currently breastfeeding.  General appearance: alert, cooperative, and no distress Lungs: clear to auscultation bilaterally Heart: regular rate and rhythm, S1, S2 normal, no murmur, click, rub or gallop Abdomen:  gravid soft Pelvic: external genitalia normal and ft/50/-3 applied Extremities: Homans sign is negative, no sign of DVT and tr edema DTR 1+ no clonus  Tracing: baseline  140 (+) accel to 155-160 Occ ctx   Bed side sono ; vtx  IMP:  chronic HTN affecting pregnancy third trimester  ? Worsening HTn vs superimposed preeclampsia.  Malignant neoplasm affecting pregnancy IDA affecting pregnancy Obesity affecting pregnancy IUP@ 34 2/7 wk Stage IV right breast cancer Proteinuria affecting pregnancy P) serial BP. Start labetalol 100mg  po bid. 1st dose of BMZ PIH labs.  The urine PCR may be difficult to interpret as pt as  baseline elevated  MDM   Marvene Staff, MD 11:37 AM 03/13/2021   Addendum:  CBC Latest Ref Rng & Units 03/13/2021 02/23/2021 02/03/2021  WBC 4.0 - 10.5 K/uL 3.6(L) 5.2 5.4  Hemoglobin 12.0 - 15.0 g/dL 10.5(L) 8.7(L) 8.5(L)  Hematocrit 36.0 - 46.0 % 32.2(L) 27.2(L) 26.9(L)  Platelets 150 - 400 K/uL 406(H) 383 396    CMP Latest Ref Rng & Units 03/13/2021 02/23/2021 02/03/2021  Glucose 70 - 99 mg/dL 110(H) 100(H) 118(H)  BUN 6 - 20 mg/dL <5(L) 5(L) <5(L)  Creatinine 0.44 - 1.00 mg/dL 0.57 0.45 0.45  Sodium 135 - 145 mmol/L 133(L) 135 136  Potassium 3.5 - 5.1 mmol/L 3.4(L) 3.5 3.1(L)  Chloride 98 - 111 mmol/L 104 104 106  CO2 22 - 32 mmol/L 18(L) 24 22  Calcium 8.9 - 10.3 mg/dL 9.3 9.0 8.4(L)  Total Protein 6.5 - 8.1 g/dL 6.5 6.5 6.2(L)  Total Bilirubin 0.3 - 1.2 mg/dL 0.2(L) 0.2(L) 0.2(L)  Alkaline Phos 38 - 126 U/L 124 123 115  AST 15 - 41 U/L 28 21 18   ALT 0 - 44 U/L 21 16 29    PCR 0.39( which is much less than in the past  No evidence of superimposed preeclampsia Will continue labetalol  D/c home  2nd BMZ tomorrow  Has heme/onc & mFM appt tomorrow IOL tuesday D/c home   Preeclampsia warning signs

## 2021-03-13 NOTE — Discharge Instructions (Signed)
Take BP at home twice a day. Call if top number > 140, bottom number > 100 Preeclampsia warning signs

## 2021-03-13 NOTE — MAU Note (Signed)
Pt BP 175/109, MD notified, RN to put in verbal [facility] labs and start Fetal monitoring.

## 2021-03-14 ENCOUNTER — Ambulatory Visit (HOSPITAL_BASED_OUTPATIENT_CLINIC_OR_DEPARTMENT_OTHER): Payer: BC Managed Care – PPO

## 2021-03-14 ENCOUNTER — Ambulatory Visit: Payer: BC Managed Care – PPO | Admitting: *Deleted

## 2021-03-14 ENCOUNTER — Ambulatory Visit (HOSPITAL_BASED_OUTPATIENT_CLINIC_OR_DEPARTMENT_OTHER): Payer: BC Managed Care – PPO | Admitting: *Deleted

## 2021-03-14 ENCOUNTER — Inpatient Hospital Stay (HOSPITAL_BASED_OUTPATIENT_CLINIC_OR_DEPARTMENT_OTHER): Payer: BC Managed Care – PPO | Admitting: Hematology and Oncology

## 2021-03-14 VITALS — BP 167/86 | HR 88

## 2021-03-14 DIAGNOSIS — E669 Obesity, unspecified: Secondary | ICD-10-CM | POA: Insufficient documentation

## 2021-03-14 DIAGNOSIS — Z3689 Encounter for other specified antenatal screening: Secondary | ICD-10-CM | POA: Insufficient documentation

## 2021-03-14 DIAGNOSIS — O10913 Unspecified pre-existing hypertension complicating pregnancy, third trimester: Secondary | ICD-10-CM | POA: Insufficient documentation

## 2021-03-14 DIAGNOSIS — C50919 Malignant neoplasm of unspecified site of unspecified female breast: Secondary | ICD-10-CM | POA: Insufficient documentation

## 2021-03-14 DIAGNOSIS — O10013 Pre-existing essential hypertension complicating pregnancy, third trimester: Secondary | ICD-10-CM | POA: Diagnosis not present

## 2021-03-14 DIAGNOSIS — C50411 Malignant neoplasm of upper-outer quadrant of right female breast: Secondary | ICD-10-CM

## 2021-03-14 DIAGNOSIS — Z3A34 34 weeks gestation of pregnancy: Secondary | ICD-10-CM | POA: Insufficient documentation

## 2021-03-14 DIAGNOSIS — O99213 Obesity complicating pregnancy, third trimester: Secondary | ICD-10-CM

## 2021-03-14 DIAGNOSIS — Z170 Estrogen receptor positive status [ER+]: Secondary | ICD-10-CM

## 2021-03-14 DIAGNOSIS — O9A113 Malignant neoplasm complicating pregnancy, third trimester: Secondary | ICD-10-CM | POA: Insufficient documentation

## 2021-03-14 DIAGNOSIS — O99013 Anemia complicating pregnancy, third trimester: Secondary | ICD-10-CM | POA: Diagnosis present

## 2021-03-14 DIAGNOSIS — Z5111 Encounter for antineoplastic chemotherapy: Secondary | ICD-10-CM | POA: Diagnosis not present

## 2021-03-14 DIAGNOSIS — D509 Iron deficiency anemia, unspecified: Secondary | ICD-10-CM | POA: Diagnosis not present

## 2021-03-14 MED ORDER — BETAMETHASONE SOD PHOS & ACET 6 (3-3) MG/ML IJ SUSP
12.0000 mg | Freq: Once | INTRAMUSCULAR | Status: AC
  Administered 2021-03-14: 12 mg via INTRAMUSCULAR

## 2021-03-14 NOTE — Progress Notes (Signed)
DISCONTINUE OFF PATHWAY REGIMEN - Breast   [id number]:AC (Doxorubicin IV + Cyclophosphamide IV) q21 Days:   A cycle is every 21 days:     Doxorubicin      Cyclophosphamide   **Always confirm dose/schedule in your pharmacy ordering system**  REASON: Other Reason PRIOR TREATMENT: Off Pathway: AC (Doxorubicin IV + Cyclophosphamide IV) q21 Days TREATMENT RESPONSE: Unable to Evaluate  START OFF PATHWAY REGIMEN - Breast   Custom Intervention:Medical: [Paclitaxel, Trastuzumab and hyaluronidase-oysk, Pertuzumab]:     Paclitaxel      Trastuzumab and hyaluronidase-oysk      Pertuzumab   **Always confirm dose/schedule in your pharmacy ordering system**  Patient Characteristics: Preoperative or Nonsurgical Candidate (Clinical Staging), Neoadjuvant Therapy followed by Surgery, Invasive Disease, Chemotherapy, HER2 Positive, ER Positive Therapeutic Status: Preoperative or Nonsurgical Candidate (Clinical Staging) AJCC M Category: cM0 AJCC Grade: G2 Breast Surgical Plan: Neoadjuvant Therapy followed by Surgery ER Status: Positive (+) AJCC 8 Stage Grouping: IIIA HER2 Status: Positive (+) AJCC T Category: cT3 AJCC N Category: cN2 PR Status: Negative (-) Intent of Therapy: Curative Intent, Discussed with Patient

## 2021-03-14 NOTE — Assessment & Plan Note (Addendum)
12/08/2020:21-week pregnancy: MRI at [place] noncontrast revealed right axillary mass 3.7 cm, subpectoral lymph nodes, left supraclavicular lymph node, multiple level 2 and 3 lymph nodes in the right axilla, 2 breast masses 1.1 cm largest size. Mammogram in [place] revealed 3 small masses at 12:00 1.2 cm, 0.6 cm, 0.9 cm and 5 abnormal axillary lymph nodes, pleomorphic calcifications measuring 10 cm, biopsy revealed grade 2 IDC with necrosis and calcifications ER 30%, PR 0%, HER2 positive, Ki-67 30 to 40%  Treatment plan: 1. Neoadjuvant chemotherapy with Adriamycin and Cytoxan every 3 weeks x4started 12/24/2020 completed 02/23/2021 2. delivery of the baby 3. Continue neoadjuvant therapy with Taxol Herceptin and Perjeta 4. Mastectomy with ALND versus breast conserving surgery if she has a dramatic response 5. Adjuvant radiation therapy 6. Adjuvant antiestrogen therapy with ovarian function suppression Genetic testing ---------------------------------------------------------------------------------------------------------------------------------- Ultrasound breast at [facility]: 1.6 cm mass at 12:00 (previously 1.7, 1 mass disappeared, mass 1:30 position: 1.3 cm (was previously 1 cm), another mass 1 cm (previously 0.6 cm), mass at 11:00: 1.1 cm (previously 1.1 cm) right axillary lymph node stable 1 lymph node 2.9 cm (was 3.1 cm)  Plans to pursue delivery of the baby which is happening with an induction tomorrow. Plan to stop the Taxol Herceptin Perjeta starting 03/25/2021. She will receive Taxol weekly and Herceptin Perjeta every 3 weeks. Patient is agreeable with the plan.

## 2021-03-14 NOTE — Procedures (Addendum)
Evelyn Marshall Revels July 03, 1985 [redacted]w[redacted]d  Fetus A Non-Stress Test Interpretation for 03/14/21  Indication: Chronic Hypertenstion  Fetal Heart Rate A Mode: External Baseline Rate (A): 135 bpm Variability: Moderate Accelerations: 15 x 15 Decelerations: None Multiple birth?: No  Uterine Activity Mode: Toco Contraction Frequency (min): UI Contraction Quality:  (not felt by pt) Resting Tone Palpated: Relaxed  Interpretation (Fetal Testing) Nonstress Test Interpretation: Reactive Overall Impression: Reassuring for gestational age Comments: tracing reviewed by Dr. Donalee Citrin  NST is reactive. Patient received 2nd dose of betamethasone at our office today.

## 2021-03-15 ENCOUNTER — Inpatient Hospital Stay (HOSPITAL_COMMUNITY): Payer: BC Managed Care – PPO

## 2021-03-15 ENCOUNTER — Inpatient Hospital Stay (HOSPITAL_COMMUNITY)
Admission: AD | Admit: 2021-03-15 | Discharge: 2021-03-18 | DRG: 807 | Disposition: A | Discharge status: Home / Self Care | Payer: BC Managed Care – PPO | Attending: Obstetrics and Gynecology | Admitting: Obstetrics and Gynecology

## 2021-03-15 ENCOUNTER — Other Ambulatory Visit: Payer: Self-pay

## 2021-03-15 ENCOUNTER — Encounter (HOSPITAL_COMMUNITY): Payer: Self-pay | Admitting: Obstetrics and Gynecology

## 2021-03-15 DIAGNOSIS — O9A113 Malignant neoplasm complicating pregnancy, third trimester: Secondary | ICD-10-CM | POA: Diagnosis present

## 2021-03-15 DIAGNOSIS — Z3A34 34 weeks gestation of pregnancy: Secondary | ICD-10-CM | POA: Diagnosis not present

## 2021-03-15 DIAGNOSIS — O99214 Obesity complicating childbirth: Secondary | ICD-10-CM | POA: Diagnosis present

## 2021-03-15 DIAGNOSIS — O9A12 Malignant neoplasm complicating childbirth: Secondary | ICD-10-CM | POA: Diagnosis present

## 2021-03-15 DIAGNOSIS — O1002 Pre-existing essential hypertension complicating childbirth: Secondary | ICD-10-CM | POA: Diagnosis present

## 2021-03-15 DIAGNOSIS — Z20822 Contact with and (suspected) exposure to covid-19: Secondary | ICD-10-CM | POA: Diagnosis present

## 2021-03-15 DIAGNOSIS — C50911 Malignant neoplasm of unspecified site of right female breast: Secondary | ICD-10-CM | POA: Diagnosis present

## 2021-03-15 DIAGNOSIS — D509 Iron deficiency anemia, unspecified: Secondary | ICD-10-CM | POA: Diagnosis present

## 2021-03-15 DIAGNOSIS — O9902 Anemia complicating childbirth: Secondary | ICD-10-CM | POA: Diagnosis present

## 2021-03-15 HISTORY — DX: Malignant neoplasm complicating pregnancy, third trimester: O9A.113

## 2021-03-15 LAB — CBC
HCT: 30.7 % — ABNORMAL LOW (ref 36.0–46.0)
Hemoglobin: 9.7 g/dL — ABNORMAL LOW (ref 12.0–15.0)
MCH: 25.1 pg — ABNORMAL LOW (ref 26.0–34.0)
MCHC: 31.6 g/dL (ref 30.0–36.0)
MCV: 79.5 fL — ABNORMAL LOW (ref 80.0–100.0)
Platelets: 382 10*3/uL (ref 150–400)
RBC: 3.86 MIL/uL — ABNORMAL LOW (ref 3.87–5.11)
RDW: 21.8 % — ABNORMAL HIGH (ref 11.5–15.5)
WBC: 7.6 10*3/uL (ref 4.0–10.5)
nRBC: 0 % (ref 0.0–0.2)

## 2021-03-15 LAB — COMPREHENSIVE METABOLIC PANEL
ALT: 18 U/L (ref 0–44)
AST: 27 U/L (ref 15–41)
Albumin: 3 g/dL — ABNORMAL LOW (ref 3.5–5.0)
Alkaline Phosphatase: 113 U/L (ref 38–126)
Anion gap: 12 (ref 5–15)
BUN: 5 mg/dL — ABNORMAL LOW (ref 6–20)
CO2: 17 mmol/L — ABNORMAL LOW (ref 22–32)
Calcium: 9 mg/dL (ref 8.9–10.3)
Chloride: 107 mmol/L (ref 98–111)
Creatinine, Ser: 0.79 mg/dL (ref 0.44–1.00)
GFR, Estimated: >60 mL/min (ref >60)
Glucose, Bld: 129 mg/dL — ABNORMAL HIGH (ref 70–99)
Potassium: 3.6 mmol/L (ref 3.5–5.1)
Sodium: 136 mmol/L (ref 135–145)
Total Bilirubin: 0.1 mg/dL — ABNORMAL LOW (ref 0.3–1.2)
Total Protein: 6.5 g/dL (ref 6.5–8.1)

## 2021-03-15 LAB — TYPE AND SCREEN
ABO/RH(D): O NEG
Antibody Screen: POSITIVE

## 2021-03-15 LAB — MAGNESIUM: Magnesium: 3.6 mg/dL — ABNORMAL HIGH (ref 1.7–2.4)

## 2021-03-15 LAB — RESP PANEL BY RT-PCR (FLU A&B, COVID) ARPGX2
Influenza A by PCR: NEGATIVE
Influenza B by PCR: NEGATIVE
SARS Coronavirus 2 by RT PCR: NEGATIVE

## 2021-03-15 LAB — PROTEIN / CREATININE RATIO, URINE
Creatinine, Urine: 177.48 mg/dL
Protein Creatinine Ratio: 0.64 mg/mg{Cre} — ABNORMAL HIGH (ref 0.00–0.15)
Total Protein, Urine: 113 mg/dL

## 2021-03-15 LAB — RPR: RPR Ser Ql: NONREACTIVE

## 2021-03-15 LAB — GROUP B STREP BY PCR: Group B strep by PCR: NEGATIVE

## 2021-03-15 MED ORDER — OXYTOCIN 10 UNIT/ML IJ SOLN: 10.0000 [IU] | Freq: Once | INTRAMUSCULAR | Status: DC

## 2021-03-15 MED ORDER — MISOPROSTOL 50MCG HALF TABLET
50.0000 ug | ORAL_TABLET | ORAL | Status: DC
  Administered 2021-03-15 (×2): 50 ug via ORAL
  Filled 2021-03-15 (×2): qty 1

## 2021-03-15 MED ORDER — LABETALOL HCL 5 MG/ML IV SOLN
20.0000 mg | INTRAVENOUS | Status: DC | PRN
  Administered 2021-03-15 – 2021-03-18 (×4): 20 mg via INTRAVENOUS
  Filled 2021-03-15 (×4): qty 4

## 2021-03-15 MED ORDER — LIDOCAINE HCL (PF) 1 % IJ SOLN: 30.0000 mL | INTRAMUSCULAR | Status: DC | PRN

## 2021-03-15 MED ORDER — OXYCODONE-ACETAMINOPHEN 5-325 MG PO TABS: 2.0000 | ORAL_TABLET | ORAL | Status: DC | PRN

## 2021-03-15 MED ORDER — OXYCODONE-ACETAMINOPHEN 5-325 MG PO TABS: 1.0000 | ORAL_TABLET | ORAL | Status: DC | PRN

## 2021-03-15 MED ORDER — LABETALOL HCL 5 MG/ML IV SOLN
80.0000 mg | INTRAVENOUS | Status: DC | PRN
  Administered 2021-03-15: 80 mg via INTRAVENOUS
  Filled 2021-03-15 (×2): qty 16

## 2021-03-15 MED ORDER — MAGNESIUM SULFATE 40 GM/1000ML IV SOLN
2.0000 g/h | INTRAVENOUS | Status: DC
  Administered 2021-03-15: 2 g/h via INTRAVENOUS
  Filled 2021-03-15 (×2): qty 1000

## 2021-03-15 MED ORDER — LACTATED RINGERS IV SOLN: INTRAVENOUS | Status: DC

## 2021-03-15 MED ORDER — SODIUM CHLORIDE 0.9 % IV SOLN
5.0000 10*6.[IU] | Freq: Once | INTRAVENOUS | Status: AC
  Administered 2021-03-15: 5 10*6.[IU] via INTRAVENOUS
  Filled 2021-03-15: qty 5

## 2021-03-15 MED ORDER — TERBUTALINE SULFATE 1 MG/ML IJ SOLN: 0.2500 mg | Freq: Once | INTRAMUSCULAR | Status: DC | PRN

## 2021-03-15 MED ORDER — OXYTOCIN-SODIUM CHLORIDE 30-0.9 UT/500ML-% IV SOLN
2.5000 [IU]/h | INTRAVENOUS | Status: DC
  Filled 2021-03-15: qty 500

## 2021-03-15 MED ORDER — LABETALOL HCL 5 MG/ML IV SOLN
40.0000 mg | INTRAVENOUS | Status: DC | PRN
  Administered 2021-03-15: 40 mg via INTRAVENOUS
  Filled 2021-03-15 (×3): qty 8

## 2021-03-15 MED ORDER — HYDRALAZINE HCL 20 MG/ML IJ SOLN
10.0000 mg | INTRAMUSCULAR | Status: DC | PRN
  Filled 2021-03-15: qty 1

## 2021-03-15 MED ORDER — ACETAMINOPHEN 325 MG PO TABS: 650.0000 mg | ORAL_TABLET | ORAL | Status: DC | PRN

## 2021-03-15 MED ORDER — PENICILLIN G POT IN DEXTROSE 60000 UNIT/ML IV SOLN
3.0000 10*6.[IU] | INTRAVENOUS | Status: DC
  Administered 2021-03-15 (×2): 3 10*6.[IU] via INTRAVENOUS
  Filled 2021-03-15 (×2): qty 50

## 2021-03-15 MED ORDER — SOD CITRATE-CITRIC ACID 500-334 MG/5ML PO SOLN: 30.0000 mL | ORAL | Status: DC | PRN

## 2021-03-15 MED ORDER — LACTATED RINGERS IV SOLN: 500.0000 mL | INTRAVENOUS | Status: DC | PRN

## 2021-03-15 MED ORDER — MISOPROSTOL 25 MCG QUARTER TABLET
25.0000 ug | ORAL_TABLET | ORAL | Status: DC
  Administered 2021-03-15 (×2): 25 ug via VAGINAL
  Filled 2021-03-15 (×2): qty 1

## 2021-03-15 MED ORDER — MAGNESIUM SULFATE BOLUS VIA INFUSION
4.0000 g | Freq: Once | INTRAVENOUS | Status: AC
  Administered 2021-03-15: 4 g via INTRAVENOUS
  Filled 2021-03-15: qty 1000

## 2021-03-15 MED ORDER — FENTANYL CITRATE (PF) 100 MCG/2ML IJ SOLN: 100.0000 ug | INTRAMUSCULAR | Status: DC | PRN

## 2021-03-15 MED ORDER — ONDANSETRON HCL 4 MG/2ML IJ SOLN: 4.0000 mg | Freq: Four times a day (QID) | INTRAMUSCULAR | Status: DC | PRN

## 2021-03-15 MED ORDER — LABETALOL HCL 200 MG PO TABS
200.0000 mg | ORAL_TABLET | Freq: Two times a day (BID) | ORAL | Status: DC
  Administered 2021-03-15 – 2021-03-18 (×7): 200 mg via ORAL
  Filled 2021-03-15 (×7): qty 1

## 2021-03-15 MED ORDER — OXYTOCIN BOLUS FROM INFUSION
333.0000 mL | Freq: Once | INTRAVENOUS | Status: AC
  Administered 2021-03-16: 333 mL via INTRAVENOUS

## 2021-03-15 NOTE — Progress Notes (Signed)
Evelyn Marshall is a 36 y.o. [id number] at [redacted]w[redacted]d by LMP admitted for  IOL 2nd to stage IV right breast cancer  Subjective: No complaint.  Occ cramping  s/p cytotec ( oral ) x 2ca  Objective: BP (!) 155/94    Pulse 88    Temp 98 F (36.7 C) (Oral)    Resp 18    Ht 5\' 4"  (1.626 m)    Wt 133.4 kg    LMP 07/16/2020    BMI 50.50 kg/m  No intake/output data recorded. Total I/O In: 878.6 [P.O.:280; I.V.:285.3; IV Piggyback:313.3] Out: 350 [Urine:350] Patient Vitals for the past 24 hrs:  BP Temp Temp src Pulse Resp Height Weight  03/15/21 1730 (!) 155/94 -- -- 88 18 -- --  03/15/21 1700 (!) 158/79 -- -- 90 -- -- --  03/15/21 1630 (!) 137/57 98 F (36.7 C) Oral 90 18 -- --  03/15/21 1601 (!) 150/87 -- -- 90 18 -- --  03/15/21 1546 (!) 159/95 -- -- 92 18 -- --  03/15/21 1530 (!) 160/95 -- -- 96 -- -- --  03/15/21 1515 (!) 179/81 -- -- 97 -- -- --  03/15/21 1500 (!) 163/89 -- -- 93 -- -- --  03/15/21 1445 (!) 161/91 -- -- 94 -- -- --  03/15/21 1430 (!) 169/93 -- -- 96 -- -- --  03/15/21 1415 (!) 163/100 -- -- 95 -- -- --  03/15/21 1400 (!) 172/90 -- -- 94 -- -- --  03/15/21 1300 (!) 154/90 -- -- (!) 102 -- -- --  03/15/21 1155 (!) 155/99 98.8 F (37.1 C) Oral 91 18 -- --  03/15/21 0959 138/86 -- -- (!) 108 18 -- --  03/15/21 0832 (!) 144/95 -- -- 97 -- -- --  03/15/21 0810 (!) 160/99 -- -- 93 -- -- --  03/15/21 0653 (!) 152/88 98.9 F (37.2 C) Oral (!) 101 18 -- --  03/15/21 7680 -- -- -- -- -- 5\' 4"  (1.626 m) 133.4 kg    FHT:  FHR: 140 bpm, variability: moderate,  accelerations:  Present,  decelerations:  Absent UC:   irreg SVE:   3 cm dilated, thick effaced, -2 station Tracing: cat 1  Labs: Lab Results  Component Value Date   WBC 7.6 03/15/2021   HGB 9.7 (L) 03/15/2021   HCT 30.7 (L) 03/15/2021   MCV 79.5 (L) 03/15/2021   PLT 382 03/15/2021    Assessment / Plan: Chronic HTn with severe features  on  IV magnesium sulfate On labetalol Stage IV right breast cancer GBS cx  negative IUP @ 34 4/7 week P) vaginal cytotec ( 25 mcg) placed. Cont IV magnesium sulfate, labetalol. Stop IV PCN. Check magnesium level  Anticipated MOD:  NSVD  [place] A Kerly Rigsbee 03/15/2021, 5:58 PM

## 2021-03-15 NOTE — H&P (Signed)
Evelyn Marshall is a 36 y.o. female presenting for IOL 2nd to stage IV breast cancer. S/p chemotherapy x 4 doses. BMZ complete  OB History     Gravida  5   Para  3   Term  3   Preterm  0   AB  1   Living  3      SAB  0   IAB      Ectopic      Multiple  0   Live Births  3          Past Medical History:  Diagnosis Date   Abnormal Pap smear 2012   colpo result benign   Cancer (HCC)    Chlamydia    Family history of breast cancer 12/15/2020   History of pre-eclampsia    HPV (human papilloma virus) infection    Hyperemesis    Hypokalemia    Pregnancy induced hypertension    Ptyalism    Vaginal Pap smear, abnormal    Past Surgical History:  Procedure Laterality Date   COLPOSCOPY     IR IMAGING GUIDED PORT INSERTION  12/24/2020   WISDOM TOOTH EXTRACTION     Family History: family history includes Breast cancer in her paternal grandmother; Breast cancer (age of onset: 77) in her cousin; Heart disease in her father; Hypertension in her father; Stroke in her father and paternal grandmother. Social History:  reports that she has never smoked. She has never used smokeless tobacco. She reports that she does not drink alcohol and does not use drugs.     Maternal Diabetes: No Genetic Screening: Abnormal:  Results: Other: Maternal Ultrasounds/Referrals: Normal Fetal Ultrasounds or other Referrals:  Referred to Materal Fetal Medicine , Other:  Maternal Substance Abuse:  No Significant Maternal Medications:  Meds include: Other: baby asa, labetalol Significant Maternal Lab Results:  Rh negative and Other:   Other Comments:   stage IV breast cancer s/p 4 rounds of cytoxan, adriamycin. BMZ complete 2/19, 2/20. Proteinuria in pregnancy , IDA with IV iron infusion  Review of Systems  All other systems reviewed and are negative. History Dilation: 1 Effacement (%): Thick Station: -3 Exam by:: Khadijatou Borak Blood pressure (!) 144/95, pulse 97, temperature 98.9 F (37.2 C),  temperature source Oral, resp. rate 18, height 5\' 4"  (1.626 m), weight 133.4 kg, last menstrual period 07/16/2020, unknown if currently breastfeeding. Maternal Exam:  Introitus: Normal vulva.  Physical Exam Constitutional:      Appearance: Normal appearance. She is obese.  HENT:     Head: Atraumatic.  Eyes:     Extraocular Movements: Extraocular movements intact.  Cardiovascular:     Rate and Rhythm: Regular rhythm.     Heart sounds: Normal heart sounds.  Pulmonary:     Breath sounds: Normal breath sounds.  Abdominal:     Palpations: Abdomen is soft.  Genitourinary:    General: Normal vulva.  Musculoskeletal:        General: Swelling present.     Cervical back: Neck supple.  Skin:    General: Skin is warm and dry.  Psychiatric:        Mood and Affect: Mood normal.        Behavior: Behavior normal.    Prenatal labs: ABO, Rh: --/--/PENDING (02/21 1610) Antibody: PENDING (02/21 0755) Rubella: Immune (09/06 0000) RPR: Nonreactive (09/01 0000)  HBsAg: Negative (09/06 0000)  HIV: Non-reactive (09/06 0000)  GBS:   unknown. Pending PCR  Assessment/Plan: Stage IV right breast cancer IUP @  34 4/[redacted] wk gestation.  Chronic HTN  affecting pregnancy Proteinuria affecting pregnancy P) admit. [place] labs.oral cytotec  for cervical ripening.  Increase labetalol to 200 mg  po bid. IV PCN pending GBS PCR. Analgesic prn   Evelyn Marshall 03/15/2021, 8:35 AM

## 2021-03-16 ENCOUNTER — Encounter (HOSPITAL_COMMUNITY): Payer: Self-pay | Admitting: Obstetrics and Gynecology

## 2021-03-16 ENCOUNTER — Inpatient Hospital Stay (HOSPITAL_COMMUNITY): Payer: BC Managed Care – PPO | Admitting: Anesthesiology

## 2021-03-16 ENCOUNTER — Encounter: Payer: Self-pay | Admitting: Hematology and Oncology

## 2021-03-16 ENCOUNTER — Other Ambulatory Visit: Payer: Self-pay

## 2021-03-16 DIAGNOSIS — Z170 Estrogen receptor positive status [ER+]: Secondary | ICD-10-CM

## 2021-03-16 LAB — CBC
HCT: 31.6 % — ABNORMAL LOW (ref 36.0–46.0)
Hemoglobin: 10.2 g/dL — ABNORMAL LOW (ref 12.0–15.0)
MCH: 25.1 pg — ABNORMAL LOW (ref 26.0–34.0)
MCHC: 32.3 g/dL (ref 30.0–36.0)
MCV: 77.8 fL — ABNORMAL LOW (ref 80.0–100.0)
Platelets: 390 10*3/uL (ref 150–400)
RBC: 4.06 MIL/uL (ref 3.87–5.11)
RDW: 21.9 % — ABNORMAL HIGH (ref 11.5–15.5)
WBC: 7.8 10*3/uL (ref 4.0–10.5)
nRBC: 1.4 % — ABNORMAL HIGH (ref 0.0–0.2)

## 2021-03-16 MED ORDER — ONDANSETRON HCL 4 MG/2ML IJ SOLN: 4.0000 mg | INTRAMUSCULAR | Status: DC | PRN

## 2021-03-16 MED ORDER — PHENYLEPHRINE 40 MCG/ML (10ML) SYRINGE FOR IV PUSH (FOR BLOOD PRESSURE SUPPORT): 80.0000 ug | PREFILLED_SYRINGE | INTRAVENOUS | Status: DC | PRN

## 2021-03-16 MED ORDER — LACTATED RINGERS IV SOLN: 500.0000 mL | Freq: Once | INTRAVENOUS | Status: DC

## 2021-03-16 MED ORDER — IBUPROFEN 600 MG PO TABS
600.0000 mg | ORAL_TABLET | Freq: Four times a day (QID) | ORAL | Status: DC
  Administered 2021-03-16 – 2021-03-18 (×9): 600 mg via ORAL
  Filled 2021-03-16 (×9): qty 1

## 2021-03-16 MED ORDER — MISOPROSTOL 200 MCG PO TABS
ORAL_TABLET | ORAL | Status: AC
  Filled 2021-03-16: qty 4

## 2021-03-16 MED ORDER — EPHEDRINE 5 MG/ML INJ: 10.0000 mg | INTRAVENOUS | Status: DC | PRN

## 2021-03-16 MED ORDER — ACETAMINOPHEN 325 MG PO TABS
650.0000 mg | ORAL_TABLET | ORAL | Status: DC | PRN
Start: 2021-03-16 — End: 2021-03-19
  Administered 2021-03-16 – 2021-03-17 (×3): 650 mg via ORAL
  Filled 2021-03-16 (×3): qty 2

## 2021-03-16 MED ORDER — SIMETHICONE 80 MG PO CHEW: 80.0000 mg | CHEWABLE_TABLET | ORAL | Status: DC | PRN

## 2021-03-16 MED ORDER — DIBUCAINE (PERIANAL) 1 % EX OINT: 1.0000 "application " | TOPICAL_OINTMENT | CUTANEOUS | Status: DC | PRN

## 2021-03-16 MED ORDER — LACTATED RINGERS IV SOLN: INTRAVENOUS | Status: DC

## 2021-03-16 MED ORDER — DIPHENHYDRAMINE HCL 50 MG/ML IJ SOLN: 12.5000 mg | INTRAMUSCULAR | Status: DC | PRN

## 2021-03-16 MED ORDER — FENTANYL-BUPIVACAINE-NACL 0.5-0.125-0.9 MG/250ML-% EP SOLN
12.0000 mL/h | EPIDURAL | Status: DC | PRN
  Administered 2021-03-16: 12 mL/h via EPIDURAL
  Filled 2021-03-16: qty 250

## 2021-03-16 MED ORDER — ONDANSETRON HCL 4 MG PO TABS: 4.0000 mg | ORAL_TABLET | ORAL | Status: DC | PRN

## 2021-03-16 MED ORDER — SODIUM CHLORIDE 0.9 % IV SOLN
500.0000 mg | Freq: Once | INTRAVENOUS | Status: AC
  Administered 2021-03-17: 500 mg via INTRAVENOUS
  Filled 2021-03-16: qty 25

## 2021-03-16 MED ORDER — SENNOSIDES-DOCUSATE SODIUM 8.6-50 MG PO TABS
2.0000 | ORAL_TABLET | Freq: Every day | ORAL | Status: DC
  Administered 2021-03-17 – 2021-03-18 (×2): 2 via ORAL
  Filled 2021-03-16 (×2): qty 2

## 2021-03-16 MED ORDER — COCONUT OIL OIL
1.0000 "application " | TOPICAL_OIL | Status: DC | PRN
  Administered 2021-03-16: 1 via TOPICAL

## 2021-03-16 MED ORDER — FENTANYL-BUPIVACAINE-NACL 0.5-0.125-0.9 MG/250ML-% EP SOLN: 12.0000 mL/h | EPIDURAL | Status: DC | PRN

## 2021-03-16 MED ORDER — WITCH HAZEL-GLYCERIN EX PADS: 1.0000 "application " | MEDICATED_PAD | CUTANEOUS | Status: DC | PRN

## 2021-03-16 MED ORDER — OXYCODONE HCL 5 MG PO TABS: 5.0000 mg | ORAL_TABLET | ORAL | Status: DC | PRN

## 2021-03-16 MED ORDER — MISOPROSTOL 200 MCG PO TABS
800.0000 ug | ORAL_TABLET | Freq: Once | ORAL | Status: AC
  Administered 2021-03-16: 800 ug via RECTAL

## 2021-03-16 MED ORDER — OXYTOCIN-SODIUM CHLORIDE 30-0.9 UT/500ML-% IV SOLN
1.0000 m[IU]/min | INTRAVENOUS | Status: DC
  Administered 2021-03-16: 2 m[IU]/min via INTRAVENOUS
  Filled 2021-03-16: qty 500

## 2021-03-16 MED ORDER — OXYCODONE HCL 5 MG PO TABS: 10.0000 mg | ORAL_TABLET | ORAL | Status: DC | PRN

## 2021-03-16 MED ORDER — MAGNESIUM SULFATE BOLUS VIA INFUSION
2.0000 g | Freq: Once | INTRAVENOUS | Status: AC
Start: 2021-03-16 — End: 2021-03-16
  Administered 2021-03-16: 2 g via INTRAVENOUS
  Filled 2021-03-16: qty 1000

## 2021-03-16 MED ORDER — PRENATAL MULTIVITAMIN CH
1.0000 | ORAL_TABLET | Freq: Every day | ORAL | Status: DC
  Administered 2021-03-17 – 2021-03-18 (×2): 1 via ORAL
  Filled 2021-03-16 (×2): qty 1

## 2021-03-16 MED ORDER — DIPHENHYDRAMINE HCL 25 MG PO CAPS: 25.0000 mg | ORAL_CAPSULE | Freq: Four times a day (QID) | ORAL | Status: DC | PRN

## 2021-03-16 MED ORDER — FERROUS SULFATE 325 (65 FE) MG PO TABS
325.0000 mg | ORAL_TABLET | Freq: Two times a day (BID) | ORAL | Status: DC
  Administered 2021-03-16 – 2021-03-18 (×5): 325 mg via ORAL
  Filled 2021-03-16 (×5): qty 1

## 2021-03-16 MED ORDER — TERBUTALINE SULFATE 1 MG/ML IJ SOLN: 0.2500 mg | Freq: Once | INTRAMUSCULAR | Status: DC | PRN

## 2021-03-16 MED ORDER — MAGNESIUM SULFATE 40 GM/1000ML IV SOLN
2.0000 g/h | INTRAVENOUS | Status: AC
  Administered 2021-03-16 – 2021-03-17 (×2): 2 g/h via INTRAVENOUS
  Filled 2021-03-16: qty 1000

## 2021-03-16 MED ORDER — LIDOCAINE HCL (PF) 1 % IJ SOLN
INTRAMUSCULAR | Status: DC | PRN
  Administered 2021-03-16 (×2): 4 mL via EPIDURAL

## 2021-03-16 MED ORDER — BENZOCAINE-MENTHOL 20-0.5 % EX AERO: 1.0000 "application " | INHALATION_SPRAY | CUTANEOUS | Status: DC | PRN

## 2021-03-16 MED ORDER — ZOLPIDEM TARTRATE 5 MG PO TABS: 5.0000 mg | ORAL_TABLET | Freq: Every evening | ORAL | Status: DC | PRN

## 2021-03-16 MED ORDER — MAGNESIUM SULFATE 2 GM/50ML IV SOLN: 2.0000 g | Freq: Once | INTRAVENOUS | Status: DC

## 2021-03-16 NOTE — Progress Notes (Signed)
S: notes some cramping  Denies h/a, visual changes   O: Patient Vitals for the past 24 hrs:  BP Temp Temp src Pulse Resp  03/16/21 0730 (!) 154/99 -- -- 85 18  03/16/21 0700 (!) 148/87 -- -- 80 16  03/16/21 0655 (!) 159/92 -- -- 76 16  03/16/21 0641 (!) 182/88 -- -- 84 16  03/16/21 0600 (!) 159/93 -- -- 91 16  03/16/21 0532 (!) 153/94 -- -- 82 16  03/16/21 0502 (!) 145/85 -- -- 81 16  03/16/21 0432 (!) 155/97 -- -- 83 16  03/16/21 0402 (!) 148/74 -- -- 79 16  03/16/21 0332 (!) 154/81 -- -- 83 16  03/16/21 0322 -- 97.9 F (36.6 C) Oral -- --  03/16/21 0302 (!) 159/92 -- -- 84 16  03/16/21 0232 (!) 155/92 -- -- 86 18  03/16/21 0225 (!) 149/77 -- -- 84 16  03/16/21 0105 (!) 145/77 -- -- 78 16  03/16/21 0005 (!) 154/103 -- -- 83 16  03/15/21 2300 113/85 -- -- 90 16  03/15/21 2257 -- 98 F (36.7 C) Oral -- --  03/15/21 2202 (!) 142/81 -- -- 91 18  03/15/21 2102 (!) 154/87 -- -- 86 18  03/15/21 2002 (!) 149/88 -- -- 86 18  03/15/21 1938 (!) 147/60 98 F (36.7 C) Oral 89 --  03/15/21 1909 (!) 148/78 -- -- 91 18  03/15/21 1845 (!) 164/96 -- -- 88 --  03/15/21 1840 (!) 156/111 -- -- 90 --  03/15/21 1800 (!) 156/90 -- -- 86 18  03/15/21 1730 (!) 155/94 -- -- 88 18  03/15/21 1700 (!) 158/79 -- -- 90 --  03/15/21 1630 (!) 137/57 98 F (36.7 C) Oral 90 18  03/15/21 1601 (!) 150/87 -- -- 90 18  03/15/21 1546 (!) 159/95 -- -- 92 18  03/15/21 1530 (!) 160/95 -- -- 96 --  03/15/21 1515 (!) 179/81 -- -- 97 --  03/15/21 1500 (!) 163/89 -- -- 93 --  03/15/21 1445 (!) 161/91 -- -- 94 --  03/15/21 1430 (!) 169/93 -- -- 96 --  03/15/21 1415 (!) 163/100 -- -- 95 --  03/15/21 1400 (!) 172/90 -- -- 94 --  03/15/21 1300 (!) 154/90 -- -- (!) 102 --  03/15/21 1155 (!) 155/99 98.8 F (37.1 C) Oral 91 18  03/15/21 0959 138/86 -- -- (!) 108 18  03/15/21 0832 (!) 144/95 -- -- 97 --  03/15/21 0810 (!) 160/99 -- -- 93 --   Pitocin 14 miu VE 4/70/-2 soft deviated to left  Tracing baseline 167m(+)  accel Irreg ctx  Mag 3.6  IMP: chronic HTN with severe features on magnesium sulfate, labetalol Stage IV right breast cancer IUP @ 34 5/7 wk P) cont with pitocin. Magnesium bolus( 2g). Repeat CBC this am in prep for epidural. Cont exaggerated sims positions. Cont labetalol

## 2021-03-16 NOTE — Anesthesia Procedure Notes (Addendum)
Epidural Patient location during procedure: OB Start time: 03/16/2021 10:29 AM End time: 03/16/2021 10:39 AM  Staffing Anesthesiologist: Janeece Riggers, MD Performed: resident/CRNA and other anesthesia staff   Preanesthetic Checklist Completed: patient identified, IV checked, site marked, risks and benefits discussed, surgical consent, monitors and equipment checked, pre-op evaluation and timeout performed  Epidural Patient position: sitting Prep: DuraPrep and site prepped and draped Patient monitoring: continuous pulse ox and blood pressure Approach: midline Location: L3-L4 Injection technique: LOR air  Needle:  Needle type: Tuohy  Needle gauge: 17 G Needle length: 9 cm and 9 Needle insertion depth: 7 cm Catheter type: closed end flexible Catheter size: 19 Gauge Catheter at skin depth: 13 cm Test dose: negative  Assessment Events: blood not aspirated, injection not painful, no injection resistance, no paresthesia and negative IV test  Additional Notes Second pass by SRNA +CSF.  Needle pulled back immediately.  I passed with LOR at 7cm  negative CSF

## 2021-03-16 NOTE — Progress Notes (Signed)
S: notes some cramping Denies h/a, visual changes Due for next check in 1/2 hr  O:   WDWN BF in NAD VS BP 154/87 P86 RR 18  VE deferred  Tracing: baseline 140 (+) accel to 150 Irreg ctx  IMP chronic HTN with severe features on IV magnesium sulfate On labetalol Stage IV right breast cancer GBS cx neg IUP @ 34 4/7 week P) repeat vaginal cytotec if unchanged exam. IV magnesium sulfate

## 2021-03-16 NOTE — Lactation Note (Addendum)
This note was copied from a baby's chart. Lactation Consultation Note  Patient Name: Boy Kenora Spayd BHALP'F Date: 03/16/2021 Reason for consult: Initial assessment;NICU baby;Late-preterm 34-36.6wks Age:33 hours  Spoke to NICU RN Shanon Brow; he was baby's room with FOB. FOB reported mom won't be BF due to breast cancer and chemotherapy treatments. [place] services are completed at this time.  Feeding Mother's Current Feeding Choice:  (donor milk and formula)  Consult Status Consult Status: Complete Date: 03/16/21 Follow-up type: Other (comment) (Mom can't BF due to breast cancer)   Lassie Demorest S Skyylar Kopf 03/16/2021, 1:22 PM

## 2021-03-16 NOTE — Lactation Note (Addendum)
This note was copied from a baby's chart. Lactation Consultation Note  Patient Name: Boy Dominick Zertuche AJGOT'L Date: 03/16/2021 Reason for consult: Initial assessment;NICU baby;Late-preterm 34-36.6wks;Mother's request;RN request;Infant < 6lbs;Other (Comment) (breast cancer) Age:34 hours  OB Specialty Care RN Estill Bamberg called this [facility] because mom requested Lactation, she wanted to know if she could provide colostrum to her baby before starting her chemotherapy again.  Visited with mom of 3 hours old LPI NICU female, she's a P4 and reported changes during the beginning of the pregnancy (at 10 weeks) but it was related to her breast cancer diagnosis. She noted some masses on the right breast, this LC assisted with hand expression and breast massage on the unaffected side L; mom able to get a small droplet of colostrum, praised her for her efforts.   Mom has been on adriamycin (L5) and cytoxan (L5) during the pregnancy, but her last treatment was on 02/23/2021. Reviewed average lifetime of each drug and according to"Medications and Mother's milk" by Walker Kehr, mom should be OK to provide her breastmilk given the time she has stopped taking these medications. Mom is always aware that due to her treatments, they might have affected/shrink the glandular tissue in her breast and that her milk production  might be impaired.   Mom's plan is to provide colostrum for the next two days until Friday, where she'll re-start her chemotherapy with different types of drugs; she voiced she was encouraged by her oncologist to provide colostrum until starting her next round of chemotherapy.     Maternal Data Has patient been taught Hand Expression?: Yes Does the patient have breastfeeding experience prior to this delivery?: Yes How long did the patient breastfeed?: Baby # 1 for 1 month, baby # 2 4 years and baby # 3 20 months  Feeding Mother's Current Feeding Choice: Breast Milk and Formula  Lactation Tools  Discussed/Used Tools: Pump;Flanges;Coconut oil Flange Size: 24 Breast pump type: Double-Electric Breast Pump Pump Education: Setup, frequency, and cleaning;Milk Storage Reason for Pumping: LPI in NICU, mother's request Pumping frequency: q 3 hours (recommended)  Interventions Interventions: Breast feeding basics reviewed;Hand express;Breast massage;DEBP;Education;"The NICU and Your Baby" book;LC Services brochure;Coconut oil  Plan of care Encouraged mom to work on hand expression, especially when she's on baby's room and use those drops for oral care Mom chooses to initiate pumping and will do so every 3 hours, but she's aware that after her treatments have re-started on Friday she won't be able to provide any more breastmilk to her baby  FOB present. All questions and concerns answered, family to contact NICU LC PRN.  Discharge Pump: DEBP;Personal (Motif Wynonia Musty)  Consult Status Consult Status: Follow-up Date: 03/16/21 Follow-up type: In-patient   Darlyn Repsher Francene Boyers 03/16/2021, 3:30 PM

## 2021-03-16 NOTE — Anesthesia Postprocedure Evaluation (Signed)
Anesthesia Post Note  Patient: Evelyn Marshall  Procedure(s) Performed: AN AD HOC LABOR EPIDURAL     Patient location during evaluation: Mother Baby Anesthesia Type: Epidural Level of consciousness: awake and alert Pain management: pain level controlled Vital Signs Assessment: post-procedure vital signs reviewed and stable Respiratory status: spontaneous breathing, nonlabored ventilation and respiratory function stable Cardiovascular status: stable Postop Assessment: no headache, no backache and epidural receding Anesthetic complications: no   No notable events documented.  Last Vitals:  Vitals:   03/16/21 1317 03/16/21 1326  BP: (!) 178/91 (!) 166/81  Pulse: 81 81  Resp:    Temp:    SpO2:      Last Pain:  Vitals:   03/16/21 1102  TempSrc:   PainSc: 0-No pain                 Malasia Torain

## 2021-03-16 NOTE — Anesthesia Preprocedure Evaluation (Signed)
Anesthesia Evaluation  Patient identified by MRN, date of birth, ID band Patient awake    Reviewed: Allergy & Precautions, H&P , NPO status , Patient's Chart, lab work & pertinent test results, reviewed documented beta blocker date and time   Airway Mallampati: II  TM Distance: >3 FB Neck ROM: full    Dental no notable dental hx. (+) Teeth Intact, Dental Advisory Given   Pulmonary neg pulmonary ROS,    Pulmonary exam normal breath sounds clear to auscultation       Cardiovascular hypertension, Pt. on medications and Pt. on home beta blockers negative cardio ROS Normal cardiovascular exam Rhythm:regular Rate:Normal     Neuro/Psych negative neurological ROS  negative psych ROS   GI/Hepatic negative GI ROS, Neg liver ROS,   Endo/Other  negative endocrine ROS  Renal/GU negative Renal ROS  negative genitourinary   Musculoskeletal   Abdominal   Peds  Hematology negative hematology ROS (+)   Anesthesia Other Findings   Reproductive/Obstetrics (+) Pregnancy                             Anesthesia Physical Anesthesia Plan  ASA: 3  Anesthesia Plan: Epidural   Post-op Pain Management: Minimal or no pain anticipated   Induction:   PONV Risk Score and Plan: 2 and Treatment may vary due to age or medical condition  Airway Management Planned: Natural Airway  Additional Equipment: None  Intra-op Plan:   Post-operative Plan:   Informed Consent: I have reviewed the patients History and Physical, chart, labs and discussed the procedure including the risks, benefits and alternatives for the proposed anesthesia with the patient or authorized representative who has indicated his/her understanding and acceptance.     Dental Advisory Given  Plan Discussed with: Anesthesiologist and CRNA  Anesthesia Plan Comments: (Labs checked- platelets confirmed with RN in room. Fetal heart tracing, per RN,  reported to be stable enough for sitting procedure. Discussed epidural, and patient consents to the procedure:  included risk of possible headache,backache, failed block, allergic reaction, and nerve injury. This patient was asked if she had any questions or concerns before the procedure started.)        Anesthesia Quick Evaluation

## 2021-03-16 NOTE — Lactation Note (Signed)
This note was copied from a baby's chart. Lactation Consultation Note  Patient Name: Evelyn Marshall Today's Date: 03/16/2021   Age:34 hours  Spoke to L&D RN [place] Mam and she reported to [facility] that mom would rather be seen once she goes to her room in [facility] Special[facility] care. NICU LC to F/U with mom later this afternoon.   [place] 03/16/2021, 1:12 PM

## 2021-03-17 ENCOUNTER — Encounter: Payer: Self-pay | Admitting: *Deleted

## 2021-03-17 LAB — COMPREHENSIVE METABOLIC PANEL
ALT: 22 U/L (ref 0–44)
AST: 27 U/L (ref 15–41)
Albumin: 2.5 g/dL — ABNORMAL LOW (ref 3.5–5.0)
Alkaline Phosphatase: 110 U/L (ref 38–126)
Anion gap: 8 (ref 5–15)
BUN: 6 mg/dL (ref 6–20)
CO2: 22 mmol/L (ref 22–32)
Calcium: 7.5 mg/dL — ABNORMAL LOW (ref 8.9–10.3)
Chloride: 102 mmol/L (ref 98–111)
Creatinine, Ser: 0.57 mg/dL (ref 0.44–1.00)
GFR, Estimated: >60 mL/min (ref >60)
Glucose, Bld: 87 mg/dL (ref 70–99)
Potassium: 3.8 mmol/L (ref 3.5–5.1)
Sodium: 132 mmol/L — ABNORMAL LOW (ref 135–145)
Total Bilirubin: 0.2 mg/dL — ABNORMAL LOW (ref 0.3–1.2)
Total Protein: 5.8 g/dL — ABNORMAL LOW (ref 6.5–8.1)

## 2021-03-17 LAB — CBC
HCT: 29.8 % — ABNORMAL LOW (ref 36.0–46.0)
Hemoglobin: 9.4 g/dL — ABNORMAL LOW (ref 12.0–15.0)
MCH: 24.9 pg — ABNORMAL LOW (ref 26.0–34.0)
MCHC: 31.5 g/dL (ref 30.0–36.0)
MCV: 79 fL — ABNORMAL LOW (ref 80.0–100.0)
Platelets: 383 10*3/uL (ref 150–400)
RBC: 3.77 MIL/uL — ABNORMAL LOW (ref 3.87–5.11)
RDW: 22.1 % — ABNORMAL HIGH (ref 11.5–15.5)
WBC: 7.8 10*3/uL (ref 4.0–10.5)
nRBC: 0.8 % — ABNORMAL HIGH (ref 0.0–0.2)

## 2021-03-17 LAB — URIC ACID: Uric Acid, Serum: 5.8 mg/dL (ref 2.5–7.1)

## 2021-03-17 MED ORDER — CHLORHEXIDINE GLUCONATE CLOTH 2 % EX PADS
6.0000 | MEDICATED_PAD | Freq: Every day | CUTANEOUS | Status: DC
  Administered 2021-03-17 – 2021-03-18 (×2): 6 via TOPICAL

## 2021-03-17 MED ORDER — SODIUM CHLORIDE 0.9% FLUSH: 10.0000 mL | INTRAVENOUS | Status: DC | PRN

## 2021-03-17 MED ORDER — SODIUM CHLORIDE 0.9% FLUSH
10.0000 mL | Freq: Two times a day (BID) | INTRAVENOUS | Status: DC
  Administered 2021-03-17: 10 mL

## 2021-03-17 MED ORDER — HEPARIN SOD (PORK) LOCK FLUSH 100 UNIT/ML IV SOLN
500.0000 [IU] | INTRAVENOUS | Status: AC | PRN
  Administered 2021-03-17: 500 [IU]
  Filled 2021-03-17: qty 5

## 2021-03-17 NOTE — Progress Notes (Signed)
Rounded on patient this morning, sitting in chair comfortably. Denies any headaches. Explained to her that we will leave epidural catheter in for one more day, will be removed tomorrow. Starts next round of chemo in 7d (next Friday); currently normal WBC levels.

## 2021-03-17 NOTE — Progress Notes (Signed)
PPD1 SVD:   S:  Pt reports feeling well/ Tolerating po/ Voiding without problems/ No n/v/ Bleeding is light/ Pain controlled withprescription NSAID's including ibuprofen (Motrin)  Newborn info live female NICU   O:  A & O x 3 / VS: Blood pressure (!) 154/88, pulse 82, temperature 98.2 F (36.8 C), temperature source Oral, resp. rate 17, height 5\' 4"  (1.626 m), weight 133.4 kg, last menstrual period 07/16/2020, SpO2 99 %, unknown if currently breastfeeding.  LABS:  Results for orders placed or performed during the hospital encounter of 03/15/21 (from the past 24 hour(s))  CBC     Status: Abnormal   Collection Time: 03/17/21  5:12 AM  Result Value Ref Range   WBC 7.8 4.0 - 10.5 K/uL   RBC 3.77 (L) 3.87 - 5.11 MIL/uL   Hemoglobin 9.4 (L) 12.0 - 15.0 g/dL   HCT 29.8 (L) 36.0 - 46.0 %   MCV 79.0 (L) 80.0 - 100.0 fL   MCH 24.9 (L) 26.0 - 34.0 pg   MCHC 31.5 30.0 - 36.0 g/dL   RDW 22.1 (H) 11.5 - 15.5 %   Platelets 383 150 - 400 K/uL   nRBC 0.8 (H) 0.0 - 0.2 %  Comprehensive metabolic panel     Status: Abnormal   Collection Time: 03/17/21  5:12 AM  Result Value Ref Range   Sodium 132 (L) 135 - 145 mmol/L   Potassium 3.8 3.5 - 5.1 mmol/L   Chloride 102 98 - 111 mmol/L   CO2 22 22 - 32 mmol/L   Glucose, Bld 87 70 - 99 mg/dL   BUN 6 6 - 20 mg/dL   Creatinine, Ser 0.57 0.44 - 1.00 mg/dL   Calcium 7.5 (L) 8.9 - 10.3 mg/dL   Total Protein 5.8 (L) 6.5 - 8.1 g/dL   Albumin 2.5 (L) 3.5 - 5.0 g/dL   AST 27 15 - 41 U/L   ALT 22 0 - 44 U/L   Alkaline Phosphatase 110 38 - 126 U/L   Total Bilirubin 0.2 (L) 0.3 - 1.2 mg/dL   GFR, Estimated >60 >60 mL/min   Anion gap 8 5 - 15  Uric acid     Status: None   Collection Time: 03/17/21  5:12 AM  Result Value Ref Range   Uric Acid, Serum 5.8 2.5 - 7.1 mg/dL    I&O: I/O last 3 completed shifts: In: 5885.4 [P.O.:1570; I.V.:3997; Other:77.4; IV Piggyback:241] Out: 5800 [Urine:5600; Blood:200]   Total I/O In: 806.7 [P.O.:340; I.V.:466.7] Out: 400  [Urine:400]  Lungs: chest clear, no wheezing, rales, normal symmetric air entry  Heart: regular rate and rhythm, S1, S2 normal, no murmur, click, rub or gallop  Abdomen:   Perineum: is normal  Lochia: light  Extremities:no redness or tenderness in the calves or thighs, no edema    A/P: PPD # 1/ [id number]  Chronic HTN on labetalol S/p magnesium sulfate IDA s/p IV venofer Stage IV right breast cancer  Doing well  Continue routine post partum orders  Anticipate d/c in am  Cont labetalol

## 2021-03-17 NOTE — Progress Notes (Signed)
0400: DBIV visit: iron infusing. Informed pt I will return around 0500 to draw labs post infusion.  0510: Returned to pt's room for labs from implanted port. Noted iron infusing at 41mL/hr (slower rate than previously observed). Pt stated she just noticed the same while in the restroom. RN [place] notified.

## 2021-03-17 NOTE — Clinical Social Work Maternal (Addendum)
CLINICAL SOCIAL WORK MATERNAL/CHILD NOTE  Patient Details  Name: Evelyn Marshall MRN: [id number] Date of Birth: 05/15/1985  Date:  08-16-2021  Clinical Social Worker Initiating Note:  Laurey Arrow Date/Time: Initiated:  03/17/21/1403     Child's Name:  Limmie Patricia   Biological Parents:  Mother, Father   Need for Interpreter:  None   Reason for Referral:  Parental Support of Premature Babies < 49 weeks/or Critically Ill babies   Address:  [address] [place] [address]    Phone number:  [phone] (home)     Additional phone number: FOB's number is [phone]  Household Members/Support Persons (HM/SP):   Household Member/Support Person 1, Household Member/Support Person 2, Household Member/Support Person 3, Household Member/Support Person 4   HM/SP Name Relationship DOB or Age  HM/SP -1 Keaghan Staton FOB/Husband 10/01/1988  HM/SP -Valentine Son 10/28/2004  HM/SP -3 Martinique Uddin son 11/15/2015  HM/SP -4 Jaxson Zingaro son 02/26/2019  HM/SP -5        HM/SP -6        HM/SP -7        HM/SP -8          Natural Supports (not living in the home):      Professional Supports: Therapist   Employment: Animator   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   Strengths:  Home prepared for child  , Ability to meet basic needs  , Pediatrician chosen   Psychotropic Medications:         Pediatrician:    [place] area  Pediatrician List:   [facility])  [place]      Pediatrician Fax Number:    Risk Factors/Current Problems:  Adjustment to Illness     Cognitive State:  Able to Concentrate  , Alert   , Insightful  , Goal Oriented  , Linear Thinking     Mood/Affect:  Comfortable  , Interested  , Happy  , Bright  , Relaxed     CSW Assessment: CSW met with MOB in room 107 to complete an assessment for NICU admission.  When CSW arrived, MOB was resting in the recliner and appeared to be comfortable. CSW explained CSW's role and MOB appeared to be receptive to meeting with CSW.   MOB was polite and easy to engage.   CSW congratulated MOB on the birth of baby [place]. Without prompting MOB shared her story of her breast cancer dx while being [redacted] weeks pregnant.  She appeared to be in good spirits and hopeful. She communicated having a good support team that consists of MOB's and FOB's immediate and extended family members.  MOB shared that she attends counseling an she relies on her faith.   CSW reviewed NICU visitation and per MOB she feels well informed by NICU team. MOB reported having all essential items to care for infant including a new car seat and bassinet.  CSW provided education regarding the baby blues period vs. perinatal mood disorders, discussed treatment and gave resources for mental health follow up if concerns arise.  CSW recommends self-evaluation during  the postpartum time period and encouraged MOB to contact a medical professional if symptoms are noted at any time.  CSW presented with insight and awareness and she did not demonstrate any acute MH symptoms.  When CSW assessed for safety, MOB denied SI, HI, and DV.   CSW will continue to offer resources and supports to family while infant remains in NICU.    CSW Plan/Description:  Psychosocial Support and Ongoing Assessment of Needs, Sudden Infant Death Syndrome (SIDS) Education, Perinatal Mood and Anxiety Disorder (PMADs) Education, Other Patient/Family Education, Other Information/Referral to [employer], MSW, [employer] Social Work [phone]

## 2021-03-18 ENCOUNTER — Ambulatory Visit: Payer: BC Managed Care – PPO

## 2021-03-18 ENCOUNTER — Other Ambulatory Visit: Payer: Self-pay

## 2021-03-18 DIAGNOSIS — C50411 Malignant neoplasm of upper-outer quadrant of right female breast: Secondary | ICD-10-CM

## 2021-03-18 DIAGNOSIS — Z170 Estrogen receptor positive status [ER+]: Secondary | ICD-10-CM

## 2021-03-18 LAB — SURGICAL PATHOLOGY

## 2021-03-18 MED ORDER — IBUPROFEN 600 MG PO TABS: 600.0000 mg | ORAL_TABLET | Freq: Four times a day (QID) | ORAL | 11 refills | Status: DC | PRN

## 2021-03-18 MED ORDER — LABETALOL HCL 200 MG PO TABS: 200.0000 mg | ORAL_TABLET | Freq: Two times a day (BID) | ORAL | 11 refills | Status: DC

## 2021-03-18 MED ORDER — NIFEDIPINE ER 30 MG PO TB24: 30.0000 mg | ORAL_TABLET | Freq: Every day | ORAL | 11 refills | Status: DC

## 2021-03-18 MED ORDER — NIFEDIPINE ER OSMOTIC RELEASE 30 MG PO TB24
30.0000 mg | ORAL_TABLET | Freq: Every day | ORAL | Status: DC
  Administered 2021-03-18: 30 mg via ORAL
  Filled 2021-03-18: qty 1

## 2021-03-18 NOTE — Lactation Note (Signed)
This note was copied from a baby's chart.  NICU Lactation Consultation Note  Patient Name: Evelyn Marshall Evelyn Marshall Date: 03/18/2021 Age:34 hours  Subjective Reason for consult: Follow-up assessment; NICU baby; Late-preterm 34-36.6wks; Other (Comment) (Breast Cancer) Lactation followed up with [name]. We discussed her progress. She mentioned that her next round of chemotherapy will begin on Friday March 2, and she will be taking a weekly dose. She will discontinue breastfeeding or providing EBM at that time. We discussed indications for engorgement, and lactation will continue to monitor her case over the next week and assist as needed.  Currently, [name] states that she has been allowing baby to nuzzle and lick at the breast for bonding purposes. She is doing some intermittent pumping. She has not seen any changes in her milk, but she is also only 45 hours post partum. She asked if her chemotherapy sessions while pregnant could affect her supply, and I provided some education regarding that.  Her plan is to continue with allowing baby Evelyn Marshall to lick and learn and to provide some colostrum, as available. I also encouraged bonding via skin to skin.  Objective  Infant feeding assessment Scale for Readiness: 3 Scale for Quality: 2    Maternal data: [id number]  Vaginal, Spontaneous  Current breast feeding challenges:: Breast cancer diagnosis - begins next chemo on Friday March 2   Does the patient have breastfeeding experience prior to this delivery?: Yes  Pumping frequency: a few times/daily Pumped volume: 0 mL    Pump: DEBP, Personal  Assessment  Maternal: Milk volume: Normal   Intervention/Plan Interventions: Education  Plan: Consult Status: Follow-up  NICU Follow-up type: New admission follow up; Verify onset of copious milk; Verify absence of engorgement    Evelyn Marshall 03/18/2021, 9:32 AM

## 2021-03-18 NOTE — Discharge Instructions (Signed)
Call if temperature greater than equal to 100.4, nothing per vagina for 4-6 weeks or severe nausea vomiting, increased incisional pain , drainage or redness in the incision site, no straining with bowel movements, showers no bathCall if temperature greater than equal to 100.4, nothing per vagina for 4-6 weeks or severe nausea vomiting, increased incisional pain , drainage or redness in the incision site, no straining with bowel movements, showers no bath 

## 2021-03-18 NOTE — Discharge Summary (Signed)
° °  Postpartum Discharge Summary ° °Date of Service updated ° °   °Patient Name: Evelyn Marshall °DOB: 08/01/1985 °MRN: [id number] ° °Date of admission: 03/15/2021 °Delivery date:03/16/2021  °Delivering provider: COUSINS, SHERONETTE  °Date of discharge: 03/18/2021 ° °Admitting diagnosis: Malignant neoplasm affecting pregnancy in third trimester [O9A.113] °Postpartum care following vaginal delivery [Z39.2] °Stage IV right breast cancer °Chronic HTN affecting pregnancy with severe features °Hx preeclampsia , currently pregnant °Intrauterine pregnancy: [redacted]w[redacted]d     °Secondary diagnosis:  Principal Problem: °  Malignant neoplasm affecting pregnancy, antepartum, third trimester °Active Problems: °  Malignant neoplasm affecting pregnancy in third trimester °  Postpartum care following vaginal delivery °Proteinuria affecting pregnancy °Additional problems: IDA affecting pregnancy, proteinuria affecting pregnancy, obesity affecting pregnancy    °Discharge diagnosis: Preterm Pregnancy Delivered , chronic HTN affecting pregnancy, IDA affecting pregnancy, Stage IV right breast cancer, malignant neoplasm affecting pregnancy, proteinuria affecting pregnancy                                         °Post partum procedures: IV iron infusion , magnesium sulfate °Augmentation: Pitocin °Complications: None ° °Hospital course: Induction of Labor With Vaginal Delivery   °35 y.o. yo [id number] at [redacted]w[redacted]d was admitted to the hospital 03/15/2021 for induction of labor.  Indication for induction:  Stage IV right breast cancer .  Patient had complicated labor course as follows: severe range BP resulted in starting magnesium sulfate for sz prophylaxis and increasing labetalol dose.  Cytotec used for cervical ripening. Pt progressed. Epidural for pain mgmt. GBS cx unknown  initially and was treated with IV PCN which was discontinued with neg GBS PCR. Pitocin augmentation °Membrane Rupture Time/Date: 12:01 PM ,03/16/2021   °Delivery Method:Vaginal, Spontaneous   °Episiotomy: None  °Lacerations:  None  °Details of delivery can be found in separate delivery note.  Patient had postpartum course notable for continuation of Magnesium sulfate for 24 hrs. Increased severe range BP resulted in IV antiHTN medication. Pt was started on procardia XL with response to BP.  Patient is discharged home on 03/18/2021 ° °Newborn Data: °Birth date:03/16/2021  °Birth time:12:02 PM  °Gender:Female  °Living status:Living  °Apgars:8 ,9  °Weight:2.41 kg  ° °Magnesium Sulfate received: Yes: Seizure prophylaxis °BMZ received: No °Rhophylac:No °MMR:No °T-DaP:Given prenatally °Flu: No °Transfusion:No ° °Physical exam  °Vitals:  ° 03/18/21 0822 03/18/21 1131 03/18/21 1634 03/18/21 1643  °BP: 138/77 (!) 144/71 (!) 165/101 136/77  °Pulse: 77 90 84 89  °Resp: 18 18 16   °Temp: 99.4 °F (37.4 °C) 98.6 °F (37 °C)    °TempSrc: Oral Oral    °SpO2:  99%    °Weight:      °Height:      ° °General: alert, cooperative, and no distress °Lochia: appropriate °Uterine Fundus: firm °Incision: N/A °DVT Evaluation: No evidence of DVT seen on physical exam. °No significant calf/ankle edema. °Labs: °Lab Results  °Component Value Date  ° WBC 7.8 03/17/2021  ° HGB 9.4 (L) 03/17/2021  ° HCT 29.8 (L) 03/17/2021  ° MCV 79.0 (L) 03/17/2021  ° PLT 383 03/17/2021  ° °CMP Latest Ref Rng & Units 03/17/2021  °Glucose 70 - 99 mg/dL 87  °BUN 6 - 20 mg/dL 6  °Creatinine 0.44 - 1.00 mg/dL 0.57  °Sodium 135 - 145 mmol/L 132(L)  °Potassium 3.5 - 5.1 mmol/L 3.8  °Chloride 98 - 111 mmol/L 102  °CO2 22 -   32 mmol/L 22  Calcium 8.9 - 10.3 mg/dL 7.5(L)  Total Protein 6.5 - 8.1 g/dL 5.8(L)  Total Bilirubin 0.3 - 1.2 mg/dL 0.2(L)  Alkaline Phos 38 - 126 U/L 110  AST 15 - 41 U/L 27  ALT 0 - 44 U/L 22   Edinburgh Score: Edinburgh Postnatal Depression Scale Screening Tool 03/17/2021  I have been able to laugh and see the funny side of things. 0  I have looked forward with enjoyment to things. 0  I have blamed myself unnecessarily when things  went wrong. 0  I have been anxious or worried for no good reason. 0  I have felt scared or panicky for no good reason. 0  Things have been getting on top of me. 0  I have been so unhappy that I have had difficulty sleeping. 0  I have felt sad or miserable. 0  I have been so unhappy that I have been crying. 0  The thought of harming myself has occurred to me. 0  Edinburgh Postnatal Depression Scale Total 0      After visit meds:  Allergies as of 03/18/2021   No Known Allergies      Medication List     TAKE these medications    Doxylamine-Pyridoxine 10-10 MG Tbec Take 1 tablet by mouth 3 (three) times daily as needed (nausea).   Fusion Plus Caps Take 1 capsule by mouth daily.   ibuprofen 600 MG tablet Commonly known as: ADVIL Take 1 tablet (600 mg total) by mouth every 6 (six) hours as needed for mild pain or moderate pain.   labetalol 200 MG tablet Commonly known as: NORMODYNE Take 1 tablet (200 mg total) by mouth 2 (two) times daily.   NIFEdipine 30 MG 24 hr tablet Commonly known as: ADALAT CC Take 1 tablet (30 mg total) by mouth daily. Start taking on: March 19, 2021         Discharge home in stable condition Infant Feeding: Bottle and Breast Infant Disposition:NICU Discharge instruction: per After Visit Summary and Postpartum booklet. Activity: Advance as tolerated. Pelvic rest for 6 weeks.  Diet: iron rich diet Anticipated Birth Control: Condoms Postpartum Appointment:6 weeks Additional Postpartum F/U:  [email or web address], general surgeon Future Appointments: Future Appointments  Date Time Provider [place]  03/24/2021  9:15 AM [facility] [place] None  03/24/2021  9:45 AM [place]ia Phlegm, NP [facility] None  03/25/2021  7:30 AM [facility] INFUSION [facility] None  03/31/2021  9:45 AM [facility] [place] FLUSH [facility] None  03/31/2021 10:15 AM Nicholas Lose, MD [facility] None  03/31/2021 11:00 AM [facility] INFUSION [facility] None   04/07/2021  9:15 AM [facility] [place] FLUSH [facility] None  04/07/2021 10:00 AM [facility] INFUSION [facility] None  04/14/2021 11:15 AM [facility] [place] FLUSH [facility] None  04/14/2021 11:45 AM Causey, Charlestine Massed, NP [facility] None  04/14/2021 12:30 PM [facility] INFUSION [facility] None  04/21/2021  8:15 AM [facility] [place] FLUSH [facility] None  04/21/2021  8:45 AM [facility] INFUSION [facility] None  04/28/2021 11:15 AM [facility] [place] FLUSH [facility] None  04/28/2021 11:45 AM Nicholas Lose, MD [facility] None  04/28/2021 12:30 PM [facility] INFUSION [facility] None  05/30/2021  3:00 PM Suanne Marker, PTA [facility] None   Follow up Visit:  Follow-up Information     Servando Salina, MD Follow up in 6 week(s).   Specialty: Obstetrics and Gynecology Contact information: [address] [place] [address] [phone]  03/18/2021 °Sheronette A Cousins, MD ° ° °

## 2021-03-18 NOTE — Progress Notes (Signed)
PPD2 SVD:   S:  Pt reports feeling well denies h/a, visual changes/ Tolerating po/ Voiding without problems/ No n/v/ Bleeding is light/ Pain controlled withprescription NSAID's including ibuprofen (Motrin)  Newborn info live female NICU   O:  A & O x 3 / VS: Blood pressure 136/77, pulse 89, temperature 98.6 F (37 C), temperature source Oral, resp. rate 16, height 5\' 4"  (1.626 m), weight 133.4 kg, last menstrual period 07/16/2020, SpO2 99 %, unknown if currently breastfeeding.  LABS: No results found for this or any previous visit (from the past 24 hour(s)).  I&O: I/O last 3 completed shifts: In: 3186.9 [P.O.:1270; I.V.:1675.9; IV Piggyback:241] Out: 2850 [Urine:2850]   Total I/O In: 120 [P.O.:120] Out: 600 [Urine:600]  Lungs: chest clear, no wheezing, rales, normal symmetric air entry  Heart: regular rate and rhythm, S1, S2 normal, no murmur, click, rub or gallop  Abdomen: soft uterus firm at umb  Perineum: is normal  Lochia: light  Extremities:no redness or tenderness in the calves or thighs, no edema    A/P: PPD # 2/ [id number] S/P Preterm delivery  due to IOL for stage IV right breast cancer  Chronic HTN on labetalol, procardia XL IDA s/p IV iron infusion  Doing well  Continue routine post partum orders D/c home F/u with cardiology for BP mgmt F/u heme/onc for continued  chemotherapy, iDA mgmt F/u Dr Donne Hazel for breast cancer surgery F/u pp 6 wk

## 2021-03-20 ENCOUNTER — Ambulatory Visit: Payer: Self-pay

## 2021-03-20 NOTE — Lactation Note (Signed)
This note was copied from a baby's chart.  NICU Lactation Consultation Note  Patient Name: Lindey Renzulli [id number] Date: 03/20/2021 Age:36 years   Subjective Reason for consult: Follow-up assessment; NICU baby  Lactation conducted brief visit with Ms. Trotti. Baby Vella Raring is 4 days post partum. She reports no change in milk volume. Baby is taking bottles and doing some lick and learn. No questions or concerns at this time.   Objective Infant data:  Infant feeding assessment Scale for Readiness: 3 Scale for Quality: 2 (presented strong cues at the start of feed quickly fatigued with progression, attempted to unswaddle patient remained uninterested, so feeding stopped.)  Maternal data: [id number]  Vaginal, Spontaneous   Pump: DEBP, Personal   Intervention/Plan Plan: Consult Status: Follow-up  NICU Follow-up type: Verify absence of engorgement    Lenore Manner 03/20/2021, 3:21 PM

## 2021-03-21 ENCOUNTER — Inpatient Hospital Stay (HOSPITAL_COMMUNITY): Admission: RE | Admit: 2021-03-21 | Payer: BC Managed Care – PPO | Source: Ambulatory Visit

## 2021-03-22 ENCOUNTER — Telehealth: Payer: Self-pay | Admitting: Hematology and Oncology

## 2021-03-22 ENCOUNTER — Ambulatory Visit: Payer: BC Managed Care – PPO | Admitting: Hematology and Oncology

## 2021-03-22 NOTE — Telephone Encounter (Signed)
Scheduled appointment per 2/20 los. Patient is aware of upcoming appointments.

## 2021-03-24 ENCOUNTER — Encounter: Payer: Self-pay | Admitting: Adult Health

## 2021-03-24 ENCOUNTER — Other Ambulatory Visit: Payer: Self-pay

## 2021-03-24 ENCOUNTER — Inpatient Hospital Stay (HOSPITAL_BASED_OUTPATIENT_CLINIC_OR_DEPARTMENT_OTHER): Payer: BC Managed Care – PPO | Admitting: Adult Health

## 2021-03-24 ENCOUNTER — Ambulatory Visit: Payer: BC Managed Care – PPO

## 2021-03-24 ENCOUNTER — Inpatient Hospital Stay: Payer: BC Managed Care – PPO | Attending: Hematology and Oncology

## 2021-03-24 ENCOUNTER — Ambulatory Visit (HOSPITAL_COMMUNITY)
Admission: RE | Admit: 2021-03-24 | Discharge: 2021-03-24 | Disposition: A | Discharge status: Home / Self Care | Payer: BC Managed Care – PPO | Source: Ambulatory Visit | Attending: Hematology and Oncology | Admitting: Hematology and Oncology

## 2021-03-24 VITALS — BP 167/103 | HR 79 | Temp 97.9°F | Resp 16 | Ht 64.0 in | Wt 278.9 lb

## 2021-03-24 DIAGNOSIS — Z01818 Encounter for other preprocedural examination: Secondary | ICD-10-CM | POA: Diagnosis present

## 2021-03-24 DIAGNOSIS — Z170 Estrogen receptor positive status [ER+]: Secondary | ICD-10-CM | POA: Insufficient documentation

## 2021-03-24 DIAGNOSIS — K449 Diaphragmatic hernia without obstruction or gangrene: Secondary | ICD-10-CM | POA: Insufficient documentation

## 2021-03-24 DIAGNOSIS — Z0189 Encounter for other specified special examinations: Secondary | ICD-10-CM

## 2021-03-24 DIAGNOSIS — Z5111 Encounter for antineoplastic chemotherapy: Secondary | ICD-10-CM | POA: Diagnosis present

## 2021-03-24 DIAGNOSIS — Z95828 Presence of other vascular implants and grafts: Secondary | ICD-10-CM

## 2021-03-24 DIAGNOSIS — C50411 Malignant neoplasm of upper-outer quadrant of right female breast: Secondary | ICD-10-CM

## 2021-03-24 DIAGNOSIS — N852 Hypertrophy of uterus: Secondary | ICD-10-CM | POA: Diagnosis not present

## 2021-03-24 DIAGNOSIS — N3941 Urge incontinence: Secondary | ICD-10-CM | POA: Diagnosis not present

## 2021-03-24 DIAGNOSIS — Z5112 Encounter for antineoplastic immunotherapy: Secondary | ICD-10-CM | POA: Diagnosis present

## 2021-03-24 DIAGNOSIS — I10 Essential (primary) hypertension: Secondary | ICD-10-CM

## 2021-03-24 DIAGNOSIS — R197 Diarrhea, unspecified: Secondary | ICD-10-CM | POA: Diagnosis not present

## 2021-03-24 DIAGNOSIS — O1413 Severe pre-eclampsia, third trimester: Secondary | ICD-10-CM | POA: Diagnosis not present

## 2021-03-24 DIAGNOSIS — D72819 Decreased white blood cell count, unspecified: Secondary | ICD-10-CM | POA: Diagnosis not present

## 2021-03-24 DIAGNOSIS — I517 Cardiomegaly: Secondary | ICD-10-CM | POA: Insufficient documentation

## 2021-03-24 LAB — CBC WITH DIFFERENTIAL (CANCER CENTER ONLY)
Abs Immature Granulocytes: 0.03 10*3/uL (ref 0.00–0.07)
Basophils Absolute: 0.1 10*3/uL (ref 0.0–0.1)
Basophils Relative: 1 %
Eosinophils Absolute: 0.1 10*3/uL (ref 0.0–0.5)
Eosinophils Relative: 2 %
HCT: 33 % — ABNORMAL LOW (ref 36.0–46.0)
Hemoglobin: 10.6 g/dL — ABNORMAL LOW (ref 12.0–15.0)
Immature Granulocytes: 1 %
Lymphocytes Relative: 14 %
Lymphs Abs: 0.6 10*3/uL — ABNORMAL LOW (ref 0.7–4.0)
MCH: 25.1 pg — ABNORMAL LOW (ref 26.0–34.0)
MCHC: 32.1 g/dL (ref 30.0–36.0)
MCV: 78.2 fL — ABNORMAL LOW (ref 80.0–100.0)
Monocytes Absolute: 0.8 10*3/uL (ref 0.1–1.0)
Monocytes Relative: 18 %
Neutro Abs: 3 10*3/uL (ref 1.7–7.7)
Neutrophils Relative %: 64 %
Platelet Count: 368 10*3/uL (ref 150–400)
RBC: 4.22 MIL/uL (ref 3.87–5.11)
RDW: 21 % — ABNORMAL HIGH (ref 11.5–15.5)
WBC Count: 4.6 10*3/uL (ref 4.0–10.5)
nRBC: 0 % (ref 0.0–0.2)

## 2021-03-24 LAB — ECHOCARDIOGRAM COMPLETE
Area-P 1/2: 3 cm2
Calc EF: 64 %
S' Lateral: 2.9 cm
Single Plane A2C EF: 64 %
Single Plane A4C EF: 64.2 %

## 2021-03-24 LAB — IRON AND IRON BINDING CAPACITY (CC-WL,HP ONLY)
Iron: 81 ug/dL (ref 28–170)
Saturation Ratios: 20 % (ref 10.4–31.8)
TIBC: 406 ug/dL (ref 250–450)
UIBC: 325 ug/dL (ref 148–442)

## 2021-03-24 LAB — FERRITIN: Ferritin: 514 ng/mL — ABNORMAL HIGH (ref 11–307)

## 2021-03-24 LAB — CMP (CANCER CENTER ONLY)
ALT: 43 U/L (ref 0–44)
AST: 38 U/L (ref 15–41)
Albumin: 3.7 g/dL (ref 3.5–5.0)
Alkaline Phosphatase: 105 U/L (ref 38–126)
Anion gap: 8 (ref 5–15)
BUN: 18 mg/dL (ref 6–20)
CO2: 25 mmol/L (ref 22–32)
Calcium: 9.3 mg/dL (ref 8.9–10.3)
Chloride: 103 mmol/L (ref 98–111)
Creatinine: 0.63 mg/dL (ref 0.44–1.00)
GFR, Estimated: >60 mL/min (ref >60)
Glucose, Bld: 81 mg/dL (ref 70–99)
Potassium: 4.3 mmol/L (ref 3.5–5.1)
Sodium: 136 mmol/L (ref 135–145)
Total Bilirubin: 0.4 mg/dL (ref 0.3–1.2)
Total Protein: 7.2 g/dL (ref 6.5–8.1)

## 2021-03-24 LAB — GLUCOSE, CAPILLARY: Glucose-Capillary: 88 mg/dL (ref 70–99)

## 2021-03-24 IMAGING — CT NM PET TUM IMG INITIAL (PI) SKULL BASE T - THIGH
7 of 8 series · 20 of 25 positions shown · non-contrast
Comparison: No recent relevant priors available at time dictation
for comparison.

CLINICAL DATA: Initial treatment strategy for right breast cancer.

EXAM:
NUCLEAR MEDICINE PET SKULL BASE TO THIGH
TECHNIQUE: 13.44 mCi F-18 FDG was injected intravenously. Full-ring PET imaging
was performed from the skull base to thigh after the radiotracer. CT
data was obtained and used for attenuation correction and anatomic
localization.
Fasting blood glucose: 88 mg/dl

[Series 3: pet sk_thigh ac · axial · 5.0mm · 4.07mm/px · z∈[-1367,-423]mm · 5 of 237 slices shown]
[im 1/237]
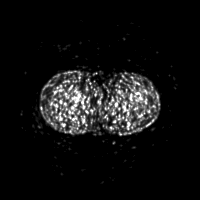
[im 48/237]
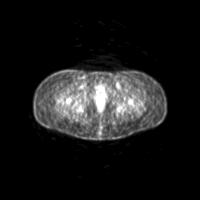
[im 142/237]
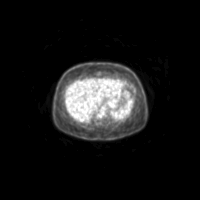
[im 189/237]
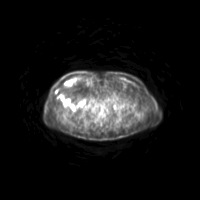
[im 237/237]
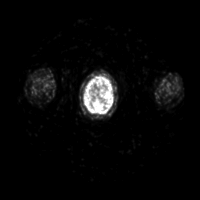

[Series 4: ct sk_thigh 5.0 hd_fov · axial · 5.0mm · 1.17mm/px · z∈[-1367,-423]mm · 4 of 237 slices shown]
[im 1/237]
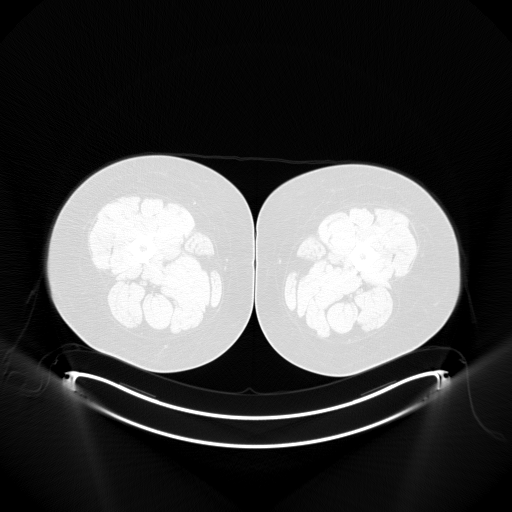
[im 119/237]
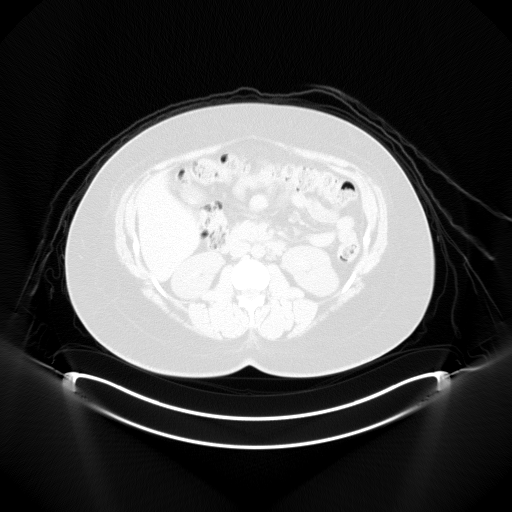
[im 178/237]
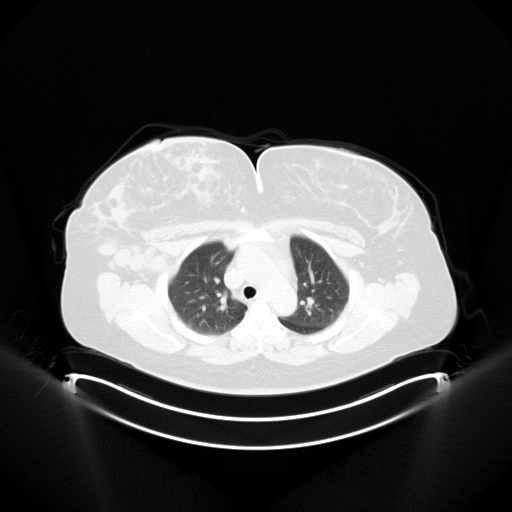
[im 237/237]
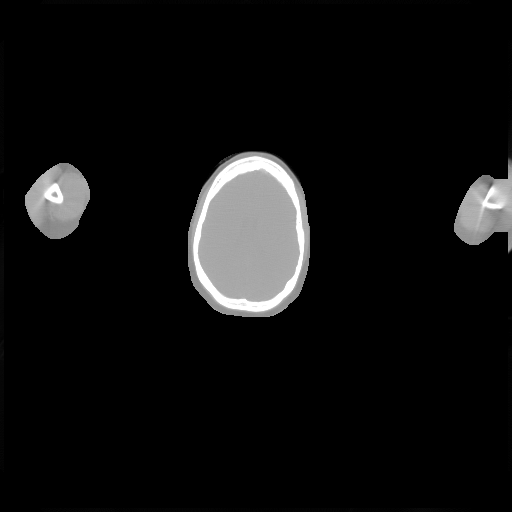

[Series 5: pet sk_thigh nac · axial · 5.0mm · 4.07mm/px · z∈[-1367,-423]mm · 4 of 237 slices shown]
[im 1/237]
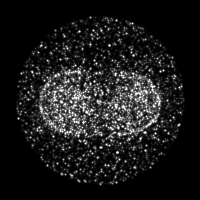
[im 119/237]
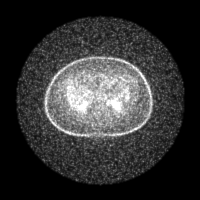
[im 178/237]
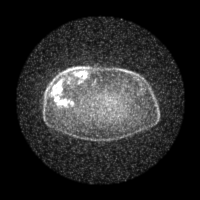
[im 237/237]
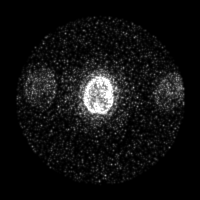

[Series 8: ct sk_thigh 5.0 br59 (id)_bone · axial · 5.0mm · 0.70mm/px · 1 of 60 slices shown]
[im 1/60]
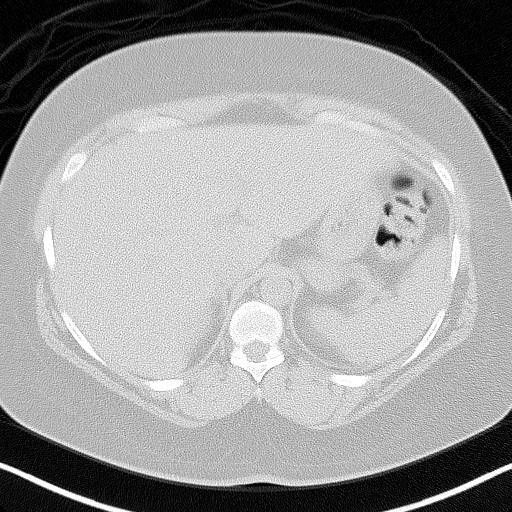

[Series 604: fused cor · 1 of 65 slices shown]
[im 1/65]
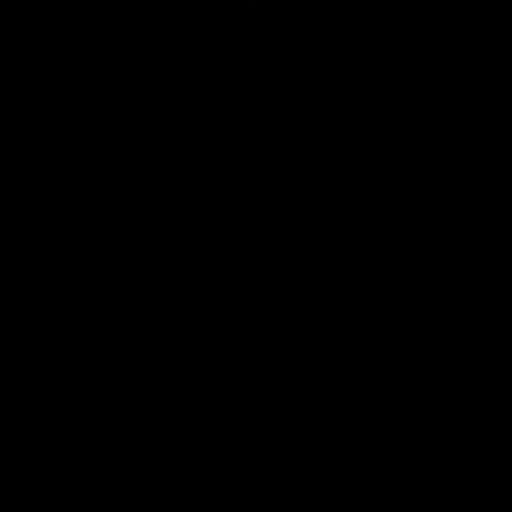

[Series 605: range-ct sk_thigh 5.0 hd_fov-tra-<alpha range> · 4 of 228 slices shown]
[im 1/228]
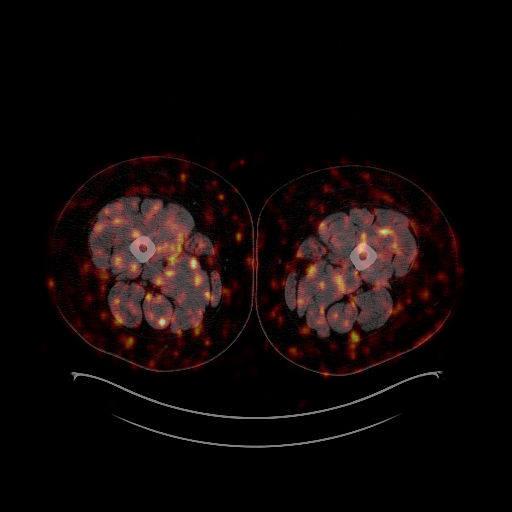
[im 57/228]
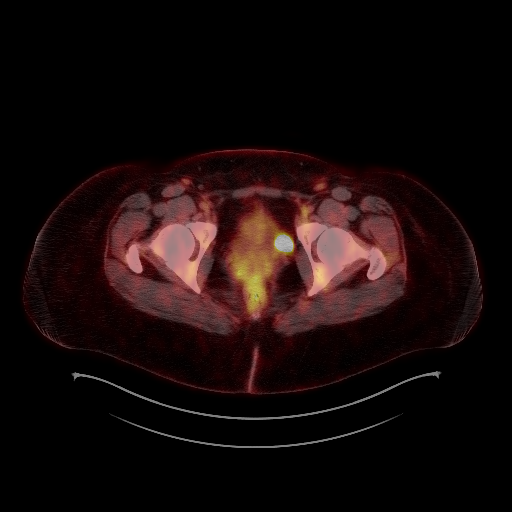
[im 114/228]
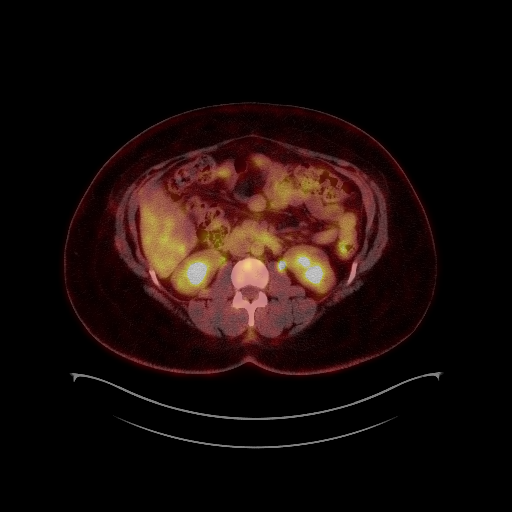
[im 228/228]
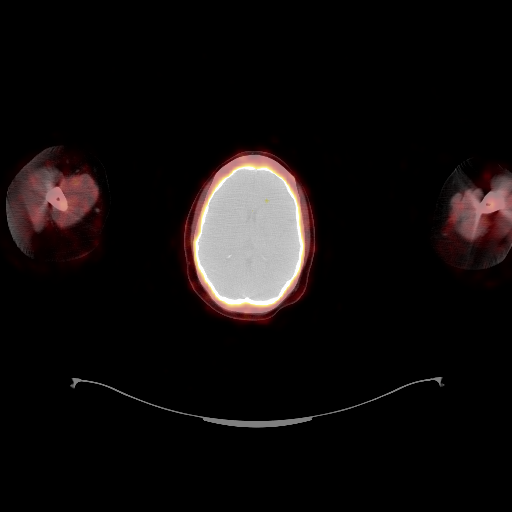

[Series 1096: results mm oncology reading · 1.2mm · 0.54mm/px · 1 of 6 slices shown]
[im 1/6]
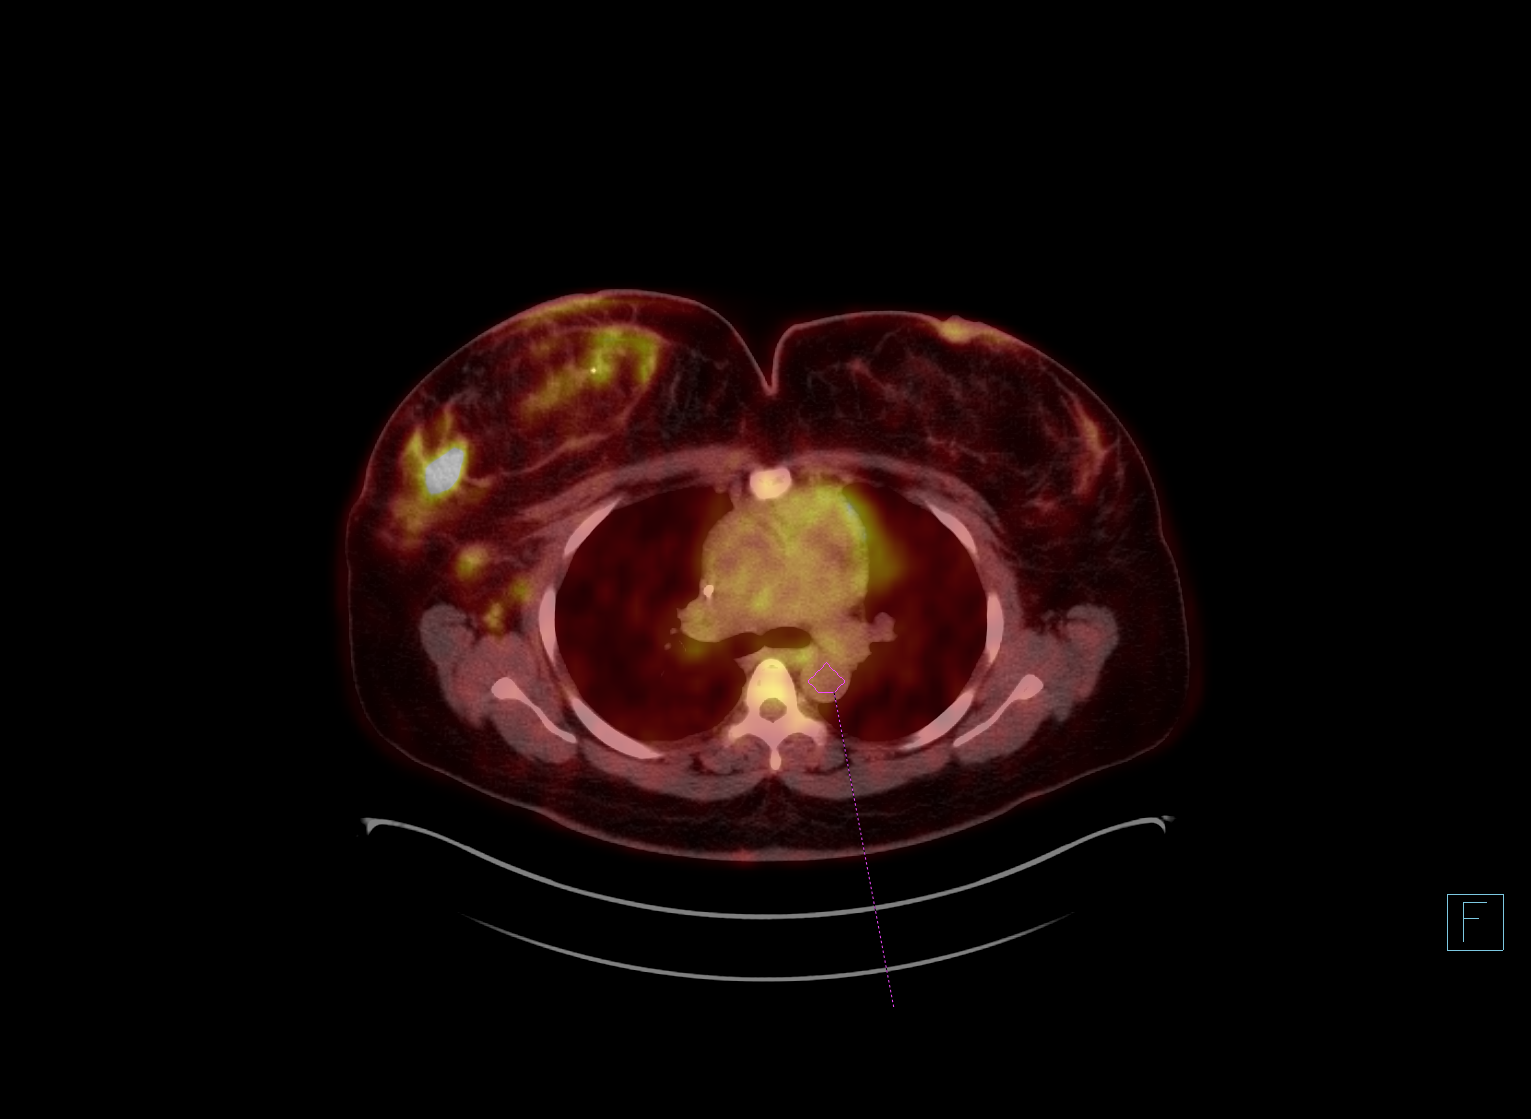

[20 of 25 positions shown; findings below may reference images not displayed]

FINDINGS: Mediastinal blood pool activity: SUV max

Liver activity: SUV max NA

NECK: No hypermetabolic cervical adenopathy.

Incidental CT findings: none

CHEST: Multifocal abnormal hypermetabolic activity throughout the
right breast for instance in ill-defined area of soft tissue in the
anterolateral right breast associated with a biopsy clip
demonstrates a max SUV of 18.26 on axial fused image 186/605.

There are hypermetabolic right axillary, subpectoral, bilateral
supraclavicular, and mediastinal lymph nodes. For reference:

-hypermetabolic right axillary lymph node measuring 19 mm in short
axis on image 48/4 with a max SUV of

-hypermetabolic left supraclavicular lymph node measures 8 mm in
short axis on image 46/4 with a max SUV of 7

-hypermetabolic right paratracheal lymph node measures 7 mm in short
axis on image 54/4 with a max SUV of 8.6.

Small to moderate hiatal hernia with a focus of abnormal
hypermetabolic activity along the distal esophagus without CT
correlate and a max SUV of 7.2 on axial fused image 155/605.

Incidental CT findings: No suspicious pulmonary nodules or masses on
this motion degraded examination. Mild cardiac enlargement. Left
chest wall Port-A-Cath with tip in the SVC.

ABDOMEN/PELVIS: Heterogeneous hypermetabolic activity within an
enlarged uterus likely reflects changes associated with recent
gestation.

No abnormal hypermetabolic activity within the liver, pancreas,
adrenal glands, or spleen.

No hypermetabolic lymph nodes in the abdomen or pelvis.

Incidental CT findings: Unremarkable noncontrast appearance of the
hepatic, splenic and pancreatic parenchyma. No hydronephrosis. No
bowel obstruction.

SKELETON: Diffuse marrow uptake throughout the axial and proximal
appendicular skeleton without focal lytic or blastic lesion
visualized on CT.

Incidental CT findings: none
IMPRESSION: 1. Multifocal abnormal hypermetabolic activity in the right breast
consistent with patient's known primary breast malignancy.

2. Hypermetabolic right axillary, subpectoral, bilateral
supraclavicular and mediastinal lymph nodes consistent with disease
involvement.

3. Diffuse hypermetabolic marrow activity throughout the axial and
visualized proximal appendicular skeleton without discrete osseous
lesion seen on CT is favored to reflect marrow stimulation.

4. Small to moderate hiatal hernia with a focus of abnormal
hypermetabolic activity along the distal esophagus without CT
correlate possibly reflecting physiologic uptake or focal gastritis.
However while less likely a focal lesion can not be excluded
consider further evaluation with upper endoscopy if clinically
indicated.

5. Heterogeneous hypermetabolic activity throughout an enlarged
uterus is favored to reflect changes associated with recent
gestation.

## 2021-03-24 MED ORDER — SODIUM CHLORIDE 0.9% FLUSH
10.0000 mL | Freq: Once | INTRAVENOUS | Status: AC
  Administered 2021-03-24: 10 mL

## 2021-03-24 MED ORDER — HEPARIN SOD (PORK) LOCK FLUSH 100 UNIT/ML IV SOLN
500.0000 [IU] | Freq: Once | INTRAVENOUS | Status: AC
  Administered 2021-03-24: 500 [IU]

## 2021-03-24 MED FILL — Dexamethasone Sodium Phosphate Inj 100 MG/10ML: INTRAMUSCULAR | Qty: 1 | Status: AC

## 2021-03-24 NOTE — Progress Notes (Signed)
[facility] Follow up:    Evelyn Marshall, [place] [address]   DIAGNOSIS:  Cancer Staging  Malignant neoplasm of upper-outer quadrant of right breast in female, estrogen receptor positive ([place]) Staging form: Breast, AJCC 8th Edition - Clinical: Stage IV (cT3, cN2, pM1, G2, ER+, PR-, HER2+) - Signed by Nicholas Lose, MD on 12/15/2020 Histologic grading system: 3 grade system   SUMMARY OF ONCOLOGIC HISTORY: Oncology History  Malignant neoplasm of upper-outer quadrant of right breast in female, estrogen receptor positive ([place])  12/08/2020 Initial Diagnosis   21-week pregnancy: MRI at [facility] noncontrast revealed right axillary mass 3.7 cm, subpectoral lymph nodes, left supraclavicular lymph node, multiple level 2 and 3 lymph nodes in the right axilla, 2 breast masses 1.1 cm largest size.  Mammogram in [place] revealed 3 small masses at 12:00 1.2 cm, 0.6 cm, 0.9 cm and 5 abnormal axillary lymph nodes, pleomorphic calcifications measuring 10 cm, biopsy revealed grade 2 IDC with necrosis and calcifications ER 30%, PR 0%, HER2 positive, Ki-67 30 to 40%   12/15/2020 Cancer Staging   Staging form: Breast, AJCC 8th Edition - Clinical: Stage IV (cT3, cN2, pM1, G2, ER+, PR-, HER2+) - Signed by Nicholas Lose, MD on 12/15/2020 Histologic grading system: 3 grade system    12/24/2020 - 02/23/2021 Chemotherapy   Patient is on Treatment Plan : BREAST Adjuvant AC q21d     12/29/2020 Genetic Testing   Negative hereditary cancer genetic testing: no pathogenic variants detected in [facility] BRCAPlus Panel or [facility] CustomNext-Cancer +RNAinsight Panel.  The report dates are 12/23/2020 and 12/29/2020.   The BRCAplus panel offered by [employer] and includes sequencing and deletion/duplication analysis for the following 8 genes: ATM, BRCA1, BRCA2, CDH1, CHEK2, PALB2, PTEN, and TP53.  The CustomNext-Cancer+RNAinsight panel offered by [facility] includes sequencing and  rearrangement analysis for the following 47 genes:  APC, ATM, AXIN2, BARD1, BMPR1A, BRCA1, BRCA2, BRIP1, CDH1, CDK4, CDKN2A, CHEK2, DICER1, EPCAM, GREM1, HOXB13, MEN1, MLH1, MSH2, MSH3, MSH6, MUTYH, NBN, NF1, NF2, NTHL1, PALB2, PMS2, POLD1, POLE, PTEN, RAD51C, RAD51D, RECQL, RET, SDHA, SDHAF2, SDHB, SDHC, SDHD, SMAD4, SMARCA4, STK11, TP53, TSC1, TSC2, and VHL.  RNA data is routinely analyzed for use in variant interpretation for all genes.   03/25/2021 -  Chemotherapy   Patient is on Treatment Plan : BREAST AC q21 days / Trastuzumab + Pertuzumab + Weekly Paclitaxel q21d       CURRENT THERAPY: Taxol/Herceptin/Perjeta  INTERVAL HISTORY: Evelyn Marshall N [place] 36 y.o. female returns for evaluation of her stage IV ER positive HER2 positive breast cancer.  She delivered her baby boy last week.  She is here today to start treatment with Taxol Herceptin and Perjeta.  She received Adriamycin and Cytoxan x4 during her pregnancy.  Evelyn Marshall underwent an echocardiogram this morning.  She is scheduled to undergo a PET scan this afternoon.  Evelyn Marshall is doing well today.  She delivered her baby boy about a week ago, named Evelyn Marshall.  He is doing well.  He is currently in the NICU and focused on gaining weight.  She tells me that her milk never came in.  She also tells me that she has had less vaginal bleeding she said her pad count is 2 to 3/day however there is spotting and its not always evident that she has had any bleeding on the pads when she changes them.   Patient Active Problem List   Diagnosis Date Noted   Postpartum care following vaginal delivery 03/16/2021  Malignant neoplasm affecting pregnancy in third trimester 03/15/2021   Malignant neoplasm affecting pregnancy, antepartum, third trimester 03/13/2021   Port-A-Cath in place 12/31/2020   Genetic testing 12/27/2020   Malignant neoplasm of upper-outer quadrant of right breast in female, estrogen receptor positive ([place]) 12/15/2020   Family history of breast  cancer 12/15/2020   Severe preeclampsia, third trimester 02/25/2019   Hyperemesis gravidarum 08/20/2018   Preeclampsia, third trimester 11/15/2015   Pre-eclampsia during pregnancy in third trimester, antepartum 11/15/2015   Preeclampsia 11/15/2015   Mild preeclampsia 11/12/2015   Hypokalemia, inadequate intake 06/17/2015   Ptyalism 05/18/2015   Chronic hypertension in pregnancy 05/18/2015   Morbid obesity with BMI of 40.0-44.9, adult ([place]) 05/18/2015   Rh negative, maternal 05/18/2015    has No Known Allergies.  MEDICAL HISTORY: Past Medical History:  Diagnosis Date   Abnormal Pap smear 2012   colpo result benign   Cancer ([place])    Chlamydia    Family history of breast cancer 12/15/2020   History of pre-eclampsia    HPV (human papilloma virus) infection    Hyperemesis    Hypokalemia    Pregnancy induced hypertension    Ptyalism    Vaginal Pap smear, abnormal     SURGICAL HISTORY: Past Surgical History:  Procedure Laterality Date   COLPOSCOPY     IR IMAGING GUIDED PORT INSERTION  12/24/2020   WISDOM TOOTH EXTRACTION      SOCIAL HISTORY: Social History   Socioeconomic History   Marital status: Married    Spouse name: Belenda Cruise   Number of children: 3   Years of education: 16   Highest education level: Bachelor's degree (e.g., BA, AB, BS)  Occupational History   Occupation: Pharmacist, hospital  Tobacco Use   Smoking status: Never   Smokeless tobacco: Never  Vaping Use   Vaping Use: Never used  Substance and Sexual Activity   Alcohol use: No   Drug use: No   Sexual activity: Yes  Other Topics Concern   Not on file  Social History Narrative   Not on file   Social Determinants of Health   Financial Resource Strain: Not on file  Food Insecurity: Not on file  Transportation Needs: Not on file  Physical Activity: Not on file  Stress: Not on file  Social Connections: Not on file  Intimate Partner Violence: Not on file    FAMILY HISTORY: Family History  Problem  Relation Age of Onset   Heart disease Father    Hypertension Father    Stroke Father    Breast cancer Paternal Grandmother        dx 60s   Stroke Paternal Grandmother    Breast cancer Cousin 20       maternal female cousin    Review of Systems  Constitutional:  Negative for appetite change, chills, fatigue, fever and unexpected weight change.  HENT:   Negative for hearing loss, lump/mass and trouble swallowing.   Eyes:  Negative for eye problems and icterus.  Respiratory:  Negative for chest tightness, cough and shortness of breath.   Cardiovascular:  Negative for chest pain, leg swelling and palpitations.  Gastrointestinal:  Negative for abdominal distention, abdominal pain, constipation, diarrhea, nausea and vomiting.  Endocrine: Negative for hot flashes.  Genitourinary:  Negative for difficulty urinating.   Musculoskeletal:  Negative for arthralgias.  Skin:  Negative for itching and rash.  Neurological:  Negative for dizziness, extremity weakness, headaches and numbness.  Hematological:  Negative for adenopathy. Does not bruise/bleed easily.  Psychiatric/Behavioral:  Negative for depression. The patient is not nervous/anxious.      PHYSICAL EXAMINATION  ECOG PERFORMANCE STATUS: 1 - Symptomatic but completely ambulatory  Vitals:   03/24/21 1003  BP: (!) 167/103  Pulse: 79  Resp: 16  Temp: 97.9 F (36.6 C)  SpO2: 99%    Physical Exam Constitutional:      General: She is not in acute distress.    Appearance: Normal appearance. She is not toxic-appearing.  HENT:     Head: Normocephalic and atraumatic.  Eyes:     General: No scleral icterus. Cardiovascular:     Rate and Rhythm: Normal rate and regular rhythm.     Pulses: Normal pulses.     Heart sounds: Normal heart sounds.  Pulmonary:     Effort: Pulmonary effort is normal.     Breath sounds: Normal breath sounds.  Abdominal:     General: Abdomen is flat. Bowel sounds are normal. There is no distension.      Palpations: Abdomen is soft.     Tenderness: There is no abdominal tenderness.  Musculoskeletal:        General: No swelling.     Cervical back: Neck supple.  Lymphadenopathy:     Cervical: No cervical adenopathy.  Skin:    General: Skin is warm and dry.     Findings: No rash.  Neurological:     General: No focal deficit present.     Mental Status: She is alert.  Psychiatric:        Mood and Affect: Mood normal.        Behavior: Behavior normal.    LABORATORY DATA:  CBC    Component Value Date/Time   WBC 4.6 03/24/2021 0941   WBC 7.8 03/17/2021 0512   RBC 4.22 03/24/2021 0941   HGB 10.6 (L) 03/24/2021 0941   HCT 33.0 (L) 03/24/2021 0941   PLT 368 03/24/2021 0941   MCV 78.2 (L) 03/24/2021 0941   MCH 25.1 (L) 03/24/2021 0941   MCHC 32.1 03/24/2021 0941   RDW 21.0 (H) 03/24/2021 0941   LYMPHSABS 0.6 (L) 03/24/2021 0941   MONOABS 0.8 03/24/2021 0941   EOSABS 0.1 03/24/2021 0941   BASOSABS 0.1 03/24/2021 0941    CMP     Component Value Date/Time   NA 136 03/24/2021 0941   K 4.3 03/24/2021 0941   CL 103 03/24/2021 0941   CO2 25 03/24/2021 0941   GLUCOSE 81 03/24/2021 0941   BUN 18 03/24/2021 0941   CREATININE 0.63 03/24/2021 0941   CALCIUM 9.3 03/24/2021 0941   PROT 7.2 03/24/2021 0941   ALBUMIN 3.7 03/24/2021 0941   AST 38 03/24/2021 0941   ALT 43 03/24/2021 0941   ALKPHOS 105 03/24/2021 0941   BILITOT 0.4 03/24/2021 0941   GFRNONAA >60 03/24/2021 0941   GFRAA >60 02/28/2019 1610      ASSESSMENT and THERAPY PLAN:   Malignant neoplasm of upper-outer quadrant of right breast in female, estrogen receptor positive ([place]) 12/08/2020:21-week pregnancy: MRI at [facility] noncontrast revealed right axillary mass 3.7 cm, subpectoral lymph nodes, left supraclavicular lymph node, multiple level 2 and 3 lymph nodes in the right axilla, 2 breast masses 1.1 cm largest size.  Mammogram in [place] revealed 3 small masses at 12:00 1.2 cm, 0.6 cm, 0.9 cm and 5 abnormal axillary  lymph nodes, pleomorphic calcifications measuring 10 cm, biopsy revealed grade 2 IDC with necrosis and calcifications ER 30%, PR 0%, HER2 positive, Ki-67 30 to 40%   Treatment  plan: 1.  Neoadjuvant chemotherapy with Adriamycin and Cytoxan every 3 weeks x4 started 12/24/2020 completed 02/23/2021 2. delivery of the baby 3.  Continue neoadjuvant therapy with Taxol Herceptin and Perjeta 4.  Mastectomy with ALND versus breast conserving surgery if she has a dramatic response 5.  Adjuvant radiation therapy 6.  Adjuvant antiestrogen therapy with ovarian function suppression Genetic testing ---------------------------------------------------------------------------------------------------------------------------------- Ultrasound breast [facility]: 1.6 cm mass at 12:00 (previously 1.7, 1 mass disappeared, mass 1:30 position: 1.3 cm (was previously 1 cm), another mass 1 cm (previously 0.6 cm), mass at 11:00: 1.1 cm (previously 1.1 cm) right axillary lymph node stable 1 lymph node 2.9 cm (was 3.1 cm)  Evelyn Marshall has delivered a healthy boy who is in the NICU working on his weight gain but expected to be discharged in the coming weeks.  She is going to undergo several staging tests since we were unable to do these when she was expecting.  Today she underwent an echocardiogram with the results pending, along with a PET scan that will occur this afternoo  Tomorrow she will start treatment with Taxol, Herceptin, Perjeta, and this weekend she will undergo bilateral breast MRI.  We discussed her chemotherapy, side effects, risks and benefits.  She understands everything and verbalized agreement to proceed with her treatment.  We discussed her birth control and she and her partner should use condoms as contraception.  She and I discussed her hypertension as it is uncontrolled on her current medication regimen.  I placed a referral to cardiology, the hypertension clinic.  I reviewed return tomorrow for chemotherapy and next  week for labs, follow-up with Dr. Lindi Adie, Taxol.   All questions were answered. The patient knows to call the clinic with any problems, questions or concerns. We can certainly see the patient much sooner if necessary.  Total encounter time: 30 minutes in face-to-face visit time, chart review, lab review, care coordination, order entry, and documentation of the encounter.  Wilber Bihari, NP 03/25/21 1:41 PM Medical Oncology and Hematology [facility] [place], [place] [address] Tel. [phone]    Fax. [phone]  *Total Encounter Time as defined by the Centers for Medicare and Medicaid Services includes, in addition to the face-to-face time of a patient visit (documented in the note above) non-face-to-face time: obtaining and reviewing outside history, ordering and reviewing medications, tests or procedures, care coordination (communications with other health care professionals or caregivers) and documentation in the medical record.

## 2021-03-24 NOTE — Assessment & Plan Note (Addendum)
12/08/2020:21-week pregnancy: MRI at [facility] noncontrast revealed right axillary mass 3.7 cm, subpectoral lymph nodes, left supraclavicular lymph node, multiple level 2 and 3 lymph nodes in the right axilla, 2 breast masses 1.1 cm largest size. ?Mammogram in [place] revealed 3 small masses at 12:00 1.2 cm, 0.6 cm, 0.9 cm and 5 abnormal axillary lymph nodes, pleomorphic calcifications measuring 10 cm, biopsy revealed grade 2 IDC with necrosis and calcifications ER 30%, PR 0%, HER2 positive, Ki-67 30 to 40% ?? ?Treatment plan: ?1. ?Neoadjuvant chemotherapy with Adriamycin and Cytoxan every 3 weeks x4?started 12/24/2020 completed 02/23/2021 ?2. delivery of the baby ?3. ?Continue neoadjuvant therapy with Taxol Herceptin and Perjeta ?4. ?Mastectomy with ALND versus breast conserving surgery if she has a dramatic response ?5. ?Adjuvant radiation therapy ?6. ?Adjuvant antiestrogen therapy with ovarian function suppression ?Genetic testing ?---------------------------------------------------------------------------------------------------------------------------------- ?Ultrasound breast at [facility]: 1.6 cm mass at 12:00 (previously 1.7, 1 mass disappeared, mass 1:30 position: 1.3 cm (was previously 1 cm), another mass 1 cm (previously 0.6 cm), mass at 11:00: 1.1 cm (previously 1.1 cm) right axillary lymph node stable 1 lymph node 2.9 cm (was 3.1 cm) ? ?Evelyn Marshall has delivered a healthy boy who is in the NICU working on his weight gain but expected to be discharged in the coming weeks. ? ?She is going to undergo several staging tests since we were unable to do these when she was expecting.  Today she underwent an echocardiogram with the results pending, along with a PET scan that will occur this afternoo  Tomorrow she will start treatment with Taxol, Herceptin, Perjeta, and this weekend she will undergo bilateral breast MRI. ? ?We discussed her chemotherapy, side effects, risks and benefits.  She understands everything and verbalized  agreement to proceed with her treatment.  We discussed her birth control and she and her partner should use condoms as contraception.  She and I discussed her hypertension as it is uncontrolled on her current medication regimen.  I placed a referral to cardiology, the hypertension clinic. ? ?I reviewed return tomorrow for chemotherapy and next week for labs, follow-up with Dr. Lindi Marshall, Taxol. ?

## 2021-03-24 NOTE — Progress Notes (Signed)
?  Echocardiogram ?2D Echocardiogram has been performed. ? ?Evelyn Marshall ?03/24/2021, 10:26 AM ?

## 2021-03-25 ENCOUNTER — Encounter: Payer: Self-pay | Admitting: *Deleted

## 2021-03-25 ENCOUNTER — Encounter: Payer: Self-pay | Admitting: Hematology and Oncology

## 2021-03-25 ENCOUNTER — Inpatient Hospital Stay: Payer: BC Managed Care – PPO

## 2021-03-25 VITALS — BP 136/82 | HR 80 | Temp 98.5°F | Resp 16

## 2021-03-25 DIAGNOSIS — Z5112 Encounter for antineoplastic immunotherapy: Secondary | ICD-10-CM | POA: Diagnosis not present

## 2021-03-25 DIAGNOSIS — Z170 Estrogen receptor positive status [ER+]: Secondary | ICD-10-CM

## 2021-03-25 MED ORDER — DIPHENHYDRAMINE HCL 50 MG/ML IJ SOLN
25.0000 mg | Freq: Once | INTRAMUSCULAR | Status: AC
  Administered 2021-03-25: 25 mg via INTRAVENOUS
  Filled 2021-03-25: qty 1

## 2021-03-25 MED ORDER — SODIUM CHLORIDE 0.9 % IV SOLN
420.0000 mg | Freq: Once | INTRAVENOUS | Status: AC
  Administered 2021-03-25: 420 mg via INTRAVENOUS
  Filled 2021-03-25: qty 14

## 2021-03-25 MED ORDER — TRASTUZUMAB-DKST CHEMO 150 MG IV SOLR
8.0000 mg/kg | Freq: Once | INTRAVENOUS | Status: AC
  Administered 2021-03-25: 1071 mg via INTRAVENOUS
  Filled 2021-03-25: qty 51

## 2021-03-25 MED ORDER — FAMOTIDINE IN NACL 20-0.9 MG/50ML-% IV SOLN
20.0000 mg | Freq: Once | INTRAVENOUS | Status: AC
  Administered 2021-03-25: 20 mg via INTRAVENOUS
  Filled 2021-03-25: qty 50

## 2021-03-25 MED ORDER — ACETAMINOPHEN 325 MG PO TABS
650.0000 mg | ORAL_TABLET | Freq: Once | ORAL | Status: AC
  Administered 2021-03-25: 650 mg via ORAL
  Filled 2021-03-25: qty 2

## 2021-03-25 MED ORDER — HEPARIN SOD (PORK) LOCK FLUSH 100 UNIT/ML IV SOLN
500.0000 [IU] | Freq: Once | INTRAVENOUS | Status: AC | PRN
  Administered 2021-03-25: 500 [IU]

## 2021-03-25 MED ORDER — SODIUM CHLORIDE 0.9 % IV SOLN
80.0000 mg/m2 | Freq: Once | INTRAVENOUS | Status: AC
  Administered 2021-03-25: 198 mg via INTRAVENOUS
  Filled 2021-03-25: qty 33

## 2021-03-25 MED ORDER — SODIUM CHLORIDE 0.9 % IV SOLN
10.0000 mg | Freq: Once | INTRAVENOUS | Status: AC
  Administered 2021-03-25: 10 mg via INTRAVENOUS
  Filled 2021-03-25: qty 10

## 2021-03-25 MED ORDER — SODIUM CHLORIDE 0.9 % IV SOLN: Freq: Once | INTRAVENOUS | Status: AC

## 2021-03-25 MED ORDER — SODIUM CHLORIDE 0.9% FLUSH
10.0000 mL | INTRAVENOUS | Status: DC | PRN
  Administered 2021-03-25: 10 mL

## 2021-03-25 NOTE — Patient Instructions (Addendum)
[facility]  Discharge Instructions: Thank you for choosing [facility] to provide your oncology and hematology care.   If you have a lab appointment with the [facility], please go directly to the [facility] and check in at the registration area.   Wear comfortable clothing and clothing appropriate for easy access to any Portacath or PICC line.   We strive to give you quality time with your provider. You may need to reschedule your appointment if you arrive late (15 or more minutes).  Arriving late affects you and other patients whose appointments are after yours.  Also, if you miss three or more appointments without notifying the office, you may be dismissed from the clinic at the providers discretion.      For prescription refill requests, have your pharmacy contact our office and allow 72 hours for refills to be completed.    Today you received the following chemotherapy and/or immunotherapy agents: Taxol (paclitaxel), Perjecta (pertuzamab), Ogivri (trastuzamab)      To help prevent nausea and vomiting after your treatment, we encourage you to take your nausea medication as directed.  BELOW ARE SYMPTOMS THAT SHOULD BE REPORTED IMMEDIATELY: *FEVER GREATER THAN 100.4 F (38 C) OR HIGHER *CHILLS OR SWEATING *NAUSEA AND VOMITING THAT IS NOT CONTROLLED WITH YOUR NAUSEA MEDICATION *UNUSUAL SHORTNESS OF BREATH *UNUSUAL BRUISING OR BLEEDING *URINARY PROBLEMS (pain or burning when urinating, or frequent urination) *BOWEL PROBLEMS (unusual diarrhea, constipation, pain near the anus) TENDERNESS IN MOUTH AND THROAT WITH OR WITHOUT PRESENCE OF ULCERS (sore throat, sores in mouth, or a toothache) UNUSUAL RASH, SWELLING OR PAIN  UNUSUAL VAGINAL DISCHARGE OR ITCHING   Items with * indicate a potential emergency and should be followed up as soon as possible or go to the Emergency Department if any problems should occur.  Please show the CHEMOTHERAPY  ALERT CARD or IMMUNOTHERAPY ALERT CARD at check-in to the Emergency Department and triage nurse.  Should you have questions after your visit or need to cancel or reschedule your appointment, please contact [facility]  Dept: [phone]  and follow the prompts.  Office hours are 8:00 a.m. to 4:30 p.m. Monday - Friday. Please note that voicemails left after 4:00 p.m. may not be returned until the following business day.  We are closed weekends and major holidays. You have access to a nurse at all times for urgent questions. Please call the main number to the clinic Dept: [phone] and follow the prompts.   For any non-urgent questions, you may also contact your provider using MyChart. We now offer e-Visits for anyone 31 and older to request care online for non-urgent symptoms. For details visit [email or web address].   Also download the MyChart app! Go to the app store, search "MyChart", open the app, select [facility], and log in with your MyChart username and password.  Due to Covid, a mask is required upon entering the hospital/clinic. If you do not have a mask, one will be given to you upon arrival. For doctor visits, patients may have 1 support person aged 67 or older with them. For treatment visits, patients cannot have anyone with them due to current Covid guidelines and our immunocompromised population.   Paclitaxel injection What is this medication? PACLITAXEL (PAK li TAX el) is a chemotherapy drug. It targets fast dividing cells, like cancer cells, and causes these cells to die. This medicine is used to treat ovarian cancer, breast cancer, lung cancer, Kaposi's sarcoma, and  other cancers. This medicine may be used for other purposes; ask your health care provider or pharmacist if you have questions. COMMON BRAND NAME(S): Onxol, Taxol What should I tell my care team before I take this medication? They need to know if you have any of these  conditions: history of irregular heartbeat liver disease low blood counts, like low white cell, platelet, or red cell counts lung or breathing disease, like asthma tingling of the fingers or toes, or other nerve disorder an unusual or allergic reaction to paclitaxel, alcohol, polyoxyethylated castor oil, other chemotherapy, other medicines, foods, dyes, or preservatives pregnant or trying to get pregnant breast-feeding How should I use this medication? This drug is given as an infusion into a vein. It is administered in a hospital or clinic by a specially trained health care professional. Talk to your pediatrician regarding the use of this medicine in children. Special care may be needed. Overdosage: If you think you have taken too much of this medicine contact a poison control center or emergency room at once. NOTE: This medicine is only for you. Do not share this medicine with others. What if I miss a dose? It is important not to miss your dose. Call your doctor or health care professional if you are unable to keep an appointment. What may interact with this medication? Do not take this medicine with any of the following medications: live virus vaccines This medicine may also interact with the following medications: antiviral medicines for hepatitis, HIV or AIDS certain antibiotics like erythromycin and clarithromycin certain medicines for fungal infections like ketoconazole and itraconazole certain medicines for seizures like carbamazepine, phenobarbital, phenytoin gemfibrozil nefazodone rifampin St. John's wort This list may not describe all possible interactions. Give your health care provider a list of all the medicines, herbs, non-prescription drugs, or dietary supplements you use. Also tell them if you smoke, drink alcohol, or use illegal drugs. Some items may interact with your medicine. What should I watch for while using this medication? Your condition will be monitored  carefully while you are receiving this medicine. You will need important blood work done while you are taking this medicine. This medicine can cause serious allergic reactions. To reduce your risk you will need to take other medicine(s) before treatment with this medicine. If you experience allergic reactions like skin rash, itching or hives, swelling of the face, lips, or tongue, tell your doctor or health care professional right away. In some cases, you may be given additional medicines to help with side effects. Follow all directions for their use. This drug may make you feel generally unwell. This is not uncommon, as chemotherapy can affect healthy cells as well as cancer cells. Report any side effects. Continue your course of treatment even though you feel ill unless your doctor tells you to stop. Call your doctor or health care professional for advice if you get a fever, chills or sore throat, or other symptoms of a cold or flu. Do not treat yourself. This drug decreases your body's ability to fight infections. Try to avoid being around people who are sick. This medicine may increase your risk to bruise or bleed. Call your doctor or health care professional if you notice any unusual bleeding. Be careful brushing and flossing your teeth or using a toothpick because you may get an infection or bleed more easily. If you have any dental work done, tell your dentist you are receiving this medicine. Avoid taking products that contain aspirin, acetaminophen, ibuprofen, naproxen, or  ketoprofen unless instructed by your doctor. These medicines may hide a fever. Do not become pregnant while taking this medicine. Women should inform their doctor if they wish to become pregnant or think they might be pregnant. There is a potential for serious side effects to an unborn child. Talk to your health care professional or pharmacist for more information. Do not breast-feed an infant while taking this medicine. Men are  advised not to father a child while receiving this medicine. This product may contain alcohol. Ask your pharmacist or healthcare provider if this medicine contains alcohol. Be sure to tell all healthcare providers you are taking this medicine. Certain medicines, like metronidazole and disulfiram, can cause an unpleasant reaction when taken with alcohol. The reaction includes flushing, headache, nausea, vomiting, sweating, and increased thirst. The reaction can last from 30 minutes to several hours. What side effects may I notice from receiving this medication? Side effects that you should report to your doctor or health care professional as soon as possible: allergic reactions like skin rash, itching or hives, swelling of the face, lips, or tongue breathing problems changes in vision fast, irregular heartbeat high or low blood pressure mouth sores pain, tingling, numbness in the hands or feet signs of decreased platelets or bleeding - bruising, pinpoint red spots on the skin, [place], tarry stools, blood in the urine signs of decreased red blood cells - unusually weak or tired, feeling faint or lightheaded, falls signs of infection - fever or chills, cough, sore throat, pain or difficulty passing urine signs and symptoms of liver injury like dark yellow or brown urine; general ill feeling or flu-like symptoms; light-colored stools; loss of appetite; nausea; right upper belly pain; unusually weak or tired; yellowing of the eyes or skin swelling of the ankles, feet, hands unusually slow heartbeat Side effects that usually do not require medical attention (report to your doctor or health care professional if they continue or are bothersome): diarrhea hair loss loss of appetite muscle or joint pain nausea, vomiting pain, redness, or irritation at site where injected tiredness This list may not describe all possible side effects. Call your doctor for medical advice about side effects. You may  report side effects to FDA at 1-800-FDA-1088. Where should I keep my medication? This drug is given in a hospital or clinic and will not be stored at home. NOTE: This sheet is a summary. It may not cover all possible information. If you have questions about this medicine, talk to your doctor, pharmacist, or health care provider.  2022 Elsevier/Gold Standard (2020-09-28 00:00:00)  Pertuzumab injection What is this medication? PERTUZUMAB (per TOOZ ue mab) is a monoclonal antibody. It is used to treat breast cancer. This medicine may be used for other purposes; ask your health care provider or pharmacist if you have questions. COMMON BRAND NAME(S): PERJETA What should I tell my care team before I take this medication? They need to know if you have any of these conditions: heart disease heart failure high blood pressure history of irregular heart beat recent or ongoing radiation therapy an unusual or allergic reaction to pertuzumab, other medicines, foods, dyes, or preservatives pregnant or trying to get pregnant breast-feeding How should I use this medication? This medicine is for infusion into a vein. It is given by a health care professional in a hospital or clinic setting. Talk to your pediatrician regarding the use of this medicine in children. Special care may be needed. Overdosage: If you think you have taken too much of  this medicine contact a poison control center or emergency room at once. NOTE: This medicine is only for you. Do not share this medicine with others. What if I miss a dose? It is important not to miss your dose. Call your doctor or health care professional if you are unable to keep an appointment. What may interact with this medication? Interactions are not expected. Give your health care provider a list of all the medicines, herbs, non-prescription drugs, or dietary supplements you use. Also tell them if you smoke, drink alcohol, or use illegal drugs. Some items  may interact with your medicine. This list may not describe all possible interactions. Give your health care provider a list of all the medicines, herbs, non-prescription drugs, or dietary supplements you use. Also tell them if you smoke, drink alcohol, or use illegal drugs. Some items may interact with your medicine. What should I watch for while using this medication? Your condition will be monitored carefully while you are receiving this medicine. Report any side effects. Continue your course of treatment even though you feel ill unless your doctor tells you to stop. Do not become pregnant while taking this medicine or for 7 months after stopping it. Women should inform their doctor if they wish to become pregnant or think they might be pregnant. Women of child-bearing potential will need to have a negative pregnancy test before starting this medicine. There is a potential for serious side effects to an unborn child. Talk to your health care professional or pharmacist for more information. Do not breast-feed an infant while taking this medicine or for 7 months after stopping it. Women must use effective birth control with this medicine. Call your doctor or health care professional for advice if you get a fever, chills or sore throat, or other symptoms of a cold or flu. Do not treat yourself. Try to avoid being around people who are sick. You may experience fever, chills, and headache during the infusion. Report any side effects during the infusion to your health care professional. What side effects may I notice from receiving this medication? Side effects that you should report to your doctor or health care professional as soon as possible: breathing problems chest pain or palpitations dizziness feeling faint or lightheaded fever or chills skin rash, itching or hives sore throat swelling of the face, lips, or tongue swelling of the legs or ankles unusually weak or tired Side effects that  usually do not require medical attention (report to your doctor or health care professional if they continue or are bothersome): diarrhea hair loss nausea, vomiting tiredness This list may not describe all possible side effects. Call your doctor for medical advice about side effects. You may report side effects to FDA at 1-800-FDA-1088. Where should I keep my medication? This drug is given in a hospital or clinic and will not be stored at home. NOTE: This sheet is a summary. It may not cover all possible information. If you have questions about this medicine, talk to your doctor, pharmacist, or health care provider.  2022 Elsevier/Gold Standard (2015-02-11 00:00:00)  Trastuzumab injection for infusion What is this medication? TRASTUZUMAB (tras TOO zoo mab) is a monoclonal antibody. It is used to treat breast cancer and stomach cancer. This medicine may be used for other purposes; ask your health care provider or pharmacist if you have questions. COMMON BRAND NAME(S): Herceptin, Janae Bridgeman, Ontruzant, Trazimera What should I tell my care team before I take this medication? They need to know  if you have any of these conditions: heart disease heart failure lung or breathing disease, like asthma an unusual or allergic reaction to trastuzumab, benzyl alcohol, or other medications, foods, dyes, or preservatives pregnant or trying to get pregnant breast-feeding How should I use this medication? This drug is given as an infusion into a vein. It is administered in a hospital or clinic by a specially trained health care professional. Talk to your pediatrician regarding the use of this medicine in children. This medicine is not approved for use in children. Overdosage: If you think you have taken too much of this medicine contact a poison control center or emergency room at once. NOTE: This medicine is only for you. Do not share this medicine with others. What if I miss a dose? It  is important not to miss a dose. Call your doctor or health care professional if you are unable to keep an appointment. What may interact with this medication? This medicine may interact with the following medications: certain types of chemotherapy, such as daunorubicin, doxorubicin, epirubicin, and idarubicin This list may not describe all possible interactions. Give your health care provider a list of all the medicines, herbs, non-prescription drugs, or dietary supplements you use. Also tell them if you smoke, drink alcohol, or use illegal drugs. Some items may interact with your medicine. What should I watch for while using this medication? Visit your doctor for checks on your progress. Report any side effects. Continue your course of treatment even though you feel ill unless your doctor tells you to stop. Call your doctor or health care professional for advice if you get a fever, chills or sore throat, or other symptoms of a cold or flu. Do not treat yourself. Try to avoid being around people who are sick. You may experience fever, chills and shaking during your first infusion. These effects are usually mild and can be treated with other medicines. Report any side effects during the infusion to your health care professional. Fever and chills usually do not happen with later infusions. Do not become pregnant while taking this medicine or for 7 months after stopping it. Women should inform their doctor if they wish to become pregnant or think they might be pregnant. Women of child-bearing potential will need to have a negative pregnancy test before starting this medicine. There is a potential for serious side effects to an unborn child. Talk to your health care professional or pharmacist for more information. Do not breast-feed an infant while taking this medicine or for 7 months after stopping it. Women must use effective birth control with this medicine. What side effects may I notice from receiving  this medication? Side effects that you should report to your doctor or health care professional as soon as possible: allergic reactions like skin rash, itching or hives, swelling of the face, lips, or tongue chest pain or palpitations cough dizziness feeling faint or lightheaded, falls fever general ill feeling or flu-like symptoms signs of worsening heart failure like breathing problems; swelling in your legs and feet unusually weak or tired Side effects that usually do not require medical attention (report to your doctor or health care professional if they continue or are bothersome): bone pain changes in taste diarrhea joint pain nausea/vomiting weight loss This list may not describe all possible side effects. Call your doctor for medical advice about side effects. You may report side effects to FDA at 1-800-FDA-1088. Where should I keep my medication? This drug is given in a hospital  or clinic and will not be stored at home. NOTE: This sheet is a summary. It may not cover all possible information. If you have questions about this medicine, talk to your doctor, pharmacist, or health care provider.  2022 Elsevier/Gold Standard (2016-01-25 00:00:00)

## 2021-03-26 ENCOUNTER — Ambulatory Visit (HOSPITAL_COMMUNITY): Admission: RE | Admit: 2021-03-26 | Payer: BC Managed Care – PPO | Source: Ambulatory Visit

## 2021-03-26 ENCOUNTER — Telehealth (HOSPITAL_COMMUNITY): Payer: Self-pay | Admitting: *Deleted

## 2021-03-26 ENCOUNTER — Encounter (HOSPITAL_COMMUNITY): Payer: Self-pay

## 2021-03-26 NOTE — Telephone Encounter (Signed)
Patient voiced no questions or concerns at this time. EPDS=1. Infant remains in NICU. Patient requested RN email information on hospital's virtual postpartum classes and support groups. Email sent. Erline Levine, RN,  03/26/21, 1017 ?

## 2021-03-27 ENCOUNTER — Ambulatory Visit: Payer: Self-pay

## 2021-03-27 NOTE — Lactation Note (Signed)
This note was copied from a baby's chart. ?Lactation Consultation Note ? ?Patient Name: Evelyn Marshall ?Today's Date: 03/27/2021 ?Reason for consult: Follow-up assessment;NICU baby;Late-preterm 34-36.6wks ?Age:34 days ? ?Visited with mom of 74 41/20 weeks old (adjusted) NICU female, she's a P4 and reports that she didn't experience the onset of lactogenesis II; today is day 11 post-partum she voiced that on her last breast MRI the radiologist didn't noticed any signs of breastmilk. ? ?Mom denies any pain/discomfort related to lactation, she's experiencing fatigue and some body aches but this is related to her cancer treatments. Baby is currently on Similac 24 calorie formula and mom reports he's doing really well with the bottle, praised her for her efforts. [facility] services and completed at this time, but mom is aware she can call anytime if she has questions/concerns. ? ?Feeding ?Mother's Current Feeding Choice: Formula ?Nipple Type: Nfant Slow Flow (purple) ? ?Interventions ?Interventions: Education ? ?Consult Status ?Consult Status: Complete ?Date: 03/27/21 ?Follow-up type: Call as needed ? ? ?[place] ?03/27/2021, 6:30 PM ? ? ? ?

## 2021-03-28 ENCOUNTER — Telehealth: Payer: Self-pay | Admitting: *Deleted

## 2021-03-28 ENCOUNTER — Other Ambulatory Visit: Payer: Self-pay | Admitting: *Deleted

## 2021-03-28 MED ORDER — ONDANSETRON HCL 8 MG PO TABS: 8.0000 mg | ORAL_TABLET | Freq: Three times a day (TID) | ORAL | 0 refills | Status: DC | PRN

## 2021-03-28 MED ORDER — LIDOCAINE-PRILOCAINE 2.5-2.5 % EX CREA: 1.0000 "application " | TOPICAL_CREAM | CUTANEOUS | 0 refills | Status: DC | PRN

## 2021-03-28 MED ORDER — PROCHLORPERAZINE MALEATE 10 MG PO TABS: 10.0000 mg | ORAL_TABLET | Freq: Four times a day (QID) | ORAL | 0 refills | Status: DC | PRN

## 2021-03-28 NOTE — Telephone Encounter (Signed)
Called pt to see how she did with her recent treatment.  She reports some nausea & took zofran which helps.  She also reports some fatigue but otherwise no major complaints. She states a couple of diarrhea stools that cleared without taking anything.  She knows how to reach [place] & knows her next appt.   ?

## 2021-03-28 NOTE — Telephone Encounter (Signed)
-----   Message from Dionne Ano, RN sent at 03/25/2021  2:47 PM EST ----- ?Regarding: Dr. Lindi Adie 1st time call back Taxol/Perjeta/Trastuzamab 3/6 ? ? ?

## 2021-03-29 ENCOUNTER — Ambulatory Visit (INDEPENDENT_AMBULATORY_CARE_PROVIDER_SITE_OTHER): Payer: BC Managed Care – PPO | Admitting: Internal Medicine

## 2021-03-29 ENCOUNTER — Encounter: Payer: Self-pay | Admitting: Internal Medicine

## 2021-03-29 ENCOUNTER — Other Ambulatory Visit: Payer: Self-pay | Admitting: *Deleted

## 2021-03-29 ENCOUNTER — Other Ambulatory Visit: Payer: Self-pay

## 2021-03-29 VITALS — BP 106/70 | HR 82 | Ht 64.0 in | Wt 280.8 lb

## 2021-03-29 DIAGNOSIS — Z79899 Other long term (current) drug therapy: Secondary | ICD-10-CM

## 2021-03-29 DIAGNOSIS — Z5181 Encounter for therapeutic drug level monitoring: Secondary | ICD-10-CM

## 2021-03-29 MED ORDER — LIDOCAINE-PRILOCAINE 2.5-2.5 % EX CREA: 1.0000 "application " | TOPICAL_CREAM | CUTANEOUS | 0 refills | Status: DC | PRN

## 2021-03-29 NOTE — Patient Instructions (Signed)
Medication Instructions:  ?No Changes In Medications at this time.  ?*If you need a refill on your cardiac medications before your next appointment, please call your pharmacy* ? ?Lab Work: ?BNP- TODAY  ?If you have labs (blood work) drawn today and your tests are completely normal, you will receive your results only by: ?MyChart Message (if you have MyChart) OR ?A paper copy in the mail ?If you have any lab test that is abnormal or we need to change your treatment, we will call you to review the results. ? ?Testing/Procedures: ?Your physician has requested that you have an echocardiogram IN June 2023. Echocardiography is a painless test that uses sound waves to create images of your heart. It provides your doctor with information about the size and shape of your heart and how well your heart?s chambers and valves are working. You may receive an ultrasound enhancing agent through an IV if needed to better visualize your heart during the echo.This procedure takes approximately one hour. There are no restrictions for this procedure. This will take place at the [address]. ' ? ?Follow-Up: ?At [facility], you and your health needs are our priority.  As part of our continuing mission to provide you with exceptional heart care, we have created designated Provider Care Teams.  These Care Teams include your primary Cardiologist (physician) and Advanced Practice Providers (APPs -  Physician Assistants and Nurse Practitioners) who all work together to provide you with the care you need, when you need it. ? ?Your next appointment:   ?IN June- RIGHT AFTER ECHO ? ?The format for your next appointment:   ?In Person ? ?Provider:   ?Janina Mayo, MD   ? ?

## 2021-03-29 NOTE — Progress Notes (Signed)
Cardiology Office Note:    Date:  03/29/2021   ID:  Evelyn Marshall, DOB 06-Jul-1985, MRN [id number]  PCP:  [name], MD   [facility] Cardiologist:  Janina Mayo, MD     Referring MD: Wilber Bihari Cornett*   No chief complaint on file. Cardiotoxic Breast Cancer therapy  History of Present Illness:    Evelyn Marshall is a 35 y.o. female with a hx of Stage IV right breast cancer, HTN, hx preeclampsia referral for cardiotoxicity surveillance while on chemotherapy  She recently delivered a baby boy 03/15/2021 with induction of labor at 41 weeks. She had hypertension during pregnancy and hx of preeclampsia. She was managed with magnesium sulfate and labetalol.  She checks her blood pressures at home. Most recent was 118/70 mmHg. She denies CP, shortness of breath. She denies PND, orthopnea, and no LE edema. She did a lot of walking after the delivery.  She has hx of Stage IV ER+ , HER2+ breast cancer RUQ.  AC Q21 days/Trastuzumab + Pertuzumab+ Weekly Paclitaxel. She was diagnosed November, 16th 2022. She was pregnant. Started with AC. She started anti-her2 recently post delivery day 1 cycle 1 03/25/2021. She is planned for adjuvant radiation on the right and adjuvant antiestrogen therapy with ovarian function suppression.  Family Hx: Father had hx of congestive HF and on LVAD HM III. Still smoking so not on transplant list. She notes hx of hypertension. Sibling with diabetes.   Social Hx: She has four boys. She is on medical leave. She's a Pharmacist, hospital, teaching special ed. No smoking hx. No etoh use  Past Medical History:  Diagnosis Date   Abnormal Pap smear 2012   colpo result benign   Cancer ([place])    Chlamydia    Family history of breast cancer 12/15/2020   History of pre-eclampsia    HPV (human papilloma virus) infection    Hyperemesis    Hypokalemia    Pregnancy induced hypertension    Ptyalism    Vaginal Pap smear, abnormal     Past Surgical History:  Procedure  Laterality Date   COLPOSCOPY     IR IMAGING GUIDED PORT INSERTION  12/24/2020   WISDOM TOOTH EXTRACTION      Current Medications: Current Meds  Medication Sig   ibuprofen (ADVIL) 600 MG tablet Take 1 tablet (600 mg total) by mouth every 6 (six) hours as needed for mild pain or moderate pain.   Iron-FA-B Cmp-C-Biot-Probiotic (FUSION PLUS) CAPS Take 1 capsule by mouth daily.   labetalol (NORMODYNE) 200 MG tablet Take 1 tablet (200 mg total) by mouth 2 (two) times daily.   lidocaine-prilocaine (EMLA) cream Apply 1 application. topically as needed.   NIFEdipine (ADALAT CC) 30 MG 24 hr tablet Take 1 tablet (30 mg total) by mouth daily.   ondansetron (ZOFRAN) 8 MG tablet Take 1 tablet (8 mg total) by mouth every 8 (eight) hours as needed for nausea or vomiting.   prochlorperazine (COMPAZINE) 10 MG tablet Take 1 tablet (10 mg total) by mouth every 6 (six) hours as needed for nausea or vomiting.     Allergies:   Patient has no known allergies.   Social History   Socioeconomic History   Marital status: Married    Spouse name: Belenda Cruise   Number of children: 3   Years of education: 16   Highest education level: Bachelor's degree (e.g., BA, AB, BS)  Occupational History   Occupation: teacher  Tobacco Use   Smoking status: Never  Smokeless tobacco: Never  Vaping Use   Vaping Use: Never used  Substance and Sexual Activity   Alcohol use: No   Drug use: No   Sexual activity: Yes  Other Topics Concern   Not on file  Social History Narrative   Not on file   Social Determinants of Health   Financial Resource Strain: Not on file  Food Insecurity: Not on file  Transportation Needs: Not on file  Physical Activity: Not on file  Stress: Not on file  Social Connections: Not on file     Family History: The patient's family history includes Breast cancer in her paternal grandmother; Breast cancer (age of onset: 90) in her cousin; Heart disease in her father; Hypertension in her father;  Stroke in her father and paternal grandmother.  ROS:   Please see the history of present illness.      All other systems reviewed and are negative.  EKGs/Labs/Other Studies Reviewed:    The following studies were reviewed today:   EKG:  EKG is  ordered today.  The ekg ordered today demonstrates   NSR, Qtc 446m   Recent Labs: 03/15/2021: Magnesium 3.6 03/24/2021: ALT 43; BUN 18; Creatinine 0.63; Hemoglobin 10.6; Platelet Count 368; Potassium 4.3; Sodium 136  Recent Lipid Panel No results found for: CHOL, TRIG, HDL, CHOLHDL, VLDL, LDLCALC, LDLDIRECT   Risk Assessment/Calculations:           Physical Exam:    VS:    Vitals:   03/29/21 0858  BP: 106/70  Pulse: 82  SpO2: 98%     Wt Readings from Last 3 Encounters:  03/29/21 280 lb 12.8 oz (127.4 kg)  03/24/21 278 lb 14.4 oz (126.5 kg)  03/15/21 294 lb 3.2 oz (133.4 kg)     GEN:  Well nourished, well developed in no acute distress HEENT: Normal NECK: No JVD; No carotid bruits LYMPHATICS: No lymphadenopathy CARDIAC: RRR, no murmurs, rubs, gallops RESPIRATORY:  Clear to auscultation without rales, wheezing or rhonchi  ABDOMEN:  non-distended MUSCULOSKELETAL:  No edema; No deformity  SKIN: Warm and dry NEUROLOGIC:  Alert and oriented x 3 PSYCHIATRIC:  Normal affect   ASSESSMENT:    #HTN:  Well controlled today and at home. She can continue current regimen since her blood pressures are in good control.  - cont. labetalol - continue nifedipine 30 mg daily - can plan to start losartan 25 mg, if blood pressures are not controlled during therapy; with plan to uptitrate for goal BP < 130/80 mmHg  #Cardio Onc HER2+ BC; [facility] Chemo AC initiation : 12/24/2020- 02/23/2021 Cumulative Dose: 220 mg/m2 Anti-HER2 Initiation: 03/25/2021 Surveillance:  - baseline echo- normal, mild LVH - will get follow up BNP - plan follow up echo in 3 months 06/2021 limited TTE with strain  PLAN:    In  order of problems listed above:  BNP today Limited TTE with strain + BNP on next visit Follow up 3 months         Medication Adjustments/Labs and Tests Ordered: Current medicines are reviewed at length with the patient today.  Concerns regarding medicines are outlined above.  Orders Placed This Encounter  Procedures   Brain natriuretic peptide   EKG 12-Lead   ECHOCARDIOGRAM LIMITED   No orders of the defined types were placed in this encounter.   Patient Instructions  Medication Instructions:  No Changes In Medications at this time.  *If you need a refill on your cardiac medications before your  next appointment, please call your pharmacy*  Lab Work: BNP- TODAY  If you have labs (blood work) drawn today and your tests are completely normal, you will receive your results only by: [place] (if you have MyChart) OR A paper copy in the mail If you have any lab test that is abnormal or we need to change your treatment, we will call you to review the results.  Testing/Procedures: Your physician has requested that you have an echocardiogram IN June 2023. Echocardiography is a painless test that uses sound waves to create images of your heart. It provides your doctor with information about the size and shape of your heart and how well your hearts chambers and valves are working. You may receive an ultrasound enhancing agent through an IV if needed to better visualize your heart during the echo.This procedure takes approximately one hour. There are no restrictions for this procedure. This will take place at the [address]. '  Follow-Up: At [facility], you and your health needs are our priority.  As part of our continuing mission to provide you with exceptional heart care, we have created designated Provider Care Teams.  These Care Teams include your primary Cardiologist (physician) and Advanced Practice Providers (APPs -  Physician Assistants and Nurse  Practitioners) who all work together to provide you with the care you need, when you need it.  Your next appointment:   IN June- RIGHT AFTER ECHO  The format for your next appointment:   In Person  Provider:   Janina Mayo, MD      Signed, Janina Mayo, MD  03/29/2021 9:41 AM    [place]

## 2021-03-30 ENCOUNTER — Encounter: Payer: Self-pay | Admitting: *Deleted

## 2021-03-30 ENCOUNTER — Telehealth: Payer: Self-pay | Admitting: *Deleted

## 2021-03-30 LAB — BRAIN NATRIURETIC PEPTIDE: BNP: 8.2 pg/mL (ref 0.0–100.0)

## 2021-03-30 MED FILL — Dexamethasone Sodium Phosphate Inj 100 MG/10ML: INTRAMUSCULAR | Qty: 1 | Status: AC

## 2021-03-30 NOTE — Progress Notes (Signed)
? ?Patient Care Team: ?[name], MD as PCP - General (Family Medicine) ?Janina Mayo, MD as PCP - Cardiology (Cardiology) ?Nicholas Lose, MD as Consulting Physician (Hematology and Oncology) ?Servando Salina, MD as Consulting Physician (Obstetrics and Gynecology) ?Irene Limbo, MD as Consulting Physician (Plastic Surgery) ?Rolm Bookbinder, MD as Consulting Physician (General Surgery) ? ?DIAGNOSIS:  ?  ICD-10-CM   ?1. Malignant neoplasm of upper-outer quadrant of right breast in female, estrogen receptor positive ([place])  C50.411   ? Z17.0   ?  ? ? ?SUMMARY OF ONCOLOGIC HISTORY: ?Oncology History  ?Malignant neoplasm of upper-outer quadrant of right breast in female, estrogen receptor positive ([place])  ?12/08/2020 Initial Diagnosis  ? 21-week pregnancy: MRI at [facility] noncontrast revealed right axillary mass 3.7 cm, subpectoral lymph nodes, left supraclavicular lymph node, multiple level 2 and 3 lymph nodes in the right axilla, 2 breast masses 1.1 cm largest size.  Mammogram in [place] revealed 3 small masses at 12:00 1.2 cm, 0.6 cm, 0.9 cm and 5 abnormal axillary lymph nodes, pleomorphic calcifications measuring 10 cm, biopsy revealed grade 2 IDC with necrosis and calcifications ER 30%, PR 0%, HER2 positive, Ki-67 30 to 40% ?  ?12/15/2020 Cancer Staging  ? Staging form: Breast, AJCC 8th Edition ?- Clinical: Stage IV (cT3, cN2, pM1, G2, ER+, PR-, HER2+) - Signed by Nicholas Lose, MD on 12/15/2020 ?Histologic grading system: 3 grade system ? ?  ?12/24/2020 - 02/23/2021 Chemotherapy  ? Patient is on Treatment Plan : BREAST Adjuvant AC q21d  ?   ?12/29/2020 Genetic Testing  ? Negative hereditary cancer genetic testing: no pathogenic variants detected in [facility] BRCAPlus Panel or [facility] CustomNext-Cancer +RNAinsight Panel.  The report dates are 12/23/2020 and 12/29/2020.  ? ?The BRCAplus panel offered by [employer] and includes sequencing and deletion/duplication analysis for the following 8 genes: ATM, BRCA1,  BRCA2, CDH1, CHEK2, PALB2, PTEN, and TP53.  The CustomNext-Cancer+RNAinsight panel offered by [facility] includes sequencing and rearrangement analysis for the following 47 genes:  APC, ATM, AXIN2, BARD1, BMPR1A, BRCA1, BRCA2, BRIP1, CDH1, CDK4, CDKN2A, CHEK2, DICER1, EPCAM, GREM1, HOXB13, MEN1, MLH1, MSH2, MSH3, MSH6, MUTYH, NBN, NF1, NF2, NTHL1, PALB2, PMS2, POLD1, POLE, PTEN, RAD51C, RAD51D, RECQL, RET, SDHA, SDHAF2, SDHB, SDHC, SDHD, SMAD4, SMARCA4, STK11, TP53, TSC1, TSC2, and VHL.  RNA data is routinely analyzed for use in variant interpretation for all genes. ?  ?03/25/2021 -  Chemotherapy  ? Patient is on Treatment Plan : BREAST AC q21 days / Trastuzumab + Pertuzumab + Weekly Paclitaxel q21d  ?   ? ? ?CHIEF COMPLIANT: Follow-up of breast cancer ? ?INTERVAL HISTORY: Evelyn Marshall is a 36 y.o. with above-mentioned history of cancer. PET on 03/24/2021 showed multifocal abnormal hypermetabolic activity in the right breast ?consistent with her known primary breast malignancy, and hypermetabolic right axillary, subpectoral, bilateral supraclavicular and mediastinal lymph nodes consistent with disease ?involvement. She presents to the clinic today for follow-up.  She tolerated cycle 1 Taxol Herceptin Perjeta fairly well.  She had 1 episode of diarrhea. ? ?ALLERGIES:  has No Known Allergies. ? ?MEDICATIONS:  ?Current Outpatient Medications  ?Medication Sig Dispense Refill  ? Doxylamine-Pyridoxine 10-10 MG TBEC Take 1 tablet by mouth 3 (three) times daily as needed (nausea). (Patient not taking: Reported on 03/29/2021)    ? ibuprofen (ADVIL) 600 MG tablet Take 1 tablet (600 mg total) by mouth every 6 (six) hours as needed for mild pain or moderate pain. 30 tablet 11  ? Iron-FA-B Cmp-C-Biot-Probiotic (FUSION PLUS) CAPS Take 1 capsule  by mouth daily.    ? labetalol (NORMODYNE) 200 MG tablet Take 1 tablet (200 mg total) by mouth 2 (two) times daily. 60 tablet 11  ? lidocaine-prilocaine (EMLA) cream Apply 1 application.  topically as needed (apply once weekly prior to port a cath needle insertion.). 30 g 0  ? NIFEdipine (ADALAT CC) 30 MG 24 hr tablet Take 1 tablet (30 mg total) by mouth daily. 30 tablet 11  ? ondansetron (ZOFRAN) 8 MG tablet Take 1 tablet (8 mg total) by mouth every 8 (eight) hours as needed for nausea or vomiting. 20 tablet 0  ? prochlorperazine (COMPAZINE) 10 MG tablet Take 1 tablet (10 mg total) by mouth every 6 (six) hours as needed for nausea or vomiting. 30 tablet 0  ? ?No current facility-administered medications for this visit.  ? ? ?PHYSICAL EXAMINATION: ?ECOG PERFORMANCE STATUS: 1 - Symptomatic but completely ambulatory ? ?Vitals:  ? 03/31/21 1027  ?BP: (!) 157/96  ?Pulse: 86  ?Resp: 18  ?Temp: (!) 97.2 ?F (36.2 ?C)  ?SpO2: 97%  ? ?Filed Weights  ? 03/31/21 1027  ?Weight: 282 lb (127.9 kg)  ? ?  ? ?LABORATORY DATA:  ?I have reviewed the data as listed ?CMP Latest Ref Rng & Units 03/24/2021 03/17/2021 03/15/2021  ?Glucose 70 - 99 mg/dL 81 87 129(H)  ?BUN 6 - 20 mg/dL 18 6 5(L)  ?Creatinine 0.44 - 1.00 mg/dL 0.63 0.57 0.79  ?Sodium 135 - 145 mmol/L 136 132(L) 136  ?Potassium 3.5 - 5.1 mmol/L 4.3 3.8 3.6  ?Chloride 98 - 111 mmol/L 103 102 107  ?CO2 22 - 32 mmol/L 25 22 17(L)  ?Calcium 8.9 - 10.3 mg/dL 9.3 7.5(L) 9.0  ?Total Protein 6.5 - 8.1 g/dL 7.2 5.8(L) 6.5  ?Total Bilirubin 0.3 - 1.2 mg/dL 0.4 0.2(L) 0.1(L)  ?Alkaline Phos 38 - 126 U/L 105 110 113  ?AST 15 - 41 U/L 38 27 27  ?ALT 0 - 44 U/L 43 22 18  ? ? ?Lab Results  ?Component Value Date  ? WBC 2.8 (L) 03/31/2021  ? HGB 10.2 (L) 03/31/2021  ? HCT 32.2 (L) 03/31/2021  ? MCV 79.3 (L) 03/31/2021  ? PLT 333 03/31/2021  ? NEUTROABS 2.0 03/31/2021  ? ? ?ASSESSMENT & PLAN:  ?Malignant neoplasm of upper-outer quadrant of right breast in female, estrogen receptor positive ([place]) ?12/08/2020:21-week pregnancy: MRI at [facility] noncontrast revealed right axillary mass 3.7 cm, subpectoral lymph nodes, left supraclavicular lymph node, multiple level 2 and 3 lymph nodes in the  right axilla, 2 breast masses 1.1 cm largest size.  Mammogram in [place] revealed 3 small masses at 12:00 1.2 cm, 0.6 cm, 0.9 cm and 5 abnormal axillary lymph nodes, pleomorphic calcifications measuring 10 cm, biopsy revealed grade 2 IDC with necrosis and calcifications ER 30%, PR 0%, HER2 positive, Ki-67 30 to 40% ?  ?Treatment plan: ?1.  Neoadjuvant chemotherapy with Adriamycin and Cytoxan every 3 weeks x4 started 12/24/2020 completed 02/23/2021 ?2. delivery of the baby ?3.  Continue neoadjuvant therapy with Taxol Herceptin and Perjeta ?4.  Mastectomy with ALND versus breast conserving surgery if she has a dramatic response ?5.  Adjuvant radiation therapy ?6.  Adjuvant antiestrogen therapy with ovarian function suppression ?Genetic testing ?---------------------------------------------------------------------------------------------------------------------------------- ?[facility]trasound breast at [facility]: 1.6 cm mass at 12:00 (previously 1.7, 1 mass disappeared, mass 1:30 position: 1.3 cm (was previously 1 cm), another mass 1 cm (previously 0.6 cm), mass at 11:00: 1.1 cm (previously 1.1 cm) right axillary lymph node stable 1 lymph node 2.9 cm (  was 3.1 cm) ?  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  She had 1 episode of diarrhea. ?Leukopenia: ANC 2.0 (we will reduce the dosage of Taxol to 50 mg per metered squared) ? ?Return to clinic weekly for chemo and every other week for follow-up with [place]. ? ? ?No orders of the defined types were placed in this encounter. ? ?The patient has a good understanding of the overall plan. she agrees with it. she will call with any problems that may develop before the next visit here. ? ?Total time spent: 30 mins including face to face time and time spent for planning, charting and coordination of care ? ?Rulon Eisenmenger, MD, MPH ?03/31/2021 ? ?I, Thana Ates, am  acting as scribe for Dr. Nicholas Lose. ? ?I have reviewed the above documentation for accuracy and completeness, and I agree with the above. ? ? ? ? ? ? ?

## 2021-03-30 NOTE — Telephone Encounter (Addendum)
Retrieved completed [employer] forms 701, 703 and 7A.   ?Patient notified of need to sign the employee sections. ?"I have an appointment tomorrow.  Leave envelope for pick up.  I will sign and return them to [employer].  These are due by the 10th of every month.  When is the best time to bring form in to ensure provider signs form any day from the first through the tenth of the month? ? ?Advised to bring April form in between March 21st through 27th for 7-10 business (14 calendar) processing time. ? ?Currently no further questions or needs.  Envelope to area behind registration area one for patient pick-up.   ?Copy to "Record Release" bin in front office area before Managed Care for [place].I.M. staff to forward to (SW) H.I.M. to process records requested by [place] Dept, [employer].Marland Kitchen  ?

## 2021-03-30 NOTE — Assessment & Plan Note (Signed)
12/08/2020:21-week pregnancy: MRI at [facility] noncontrast revealed right axillary mass 3.7 cm, subpectoral lymph nodes, left supraclavicular lymph node, multiple level 2 and 3 lymph nodes in the right axilla, 2 breast masses 1.1 cm largest size. ?Mammogram in [place] revealed 3 small masses at 12:00 1.2 cm, 0.6 cm, 0.9 cm and 5 abnormal axillary lymph nodes, pleomorphic calcifications measuring 10 cm, biopsy revealed grade 2 IDC with necrosis and calcifications ER 30%, PR 0%, HER2 positive, Ki-67 30 to 40% ?? ?Treatment plan: ?1. ?Neoadjuvant chemotherapy with Adriamycin and Cytoxan every 3 weeks x4?started 12/24/2020 completed 02/23/2021 ?2. delivery of the baby ?3. ?Continue neoadjuvant therapy with Taxol Herceptin and Perjeta ?4. ?Mastectomy with ALND versus breast conserving surgery if she has a dramatic response ?5. ?Adjuvant radiation therapy ?6. ?Adjuvant antiestrogen therapy with ovarian function suppression ?Genetic testing ?---------------------------------------------------------------------------------------------------------------------------------- ?Ultrasound breast at [facility]: 1.6 cm mass at 12:00 (previously 1.7, 1 mass disappeared, mass 1:30 position: 1.3 cm (was previously 1 cm), another mass 1 cm (previously 0.6 cm), mass at 11:00: 1.1 cm (previously 1.1 cm) right axillary lymph node stable 1 lymph node 2.9 cm (was 3.1 cm) ?? ?Evelyn Marshall has delivered a healthy boy who is in the NICU working on his weight gain but expected to be discharged in the coming weeks. ?? ?

## 2021-03-30 NOTE — Progress Notes (Signed)
[place] forms completed for March 2023 and brought to Dr. Lindi Adie to sign. ? ? ?

## 2021-03-31 ENCOUNTER — Inpatient Hospital Stay: Payer: BC Managed Care – PPO

## 2021-03-31 ENCOUNTER — Inpatient Hospital Stay (HOSPITAL_BASED_OUTPATIENT_CLINIC_OR_DEPARTMENT_OTHER): Payer: BC Managed Care – PPO | Admitting: Hematology and Oncology

## 2021-03-31 ENCOUNTER — Ambulatory Visit: Payer: BC Managed Care – PPO

## 2021-03-31 ENCOUNTER — Encounter: Payer: Self-pay | Admitting: *Deleted

## 2021-03-31 ENCOUNTER — Other Ambulatory Visit: Payer: Self-pay

## 2021-03-31 DIAGNOSIS — Z170 Estrogen receptor positive status [ER+]: Secondary | ICD-10-CM

## 2021-03-31 DIAGNOSIS — Z95828 Presence of other vascular implants and grafts: Secondary | ICD-10-CM

## 2021-03-31 DIAGNOSIS — C50411 Malignant neoplasm of upper-outer quadrant of right female breast: Secondary | ICD-10-CM | POA: Diagnosis not present

## 2021-03-31 DIAGNOSIS — Z5112 Encounter for antineoplastic immunotherapy: Secondary | ICD-10-CM | POA: Diagnosis not present

## 2021-03-31 LAB — CBC WITH DIFFERENTIAL (CANCER CENTER ONLY)
Abs Immature Granulocytes: 0.01 K/uL (ref 0.00–0.07)
Basophils Absolute: 0 K/uL (ref 0.0–0.1)
Basophils Relative: 1 %
Eosinophils Absolute: 0.1 K/uL (ref 0.0–0.5)
Eosinophils Relative: 2 %
HCT: 32.2 % — ABNORMAL LOW (ref 36.0–46.0)
Hemoglobin: 10.2 g/dL — ABNORMAL LOW (ref 12.0–15.0)
Immature Granulocytes: 0 %
Lymphocytes Relative: 14 %
Lymphs Abs: 0.4 K/uL — ABNORMAL LOW (ref 0.7–4.0)
MCH: 25.1 pg — ABNORMAL LOW (ref 26.0–34.0)
MCHC: 31.7 g/dL (ref 30.0–36.0)
MCV: 79.3 fL — ABNORMAL LOW (ref 80.0–100.0)
Monocytes Absolute: 0.3 K/uL (ref 0.1–1.0)
Monocytes Relative: 11 %
Neutro Abs: 2 K/uL (ref 1.7–7.7)
Neutrophils Relative %: 72 %
Platelet Count: 333 K/uL (ref 150–400)
RBC: 4.06 MIL/uL (ref 3.87–5.11)
RDW: 20.6 % — ABNORMAL HIGH (ref 11.5–15.5)
WBC Count: 2.8 K/uL — ABNORMAL LOW (ref 4.0–10.5)
nRBC: 0 % (ref 0.0–0.2)

## 2021-03-31 LAB — CMP (CANCER CENTER ONLY)
ALT: 35 U/L (ref 0–44)
AST: 38 U/L (ref 15–41)
Albumin: 3.8 g/dL (ref 3.5–5.0)
Alkaline Phosphatase: 102 U/L (ref 38–126)
Anion gap: 7 (ref 5–15)
BUN: 10 mg/dL (ref 6–20)
CO2: 27 mmol/L (ref 22–32)
Calcium: 9.2 mg/dL (ref 8.9–10.3)
Chloride: 105 mmol/L (ref 98–111)
Creatinine: 0.71 mg/dL (ref 0.44–1.00)
GFR, Estimated: >60 mL/min (ref >60)
Glucose, Bld: 92 mg/dL (ref 70–99)
Potassium: 3.8 mmol/L (ref 3.5–5.1)
Sodium: 139 mmol/L (ref 135–145)
Total Bilirubin: 0.4 mg/dL (ref 0.3–1.2)
Total Protein: 7 g/dL (ref 6.5–8.1)

## 2021-03-31 MED ORDER — SODIUM CHLORIDE 0.9% FLUSH
10.0000 mL | INTRAVENOUS | Status: DC | PRN
  Administered 2021-03-31: 14:00:00 10 mL

## 2021-03-31 MED ORDER — DIPHENHYDRAMINE HCL 50 MG/ML IJ SOLN
25.0000 mg | Freq: Once | INTRAMUSCULAR | Status: AC
  Administered 2021-03-31: 11:00:00 25 mg via INTRAVENOUS
  Filled 2021-03-31: qty 1

## 2021-03-31 MED ORDER — FAMOTIDINE IN NACL 20-0.9 MG/50ML-% IV SOLN
20.0000 mg | Freq: Once | INTRAVENOUS | Status: AC
  Administered 2021-03-31: 12:00:00 20 mg via INTRAVENOUS
  Filled 2021-03-31: qty 50

## 2021-03-31 MED ORDER — HEPARIN SOD (PORK) LOCK FLUSH 100 UNIT/ML IV SOLN
500.0000 [IU] | Freq: Once | INTRAVENOUS | Status: AC | PRN
  Administered 2021-03-31: 14:00:00 500 [IU]

## 2021-03-31 MED ORDER — SODIUM CHLORIDE 0.9 % IV SOLN
50.0000 mg/m2 | Freq: Once | INTRAVENOUS | Status: AC
  Administered 2021-03-31: 13:00:00 126 mg via INTRAVENOUS
  Filled 2021-03-31: qty 21

## 2021-03-31 MED ORDER — SODIUM CHLORIDE 0.9% FLUSH
10.0000 mL | Freq: Once | INTRAVENOUS | Status: AC
  Administered 2021-03-31: 10:00:00 10 mL

## 2021-03-31 MED ORDER — SODIUM CHLORIDE 0.9 % IV SOLN
10.0000 mg | Freq: Once | INTRAVENOUS | Status: AC
  Administered 2021-03-31: 11:00:00 10 mg via INTRAVENOUS
  Filled 2021-03-31: qty 10

## 2021-03-31 MED ORDER — SODIUM CHLORIDE 0.9 % IV SOLN: Freq: Once | INTRAVENOUS | Status: AC

## 2021-03-31 NOTE — Patient Instructions (Signed)
[facility]  Discharge Instructions: ?Thank you for choosing [facility] to provide your oncology and hematology care.  ? ?If you have a lab appointment with the [facility], please go directly to the [facility] and check in at the registration area. ?  ?Wear comfortable clothing and clothing appropriate for easy access to any Portacath or PICC line.  ? ?We strive to give you quality time with your provider. You may need to reschedule your appointment if you arrive late (15 or more minutes).  Arriving late affects you and other patients whose appointments are after yours.  Also, if you miss three or more appointments without notifying the office, you may be dismissed from the clinic at the provider?s discretion.    ?  ?For prescription refill requests, have your pharmacy contact our office and allow 72 hours for refills to be completed.   ? ?Today you received the following chemotherapy and/or immunotherapy agents: Taxol     ?  ?To help prevent nausea and vomiting after your treatment, we encourage you to take your nausea medication as directed. ? ?BELOW ARE SYMPTOMS THAT SHOULD BE REPORTED IMMEDIATELY: ?*FEVER GREATER THAN 100.4 F (38 ?C) OR HIGHER ?*CHILLS OR SWEATING ?*NAUSEA AND VOMITING THAT IS NOT CONTROLLED WITH YOUR NAUSEA MEDICATION ?*UNUSUAL SHORTNESS OF BREATH ?*UNUSUAL BRUISING OR BLEEDING ?*URINARY PROBLEMS (pain or burning when urinating, or frequent urination) ?*BOWEL PROBLEMS (unusual diarrhea, constipation, pain near the anus) ?TENDERNESS IN MOUTH AND THROAT WITH OR WITHOUT PRESENCE OF ULCERS (sore throat, sores in mouth, or a toothache) ?UNUSUAL RASH, SWELLING OR PAIN  ?UNUSUAL VAGINAL DISCHARGE OR ITCHING  ? ?Items with * indicate a potential emergency and should be followed up as soon as possible or go to the Emergency Department if any problems should occur. ? ?Please show the CHEMOTHERAPY ALERT CARD or IMMUNOTHERAPY ALERT CARD at check-in to the  Emergency Department and triage nurse. ? ?Should you have questions after your visit or need to cancel or reschedule your appointment, please contact [facility]  Dept: [phone]  and follow the prompts.  Office hours are 8:00 a.m. to 4:30 p.m. Monday - Friday. Please note that voicemails left after 4:00 p.m. may not be returned until the following business day.  We are closed weekends and major holidays. You have access to a nurse at all times for urgent questions. Please call the main number to the clinic Dept: [phone] and follow the prompts. ? ? ?For any non-urgent questions, you may also contact your provider using MyChart. We now offer e-Visits for anyone 60 and older to request care online for non-urgent symptoms. For details visit [email or web address]. ?  ?Also download the MyChart app! Go to the app store, search "MyChart", open the app, select [facility], and log in with your MyChart username and password. ? ?Due to Covid, a mask is required upon entering the hospital/clinic. If you do not have a mask, one will be given to you upon arrival. For doctor visits, patients may have 1 support person aged 27 or older with them. For treatment visits, patients cannot have anyone with them due to current Covid guidelines and our immunocompromised population.  ? ?

## 2021-04-01 ENCOUNTER — Ambulatory Visit
Admission: RE | Admit: 2021-04-01 | Discharge: 2021-04-01 | Disposition: A | Discharge status: Home / Self Care | Payer: BC Managed Care – PPO | Source: Ambulatory Visit | Attending: Hematology and Oncology | Admitting: Hematology and Oncology

## 2021-04-01 DIAGNOSIS — C50411 Malignant neoplasm of upper-outer quadrant of right female breast: Secondary | ICD-10-CM

## 2021-04-01 DIAGNOSIS — Z170 Estrogen receptor positive status [ER+]: Secondary | ICD-10-CM

## 2021-04-01 IMAGING — MR MR BREAST BILAT WO/W CM
9 of 15 series · 30 of 48 positions shown · IV contrast (gadavist)
Comparison: Head CT [DATE], ultrasound and mammogram exams of
the breast [DATE] and earlier from [REDACTED]

CLINICAL DATA: Patient is diagnosed with breast cancer at 20 weeks
gestational age. A prenatal lab NIPT was markedly abnormal.
Full-body noncontrast MRI was performed at [facility] showing abnormal
RIGHT axillary lymph nodes and subpectoral adenopathy concerning for
malignancy. On [DATE], patient had ultrasound-guided core biopsy
of mass in the 12 o'clock location of the RIGHT breast 12
centimeters from the nipple showing invasive ductal carcinoma with
necrosis and calcifications. Biopsy of mass in the 3 o'clock
location showed invasive ductal carcinoma with necrosis and
calcifications. Biopsy of LEFT axillary lymph node showed invasive
ductal carcinoma.
TECHNIQUE: Multiplanar, multisequence MR images of both breasts were obtained
prior to and following the intravenous administration of 10 ml of
Gadavist

[Series 2: t2_tirm_tra ipat (a-p) · axial · 3.0mm · 0.78mm/px · 1 of 63 slices shown]
[im 1/63]
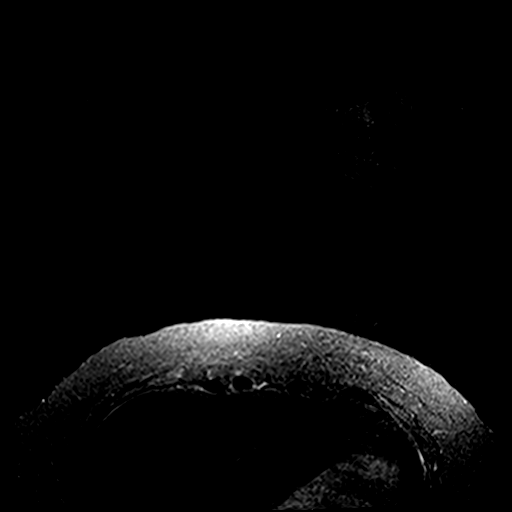

[Series 3: fl3d pre-cm no · axial · non-contrast · 1.2mm · 1.04mm/px · z∈[-54,+118]mm · 4 of 144 slices shown]
[im 1/144]
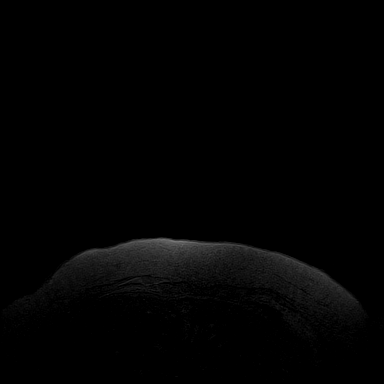
[im 48/144]
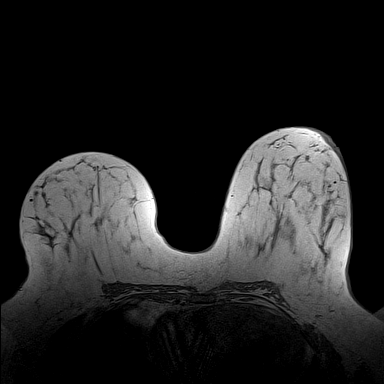
[im 96/144]
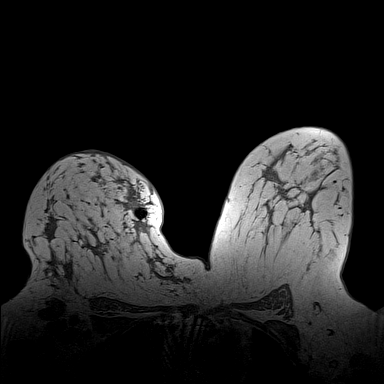
[im 144/144]
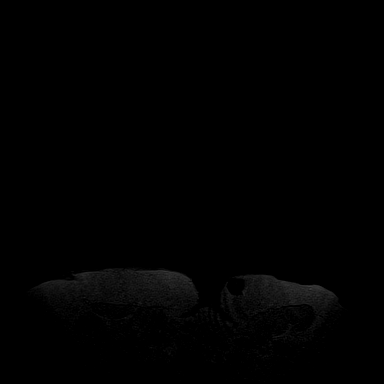

[Series 4: fl3d pre-cm · axial · non-contrast · 1.2mm · 1.04mm/px · z∈[-54,+118]mm · 4 of 144 slices shown (1 of 2)]
[im 1/144]
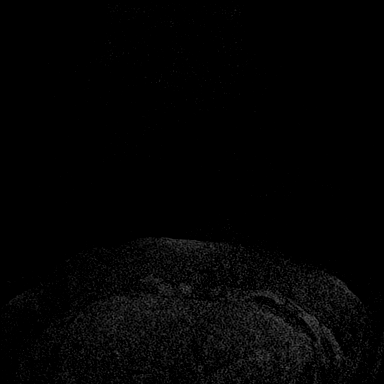
[im 48/144]
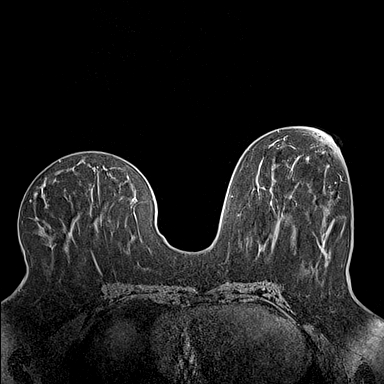
[im 96/144]
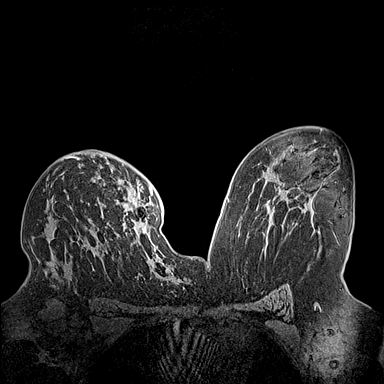
[im 144/144]
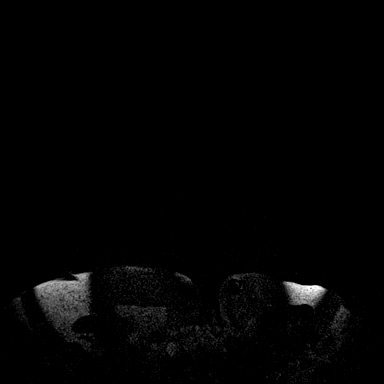

[Series 6: fl3d pre-cm · axial · non-contrast · 1.2mm · 1.04mm/px · z∈[-55,+116]mm · 4 of 144 slices shown (2 of 2)]
[im 1/144]
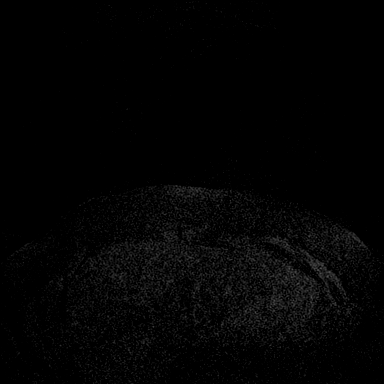
[im 48/144]
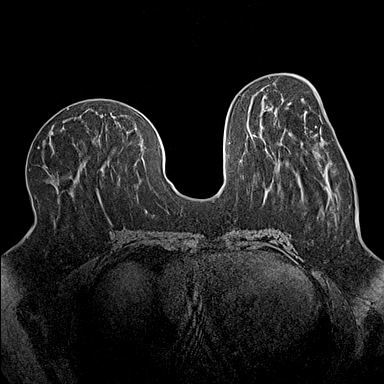
[im 96/144]
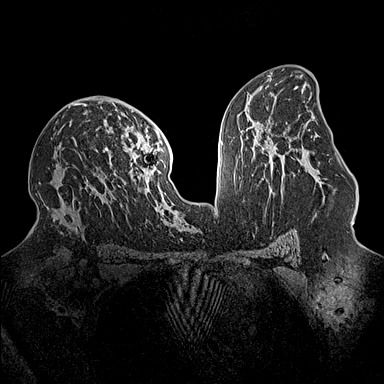
[im 144/144]
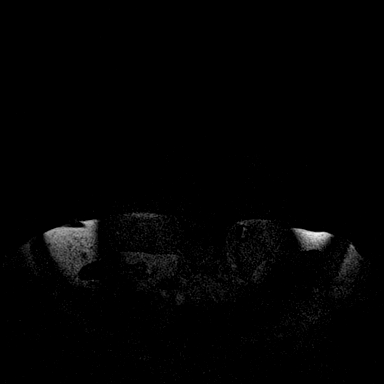

[Series 7: fl3d post-cm 20 · axial · 1.2mm · 1.04mm/px · z∈[-55,+116]mm · 5 of 144 slices shown (1 of 3)]
[im 1/144]
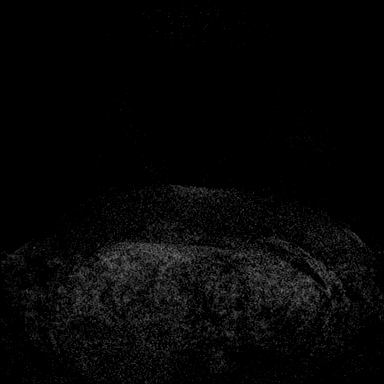
[im 36/144]
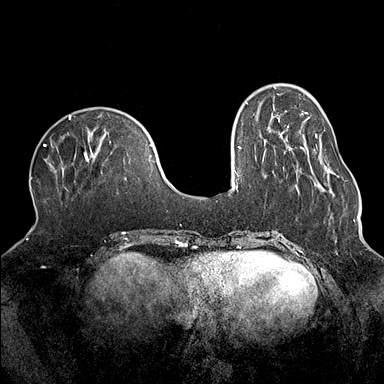
[im 72/144]
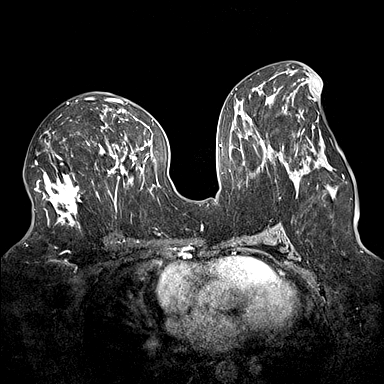
[im 108/144]
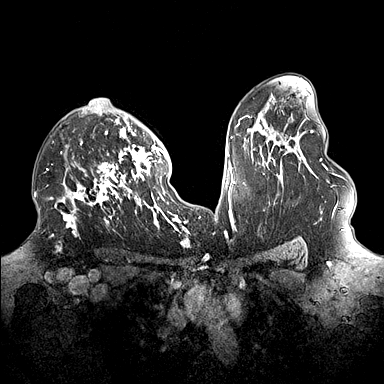
[im 144/144]
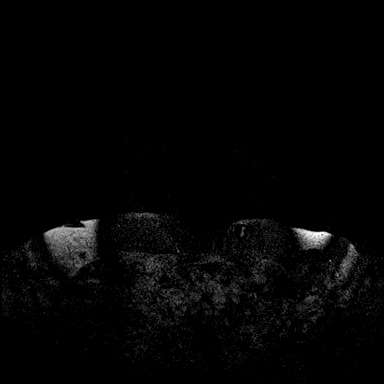

[Series 8: fl3d post-cm 20 · axial · 1.2mm · 1.04mm/px · z∈[-55,+116]mm · 5 of 144 slices shown (2 of 3)]
[im 1/144]
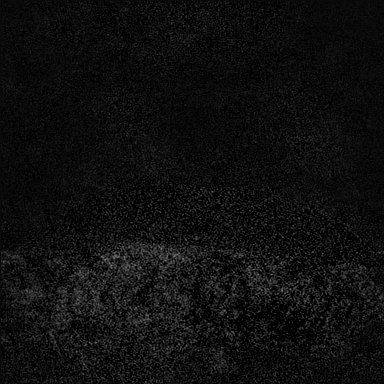
[im 36/144]
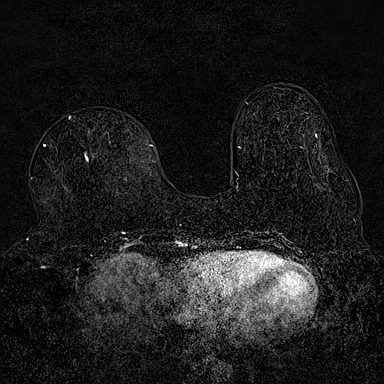
[im 72/144]
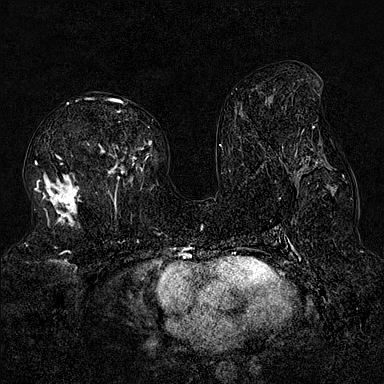
[im 108/144]
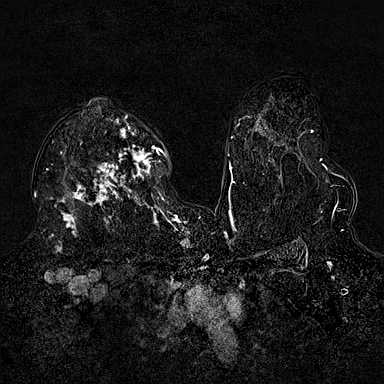
[im 144/144]
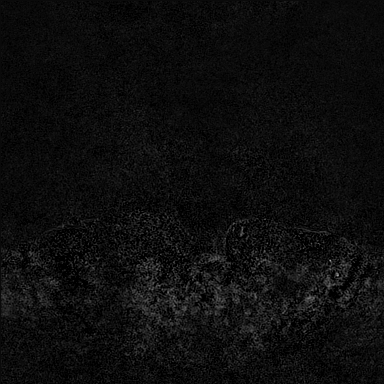

[Series 9: fl3d post-cm 20 · axial · 172.8mm · 1.04mm/px · 1 of 1 slices shown (3 of 3)]
[im 1/1]
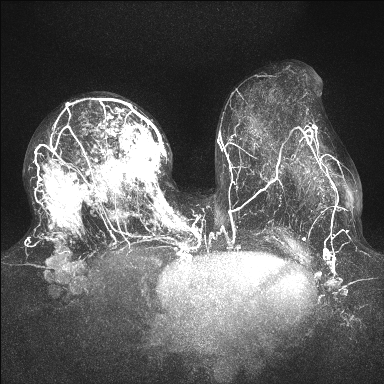

[Series 10: fl3d post-cm 3 · axial · 1.2mm · 1.04mm/px · z∈[-55,+116]mm · 5 of 144 slices shown (1 of 2)]
[im 1/144]
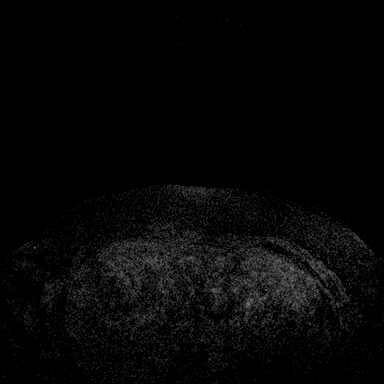
[im 36/144]
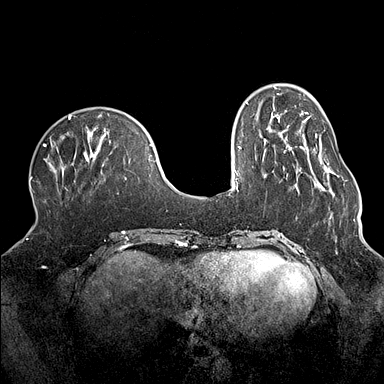
[im 72/144]
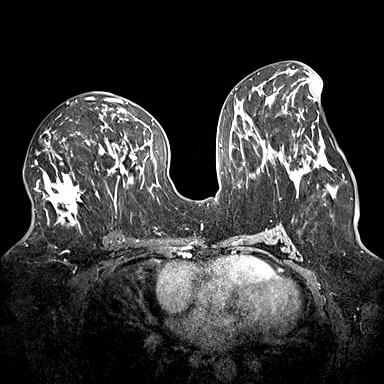
[im 108/144]
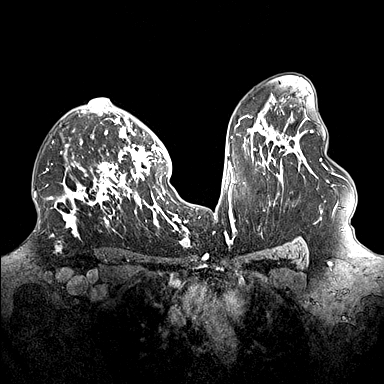
[im 144/144]
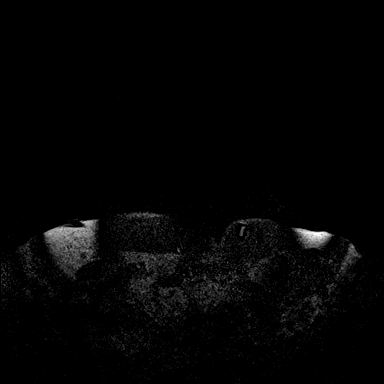

[Series 11: fl3d post-cm 3 · axial · 1.2mm · 1.04mm/px · 1 of 144 slices shown (2 of 2)]
[im 1/144]
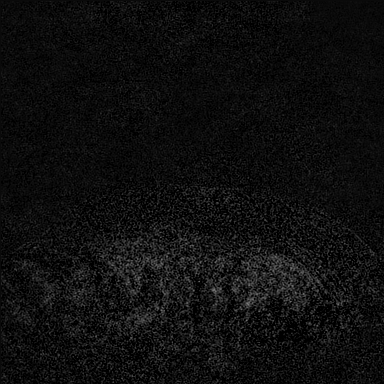

[30 of 48 positions shown; findings below may reference images not displayed]

On [DATE], patient underwent stereotactic guided core biopsy of
calcifications in the retroareolar 6 o'clock location, showing
invasive ductal carcinoma with calcifications and lymphovascular
space invasion, grade 3.

Additionally, patient has suspicious masses in the 1:30 o'clock
location the RIGHT breast, 18 centimeters from nipple measuring 1
centimeter, and the 11 o'clock location RIGHT breast 10 centimeters
from the nipple measuring 1.0 centimeters. Although not biopsied,
tissue marker clips were placed in these lesions.

Patient has undergone neoadjuvant treatment while pregnant. Patient
is now postpartum. Followup for treatment.

EXAM:
BILATERAL BREAST MRI WITH AND WITHOUT CONTRAST
Three-dimensional MR images were rendered by post-processing of the
original MR data on an independent workstation. The
three-dimensional MR images were interpreted, and findings are
reported in the following complete MRI report for this study. Three
dimensional images were evaluated at the independent interpreting
workstation using the DynaCAD thin client.
FINDINGS: Breast composition: b. Scattered fibroglandular tissue.

Background parenchymal enhancement: Mild

Right breast: Non mass enhancement with heterogeneous kinetics,
including washout kinetics spans all quadrants of the RIGHT breast.
The enhancement primarily involves the UPPER INNER and UPPER OUTER
quadrants and spans 13.3 (MEDIAL to LATERAL) x
(anterior-posterior) x 6.9 (superior to inferior) centimeters. Most
confluent enhancement is identified in the UPPER-OUTER QUADRANT.
Four tissue marker clips are identified in the RIGHT breast: MEDIAL
retroareolar region (image 65 of series 3), UPPER INNER QUADRANT
(image 51 of series 3), UPPER central (image 41 of series 3, and
UPPER INNER quadrant (image 29 of series 3). Note is made of skin
thickening along the anterior aspect of the RIGHT breast and
periareolar region, asymmetric compared to the LEFT.

Left breast: No mass or abnormal enhancement. LEFT-sided
Port-A-Cath.

Lymph nodes: Confluent adenopathy identified extending throughout
the RIGHT axilla, involving level I, level II, and level III. The
largest lymph node in the level I station is proximally
centimeters (image 5 of series 2). The largest lymph node in the
level III station is 1.4 centimeters (image 5 of series 2).

Ancillary findings:  None
IMPRESSION: 1. Extensive disease throughout the RIGHT breast, spanning at least
13.3 centimeters (MEDIAL to LATERAL).
2. Confluent significant adenopathy involving level I, level II, and
level III nodal stations of the RIGHT axilla.
3. LEFT breast is negative.

RECOMMENDATION:
Treatment plan for known RIGHT breast malignancy.

BI-RADS CATEGORY  6: Known biopsy-proven malignancy.

## 2021-04-01 MED ORDER — GADOBUTROL 1 MMOL/ML IV SOLN
10.0000 mL | Freq: Once | INTRAVENOUS | Status: AC | PRN
  Administered 2021-04-01: 10 mL via INTRAVENOUS

## 2021-04-03 ENCOUNTER — Other Ambulatory Visit: Payer: Self-pay | Admitting: Hematology and Oncology

## 2021-04-04 ENCOUNTER — Encounter: Payer: Self-pay | Admitting: *Deleted

## 2021-04-07 ENCOUNTER — Inpatient Hospital Stay: Payer: BC Managed Care – PPO

## 2021-04-07 ENCOUNTER — Other Ambulatory Visit: Payer: Self-pay

## 2021-04-07 VITALS — BP 133/78 | HR 77 | Temp 98.3°F | Resp 18 | Wt 278.0 lb

## 2021-04-07 DIAGNOSIS — Z5112 Encounter for antineoplastic immunotherapy: Secondary | ICD-10-CM | POA: Diagnosis not present

## 2021-04-07 LAB — CBC WITH DIFFERENTIAL (CANCER CENTER ONLY)
Abs Immature Granulocytes: 0.01 10*3/uL (ref 0.00–0.07)
Basophils Absolute: 0 10*3/uL (ref 0.0–0.1)
Basophils Relative: 1 %
Eosinophils Absolute: 0 10*3/uL (ref 0.0–0.5)
Eosinophils Relative: 2 %
HCT: 31.1 % — ABNORMAL LOW (ref 36.0–46.0)
Hemoglobin: 9.9 g/dL — ABNORMAL LOW (ref 12.0–15.0)
Immature Granulocytes: 1 %
Lymphocytes Relative: 16 %
Lymphs Abs: 0.4 10*3/uL — ABNORMAL LOW (ref 0.7–4.0)
MCH: 25.4 pg — ABNORMAL LOW (ref 26.0–34.0)
MCHC: 31.8 g/dL (ref 30.0–36.0)
MCV: 79.9 fL — ABNORMAL LOW (ref 80.0–100.0)
Monocytes Absolute: 0.3 10*3/uL (ref 0.1–1.0)
Monocytes Relative: 15 %
Neutro Abs: 1.5 10*3/uL — ABNORMAL LOW (ref 1.7–7.7)
Neutrophils Relative %: 65 %
Platelet Count: 394 10*3/uL (ref 150–400)
RBC: 3.89 MIL/uL (ref 3.87–5.11)
RDW: 20.4 % — ABNORMAL HIGH (ref 11.5–15.5)
WBC Count: 2.2 10*3/uL — ABNORMAL LOW (ref 4.0–10.5)
nRBC: 0 % (ref 0.0–0.2)

## 2021-04-07 LAB — CMP (CANCER CENTER ONLY)
ALT: 33 U/L (ref 0–44)
AST: 29 U/L (ref 15–41)
Albumin: 3.9 g/dL (ref 3.5–5.0)
Alkaline Phosphatase: 99 U/L (ref 38–126)
Anion gap: 7 (ref 5–15)
BUN: 15 mg/dL (ref 6–20)
CO2: 27 mmol/L (ref 22–32)
Calcium: 9.3 mg/dL (ref 8.9–10.3)
Chloride: 105 mmol/L (ref 98–111)
Creatinine: 0.87 mg/dL (ref 0.44–1.00)
GFR, Estimated: >60 mL/min (ref >60)
Glucose, Bld: 95 mg/dL (ref 70–99)
Potassium: 3.8 mmol/L (ref 3.5–5.1)
Sodium: 139 mmol/L (ref 135–145)
Total Bilirubin: 0.2 mg/dL — ABNORMAL LOW (ref 0.3–1.2)
Total Protein: 7.3 g/dL (ref 6.5–8.1)

## 2021-04-07 MED ORDER — SODIUM CHLORIDE 0.9 % IV SOLN: Freq: Once | INTRAVENOUS | Status: AC

## 2021-04-07 MED ORDER — SODIUM CHLORIDE 0.9 % IV SOLN
10.0000 mg | Freq: Once | INTRAVENOUS | Status: AC
  Administered 2021-04-07: 10 mg via INTRAVENOUS
  Filled 2021-04-07: qty 10

## 2021-04-07 MED ORDER — SODIUM CHLORIDE 0.9 % IV SOLN
50.0000 mg/m2 | Freq: Once | INTRAVENOUS | Status: AC
  Administered 2021-04-07: 126 mg via INTRAVENOUS
  Filled 2021-04-07: qty 21

## 2021-04-07 MED ORDER — FAMOTIDINE IN NACL 20-0.9 MG/50ML-% IV SOLN
20.0000 mg | Freq: Once | INTRAVENOUS | Status: AC
  Administered 2021-04-07: 20 mg via INTRAVENOUS
  Filled 2021-04-07: qty 50

## 2021-04-07 MED ORDER — HEPARIN SOD (PORK) LOCK FLUSH 100 UNIT/ML IV SOLN
500.0000 [IU] | Freq: Once | INTRAVENOUS | Status: AC | PRN
  Administered 2021-04-07: 500 [IU]

## 2021-04-07 MED ORDER — SODIUM CHLORIDE 0.9% FLUSH
10.0000 mL | INTRAVENOUS | Status: DC | PRN
  Administered 2021-04-07: 10 mL

## 2021-04-07 MED ORDER — SODIUM CHLORIDE 0.9% FLUSH
10.0000 mL | Freq: Once | INTRAVENOUS | Status: AC
  Administered 2021-04-07: 10 mL

## 2021-04-07 MED ORDER — DIPHENHYDRAMINE HCL 50 MG/ML IJ SOLN
25.0000 mg | Freq: Once | INTRAMUSCULAR | Status: AC
  Administered 2021-04-07: 25 mg via INTRAVENOUS
  Filled 2021-04-07: qty 1

## 2021-04-08 ENCOUNTER — Ambulatory Visit: Payer: BC Managed Care – PPO

## 2021-04-08 ENCOUNTER — Other Ambulatory Visit: Payer: BC Managed Care – PPO

## 2021-04-11 ENCOUNTER — Other Ambulatory Visit: Payer: Self-pay | Admitting: Hematology and Oncology

## 2021-04-14 ENCOUNTER — Encounter: Payer: Self-pay | Admitting: Adult Health

## 2021-04-14 ENCOUNTER — Inpatient Hospital Stay: Payer: BC Managed Care – PPO

## 2021-04-14 ENCOUNTER — Inpatient Hospital Stay (HOSPITAL_BASED_OUTPATIENT_CLINIC_OR_DEPARTMENT_OTHER): Payer: BC Managed Care – PPO | Admitting: Adult Health

## 2021-04-14 ENCOUNTER — Other Ambulatory Visit: Payer: Self-pay

## 2021-04-14 VITALS — BP 130/75 | HR 88 | Temp 97.9°F | Resp 16 | Ht 64.0 in | Wt 281.1 lb

## 2021-04-14 DIAGNOSIS — Z5112 Encounter for antineoplastic immunotherapy: Secondary | ICD-10-CM | POA: Diagnosis not present

## 2021-04-14 DIAGNOSIS — C50411 Malignant neoplasm of upper-outer quadrant of right female breast: Secondary | ICD-10-CM | POA: Diagnosis not present

## 2021-04-14 DIAGNOSIS — O9A113 Malignant neoplasm complicating pregnancy, third trimester: Secondary | ICD-10-CM | POA: Diagnosis not present

## 2021-04-14 DIAGNOSIS — Z95828 Presence of other vascular implants and grafts: Secondary | ICD-10-CM

## 2021-04-14 DIAGNOSIS — Z170 Estrogen receptor positive status [ER+]: Secondary | ICD-10-CM | POA: Diagnosis not present

## 2021-04-14 LAB — CBC WITH DIFFERENTIAL (CANCER CENTER ONLY)
Abs Immature Granulocytes: 0.03 10*3/uL (ref 0.00–0.07)
Basophils Absolute: 0 10*3/uL (ref 0.0–0.1)
Basophils Relative: 1 %
Eosinophils Absolute: 0 10*3/uL (ref 0.0–0.5)
Eosinophils Relative: 1 %
HCT: 31.5 % — ABNORMAL LOW (ref 36.0–46.0)
Hemoglobin: 10.2 g/dL — ABNORMAL LOW (ref 12.0–15.0)
Immature Granulocytes: 1 %
Lymphocytes Relative: 11 %
Lymphs Abs: 0.4 10*3/uL — ABNORMAL LOW (ref 0.7–4.0)
MCH: 25.6 pg — ABNORMAL LOW (ref 26.0–34.0)
MCHC: 32.4 g/dL (ref 30.0–36.0)
MCV: 78.9 fL — ABNORMAL LOW (ref 80.0–100.0)
Monocytes Absolute: 0.5 10*3/uL (ref 0.1–1.0)
Monocytes Relative: 12 %
Neutro Abs: 2.9 10*3/uL (ref 1.7–7.7)
Neutrophils Relative %: 74 %
Platelet Count: 348 10*3/uL (ref 150–400)
RBC: 3.99 MIL/uL (ref 3.87–5.11)
RDW: 19.8 % — ABNORMAL HIGH (ref 11.5–15.5)
WBC Count: 3.9 10*3/uL — ABNORMAL LOW (ref 4.0–10.5)
nRBC: 0 % (ref 0.0–0.2)

## 2021-04-14 LAB — CMP (CANCER CENTER ONLY)
ALT: 28 U/L (ref 0–44)
AST: 20 U/L (ref 15–41)
Albumin: 4 g/dL (ref 3.5–5.0)
Alkaline Phosphatase: 107 U/L (ref 38–126)
Anion gap: 7 (ref 5–15)
BUN: 11 mg/dL (ref 6–20)
CO2: 26 mmol/L (ref 22–32)
Calcium: 9.2 mg/dL (ref 8.9–10.3)
Chloride: 106 mmol/L (ref 98–111)
Creatinine: 0.79 mg/dL (ref 0.44–1.00)
GFR, Estimated: >60 mL/min (ref >60)
Glucose, Bld: 102 mg/dL — ABNORMAL HIGH (ref 70–99)
Potassium: 3.6 mmol/L (ref 3.5–5.1)
Sodium: 139 mmol/L (ref 135–145)
Total Bilirubin: 0.3 mg/dL (ref 0.3–1.2)
Total Protein: 7.1 g/dL (ref 6.5–8.1)

## 2021-04-14 MED ORDER — SODIUM CHLORIDE 0.9 % IV SOLN
420.0000 mg | Freq: Once | INTRAVENOUS | Status: AC
  Administered 2021-04-14: 420 mg via INTRAVENOUS
  Filled 2021-04-14: qty 14

## 2021-04-14 MED ORDER — ACETAMINOPHEN 325 MG PO TABS
650.0000 mg | ORAL_TABLET | Freq: Once | ORAL | Status: AC
  Administered 2021-04-14: 650 mg via ORAL
  Filled 2021-04-14: qty 2

## 2021-04-14 MED ORDER — FAMOTIDINE IN NACL 20-0.9 MG/50ML-% IV SOLN
20.0000 mg | Freq: Once | INTRAVENOUS | Status: AC
  Administered 2021-04-14: 20 mg via INTRAVENOUS
  Filled 2021-04-14: qty 50

## 2021-04-14 MED ORDER — SODIUM CHLORIDE 0.9 % IV SOLN
10.0000 mg | Freq: Once | INTRAVENOUS | Status: AC
  Administered 2021-04-14: 10 mg via INTRAVENOUS
  Filled 2021-04-14: qty 10

## 2021-04-14 MED ORDER — TRASTUZUMAB-DKST CHEMO 150 MG IV SOLR
6.0000 mg/kg | Freq: Once | INTRAVENOUS | Status: AC
  Administered 2021-04-14: 798 mg via INTRAVENOUS
  Filled 2021-04-14: qty 38

## 2021-04-14 MED ORDER — SODIUM CHLORIDE 0.9% FLUSH
10.0000 mL | Freq: Once | INTRAVENOUS | Status: AC
  Administered 2021-04-14: 10 mL

## 2021-04-14 MED ORDER — SODIUM CHLORIDE 0.9 % IV SOLN: Freq: Once | INTRAVENOUS | Status: AC

## 2021-04-14 MED ORDER — SODIUM CHLORIDE 0.9 % IV SOLN
50.0000 mg/m2 | Freq: Once | INTRAVENOUS | Status: AC
  Administered 2021-04-14: 126 mg via INTRAVENOUS
  Filled 2021-04-14: qty 21

## 2021-04-14 MED ORDER — DIPHENHYDRAMINE HCL 50 MG/ML IJ SOLN
25.0000 mg | Freq: Once | INTRAMUSCULAR | Status: AC
  Administered 2021-04-14: 25 mg via INTRAVENOUS
  Filled 2021-04-14: qty 1

## 2021-04-14 NOTE — Progress Notes (Signed)
[facility] Follow up: ?  ? ?Evelyn Axe, MD ?[address] ?[place] [address] ? ? ?DIAGNOSIS:  Cancer Staging  ?Malignant neoplasm of upper-outer quadrant of right breast in female, estrogen receptor positive ([facility]) ?Staging form: Breast, AJCC 8th Edition ?- Clinical: Stage IV (cT3, cN2, pM1, G2, ER+, PR-, HER2+) - Signed by Evelyn Lose, MD on 12/15/2020 ?Histologic grading system: 3 grade system ?- Pathologic: No stage assigned - Unsigned ? ? ?SUMMARY OF ONCOLOGIC HISTORY: ?Oncology History  ?Malignant neoplasm of upper-outer quadrant of right breast in female, estrogen receptor positive ([facility])  ?12/08/2020 Initial Diagnosis  ? 21-week pregnancy: MRI at [facility] noncontrast revealed right axillary mass 3.7 cm, subpectoral lymph nodes, left supraclavicular lymph node, multiple level 2 and 3 lymph nodes in the right axilla, 2 breast masses 1.1 cm largest size.  Mammogram in [facility] revealed 3 small masses at 12:00 1.2 cm, 0.6 cm, 0.9 cm and 5 abnormal axillary lymph nodes, pleomorphic calcifications measuring 10 cm, biopsy revealed grade 2 IDC with necrosis and calcifications ER 30%, PR 0%, HER2 positive, Ki-67 30 to 40% ?  ?12/15/2020 Cancer Staging  ? Staging form: Breast, AJCC 8th Edition ?- Clinical: Stage IV (cT3, cN2, pM1, G2, ER+, PR-, HER2+) - Signed by Evelyn Lose, MD on 12/15/2020 ?Histologic grading system: 3 grade system ? ?  ?12/24/2020 - 02/23/2021 Chemotherapy  ? Patient is on Treatment Plan : BREAST Adjuvant AC q21d  ?   ?12/29/2020 Genetic Testing  ? Negative hereditary cancer genetic testing: no pathogenic variants detected in [facility] BRCAPlus Panel or [facility] CustomNext-Cancer +RNAinsight Panel.  The report dates are 12/23/2020 and 12/29/2020.  ? ?The BRCAplus panel offered by [employer] and includes sequencing and deletion/duplication analysis for the following 8 genes: ATM, BRCA1, BRCA2, CDH1, CHEK2, PALB2, PTEN, and TP53.  The CustomNext-Cancer+RNAinsight panel offered by  [facility] includes sequencing and rearrangement analysis for the following 47 genes:  APC, ATM, AXIN2, BARD1, BMPR1A, BRCA1, BRCA2, BRIP1, CDH1, CDK4, CDKN2A, CHEK2, DICER1, EPCAM, GREM1, HOXB13, MEN1, MLH1, MSH2, MSH3, MSH6, MUTYH, NBN, NF1, NF2, NTHL1, PALB2, PMS2, POLD1, POLE, PTEN, RAD51C, RAD51D, RECQL, RET, SDHA, SDHAF2, SDHB, SDHC, SDHD, SMAD4, SMARCA4, STK11, TP53, TSC1, TSC2, and VHL.  RNA data is routinely analyzed for use in variant interpretation for all genes. ?  ?03/25/2021 -  Chemotherapy  ? Patient is on Treatment Plan : BREAST AC q21 days / Trastuzumab + Pertuzumab + Weekly Paclitaxel q21d  ?   ? ? ?CURRENT THERAPY: Taxol, Herceptin, Perjeta ? ?INTERVAL HISTORY: ?Evelyn Marshall 36 y.o. female returns for follow-up prior to receiving neoadjuvant weekly Taxol and every 3-week Herceptin Perjeta.  Today is cycle 2-day 1 and she will receive all 3 medications.  She notes that she is doing well other than some mild diarrhea which she manages with Imodium.  She denies any peripheral neuropathy and is using the compression gloves and socks to help prevent this.  Her blood pressure is under better control now that she has gotten in with Dr. Harl Marshall and cardiology.  Evelyn Marshall's most recent echocardiogram was completed on March 24, 2021 and showed a normal left ventricular ejection fraction of 60 to 65%. ? ?[name] is somewhat fatigued however she does have a 21-week-old at home.  Her baby is doing well.  She did go to the [employer] [facility] recently to look at the site and she thinks that she is going to join so that she can participate in water aerobics there.  Every tells  me she would like a referral to pelvic physical therapy because she is having stress incontinence after her delivery. ? ? ?Patient Active Problem List  ? Diagnosis Date Noted  ? Postpartum care following vaginal delivery 03/16/2021  ? Malignant neoplasm affecting pregnancy in third trimester 03/15/2021  ? Malignant neoplasm affecting pregnancy,  antepartum, third trimester 03/13/2021  ? Port-A-Cath in place 12/31/2020  ? Genetic testing 12/27/2020  ? Malignant neoplasm of upper-outer quadrant of right breast in female, estrogen receptor positive ([facility]) 12/15/2020  ? Family history of breast cancer 12/15/2020  ? Severe preeclampsia, third trimester 02/25/2019  ? Hyperemesis gravidarum 08/20/2018  ? Preeclampsia, third trimester 11/15/2015  ? Pre-eclampsia during pregnancy in third trimester, antepartum 11/15/2015  ? Preeclampsia 11/15/2015  ? Mild preeclampsia 11/12/2015  ? Hypokalemia, inadequate intake 06/17/2015  ? Ptyalism 05/18/2015  ? Chronic hypertension in pregnancy 05/18/2015  ? Morbid obesity with BMI of 40.0-44.9, adult ([facility]) 05/18/2015  ? Rh negative, maternal 05/18/2015  ? ? ?has No Known Allergies. ? ?MEDICAL HISTORY: ?Past Medical History:  ?Diagnosis Date  ? Abnormal Pap smear 2012  ? colpo result benign  ? Cancer [facility])   ? Chlamydia   ? Family history of breast cancer 12/15/2020  ? History of pre-eclampsia   ? HPV (human papilloma virus) infection   ? Hyperemesis   ? Hypokalemia   ? Pregnancy induced hypertension   ? Ptyalism   ? Vaginal Pap smear, abnormal   ? ? ?SURGICAL HISTORY: ?Past Surgical History:  ?Procedure Laterality Date  ? COLPOSCOPY    ? IR IMAGING GUIDED PORT INSERTION  12/24/2020  ? WISDOM TOOTH EXTRACTION    ? ? ?SOCIAL HISTORY: ?Social History  ? ?Socioeconomic History  ? Marital status: Married  ?  Spouse name: Evelyn Marshall  ? Number of children: 3  ? Years of education: 65  ? Highest education level: Bachelor's degree (e.g., BA, AB, BS)  ?Occupational History  ? Occupation: Pharmacist, hospital  ?Tobacco Use  ? Smoking status: Never  ? Smokeless tobacco: Never  ?Vaping Use  ? Vaping Use: Never used  ?Substance and Sexual Activity  ? Alcohol use: No  ? Drug use: No  ? Sexual activity: Yes  ?Other Topics Concern  ? Not on file  ?Social History Narrative  ? Not on file  ? ?Social Determinants of Health  ? ?Financial Resource Strain: Not on file   ?Food Insecurity: Not on file  ?Transportation Needs: Not on file  ?Physical Activity: Not on file  ?Stress: Not on file  ?Social Connections: Not on file  ?Intimate Partner Violence: Not on file  ? ? ?FAMILY HISTORY: ?Family History  ?Problem Relation Age of Onset  ? Heart disease Father   ? Hypertension Father   ? Stroke Father   ? Breast cancer Paternal Grandmother   ?     dx 52s  ? Stroke Paternal Grandmother   ? Breast cancer Cousin 31  ?     maternal female cousin  ? ? ?Review of Systems  ?Constitutional:  Positive for fatigue (Mild). Negative for appetite change, chills, fever and unexpected weight change.  ?HENT:   Negative for hearing loss, lump/mass and trouble swallowing.   ?Eyes:  Negative for eye problems and icterus.  ?Respiratory:  Negative for chest tightness, cough and shortness of breath.   ?Cardiovascular:  Negative for chest pain, leg swelling and palpitations.  ?Gastrointestinal:  Negative for abdominal distention, abdominal pain, constipation, diarrhea, nausea and vomiting.  ?Endocrine: Negative for hot flashes.  ?  Genitourinary:  Negative for difficulty urinating.   ?Musculoskeletal:  Negative for arthralgias.  ?Skin:  Negative for itching and rash.  ?Neurological:  Negative for dizziness, extremity weakness, headaches and numbness.  ?Hematological:  Negative for adenopathy. Does not bruise/bleed easily.  ?Psychiatric/Behavioral:  Negative for depression. The patient is not nervous/anxious.    ? ? ?PHYSICAL EXAMINATION ? ?ECOG PERFORMANCE STATUS: 1 - Symptomatic but completely ambulatory ? ?Vitals:  ? 04/14/21 1150  ?BP: 130/75  ?Pulse: 88  ?Resp: 16  ?Temp: 97.9 ?F (36.6 ?C)  ?SpO2: 100%  ? ? ?Physical Exam ?Constitutional:   ?   General: She is not in acute distress. ?   Appearance: Normal appearance. She is not toxic-appearing.  ?HENT:  ?   Head: Normocephalic and atraumatic.  ?Eyes:  ?   General: No scleral icterus. ?Cardiovascular:  ?   Rate and Rhythm: Normal rate and regular rhythm.  ?    Pulses: Normal pulses.  ?   Heart sounds: Normal heart sounds.  ?Pulmonary:  ?   Effort: Pulmonary effort is normal.  ?   Breath sounds: Normal breath sounds.  ?Abdominal:  ?   General: Abdomen is fl

## 2021-04-14 NOTE — Assessment & Plan Note (Signed)
Evelyn Marshall is a 36 year old woman with clinical stage IV estrogen receptor positive breast cancer here today to continue neoadjuvant chemotherapy with Taxol, Herceptin, Perjeta. ? ?1.  Breast cancer: She will proceed with chemotherapy today with Taxol Herceptin and Perjeta.  She is tolerating this well.  Her echocardiogram is up-to-date and she is not experiencing peripheral neuropathy.  I refills like her breast cancer is getting softer which is a good sign and indication that the treatment is working. ? ?2.Urge incontinence: I placed a referral for pelvic rehab to help her with this. ? ?I really will proceed with treatment today.  She will return next week for labs and Taxol, and in 2 weeks for labs, follow-up with Dr. Lindi Adie, Taxol. ?

## 2021-04-15 ENCOUNTER — Other Ambulatory Visit: Payer: Self-pay | Admitting: Hematology and Oncology

## 2021-04-21 ENCOUNTER — Inpatient Hospital Stay: Payer: BC Managed Care – PPO

## 2021-04-21 ENCOUNTER — Other Ambulatory Visit: Payer: Self-pay

## 2021-04-21 VITALS — BP 121/74 | HR 85 | Temp 97.9°F | Resp 18 | Wt 281.0 lb

## 2021-04-21 DIAGNOSIS — Z95828 Presence of other vascular implants and grafts: Secondary | ICD-10-CM

## 2021-04-21 DIAGNOSIS — C50411 Malignant neoplasm of upper-outer quadrant of right female breast: Secondary | ICD-10-CM

## 2021-04-21 DIAGNOSIS — Z5112 Encounter for antineoplastic immunotherapy: Secondary | ICD-10-CM | POA: Diagnosis not present

## 2021-04-21 LAB — CMP (CANCER CENTER ONLY)
ALT: 35 U/L (ref 0–44)
AST: 29 U/L (ref 15–41)
Albumin: 4 g/dL (ref 3.5–5.0)
Alkaline Phosphatase: 97 U/L (ref 38–126)
Anion gap: 8 (ref 5–15)
BUN: 13 mg/dL (ref 6–20)
CO2: 27 mmol/L (ref 22–32)
Calcium: 9.3 mg/dL (ref 8.9–10.3)
Chloride: 105 mmol/L (ref 98–111)
Creatinine: 0.88 mg/dL (ref 0.44–1.00)
GFR, Estimated: >60 mL/min (ref >60)
Glucose, Bld: 113 mg/dL — ABNORMAL HIGH (ref 70–99)
Potassium: 3.6 mmol/L (ref 3.5–5.1)
Sodium: 140 mmol/L (ref 135–145)
Total Bilirubin: 0.5 mg/dL (ref 0.3–1.2)
Total Protein: 7.3 g/dL (ref 6.5–8.1)

## 2021-04-21 LAB — CBC WITH DIFFERENTIAL (CANCER CENTER ONLY)
Abs Immature Granulocytes: 0.02 10*3/uL (ref 0.00–0.07)
Basophils Absolute: 0 10*3/uL (ref 0.0–0.1)
Basophils Relative: 1 %
Eosinophils Absolute: 0.1 10*3/uL (ref 0.0–0.5)
Eosinophils Relative: 1 %
HCT: 30.7 % — ABNORMAL LOW (ref 36.0–46.0)
Hemoglobin: 10.1 g/dL — ABNORMAL LOW (ref 12.0–15.0)
Immature Granulocytes: 1 %
Lymphocytes Relative: 9 %
Lymphs Abs: 0.4 10*3/uL — ABNORMAL LOW (ref 0.7–4.0)
MCH: 26.2 pg (ref 26.0–34.0)
MCHC: 32.9 g/dL (ref 30.0–36.0)
MCV: 79.5 fL — ABNORMAL LOW (ref 80.0–100.0)
Monocytes Absolute: 0.4 10*3/uL (ref 0.1–1.0)
Monocytes Relative: 9 %
Neutro Abs: 3.2 10*3/uL (ref 1.7–7.7)
Neutrophils Relative %: 79 %
Platelet Count: 313 10*3/uL (ref 150–400)
RBC: 3.86 MIL/uL — ABNORMAL LOW (ref 3.87–5.11)
RDW: 19.4 % — ABNORMAL HIGH (ref 11.5–15.5)
WBC Count: 4.1 10*3/uL (ref 4.0–10.5)
nRBC: 0 % (ref 0.0–0.2)

## 2021-04-21 MED ORDER — SODIUM CHLORIDE 0.9% FLUSH
10.0000 mL | Freq: Once | INTRAVENOUS | Status: AC
  Administered 2021-04-21: 10 mL

## 2021-04-21 MED ORDER — DIPHENHYDRAMINE HCL 50 MG/ML IJ SOLN
25.0000 mg | Freq: Once | INTRAMUSCULAR | Status: AC
  Administered 2021-04-21: 25 mg via INTRAVENOUS
  Filled 2021-04-21: qty 1

## 2021-04-21 MED ORDER — SODIUM CHLORIDE 0.9 % IV SOLN
10.0000 mg | Freq: Once | INTRAVENOUS | Status: AC
  Administered 2021-04-21: 10 mg via INTRAVENOUS
  Filled 2021-04-21: qty 10

## 2021-04-21 MED ORDER — FAMOTIDINE IN NACL 20-0.9 MG/50ML-% IV SOLN
20.0000 mg | Freq: Once | INTRAVENOUS | Status: AC
  Administered 2021-04-21: 20 mg via INTRAVENOUS
  Filled 2021-04-21: qty 50

## 2021-04-21 MED ORDER — HEPARIN SOD (PORK) LOCK FLUSH 100 UNIT/ML IV SOLN
500.0000 [IU] | Freq: Once | INTRAVENOUS | Status: AC | PRN
  Administered 2021-04-21: 500 [IU]

## 2021-04-21 MED ORDER — SODIUM CHLORIDE 0.9% FLUSH
10.0000 mL | INTRAVENOUS | Status: DC | PRN
  Administered 2021-04-21: 10 mL

## 2021-04-21 MED ORDER — SODIUM CHLORIDE 0.9 % IV SOLN: Freq: Once | INTRAVENOUS | Status: AC

## 2021-04-21 MED ORDER — SODIUM CHLORIDE 0.9 % IV SOLN
50.0000 mg/m2 | Freq: Once | INTRAVENOUS | Status: AC
  Administered 2021-04-21: 126 mg via INTRAVENOUS
  Filled 2021-04-21: qty 21

## 2021-04-21 NOTE — Patient Instructions (Signed)
[facility]  Discharge Instructions: ?Thank you for choosing [facility] to provide your oncology and hematology care.  ? ?If you have a lab appointment with the [facility], please go directly to the [facility] and check in at the registration area. ?  ?Wear comfortable clothing and clothing appropriate for easy access to any Portacath or PICC line.  ? ?We strive to give you quality time with your provider. You may need to reschedule your appointment if you arrive late (15 or more minutes).  Arriving late affects you and other patients whose appointments are after yours.  Also, if you miss three or more appointments without notifying the office, you may be dismissed from the clinic at the provider?s discretion.    ?  ?For prescription refill requests, have your pharmacy contact our office and allow 72 hours for refills to be completed.   ? ?Today you received the following chemotherapy and/or immunotherapy agents: Taxol     ?  ?To help prevent nausea and vomiting after your treatment, we encourage you to take your nausea medication as directed. ? ?BELOW ARE SYMPTOMS THAT SHOULD BE REPORTED IMMEDIATELY: ?*FEVER GREATER THAN 100.4 F (38 ?C) OR HIGHER ?*CHILLS OR SWEATING ?*NAUSEA AND VOMITING THAT IS NOT CONTROLLED WITH YOUR NAUSEA MEDICATION ?*UNUSUAL SHORTNESS OF BREATH ?*UNUSUAL BRUISING OR BLEEDING ?*URINARY PROBLEMS (pain or burning when urinating, or frequent urination) ?*BOWEL PROBLEMS (unusual diarrhea, constipation, pain near the anus) ?TENDERNESS IN MOUTH AND THROAT WITH OR WITHOUT PRESENCE OF ULCERS (sore throat, sores in mouth, or a toothache) ?UNUSUAL RASH, SWELLING OR PAIN  ?UNUSUAL VAGINAL DISCHARGE OR ITCHING  ? ?Items with * indicate a potential emergency and should be followed up as soon as possible or go to the Emergency Department if any problems should occur. ? ?Please show the CHEMOTHERAPY ALERT CARD or IMMUNOTHERAPY ALERT CARD at check-in to the  Emergency Department and triage nurse. ? ?Should you have questions after your visit or need to cancel or reschedule your appointment, please contact [facility]  Dept: [phone]  and follow the prompts.  Office hours are 8:00 a.m. to 4:30 p.m. Monday - Friday. Please note that voicemails left after 4:00 p.m. may not be returned until the following business day.  We are closed weekends and major holidays. You have access to a nurse at all times for urgent questions. Please call the main number to the clinic Dept: [phone] and follow the prompts. ? ? ?For any non-urgent questions, you may also contact your provider using MyChart. We now offer e-Visits for anyone 47 and older to request care online for non-urgent symptoms. For details visit [email or web address]. ?  ?Also download the MyChart app! Go to the app store, search "MyChart", open the app, select [facility], and log in with your MyChart username and password. ? ?Due to Covid, a mask is required upon entering the hospital/clinic. If you do not have a mask, one will be given to you upon arrival. For doctor visits, patients may have 1 support person aged 11 or older with them. For treatment visits, patients cannot have anyone with them due to current Covid guidelines and our immunocompromised population.  ? ?

## 2021-04-22 ENCOUNTER — Inpatient Hospital Stay (HOSPITAL_COMMUNITY): Admit: 2021-04-22 | Payer: Self-pay

## 2021-04-27 NOTE — Progress Notes (Signed)
? ?Patient Care Team: ?[name], MD as PCP - General (Family Medicine) ?Janina Mayo, MD as PCP - Cardiology (Cardiology) ?Nicholas Lose, MD as Consulting Physician (Hematology and Oncology) ?Servando Salina, MD as Consulting Physician (Obstetrics and Gynecology) ?Irene Limbo, MD as Consulting Physician (Plastic Surgery) ?Rolm Bookbinder, MD as Consulting Physician (General Surgery) ? ?DIAGNOSIS:  ?Encounter Diagnosis  ?Name Primary?  ? Malignant neoplasm of upper-outer quadrant of right breast in female, estrogen receptor positive ([place])   ? ? ?SUMMARY OF ONCOLOGIC HISTORY: ?Oncology History  ?Malignant neoplasm of upper-outer quadrant of right breast in female, estrogen receptor positive ([place])  ?12/08/2020 Initial Diagnosis  ? 21-week pregnancy: MRI at [facility] noncontrast revealed right axillary mass 3.7 cm, subpectoral lymph nodes, left supraclavicular lymph node, multiple level 2 and 3 lymph nodes in the right axilla, 2 breast masses 1.1 cm largest size.  Mammogram in [place] revealed 3 small masses at 12:00 1.2 cm, 0.6 cm, 0.9 cm and 5 abnormal axillary lymph nodes, pleomorphic calcifications measuring 10 cm, biopsy revealed grade 2 IDC with necrosis and calcifications ER 30%, PR 0%, HER2 positive, Ki-67 30 to 40% ?  ?12/15/2020 Cancer Staging  ? Staging form: Breast, AJCC 8th Edition ?- Clinical: Stage IV (cT3, cN2, pM1, G2, ER+, PR-, HER2+) - Signed by Nicholas Lose, MD on 12/15/2020 ?Histologic grading system: 3 grade system ? ?  ?12/24/2020 - 02/23/2021 Chemotherapy  ? Patient is on Treatment Plan : BREAST Adjuvant AC q21d  ?   ?12/29/2020 Genetic Testing  ? Negative hereditary cancer genetic testing: no pathogenic variants detected in [facility] BRCAPlus Panel or [facility] CustomNext-Cancer +RNAinsight Panel.  The report dates are 12/23/2020 and 12/29/2020.  ? ?The BRCAplus panel offered by [employer] and includes sequencing and deletion/duplication analysis for the following 8 genes: ATM, BRCA1,  BRCA2, CDH1, CHEK2, PALB2, PTEN, and TP53.  The CustomNext-Cancer+RNAinsight panel offered by [employer] [facility] includes sequencing and rearrangement analysis for the following 47 genes:  APC, ATM, AXIN2, BARD1, BMPR1A, BRCA1, BRCA2, BRIP1, CDH1, CDK4, CDKN2A, CHEK2, DICER1, EPCAM, GREM1, HOXB13, MEN1, MLH1, MSH2, MSH3, MSH6, MUTYH, NBN, NF1, NF2, NTHL1, PALB2, PMS2, POLD1, POLE, PTEN, RAD51C, RAD51D, RECQL, RET, SDHA, SDHAF2, SDHB, SDHC, SDHD, SMAD4, SMARCA4, STK11, TP53, TSC1, TSC2, and VHL.  RNA data is routinely analyzed for use in variant interpretation for all genes. ?  ?03/25/2021 -  Chemotherapy  ? Patient is on Treatment Plan : BREAST AC q21 days / Trastuzumab + Pertuzumab + Weekly Paclitaxel q21d  ?   ? ? ?CHIEF COMPLIANT: Follow-up of breast cancer on Taxol cycle 6 ? ?INTERVAL HISTORY: KAYDENCE MENARD is a 36 y.o. with above-mentioned history of cancer. ?She presents to the clinic today for a follow-up and cycle 6 Taxol. She is tolerating the Taxol. Complains of some diarrhea after treatment but its manageable. Denies numbness in feet and fingers.  ?She is trying to lose weight and therefore she is trying to control her carbs and exercise more often ? ?ALLERGIES:  has No Known Allergies. ? ?MEDICATIONS:  ?Current Outpatient Medications  ?Medication Sig Dispense Refill  ? Doxylamine-Pyridoxine 10-10 MG TBEC Take 1 tablet by mouth 3 (three) times daily as needed (nausea).    ? ibuprofen (ADVIL) 600 MG tablet Take 1 tablet (600 mg total) by mouth every 6 (six) hours as needed for mild pain or moderate pain. 30 tablet 11  ? Iron-FA-B Cmp-C-Biot-Probiotic (FUSION PLUS) CAPS Take 1 capsule by mouth daily.    ? labetalol (NORMODYNE) 200 MG tablet Take 1 tablet (  200 mg total) by mouth 2 (two) times daily. 60 tablet 11  ? lidocaine-prilocaine (EMLA) cream Apply 1 application. topically as needed (apply once weekly prior to port a cath needle insertion.). 30 g 0  ? NIFEdipine (ADALAT CC) 30 MG 24 hr tablet Take 1 tablet  (30 mg total) by mouth daily. 30 tablet 11  ? ondansetron (ZOFRAN) 8 MG tablet Take 1 tablet (8 mg total) by mouth every 8 (eight) hours as needed for nausea or vomiting. 20 tablet 0  ? prochlorperazine (COMPAZINE) 10 MG tablet TAKE 1 TABLET(10 MG) BY MOUTH EVERY 6 HOURS AS NEEDED FOR NAUSEA OR VOMITING 30 tablet 0  ? ?No current facility-administered medications for this visit.  ? ? ?PHYSICAL EXAMINATION: ?ECOG PERFORMANCE STATUS: 1 - Symptomatic but completely ambulatory ? ?Vitals:  ? 04/28/21 1142 04/28/21 1143  ?BP: (!) 160/108 (!) 172/107  ?Pulse: 71   ?Resp: 19   ?Temp: 97.8 ?F (36.6 ?C)   ?SpO2: 100%   ? ?Filed Weights  ? 04/28/21 1142  ?Weight: 278 lb 1.6 oz (126.1 kg)  ? ?  ? ?LABORATORY DATA:  ?I have reviewed the data as listed ? ?  Latest Ref Rng & Units 04/21/2021  ?  8:37 AM 04/14/2021  ? 11:38 AM 04/07/2021  ?  9:42 AM  ?CMP  ?Glucose 70 - 99 mg/dL 113   102   95    ?BUN 6 - 20 mg/dL _0 ?Creatinine 0.44 - 1.00 mg/dL 0.88   0.79   0.87    ?Sodium 135 - 145 mmol/L 140   139   139    ?Potassium 3.5 - 5.1 mmol/L 3.6   3.6   3.8    ?Chloride 98 - 111 mmol/L 105   106   105    ?CO2 22 - 32 mmol/L _1 ?Calcium 8.9 - 10.3 mg/dL 9.3   9.2   9.3    ?Total Protein 6.5 - 8.1 g/dL 7.3   7.1   7.3    ?Total Bilirubin 0.3 - 1.2 mg/dL 0.5   0.3   0.2    ?Alkaline Phos 38 - 126 U/L 97   107   99    ?AST 15 - 41 U/L _2 ?ALT 0 - 44 U/L 35   28   33    ? ? ?Lab Results  ?Component Value Date  ? WBC 4.0 04/28/2021  ? HGB 10.1 (L) 04/28/2021  ? HCT 32.2 (L) 04/28/2021  ? MCV 81.1 04/28/2021  ? PLT 338 04/28/2021  ? NEUTROABS 2.8 04/28/2021  ? ? ?ASSESSMENT & PLAN:  ?Malignant neoplasm of upper-outer quadrant of right breast in female, estrogen receptor positive ([place]) ?12/08/2020:21-week pregnancy: MRI at [facility] noncontrast revealed right axillary mass 3.7 cm, subpectoral lymph nodes, left supraclavicular lymph node, multiple level 2 and 3 lymph nodes in the right axilla, 2 breast masses 1.1  cm largest size.  Mammogram in [place] revealed 3 small masses at 12:00 1.2 cm, 0.6 cm, 0.9 cm and 5 abnormal axillary lymph nodes, pleomorphic calcifications measuring 10 cm, biopsy revealed grade 2 IDC with necrosis and calcifications ER 30%, PR 0%, HER2 positive, Ki-67 30 to 40% ?  ?Treatment plan: ?1.  Neoadjuvant chemotherapy with Adriamycin and Cytoxan every 3 weeks x4 started 12/24/2020 completed 02/23/2021 ?2. delivery of the baby ?3.  Continue neoadjuvant therapy  with Taxol Herceptin and Perjeta ?4.  Mastectomy with ALND versus breast conserving surgery if she has a dramatic response ?5.  Adjuvant radiation therapy ?6.  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 ? ?Current treatment: Cycle 6 Taxol (every 3 week Herceptin Perjeta) ?She tolerated Herceptin and Perjeta extremely well.  She had 1 episode of diarrhea. ?Leukopenia: ANC 2.8 (now at a reduced dosage of Taxol 50 mg per metered squared) ?  ?Return to clinic weekly for chemo and every other week for follow-up with [place]. ? ? ? ?No orders of the defined types were placed in this encounter. ? ?The patient has a good understanding of the overall plan. she agrees with it. she will call with any problems that may develop before the next visit here. ?Total time spent: 30 mins including face to face time and time spent for planning, charting and co-ordination of care ? ? Harriette Ohara, MD ?04/28/21 ? ? ? I Gardiner Coins am scribing for Dr. Lindi Adie ? ?I have reviewed the above documentation for accuracy and completeness, and I agree with the above. ?. ?

## 2021-04-28 ENCOUNTER — Inpatient Hospital Stay: Payer: BC Managed Care – PPO

## 2021-04-28 ENCOUNTER — Ambulatory Visit: Payer: BC Managed Care – PPO | Attending: Adult Health | Admitting: Physical Therapy

## 2021-04-28 ENCOUNTER — Inpatient Hospital Stay: Payer: BC Managed Care – PPO | Attending: Hematology and Oncology

## 2021-04-28 ENCOUNTER — Other Ambulatory Visit: Payer: Self-pay

## 2021-04-28 ENCOUNTER — Encounter: Payer: Self-pay | Admitting: Physical Therapy

## 2021-04-28 ENCOUNTER — Inpatient Hospital Stay (HOSPITAL_BASED_OUTPATIENT_CLINIC_OR_DEPARTMENT_OTHER): Payer: BC Managed Care – PPO | Admitting: Hematology and Oncology

## 2021-04-28 VITALS — BP 156/95

## 2021-04-28 DIAGNOSIS — M6281 Muscle weakness (generalized): Secondary | ICD-10-CM | POA: Diagnosis not present

## 2021-04-28 DIAGNOSIS — Z5112 Encounter for antineoplastic immunotherapy: Secondary | ICD-10-CM | POA: Diagnosis present

## 2021-04-28 DIAGNOSIS — Z5111 Encounter for antineoplastic chemotherapy: Secondary | ICD-10-CM | POA: Insufficient documentation

## 2021-04-28 DIAGNOSIS — C50411 Malignant neoplasm of upper-outer quadrant of right female breast: Secondary | ICD-10-CM | POA: Insufficient documentation

## 2021-04-28 DIAGNOSIS — R293 Abnormal posture: Secondary | ICD-10-CM | POA: Diagnosis not present

## 2021-04-28 DIAGNOSIS — Z170 Estrogen receptor positive status [ER+]: Secondary | ICD-10-CM

## 2021-04-28 DIAGNOSIS — N3941 Urge incontinence: Secondary | ICD-10-CM | POA: Insufficient documentation

## 2021-04-28 DIAGNOSIS — D701 Agranulocytosis secondary to cancer chemotherapy: Secondary | ICD-10-CM | POA: Diagnosis not present

## 2021-04-28 DIAGNOSIS — Z95828 Presence of other vascular implants and grafts: Secondary | ICD-10-CM

## 2021-04-28 DIAGNOSIS — R279 Unspecified lack of coordination: Secondary | ICD-10-CM | POA: Insufficient documentation

## 2021-04-28 DIAGNOSIS — C773 Secondary and unspecified malignant neoplasm of axilla and upper limb lymph nodes: Secondary | ICD-10-CM | POA: Insufficient documentation

## 2021-04-28 LAB — CBC WITH DIFFERENTIAL (CANCER CENTER ONLY)
Abs Immature Granulocytes: 0.03 10*3/uL (ref 0.00–0.07)
Basophils Absolute: 0 10*3/uL (ref 0.0–0.1)
Basophils Relative: 1 %
Eosinophils Absolute: 0 10*3/uL (ref 0.0–0.5)
Eosinophils Relative: 1 %
HCT: 32.2 % — ABNORMAL LOW (ref 36.0–46.0)
Hemoglobin: 10.1 g/dL — ABNORMAL LOW (ref 12.0–15.0)
Immature Granulocytes: 1 %
Lymphocytes Relative: 15 %
Lymphs Abs: 0.6 10*3/uL — ABNORMAL LOW (ref 0.7–4.0)
MCH: 25.4 pg — ABNORMAL LOW (ref 26.0–34.0)
MCHC: 31.4 g/dL (ref 30.0–36.0)
MCV: 81.1 fL (ref 80.0–100.0)
Monocytes Absolute: 0.4 10*3/uL (ref 0.1–1.0)
Monocytes Relative: 11 %
Neutro Abs: 2.8 10*3/uL (ref 1.7–7.7)
Neutrophils Relative %: 71 %
Platelet Count: 338 10*3/uL (ref 150–400)
RBC: 3.97 MIL/uL (ref 3.87–5.11)
RDW: 18.9 % — ABNORMAL HIGH (ref 11.5–15.5)
WBC Count: 4 10*3/uL (ref 4.0–10.5)
nRBC: 0 % (ref 0.0–0.2)

## 2021-04-28 LAB — CMP (CANCER CENTER ONLY)
ALT: 45 U/L — ABNORMAL HIGH (ref 0–44)
AST: 31 U/L (ref 15–41)
Albumin: 4 g/dL (ref 3.5–5.0)
Alkaline Phosphatase: 103 U/L (ref 38–126)
Anion gap: 7 (ref 5–15)
BUN: 11 mg/dL (ref 6–20)
CO2: 29 mmol/L (ref 22–32)
Calcium: 9.4 mg/dL (ref 8.9–10.3)
Chloride: 106 mmol/L (ref 98–111)
Creatinine: 0.81 mg/dL (ref 0.44–1.00)
GFR, Estimated: >60 mL/min (ref >60)
Glucose, Bld: 91 mg/dL (ref 70–99)
Potassium: 3.6 mmol/L (ref 3.5–5.1)
Sodium: 142 mmol/L (ref 135–145)
Total Bilirubin: 0.3 mg/dL (ref 0.3–1.2)
Total Protein: 7.4 g/dL (ref 6.5–8.1)

## 2021-04-28 MED ORDER — SODIUM CHLORIDE 0.9% FLUSH
10.0000 mL | Freq: Once | INTRAVENOUS | Status: AC
  Administered 2021-04-28: 10 mL

## 2021-04-28 MED ORDER — SODIUM CHLORIDE 0.9% FLUSH
10.0000 mL | INTRAVENOUS | Status: DC | PRN
  Administered 2021-04-28: 10 mL

## 2021-04-28 MED ORDER — FAMOTIDINE IN NACL 20-0.9 MG/50ML-% IV SOLN
20.0000 mg | Freq: Once | INTRAVENOUS | Status: AC
  Administered 2021-04-28: 20 mg via INTRAVENOUS
  Filled 2021-04-28: qty 50

## 2021-04-28 MED ORDER — SODIUM CHLORIDE 0.9 % IV SOLN: Freq: Once | INTRAVENOUS | Status: AC

## 2021-04-28 MED ORDER — SODIUM CHLORIDE 0.9 % IV SOLN
50.0000 mg/m2 | Freq: Once | INTRAVENOUS | Status: AC
  Administered 2021-04-28: 126 mg via INTRAVENOUS
  Filled 2021-04-28: qty 21

## 2021-04-28 MED ORDER — HEPARIN SOD (PORK) LOCK FLUSH 100 UNIT/ML IV SOLN
500.0000 [IU] | Freq: Once | INTRAVENOUS | Status: AC | PRN
  Administered 2021-04-28: 500 [IU]

## 2021-04-28 MED ORDER — SODIUM CHLORIDE 0.9 % IV SOLN
10.0000 mg | Freq: Once | INTRAVENOUS | Status: AC
  Administered 2021-04-28: 10 mg via INTRAVENOUS
  Filled 2021-04-28: qty 10

## 2021-04-28 MED ORDER — DIPHENHYDRAMINE HCL 50 MG/ML IJ SOLN
25.0000 mg | Freq: Once | INTRAMUSCULAR | Status: AC
  Administered 2021-04-28: 25 mg via INTRAVENOUS
  Filled 2021-04-28: qty 1

## 2021-04-28 NOTE — Progress Notes (Signed)
? ?Patient Care Team: ?[name], MD as PCP - General (Family Medicine) ?Janina Mayo, MD as PCP - Cardiology (Cardiology) ?Nicholas Lose, MD as Consulting Physician (Hematology and Oncology) ?Servando Salina, MD as Consulting Physician (Obstetrics and Gynecology) ?Irene Limbo, MD as Consulting Physician (Plastic Surgery) ?Rolm Bookbinder, MD as Consulting Physician (General Surgery) ? ?DIAGNOSIS:  ?Encounter Diagnosis  ?Name Primary?  ? Malignant neoplasm of upper-outer quadrant of right breast in female, estrogen receptor positive ([place]) Yes  ? ? ?SUMMARY OF ONCOLOGIC HISTORY: ?Oncology History  ?Malignant neoplasm of upper-outer quadrant of right breast in female, estrogen receptor positive ()  ?12/08/2020 Initial Diagnosis  ? 21-week pregnancy: MRI at [facility] noncontrast revealed right axillary mass 3.7 cm, subpectoral lymph nodes, left supraclavicular lymph node, multiple level 2 and 3 lymph nodes in the right axilla, 2 breast masses 1.1 cm largest size.  Mammogram in [place] revealed 3 small masses at 12:00 1.2 cm, 0.6 cm, 0.9 cm and 5 abnormal axillary lymph nodes, pleomorphic calcifications measuring 10 cm, biopsy revealed grade 2 IDC with necrosis and calcifications ER 30%, PR 0%, HER2 positive, Ki-67 30 to 40% ?  ?12/15/2020 Cancer Staging  ? Staging form: Breast, AJCC 8th Edition ?- Clinical: Stage IV (cT3, cN2, pM1, G2, ER+, PR-, HER2+) - Signed by Nicholas Lose, MD on 12/15/2020 ?Histologic grading system: 3 grade system ? ?  ?12/24/2020 - 02/23/2021 Chemotherapy  ? Patient is on Treatment Plan : BREAST Adjuvant AC q21d  ? ?  ?  ?12/29/2020 Genetic Testing  ? Negative hereditary cancer genetic testing: no pathogenic variants detected in [facility] BRCAPlus Panel or [facility] CustomNext-Cancer +RNAinsight Panel.  The report dates are 12/23/2020 and 12/29/2020.  ? ?The BRCAplus panel offered by [employer] and includes sequencing and deletion/duplication analysis for the following 8 genes: ATM,  BRCA1, BRCA2, CDH1, CHEK2, PALB2, PTEN, and TP53.  The CustomNext-Cancer+RNAinsight panel offered by [employer] [facility] includes sequencing and rearrangement analysis for the following 47 genes:  APC, ATM, AXIN2, BARD1, BMPR1A, BRCA1, BRCA2, BRIP1, CDH1, CDK4, CDKN2A, CHEK2, DICER1, EPCAM, GREM1, HOXB13, MEN1, MLH1, MSH2, MSH3, MSH6, MUTYH, NBN, NF1, NF2, NTHL1, PALB2, PMS2, POLD1, POLE, PTEN, RAD51C, RAD51D, RECQL, RET, SDHA, SDHAF2, SDHB, SDHC, SDHD, SMAD4, SMARCA4, STK11, TP53, TSC1, TSC2, and VHL.  RNA data is routinely analyzed for use in variant interpretation for all genes. ?  ?03/25/2021 -  Chemotherapy  ? Patient is on Treatment Plan : BREAST AC q21 days / Trastuzumab + Pertuzumab + Weekly Paclitaxel q21d  ? ?  ?  ? ? ?CHIEF COMPLIANT: Follow-up of breast cancer on Cycle 8 Taxol ? ?INTERVAL HISTORY: ALBERTHA BEATTIE is a  36 y.o. with above-mentioned history of cancer ?Currently on Taxol. She presents to the clinic today for a follow-up. She complain of some numbness in fingers and toes. She states of some diarrhea but she takes imodium so its manageable.  ? ?ALLERGIES:  has No Known Allergies. ? ?MEDICATIONS:  ?Current Outpatient Medications  ?Medication Sig Dispense Refill  ? Doxylamine-Pyridoxine 10-10 MG TBEC Take 1 tablet by mouth 3 (three) times daily as needed (nausea).    ? ibuprofen (ADVIL) 600 MG tablet Take 1 tablet (600 mg total) by mouth every 6 (six) hours as needed for mild pain or moderate pain. 30 tablet 11  ? Iron-FA-B Cmp-C-Biot-Probiotic (FUSION PLUS) CAPS Take 1 capsule by mouth daily.    ? labetalol (NORMODYNE) 200 MG tablet Take 1 tablet (200 mg total) by mouth 2 (two) times daily. 60 tablet 11  ?  lidocaine-prilocaine (EMLA) cream Apply 1 application. topically as needed (apply once weekly prior to port a cath needle insertion.). 30 g 0  ? NIFEdipine (ADALAT CC) 30 MG 24 hr tablet Take 1 tablet (30 mg total) by mouth daily. 30 tablet 11  ? ondansetron (ZOFRAN) 8 MG tablet Take 1 tablet (8 mg  total) by mouth every 8 (eight) hours as needed for nausea or vomiting. 20 tablet 0  ? prochlorperazine (COMPAZINE) 10 MG tablet TAKE 1 TABLET(10 MG) BY MOUTH EVERY 6 HOURS AS NEEDED FOR NAUSEA OR VOMITING 30 tablet 0  ? ?No current facility-administered medications for this visit.  ? ? ?PHYSICAL EXAMINATION: ?ECOG PERFORMANCE STATUS: 1 - Symptomatic but completely ambulatory ? ?Vitals:  ? 05/12/21 0831  ?BP: (!) 149/91  ?Pulse: 89  ?Resp: 18  ?Temp: (!) 97.2 ?F (36.2 ?C)  ?SpO2: 100%  ? ?Filed Weights  ? 05/12/21 0831  ?Weight: 277 lb 6.4 oz (125.8 kg)  ? ?  ? ?LABORATORY DATA:  ?I have reviewed the data as listed ? ?  Latest Ref Rng & Units 05/05/2021  ? 10:30 AM 04/28/2021  ? 11:36 AM 04/21/2021  ?  8:37 AM  ?CMP  ?Glucose 70 - 99 mg/dL 92   91   113    ?BUN 6 - 20 mg/dL _0 ?Creatinine 0.44 - 1.00 mg/dL 0.84   0.81   0.88    ?Sodium 135 - 145 mmol/L 141   142   140    ?Potassium 3.5 - 5.1 mmol/L 3.6   3.6   3.6    ?Chloride 98 - 111 mmol/L 107   106   105    ?CO2 22 - 32 mmol/L _1 ?Calcium 8.9 - 10.3 mg/dL 9.5   9.4   9.3    ?Total Protein 6.5 - 8.1 g/dL 7.6   7.4   7.3    ?Total Bilirubin 0.3 - 1.2 mg/dL 0.4   0.3   0.5    ?Alkaline Phos 38 - 126 U/L 103   103   97    ?AST 15 - 41 U/L _2 ?ALT 0 - 44 U/L 36   45   35    ? ? ?Lab Results  ?Component Value Date  ? WBC 3.4 (L) 05/12/2021  ? HGB 10.3 (L) 05/12/2021  ? HCT 31.5 (L) 05/12/2021  ? MCV 80.6 05/12/2021  ? PLT 372 05/12/2021  ? NEUTROABS 2.5 05/12/2021  ? ? ?ASSESSMENT & PLAN:  ?Malignant neoplasm of upper-outer quadrant of right breast in female, estrogen receptor positive ([place]) ?12/08/2020:21-week pregnancy: MRI at [place] [facility] noncontrast revealed right axillary mass 3.7 cm, subpectoral lymph nodes, left supraclavicular lymph node, multiple level 2 and 3 lymph nodes in the right axilla, 2 breast masses 1.1 cm largest size.  Mammogram in [place] revealed 3 small masses at 12:00 1.2 cm, 0.6 cm, 0.9 cm and 5 abnormal axillary  lymph nodes, pleomorphic calcifications measuring 10 cm, biopsy revealed grade 2 IDC with necrosis and calcifications ER 30%, PR 0%, HER2 positive, Ki-67 30 to 40% ?  ?Treatment plan: ?1.  Neoadjuvant chemotherapy with Adriamycin and Cytoxan every 3 weeks x4 started 12/24/2020 completed 02/23/2021 ?2. delivery of the baby ?3.  Continue neoadjuvant therapy with Taxol Herceptin and Perjeta ?4.  Mastectomy with ALND versus breast conserving surgery if she has a dramatic response ?5.  Adjuvant radiation therapy ?6.  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   ?Current treatment: Cycle 8 Taxol (every 3 week Herceptin Perjeta) ?She tolerated Herceptin and Perjeta extremely well.  She had a few episodes of diarrhea which were controlled with Imodium ?Leukopenia: ANC 2.8 (now at a reduced dosage of Taxol 50 mg per metered square) ?  ?We will order a breast MRI to be done on 06/02/2021.  I sent a message to Dr. Donne Hazel to see her afterwards. ?Return to clinic weekly for chemo and every other week for follow-up with [place]. ? ? ? ?Orders Placed This Encounter  ?Procedures  ? MR BREAST BILATERAL W WO CONTRAST INC CAD  ?  Standing Status:   Future  ?  Standing Expiration Date:   05/13/2022  ?  Order Specific Question:   If indicated for the ordered procedure, I authorize the administration of contrast media per Radiology protocol  ?  Answer:   Yes  ?  Order Specific Question:   What is the patient's sedation requirement?  ?  Answer:   No Sedation  ?  Order Specific Question:   Does the patient have a pacemaker or implanted devices?  ?  Answer:   No  ?  Order Specific Question:   Preferred imaging location?  ?  Answer:   [facility] (table limit-550lbs)  ?  Order Specific Question:   Release to patient  ?   Answer:   Immediate  ? ?The patient has a good understanding of the overall plan. she agrees with it. she will call with any problems that may develop before the next visit here. ?Total time spent: 30 mins incl

## 2021-04-28 NOTE — Therapy (Signed)
?OUTPATIENT PHYSICAL THERAPY FEMALE PELVIC EVALUATION ? ? ?Patient Name: Evelyn Marshall ?MRN: [id number] ?DOB:28-Nov-1985, 36 y.o., female ?Today's Date: 04/28/2021 ? ? ? ?Past Medical History:  ?Diagnosis Date  ? Abnormal Pap smear 2012  ? colpo result benign  ? Cancer [facility])   ? Chlamydia   ? Family history of breast cancer 12/15/2020  ? History of pre-eclampsia   ? HPV (human papilloma virus) infection   ? Hyperemesis   ? Hypokalemia   ? Pregnancy induced hypertension   ? Ptyalism   ? Vaginal Pap smear, abnormal   ? ?Past Surgical History:  ?Procedure Laterality Date  ? COLPOSCOPY    ? IR IMAGING GUIDED PORT INSERTION  12/24/2020  ? WISDOM TOOTH EXTRACTION    ? ?Patient Active Problem List  ? Diagnosis Date Noted  ? Postpartum care following vaginal delivery 03/16/2021  ? Malignant neoplasm affecting pregnancy in third trimester 03/15/2021  ? Malignant neoplasm affecting pregnancy, antepartum, third trimester 03/13/2021  ? Port-A-Cath in place 12/31/2020  ? Genetic testing 12/27/2020  ? Malignant neoplasm of upper-outer quadrant of right breast in female, estrogen receptor positive ([place]) 12/15/2020  ? Family history of breast cancer 12/15/2020  ? Severe preeclampsia, third trimester 02/25/2019  ? Hyperemesis gravidarum 08/20/2018  ? Preeclampsia, third trimester 11/15/2015  ? Pre-eclampsia during pregnancy in third trimester, antepartum 11/15/2015  ? Preeclampsia 11/15/2015  ? Mild preeclampsia 11/12/2015  ? Hypokalemia, inadequate intake 06/17/2015  ? Ptyalism 05/18/2015  ? Chronic hypertension in pregnancy 05/18/2015  ? Morbid obesity with BMI of 40.0-44.9, adult ([place]) 05/18/2015  ? Rh negative, maternal 05/18/2015  ? ? ?PCP: [name], MD ? ?REFERRING PROVIDER: Wilber Bihari Cornett* ? ?REFERRING DIAG: O9A.113 (ICD-10-CM) - Malignant neoplasm affecting pregnancy in third trimester ?O9A.113 (ICD-10-CM) - Malignant neoplasm affecting pregnancy, antepartum, third trimester ?C50.411,Z17.0 (ICD-10-CM) - Malignant  neoplasm of upper-outer quadrant of right breast in female, estrogen receptor positive ([place]) ? ?THERAPY DIAG:  ?No diagnosis found. ? ?ONSET DATE: 2006 with first pregnancy ? ?SUBJECTIVE:                                                                                                                                                                                          ? ?SUBJECTIVE STATEMENT: ?Pt reports initially having incontinence while pregnancy with first baby, then other that just intermittent sneezing, progressing to this with jumping or working then progressively gotten worse with each baby to now with stressors and urgency. With a cold will need to wear a depend, other than this wears a liner.  ?Has had a full loss while pregnant but usually will have small drops.  ? ?Fluid  intake: Yes: 64 oz per day   ? ?Patient confirms identification and approves PT to assess pelvic floor and treatment Yes ? ? ?PAIN:  ?Are you having pain? No ? ? ?PRECAUTIONS: Other: postpartum (fourth baby 03/16/21) ? ?WEIGHT BEARING RESTRICTIONS No ? ?FALLS:  ?Has patient fallen in last 6 months? No ? ?LIVING ENVIRONMENT: ?Lives with: lives with their family ?Lives in: House/apartment ? 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 ?PLOF: Independent though spouse needs to helps sometimes with postpartum lifting restrictions immediately after baby ? ?PATIENT GOALS to have less leakage ? ?PERTINENT HISTORY:  ?Malignant neoplasm of upper-outer quadrant of right breast in female, estrogen receptor positive ([place]) ?Staging form: Breast, AJCC 8th Edition ?- Clinical: Stage IV (cT3, cN2, pM1, G2, ER+, PR-, HER2+) - Signed by Nicholas Lose, MD on 12/15/2020 ?Initial Diagnosis 12/08/20; staging 12/15/20; chemo Adjuvant AC q21d 122/22-02/23/21; genetic testing 12/29/20; chemo Patient is on Treatment Plan : BREAST AC q21 days / Trastuzumab + Pertuzumab + Weekly Paclitaxel q21d   03/25/21-present  ?Sexual  abuse: No ? ?BOWEL MOVEMENT ?Pain with bowel movement: No ?Type of bowel movement:Type (Bristol Stool Scale) 4, Frequency at least every other day, and Strain Yes ?Fully empty rectum: Yes:   ?Leakage: No ?Pads: No ?Fiber supplement: No ? ?URINATION ?Pain with urination: No ?Fully empty bladder: Yes:   ?Stream: Strong ?Urgency: Yes:   ?Frequency: no  ?Leakage: Walking to the bathroom, Coughing, Sneezing, Laughing, Exercise, and jumping ?Pads: Yes: 2 ? ?INTERCOURSE ?Pain with intercourse:  none pain prior to delivery and hasn't had intercourse since  ?Ability to have vaginal penetration:  Yes: but reports since postpartum and CA treatments she has been having a lot of dryness at vulva and vaginally, pt instructed to discuss this with MD for recommendations and PT educated pt on feminine moisturizers without hormones or chemicals to further discuss with MD ? ? ?PREGNANCY ?Vaginal deliveries 4 ?Tearing Yes: with first three ?C-section deliveries 0 ?Currently pregnant No ? ?PROLAPSE ?None ? ? ? ?OBJECTIVE:  ? ?DIAGNOSTIC FINDINGS:  ? ? ?COGNITION: ? Overall cognitive status: Within functional limits for tasks assessed   ?  ?SENSATION: ? Light touch: Appears intact ? Proprioception: Appears intact ? ?MUSCLE LENGTH: ?Hamstrings and adductors limited by 25% bil ? ? ? ? ?POSTURE:  ?Rounded shoulders, posterior pelvic tilt ? ?LUMBARAROM/PROM ? ?Bil side bending and rotation limited by 25% all others WFL ? ?LE ROM: ?Bil LE WFL ? ?LE MMT: ? ?Bil hip abduction 3+/5 all others 4/5 ? ?PELVIC MMT: deferred until cleared from MD at postpartum visit ?  ?MMT  ?04/28/2021  ?Vaginal   ?Internal Anal Sphincter   ?External Anal Sphincter   ?Puborectalis   ?Diastasis Recti No separation felt this date at rest or with shoulders lifted from table however pt did demonstrate difficulty with maintain a minimal crunch position   ?(Blank rows = not tested) ? ?      PALPATION: ?  General  no TTP throughout but mild restrictions noted in bil lower  abdominal region ? ?              External Perineal Exam deferred ?              ?              Internal Pelvic Floor deferred ? ?TONE: ?deferred ? ?PROLAPSE: ?deferred ? ?TODAY'S TREATMENT  ?EVAL 04/28/2021: Examination completed, findings reviewed, pt educated on POC,  HEP, urge drill, knack method Pt motivated to participate in PT and agreeable to attempt recommendations.  ?  ? ? ?PATIENT EDUCATION:  ?Education details: ZB2M_0 H, urge drill, knack method, natural moisturizers without hormones or chemicals  ?Person educated: Patient ?Education method: Explanation, Demonstration, Tactile cues, Verbal cues, and Handouts ?Education comprehension: verbalized understanding and returned demonstration ? ? ?HOME EXERCISE PROGRAM: ?ZB2M_1 H ? ?ASSESSMENT: ? ?CLINICAL IMPRESSION: ?Patient is a 36 y.o. female  who was seen today for physical therapy evaluation and treatment for urinary incontinence with urgency and stressors and pt currently undergoing weekly chemo due to breast cancer, also in postpartum phase with fourth child born in February 2023. See above for details and findings. Internal pelvic floor assessment not completed until pt cleared from PT at initial postpartum visit which pt reports is next week. Pt educated on HEP for initiating attempts to tighten pelvic floor and relax pelvic floor, diaphragmatic breathing, urge drill, bladder irritants, the knack method, and pt reported having vaginal dryness since postpartum and educated on natural no hormonal options and to further ask MD for recommendations. Pt agreed. Pt would benefit from additional PT to further address deficits.   ? ? ?OBJECTIVE IMPAIRMENTS decreased coordination, decreased endurance, decreased mobility, decreased strength, increased fascial restrictions, impaired flexibility, improper body mechanics, and postural dysfunction.  ? ?ACTIVITY LIMITATIONS community activity and yard work.  ? ?PERSONAL FACTORS Past/current experiences, Time since onset  of injury/illness/exacerbation, and 3+ comorbidities: x4 vaginal births, currently undergoing CA treatments for breast CA  are also affecting patient's functional outcome.  ? ? ?REHAB POTENTIAL: Good ? ?CLINI

## 2021-04-28 NOTE — Patient Instructions (Addendum)
?THE KNACK ? ?The Knack is a strategy you may use to help to reduce or prevent leakage or passing of urine, gas or feces during an activity that causes downward force on the pelvic floor muscles.   ? ?Activities that can cause downward pressure on the pelvic floor muscles include coughing, sneezing, laughing, bending, lifting, and transitioning from different body positions such as from laying down to sitting up and sitting to standing. ? ?To perform The Knack, consciously squeeze and lift your pelvic floor muscles to perform a strong, well-timed pelvic muscle contraction BEFORE AND DURING these activities above.  As your contraction gets more coordinated and your muscles get stronger, you will become more effective in controlling your experience of incontinence or gas passing during these activities.   ? ?Bladder Irritants ? ?Certain foods and beverages can be irritating to the bladder.  Avoiding these irritants may decrease your symptoms of urinary urgency, frequency or bladder pain.  Even reducing your intake can help with your symptoms.  Not everyone is sensitive to all bladder irritants, so you may consider focusing on one irritant at a time, removing or reducing your intake of that irritant for 7-10 days to see if this change helps your symptoms.  Water intake is also very important. ? ?Below is a list of bladder irritants. ? ?Drinks: alcohol, carbonated beverages, caffeinated beverages such as coffee and tea, drinks with artificial sweeteners, citrus juices, apple juice, tomato juice ? ?Foods: tomatoes and tomato based foods, spicy food, sugar and artificial sweeteners, vinegar, chocolate, raw onion, apples, citrus fruits, pineapple, cranberries, tomatoes, strawberries, plums, peaches, cantaloupe ? ?Other: acidic urine (too concentrated) - see water intake info below ? ?Substitutes you can try that are NOT irritating to the bladder: cooked onion, pears, papayas, sun-brewed decaf teas, watermelons, non-citrus  herbal teas, apricots, kava and low-acid instant drinks (Postum). ? ? ? ?WATER INTAKE: Remember to drink lots of water (aim for fluid intake of half your body weight with 2/3 of fluids being water).  You may be limiting fluids due to fear of leakage, but this can actually worsen urgency symptoms due to highly concentrated urine.  Water helps balance the pH of your urine so it doesn't become too acidic - acidic urine is a bladder irritant! ? ?Urge Incontinence ? ?Ideal urination frequency is every 2-4 wakeful hours, which equates to 5-8 times within a 24-hour period.   ?Urge incontinence is leakage that occurs when the bladder muscle contracts, creating a sudden need to go before getting to the bathroom.   ?Going too often when your bladder isn't actually full can disrupt the body's automatic signals to store and hold urine longer, which will increase urgency/frequency.  In this case, the bladder ?is running the show? and strategies can be learned to retrain this pattern.   ?One should be able to control the first urge to urinate, at around 134m.  The bladder can hold up to a ?grande latte,? or 4042m ?To help you gain control, practice the Urge Drill below when urgency strikes.  This drill will help retrain your bladder signals and allow you to store and hold urine longer.  The overall goal is to stretch out your time between voids to reach a more manageable voiding schedule.    ?Practice your "quick flicks" often throughout the day (each waking hour) even when you don't need feel the urge to go.  This will help strengthen your pelvic floor muscles, making them more effective in controlling leakage. ? ?Urge  Drill ? ?When you feel an urge to go, follow these steps to regain control: ?Stop what you are doing and be still ?Take one deep breath, directing your air into your abdomen ?Think an affirming thought, such as ?I've got this.? ?Do 5 quick flicks of your pelvic floor ?Walk with control to the bathroom to void,  or delay voiding ?  ?

## 2021-04-28 NOTE — Assessment & Plan Note (Signed)
12/08/2020:21-week pregnancy: MRI at [place] noncontrast revealed right axillary mass 3.7 cm, subpectoral lymph nodes, left supraclavicular lymph node, multiple level 2 and 3 lymph nodes in the right axilla, 2 breast masses 1.1 cm largest size. 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 ?Treatment plan: ?1. ?Neoadjuvant chemotherapy with Adriamycin and Cytoxan every 3 weeks x4?started 12/24/2020 completed 02/23/2021 ?2. delivery of the baby ?3. ?Continue neoadjuvant therapy with Taxol Herceptin and Perjeta ?4. ?Mastectomy with ALND versus breast conserving surgery if she has a dramatic response ?5. ?Adjuvant radiation therapy ?6. 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  She had 1 episode of diarrhea. ?Leukopenia: ANC 2.0 (we will reduce the dosage of Taxol to 50 mg per metered squared) ?? ?Return to clinic weekly for chemo and every other week for follow-up with [place]. ?

## 2021-04-28 NOTE — Patient Instructions (Signed)
[facility]  Discharge Instructions: ?Thank you for choosing [facility] to provide your oncology and hematology care.  ? ?If you have a lab appointment with the [facility], please go directly to the [facility] and check in at the registration area. ?  ?Wear comfortable clothing and clothing appropriate for easy access to any Portacath or PICC line.  ? ?We strive to give you quality time with your provider. You may need to reschedule your appointment if you arrive late (15 or more minutes).  Arriving late affects you and other patients whose appointments are after yours.  Also, if you miss three or more appointments without notifying the office, you may be dismissed from the clinic at the provider?s discretion.    ?  ?For prescription refill requests, have your pharmacy contact our office and allow 72 hours for refills to be completed.   ? ?Today you received the following chemotherapy and/or immunotherapy agents: Paclitaxel    ?  ?To help prevent nausea and vomiting after your treatment, we encourage you to take your nausea medication as directed. ? ?BELOW ARE SYMPTOMS THAT SHOULD BE REPORTED IMMEDIATELY: ?*FEVER GREATER THAN 100.4 F (38 ?C) OR HIGHER ?*CHILLS OR SWEATING ?*NAUSEA AND VOMITING THAT IS NOT CONTROLLED WITH YOUR NAUSEA MEDICATION ?*UNUSUAL SHORTNESS OF BREATH ?*UNUSUAL BRUISING OR BLEEDING ?*URINARY PROBLEMS (pain or burning when urinating, or frequent urination) ?*BOWEL PROBLEMS (unusual diarrhea, constipation, pain near the anus) ?TENDERNESS IN MOUTH AND THROAT WITH OR WITHOUT PRESENCE OF ULCERS (sore throat, sores in mouth, or a toothache) ?UNUSUAL RASH, SWELLING OR PAIN  ?UNUSUAL VAGINAL DISCHARGE OR ITCHING  ? ?Items with * indicate a potential emergency and should be followed up as soon as possible or go to the Emergency Department if any problems should occur. ? ?Please show the CHEMOTHERAPY ALERT CARD or IMMUNOTHERAPY ALERT CARD at check-in to  the Emergency Department and triage nurse. ? ?Should you have questions after your visit or need to cancel or reschedule your appointment, please contact [facility]  Dept: [phone]  and follow the prompts.  Office hours are 8:00 a.m. to 4:30 p.m. Monday - Friday. Please note that voicemails left after 4:00 p.m. may not be returned until the following business day.  We are closed weekends and major holidays. You have access to a nurse at all times for urgent questions. Please call the main number to the clinic Dept: [phone] and follow the prompts. ? ? ?For any non-urgent questions, you may also contact your provider using MyChart. We now offer e-Visits for anyone 63 and older to request care online for non-urgent symptoms. For details visit [email or web address]. ?  ?Also download the MyChart app! Go to the app store, search "MyChart", open the app, select [facility], and log in with your MyChart username and password. ? ?Due to Covid, a mask is required upon entering the hospital/clinic. If you do not have a mask, one will be given to you upon arrival. For doctor visits, patients may have 1 support person aged 67 or older with them. For treatment visits, patients cannot have anyone with them due to current Covid guidelines and our immunocompromised population.  ? ?

## 2021-04-29 ENCOUNTER — Telehealth: Payer: Self-pay

## 2021-04-29 ENCOUNTER — Encounter: Payer: Self-pay | Admitting: *Deleted

## 2021-04-29 NOTE — Telephone Encounter (Signed)
Notified Patient of completion of Form 703. Fax transmission confirmation received. Copy placed in bin at Registration #1 for pick-up as requested by Patient. No other needs or concerns voiced at this time. ?

## 2021-05-04 ENCOUNTER — Ambulatory Visit: Payer: BC Managed Care – PPO | Admitting: Physical Therapy

## 2021-05-04 DIAGNOSIS — R293 Abnormal posture: Secondary | ICD-10-CM

## 2021-05-04 DIAGNOSIS — M6281 Muscle weakness (generalized): Secondary | ICD-10-CM

## 2021-05-04 DIAGNOSIS — R279 Unspecified lack of coordination: Secondary | ICD-10-CM

## 2021-05-04 DIAGNOSIS — N3941 Urge incontinence: Secondary | ICD-10-CM | POA: Diagnosis not present

## 2021-05-04 NOTE — Therapy (Signed)
?OUTPATIENT PHYSICAL THERAPY TREATMENT NOTE ? ? ?Patient Name: Evelyn Marshall ?MRN: [id number] ?DOB:07/15/1985, 36 y.o., female ?Today's Date: 05/04/2021 ? ?PCP: [name], MD ?REFERRING PROVIDER: Wilber Bihari Cornett* ? ?END OF SESSION:  ? PT End of Session - 05/04/21 1536   ? ? Visit Number 2   ? Date for PT Re-Evaluation 07/28/21   ? Authorization Type BCBS   ? PT Start Time 1532   ? PT Stop Time 1610   ? PT Time Calculation (min) 38 min   ? Activity Tolerance Patient tolerated treatment well   ? Behavior During Therapy [facility] for tasks assessed/performed   ? ?  ?  ? ?  ? ? ?Past Medical History:  ?Diagnosis Date  ? Abnormal Pap smear 2012  ? colpo result benign  ? Cancer [facility])   ? Chlamydia   ? Family history of breast cancer 12/15/2020  ? History of pre-eclampsia   ? HPV (human papilloma virus) infection   ? Hyperemesis   ? Hypokalemia   ? Pregnancy induced hypertension   ? Ptyalism   ? Vaginal Pap smear, abnormal   ? ?Past Surgical History:  ?Procedure Laterality Date  ? COLPOSCOPY    ? IR IMAGING GUIDED PORT INSERTION  12/24/2020  ? WISDOM TOOTH EXTRACTION    ? ?Patient Active Problem List  ? Diagnosis Date Noted  ? Postpartum care following vaginal delivery 03/16/2021  ? Malignant neoplasm affecting pregnancy in third trimester 03/15/2021  ? Malignant neoplasm affecting pregnancy, antepartum, third trimester 03/13/2021  ? Port-A-Cath in place 12/31/2020  ? Genetic testing 12/27/2020  ? Malignant neoplasm of upper-outer quadrant of right breast in female, estrogen receptor positive ([place]) 12/15/2020  ? Family history of breast cancer 12/15/2020  ? Severe preeclampsia, third trimester 02/25/2019  ? Hyperemesis gravidarum 08/20/2018  ? Preeclampsia, third trimester 11/15/2015  ? Pre-eclampsia during pregnancy in third trimester, antepartum 11/15/2015  ? Preeclampsia 11/15/2015  ? Mild preeclampsia 11/12/2015  ? Hypokalemia, inadequate intake 06/17/2015  ? Ptyalism 05/18/2015  ? Chronic hypertension in pregnancy  05/18/2015  ? Morbid obesity with BMI of 40.0-44.9, adult ([place]) 05/18/2015  ? Rh negative, maternal 05/18/2015  ? ? ?REFERRING DIAG: O9A.113 (ICD-10-CM) - Malignant neoplasm affecting pregnancy, antepartum, third trimester ?C50.411,Z17.0 (ICD-10-CM) - Malignant neoplasm of upper-outer quadrant of ? ?THERAPY DIAG:  ?Muscle weakness (generalized) ? ?Unspecified lack of coordination ? ?Abnormal posture ? ?PERTINENT HISTORY: Malignant neoplasm of upper-outer quadrant of right breast in female, estrogen receptor positive ([place]) ?Staging form: Breast, AJCC 8th Edition ?- Clinical: Stage IV (cT3, cN2, pM1, G2, ER+, PR-, HER2+) - Signed by Nicholas Lose, MD on 12/15/2020 ?Initial Diagnosis 12/08/20; staging 12/15/20; chemo Adjuvant AC q21d 122/22-02/23/21; genetic testing 12/29/20; chemo Patient is on Treatment Plan : BREAST AC q21 days / Trastuzumab + Pertuzumab + Weekly Paclitaxel q21d   03/25/21-present  ?Sexual abuse: No ? ?PRECAUTIONS: Other: postpartum (fourth baby 03/16/21) ? ?SUBJECTIVE: Pt reports she has been trying to do knack method and HEP and has bene helpful but does still have leakage with certain tasks. Notes more leakage in evening with sneezing.  ? ?PAIN:  ?Are you having pain? No ? ? ? ? ? ?OBJECTIVE:  ?  ?DIAGNOSTIC FINDINGS:  ?  ?  ?COGNITION: ?           Overall cognitive status: Within functional limits for tasks assessed              ?            ?  SENSATION: ?           Light touch: Appears intact ?           Proprioception: Appears intact ?  ?MUSCLE LENGTH: ?Hamstrings and adductors limited by 25% bil ?  ?  ?  ?  ?POSTURE:  ?Rounded shoulders, posterior pelvic tilt ?  ?LUMBARAROM/PROM ?  ?Bil side bending and rotation limited by 25% all others WFL ?  ?LE ROM: ?Bil LE WFL ?  ?LE MMT: ?  ?Bil hip abduction 3+/5 all others 4/5 ?  ?PELVIC MMT: deferred per pt request 05/04/2021  ?  ?MMT   ?04/28/2021  ?Vaginal    ?Internal Anal Sphincter    ?External Anal Sphincter    ?Puborectalis    ?Diastasis Recti No  separation felt this date at rest or with shoulders lifted from table however pt did demonstrate difficulty with maintain a minimal crunch position   ?(Blank rows = not tested) ?  ?      PALPATION: ?  General  no TTP throughout but mild restrictions noted in bil lower abdominal region ?  ?              External Perineal Exam deferred ?              ?              Internal Pelvic Floor deferred ?  ?TONE: ?deferred ?  ?PROLAPSE: ?deferred ?  ?TODAY'S TREATMENT  ?05/04/2021: ?X20 diaphragmatic breathing in supine ?Ball squeezes 2x10 with exhale ?Sidelying hip abduction with ball press 2x10 ? Quad TA activations 2x10 ?Seated bil shoulder horizontal abductions red band 2x10 with TA activation exhale ?Sit to stand with exhale and pelvic floor contract/relax ?Nustep 5 mins L2 for improved tolerance to activity core activation  and bil leg strengthening ? ?EVAL 04/28/2021: Examination completed, findings reviewed, pt educated on POC, HEP, urge drill, knack method Pt motivated to participate in PT and agreeable to attempt recommendations.  ?  ?  ?  ?PATIENT EDUCATION:  ?Education details: [id number]' H, urge drill, knack method, natural moisturizers without hormones or chemicals  ?Person educated: Patient ?Education method: Explanation, Demonstration, Tactile cues, Verbal cues, and Handouts ?Education comprehension: verbalized understanding and returned demonstration ?  ?  ?HOME EXERCISE PROGRAM: ?[id number]' H ?  ?ASSESSMENT: ?  ?CLINICAL IMPRESSION: ?Patient reports she has been seeing some improvement with leakage but still having some. Pt session for core,hips strengthening with coordination for pelvic floor and breathing for improvement with moderate cues to complete. Pt does continue to have difficulty with core and hip weakness, decreased tolerance to activity and benefits from cues for improved technique and coordination of exercises. Pt denied any leakage with exercises today. Pt would benefit from additional PT to further  address deficits.   ?  ?  ?OBJECTIVE IMPAIRMENTS decreased coordination, decreased endurance, decreased mobility, decreased strength, increased fascial restrictions, impaired flexibility, improper body mechanics, and postural dysfunction.  ?  ?ACTIVITY LIMITATIONS community activity and yard work.  ?  ?PERSONAL FACTORS Past/current experiences, Time since onset of injury/illness/exacerbation, and 3+ comorbidities: x4 vaginal births, currently undergoing CA treatments for breast CA  are also affecting patient's functional outcome.  ?  ?  ?REHAB POTENTIAL: Good ?  ?CLINICAL DECISION MAKING: Unstable/unpredictable ?  ?EVALUATION COMPLEXITY: High ?  ?  ?GOALS: ?Goals reviewed with patient? Yes ?  ?SHORT TERM GOALS: Target date: 05/26/2021 ?  ?Pt to be I with HEP. ?Baseline: ?Goal status: INITIAL ?  ?2.  Pt to  demonstrate at least 3/5 pelvic floor strength for improved ability to hold urine for decreased leakage.  ?Baseline:  ?Goal status: INITIAL ?  ?3.  Pt to report decrease in leakage to no more than once per day. ?Baseline:  ?Goal status: INITIAL ?  ?4.  Pt to recall improved voiding mechanics for decreased need for straining for bowel movements without cues I.  ?Baseline:  ?Goal status: INITIAL ?  ?LONG TERM GOALS: Target date: 07/28/2021 ?  ?Pt to be I with advanced HEP ?Baseline:  ?Goal status: INITIAL ?  ?2.  Pt to demonstrate at least 5/5 bil hip strength for improved pelvic stability and functional squats without leakage. ?  ?Baseline:  ?Goal status: INITIAL ?  ?3.  Pt to demonstrate at least 4/5 pelvic floor strength for improved ability to hold urine for decreased leakage.  ?Baseline:  ?Goal status: INITIAL ?  ?4.   Pt to report decrease in leakage to no more than once per week for improved symptoms. ?Baseline:  ?Goal status: INITIAL ?  ?5.  Pt to tolerate activity such as walking for 30 mins without leakage for improved mobility and decreased symptoms.  ?Baseline:  ?Goal status: INITIAL ?  ?6.  Pt to demonstrate  improved coordination of breathing mechanics, core activation and pelvic floor mobility with functional tasks to decrease leakage symptoms at least 75% of the time.  ?Baseline:  ?Goal status: INITIAL ?  ?PLAN: ?PT

## 2021-05-05 ENCOUNTER — Other Ambulatory Visit: Payer: Self-pay

## 2021-05-05 ENCOUNTER — Inpatient Hospital Stay: Payer: BC Managed Care – PPO

## 2021-05-05 VITALS — BP 113/70 | HR 82 | Temp 98.9°F | Resp 18 | Wt 277.5 lb

## 2021-05-05 DIAGNOSIS — Z170 Estrogen receptor positive status [ER+]: Secondary | ICD-10-CM

## 2021-05-05 DIAGNOSIS — Z5112 Encounter for antineoplastic immunotherapy: Secondary | ICD-10-CM | POA: Diagnosis not present

## 2021-05-05 DIAGNOSIS — Z95828 Presence of other vascular implants and grafts: Secondary | ICD-10-CM

## 2021-05-05 LAB — CMP (CANCER CENTER ONLY)
ALT: 36 U/L (ref 0–44)
AST: 25 U/L (ref 15–41)
Albumin: 4.1 g/dL (ref 3.5–5.0)
Alkaline Phosphatase: 103 U/L (ref 38–126)
Anion gap: 6 (ref 5–15)
BUN: 13 mg/dL (ref 6–20)
CO2: 28 mmol/L (ref 22–32)
Calcium: 9.5 mg/dL (ref 8.9–10.3)
Chloride: 107 mmol/L (ref 98–111)
Creatinine: 0.84 mg/dL (ref 0.44–1.00)
GFR, Estimated: >60 mL/min (ref >60)
Glucose, Bld: 92 mg/dL (ref 70–99)
Potassium: 3.6 mmol/L (ref 3.5–5.1)
Sodium: 141 mmol/L (ref 135–145)
Total Bilirubin: 0.4 mg/dL (ref 0.3–1.2)
Total Protein: 7.6 g/dL (ref 6.5–8.1)

## 2021-05-05 LAB — CBC WITH DIFFERENTIAL (CANCER CENTER ONLY)
Abs Immature Granulocytes: 0.03 10*3/uL (ref 0.00–0.07)
Basophils Absolute: 0.1 10*3/uL (ref 0.0–0.1)
Basophils Relative: 1 %
Eosinophils Absolute: 0 10*3/uL (ref 0.0–0.5)
Eosinophils Relative: 1 %
HCT: 32.1 % — ABNORMAL LOW (ref 36.0–46.0)
Hemoglobin: 10.5 g/dL — ABNORMAL LOW (ref 12.0–15.0)
Immature Granulocytes: 1 %
Lymphocytes Relative: 13 %
Lymphs Abs: 0.5 10*3/uL — ABNORMAL LOW (ref 0.7–4.0)
MCH: 26.3 pg (ref 26.0–34.0)
MCHC: 32.7 g/dL (ref 30.0–36.0)
MCV: 80.5 fL (ref 80.0–100.0)
Monocytes Absolute: 0.4 10*3/uL (ref 0.1–1.0)
Monocytes Relative: 12 %
Neutro Abs: 2.6 10*3/uL (ref 1.7–7.7)
Neutrophils Relative %: 72 %
Platelet Count: 377 10*3/uL (ref 150–400)
RBC: 3.99 MIL/uL (ref 3.87–5.11)
RDW: 18.7 % — ABNORMAL HIGH (ref 11.5–15.5)
WBC Count: 3.6 10*3/uL — ABNORMAL LOW (ref 4.0–10.5)
nRBC: 0 % (ref 0.0–0.2)

## 2021-05-05 MED ORDER — ACETAMINOPHEN 325 MG PO TABS
650.0000 mg | ORAL_TABLET | Freq: Once | ORAL | Status: AC
  Administered 2021-05-05: 650 mg via ORAL
  Filled 2021-05-05: qty 2

## 2021-05-05 MED ORDER — HEPARIN SOD (PORK) LOCK FLUSH 100 UNIT/ML IV SOLN
500.0000 [IU] | Freq: Once | INTRAVENOUS | Status: AC | PRN
  Administered 2021-05-05: 500 [IU]

## 2021-05-05 MED ORDER — SODIUM CHLORIDE 0.9 % IV SOLN: Freq: Once | INTRAVENOUS | Status: AC

## 2021-05-05 MED ORDER — SODIUM CHLORIDE 0.9 % IV SOLN
50.0000 mg/m2 | Freq: Once | INTRAVENOUS | Status: AC
  Administered 2021-05-05: 126 mg via INTRAVENOUS
  Filled 2021-05-05: qty 21

## 2021-05-05 MED ORDER — DIPHENHYDRAMINE HCL 50 MG/ML IJ SOLN
25.0000 mg | Freq: Once | INTRAMUSCULAR | Status: AC
  Administered 2021-05-05: 25 mg via INTRAVENOUS
  Filled 2021-05-05: qty 1

## 2021-05-05 MED ORDER — SODIUM CHLORIDE 0.9 % IV SOLN
420.0000 mg | Freq: Once | INTRAVENOUS | Status: AC
  Administered 2021-05-05: 420 mg via INTRAVENOUS
  Filled 2021-05-05: qty 14

## 2021-05-05 MED ORDER — SODIUM CHLORIDE 0.9% FLUSH
10.0000 mL | INTRAVENOUS | Status: DC | PRN
  Administered 2021-05-05: 10 mL

## 2021-05-05 MED ORDER — TRASTUZUMAB-DKST CHEMO 150 MG IV SOLR
6.0000 mg/kg | Freq: Once | INTRAVENOUS | Status: AC
  Administered 2021-05-05: 798 mg via INTRAVENOUS
  Filled 2021-05-05: qty 38

## 2021-05-05 MED ORDER — SODIUM CHLORIDE 0.9% FLUSH
10.0000 mL | Freq: Once | INTRAVENOUS | Status: AC | PRN
  Administered 2021-05-05: 10 mL

## 2021-05-05 MED ORDER — FAMOTIDINE IN NACL 20-0.9 MG/50ML-% IV SOLN
20.0000 mg | Freq: Once | INTRAVENOUS | Status: AC
  Administered 2021-05-05: 20 mg via INTRAVENOUS
  Filled 2021-05-05: qty 50

## 2021-05-05 MED ORDER — SODIUM CHLORIDE 0.9 % IV SOLN
10.0000 mg | Freq: Once | INTRAVENOUS | Status: AC
  Administered 2021-05-05: 10 mg via INTRAVENOUS
  Filled 2021-05-05: qty 10

## 2021-05-11 ENCOUNTER — Ambulatory Visit: Payer: BC Managed Care – PPO | Admitting: Physical Therapy

## 2021-05-11 DIAGNOSIS — R279 Unspecified lack of coordination: Secondary | ICD-10-CM

## 2021-05-11 DIAGNOSIS — N3941 Urge incontinence: Secondary | ICD-10-CM | POA: Diagnosis not present

## 2021-05-11 DIAGNOSIS — R293 Abnormal posture: Secondary | ICD-10-CM

## 2021-05-11 DIAGNOSIS — M6281 Muscle weakness (generalized): Secondary | ICD-10-CM

## 2021-05-11 NOTE — Therapy (Signed)
?OUTPATIENT PHYSICAL THERAPY TREATMENT NOTE ? ? ?Patient Name: Evelyn Marshall ?MRN: [id number] ?DOB:31-Dec-1985, 36 y.o., female ?Today's Date: 05/11/2021 ? ?PCP: [name], MD ?REFERRING PROVIDER: [name], MD ? ?END OF SESSION:  ? PT End of Session - 05/11/21 1412   ? ? Visit Number 3   ? Date for PT Re-Evaluation 07/28/21   ? Authorization Type BCBS   ? PT Start Time 1411   pt arrival time  ? PT Stop Time 2774   ? PT Time Calculation (min) 31 min   ? Activity Tolerance Patient tolerated treatment well   ? Behavior During Therapy [facility] for tasks assessed/performed   ? ?  ?  ? ?  ? ? ?Past Medical History:  ?Diagnosis Date  ? Abnormal Pap smear 2012  ? colpo result benign  ? Cancer [facility])   ? Chlamydia   ? Family history of breast cancer 12/15/2020  ? History of pre-eclampsia   ? HPV (human papilloma virus) infection   ? Hyperemesis   ? Hypokalemia   ? Pregnancy induced hypertension   ? Ptyalism   ? Vaginal Pap smear, abnormal   ? ?Past Surgical History:  ?Procedure Laterality Date  ? COLPOSCOPY    ? IR IMAGING GUIDED PORT INSERTION  12/24/2020  ? WISDOM TOOTH EXTRACTION    ? ?Patient Active Problem List  ? Diagnosis Date Noted  ? Postpartum care following vaginal delivery 03/16/2021  ? Malignant neoplasm affecting pregnancy in third trimester 03/15/2021  ? Malignant neoplasm affecting pregnancy, antepartum, third trimester 03/13/2021  ? Port-A-Cath in place 12/31/2020  ? Genetic testing 12/27/2020  ? Malignant neoplasm of upper-outer quadrant of right breast in female, estrogen receptor positive ([place]) 12/15/2020  ? Family history of breast cancer 12/15/2020  ? Severe preeclampsia, third trimester 02/25/2019  ? Hyperemesis gravidarum 08/20/2018  ? Preeclampsia, third trimester 11/15/2015  ? Pre-eclampsia during pregnancy in third trimester, antepartum 11/15/2015  ? Preeclampsia 11/15/2015  ? Mild preeclampsia 11/12/2015  ? Hypokalemia, inadequate intake 06/17/2015  ? Ptyalism 05/18/2015  ? Chronic hypertension in  pregnancy 05/18/2015  ? Morbid obesity with BMI of 40.0-44.9, adult ([place]) 05/18/2015  ? Rh negative, maternal 05/18/2015  ? ? ?REFERRING DIAG: O9A.113 (ICD-10-CM) - Malignant neoplasm affecting pregnancy, antepartum, third trimester ?C50.411,Z17.0 (ICD-10-CM) - Malignant neoplasm of upper-outer quadrant of ? ?THERAPY DIAG:  ?Muscle weakness (generalized) ? ?Unspecified lack of coordination ? ?Abnormal posture ? ?PERTINENT HISTORY: Malignant neoplasm of upper-outer quadrant of right breast in female, estrogen receptor positive ([place]) ?Staging form: Breast, AJCC 8th Edition ?- Clinical: Stage IV (cT3, cN2, pM1, G2, ER+, PR-, HER2+) - Signed by Nicholas Lose, MD on 12/15/2020 ?Initial Diagnosis 12/08/20; staging 12/15/20; chemo Adjuvant AC q21d 122/22-02/23/21; genetic testing 12/29/20; chemo Patient is on Treatment Plan : BREAST AC q21 days / Trastuzumab + Pertuzumab + Weekly Paclitaxel q21d   03/25/21-present  ?Sexual abuse: No ? ?PRECAUTIONS: Other: postpartum (fourth baby 03/16/21) ? ?SUBJECTIVE: Pt reports she had no leakage this past week , has been doing breathing the pelvic floor coordination and strengthening exercise.  ? ?PAIN:  ?Are you having pain? No ? ? ? ?OBJECTIVE:  ?  ?DIAGNOSTIC FINDINGS:  ?  ?  ?COGNITION: ?           Overall cognitive status: Within functional limits for tasks assessed              ?            ?SENSATION: ?  Light touch: Appears intact ?           Proprioception: Appears intact ?  ?MUSCLE LENGTH: ?Hamstrings and adductors limited by 25% bil ?  ?  ?  ?  ?POSTURE:  ?Rounded shoulders, posterior pelvic tilt ?  ?LUMBARAROM/PROM ?  ?Bil side bending and rotation limited by 25% all others WFL ?  ?LE ROM: ?Bil LE WFL ?  ?LE MMT: ?  ?Bil hip abduction 3+/5 all others 4/5 ?  ?PELVIC MMT: deferred per pt request 05/04/2021  ?  ?MMT   ?04/28/2021  ?Vaginal    ?Internal Anal Sphincter    ?External Anal Sphincter    ?Puborectalis    ?Diastasis Recti No separation felt this date at rest or with  shoulders lifted from table however pt did demonstrate difficulty with maintain a minimal crunch position   ?(Blank rows = not tested) ?  ?      PALPATION: ?  General  no TTP throughout but mild restrictions noted in bil lower abdominal region ?  ?              External Perineal Exam deferred ?              ?              Internal Pelvic Floor deferred ?  ?TONE: ?deferred ?  ?PROLAPSE: ?deferred ?  ?TODAY'S TREATMENT  ? ?05/11/2021: ?Nustep 5 mins L3  ?Bridges with ball squeezes 2x10 ?Bird dog x10 ?Opp arm/hand press x10 each ?X10 Sit to stand 15# ?Squats x10 15# ?300' farmer's carry 10# and 15# cues needed for midline stance to decreased rt side lean ?Bosu unilaterally x15 squats,  x10 forward weight shifts with lead leg on bosu ? ?05/04/2021: ?X20 diaphragmatic breathing in supine ?Ball squeezes 2x10 with exhale ?Sidelying hip abduction with ball press 2x10 ? Quad TA activations 2x10 ?Seated bil shoulder horizontal abductions red band 2x10 with TA activation exhale ?Sit to stand with exhale and pelvic floor contract/relax ?Nustep 5 mins L2 for improved tolerance to activity core activation  and bil leg strengthening ? ?EVAL 04/28/2021: Examination completed, findings reviewed, pt educated on POC, HEP, urge drill, knack method Pt motivated to participate in PT and agreeable to attempt recommendations.  ?  ?  ?  ?PATIENT EDUCATION:  ?Education details: ZB2M'7HZ' H, urge drill, knack method, natural moisturizers without hormones or chemicals  ?Person educated: Patient ?Education method: Explanation, Demonstration, Tactile cues, Verbal cues, and Handouts ?Education comprehension: verbalized understanding and returned demonstration ?  ?  ?HOME EXERCISE PROGRAM: ?ZB2M'7HZ' H ?  ?ASSESSMENT: ?  ?CLINICAL IMPRESSION: ?Patient reports she continues to see improvement and has no leakage since last session. Pt session focused on hip and core strengthening with coordination of pelvic floor with all tasks for decreased leakage. Pt would  benefit from additional PT to further address deficits.   ?  ?  ?OBJECTIVE IMPAIRMENTS decreased coordination, decreased endurance, decreased mobility, decreased strength, increased fascial restrictions, impaired flexibility, improper body mechanics, and postural dysfunction.  ?  ?ACTIVITY LIMITATIONS community activity and yard work.  ?  ?PERSONAL FACTORS Past/current experiences, Time since onset of injury/illness/exacerbation, and 3+ comorbidities: x4 vaginal births, currently undergoing CA treatments for breast CA  are also affecting patient's functional outcome.  ?  ?  ?REHAB POTENTIAL: Good ?  ?CLINICAL DECISION MAKING: Unstable/unpredictable ?  ?EVALUATION COMPLEXITY: High ?  ?  ?GOALS: ?Goals reviewed with patient? Yes ?  ?SHORT TERM GOALS: Target date: 05/26/2021 ?  ?Pt to  be I with HEP. ?Baseline: ?Goal status: INITIAL ?  ?2.  Pt to demonstrate at least 3/5 pelvic floor strength for improved ability to hold urine for decreased leakage.  ?Baseline:  ?Goal status: INITIAL ?  ?3.  Pt to report decrease in leakage to no more than once per day. ?Baseline:  ?Goal status: INITIAL ?  ?4.  Pt to recall improved voiding mechanics for decreased need for straining for bowel movements without cues I.  ?Baseline:  ?Goal status: INITIAL ?  ?LONG TERM GOALS: Target date: 07/28/2021 ?  ?Pt to be I with advanced HEP ?Baseline:  ?Goal status: INITIAL ?  ?2.  Pt to demonstrate at least 5/5 bil hip strength for improved pelvic stability and functional squats without leakage. ?  ?Baseline:  ?Goal status: INITIAL ?  ?3.  Pt to demonstrate at least 4/5 pelvic floor strength for improved ability to hold urine for decreased leakage.  ?Baseline:  ?Goal status: INITIAL ?  ?4.   Pt to report decrease in leakage to no more than once per week for improved symptoms. ?Baseline:  ?Goal status: INITIAL ?  ?5.  Pt to tolerate activity such as walking for 30 mins without leakage for improved mobility and decreased symptoms.  ?Baseline:  ?Goal  status: INITIAL ?  ?6.  Pt to demonstrate improved coordination of breathing mechanics, core activation and pelvic floor mobility with functional tasks to decrease leakage symptoms at least 75% of the time.

## 2021-05-12 ENCOUNTER — Inpatient Hospital Stay: Payer: BC Managed Care – PPO

## 2021-05-12 ENCOUNTER — Encounter: Payer: Self-pay | Admitting: *Deleted

## 2021-05-12 ENCOUNTER — Inpatient Hospital Stay (HOSPITAL_BASED_OUTPATIENT_CLINIC_OR_DEPARTMENT_OTHER): Payer: BC Managed Care – PPO | Admitting: Hematology and Oncology

## 2021-05-12 VITALS — BP 149/91 | HR 89 | Temp 97.2°F | Resp 18 | Ht 64.0 in | Wt 277.4 lb

## 2021-05-12 DIAGNOSIS — Z170 Estrogen receptor positive status [ER+]: Secondary | ICD-10-CM

## 2021-05-12 DIAGNOSIS — Z95828 Presence of other vascular implants and grafts: Secondary | ICD-10-CM

## 2021-05-12 DIAGNOSIS — C50411 Malignant neoplasm of upper-outer quadrant of right female breast: Secondary | ICD-10-CM

## 2021-05-12 DIAGNOSIS — Z5112 Encounter for antineoplastic immunotherapy: Secondary | ICD-10-CM | POA: Diagnosis not present

## 2021-05-12 LAB — CMP (CANCER CENTER ONLY)
ALT: 35 U/L (ref 0–44)
AST: 24 U/L (ref 15–41)
Albumin: 4 g/dL (ref 3.5–5.0)
Alkaline Phosphatase: 100 U/L (ref 38–126)
Anion gap: 7 (ref 5–15)
BUN: 12 mg/dL (ref 6–20)
CO2: 27 mmol/L (ref 22–32)
Calcium: 9.4 mg/dL (ref 8.9–10.3)
Chloride: 106 mmol/L (ref 98–111)
Creatinine: 0.84 mg/dL (ref 0.44–1.00)
GFR, Estimated: >60 mL/min (ref >60)
Glucose, Bld: 103 mg/dL — ABNORMAL HIGH (ref 70–99)
Potassium: 3.5 mmol/L (ref 3.5–5.1)
Sodium: 140 mmol/L (ref 135–145)
Total Bilirubin: 0.4 mg/dL (ref 0.3–1.2)
Total Protein: 7.6 g/dL (ref 6.5–8.1)

## 2021-05-12 LAB — CBC WITH DIFFERENTIAL (CANCER CENTER ONLY)
Abs Immature Granulocytes: 0.02 10*3/uL (ref 0.00–0.07)
Basophils Absolute: 0 10*3/uL (ref 0.0–0.1)
Basophils Relative: 1 %
Eosinophils Absolute: 0 10*3/uL (ref 0.0–0.5)
Eosinophils Relative: 1 %
HCT: 31.5 % — ABNORMAL LOW (ref 36.0–46.0)
Hemoglobin: 10.3 g/dL — ABNORMAL LOW (ref 12.0–15.0)
Immature Granulocytes: 1 %
Lymphocytes Relative: 15 %
Lymphs Abs: 0.5 10*3/uL — ABNORMAL LOW (ref 0.7–4.0)
MCH: 26.3 pg (ref 26.0–34.0)
MCHC: 32.7 g/dL (ref 30.0–36.0)
MCV: 80.6 fL (ref 80.0–100.0)
Monocytes Absolute: 0.3 10*3/uL (ref 0.1–1.0)
Monocytes Relative: 10 %
Neutro Abs: 2.5 10*3/uL (ref 1.7–7.7)
Neutrophils Relative %: 72 %
Platelet Count: 372 10*3/uL (ref 150–400)
RBC: 3.91 MIL/uL (ref 3.87–5.11)
RDW: 18.2 % — ABNORMAL HIGH (ref 11.5–15.5)
WBC Count: 3.4 10*3/uL — ABNORMAL LOW (ref 4.0–10.5)
nRBC: 0 % (ref 0.0–0.2)

## 2021-05-12 MED ORDER — DIPHENHYDRAMINE HCL 50 MG/ML IJ SOLN
25.0000 mg | Freq: Once | INTRAMUSCULAR | Status: AC
  Administered 2021-05-12: 25 mg via INTRAVENOUS
  Filled 2021-05-12: qty 1

## 2021-05-12 MED ORDER — SODIUM CHLORIDE 0.9 % IV SOLN: Freq: Once | INTRAVENOUS | Status: AC

## 2021-05-12 MED ORDER — FAMOTIDINE IN NACL 20-0.9 MG/50ML-% IV SOLN
20.0000 mg | Freq: Once | INTRAVENOUS | Status: AC
  Administered 2021-05-12: 20 mg via INTRAVENOUS
  Filled 2021-05-12: qty 50

## 2021-05-12 MED ORDER — SODIUM CHLORIDE 0.9 % IV SOLN
10.0000 mg | Freq: Once | INTRAVENOUS | Status: AC
  Administered 2021-05-12: 10 mg via INTRAVENOUS
  Filled 2021-05-12: qty 10

## 2021-05-12 MED ORDER — SODIUM CHLORIDE 0.9% FLUSH
10.0000 mL | Freq: Once | INTRAVENOUS | Status: AC
  Administered 2021-05-12: 10 mL

## 2021-05-12 MED ORDER — SODIUM CHLORIDE 0.9% FLUSH
10.0000 mL | INTRAVENOUS | Status: DC | PRN
  Administered 2021-05-12: 10 mL

## 2021-05-12 MED ORDER — SODIUM CHLORIDE 0.9 % IV SOLN
50.0000 mg/m2 | Freq: Once | INTRAVENOUS | Status: AC
  Administered 2021-05-12: 126 mg via INTRAVENOUS
  Filled 2021-05-12: qty 21

## 2021-05-12 MED ORDER — HEPARIN SOD (PORK) LOCK FLUSH 100 UNIT/ML IV SOLN
500.0000 [IU] | Freq: Once | INTRAVENOUS | Status: AC | PRN
  Administered 2021-05-12: 500 [IU]

## 2021-05-12 NOTE — Patient Instructions (Signed)
[facility]  Discharge Instructions: ?Thank you for choosing [facility] to provide your oncology and hematology care.  ? ?If you have a lab appointment with the [facility], please go directly to the [facility] and check in at the registration area. ?  ?Wear comfortable clothing and clothing appropriate for easy access to any Portacath or PICC line.  ? ?We strive to give you quality time with your provider. You may need to reschedule your appointment if you arrive late (15 or more minutes).  Arriving late affects you and other patients whose appointments are after yours.  Also, if you miss three or more appointments without notifying the office, you may be dismissed from the clinic at the provider?s discretion.    ?  ?For prescription refill requests, have your pharmacy contact our office and allow 72 hours for refills to be completed.   ? ?Today you received the following chemotherapy and/or immunotherapy agents: Paclitaxel    ?  ?To help prevent nausea and vomiting after your treatment, we encourage you to take your nausea medication as directed. ? ?BELOW ARE SYMPTOMS THAT SHOULD BE REPORTED IMMEDIATELY: ?*FEVER GREATER THAN 100.4 F (38 ?C) OR HIGHER ?*CHILLS OR SWEATING ?*NAUSEA AND VOMITING THAT IS NOT CONTROLLED WITH YOUR NAUSEA MEDICATION ?*UNUSUAL SHORTNESS OF BREATH ?*UNUSUAL BRUISING OR BLEEDING ?*URINARY PROBLEMS (pain or burning when urinating, or frequent urination) ?*BOWEL PROBLEMS (unusual diarrhea, constipation, pain near the anus) ?TENDERNESS IN MOUTH AND THROAT WITH OR WITHOUT PRESENCE OF ULCERS (sore throat, sores in mouth, or a toothache) ?UNUSUAL RASH, SWELLING OR PAIN  ?UNUSUAL VAGINAL DISCHARGE OR ITCHING  ? ?Items with * indicate a potential emergency and should be followed up as soon as possible or go to the Emergency Department if any problems should occur. ? ?Please show the CHEMOTHERAPY ALERT CARD or IMMUNOTHERAPY ALERT CARD at check-in to  the Emergency Department and triage nurse. ? ?Should you have questions after your visit or need to cancel or reschedule your appointment, please contact [facility]  Dept: [phone]  and follow the prompts.  Office hours are 8:00 a.m. to 4:30 p.m. Monday - Friday. Please note that voicemails left after 4:00 p.m. may not be returned until the following business day.  We are closed weekends and major holidays. You have access to a nurse at all times for urgent questions. Please call the main number to the clinic Dept: [phone] and follow the prompts. ? ? ?For any non-urgent questions, you may also contact your provider using MyChart. We now offer e-Visits for anyone 2 and older to request care online for non-urgent symptoms. For details visit [email or web address]. ?  ?Also download the MyChart app! Go to the app store, search "MyChart", open the app, select [facility], and log in with your MyChart username and password. ? ?Due to Covid, a mask is required upon entering the hospital/clinic. If you do not have a mask, one will be given to you upon arrival. For doctor visits, patients may have 1 support person aged 42 or older with them. For treatment visits, patients cannot have anyone with them due to current Covid guidelines and our immunocompromised population.  ? ?

## 2021-05-12 NOTE — Assessment & Plan Note (Addendum)
12/08/2020:21-week pregnancy: MRI at [facility] noncontrast revealed right axillary mass 3.7 cm, subpectoral lymph nodes, left supraclavicular lymph node, multiple level 2 and 3 lymph nodes in the right axilla, 2 breast masses 1.1 cm largest size. 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 ?Treatment plan: ?1. ?Neoadjuvant chemotherapy with Adriamycin and Cytoxan every 3 weeks x4?started 12/24/2020 completed 02/23/2021 ?2. delivery of the baby ?3. ?Continue neoadjuvant therapy with Taxol Herceptin and Perjeta ?4. ?Mastectomy with ALND versus breast conserving surgery if she has a dramatic response ?5. ?Adjuvant radiation therapy ?6. ?Adjuvant antiestrogen therapy with ovarian function suppression ?Genetic testing ?---------------------------------------------------------------------------------------------------------------------------------- ?Evelyn Marshall has delivered a healthy boy?([name]) ? ? ?Current treatment: Cycle 8 Taxol (every 3 week Herceptin Perjeta) ?She tolerated Herceptin and Perjeta extremely well. ?She had a few episodes of diarrhea which were controlled with Imodium ?Leukopenia: ANC 2.8 (now at a reduced dosage of Taxol 50 mg per metered square) ?? ?We will order a breast MRI to be done on 06/02/2021.  I sent a message to Dr. Donne Hazel to see her afterwards. ?Return to clinic weekly for chemo and every other week for follow-up with [place]. ?

## 2021-05-13 ENCOUNTER — Encounter: Payer: Self-pay | Admitting: Hematology and Oncology

## 2021-05-17 ENCOUNTER — Ambulatory Visit (HOSPITAL_BASED_OUTPATIENT_CLINIC_OR_DEPARTMENT_OTHER): Payer: BC Managed Care – PPO | Admitting: Cardiovascular Disease

## 2021-05-18 ENCOUNTER — Ambulatory Visit: Payer: BC Managed Care – PPO | Admitting: Physical Therapy

## 2021-05-18 DIAGNOSIS — N3941 Urge incontinence: Secondary | ICD-10-CM | POA: Diagnosis not present

## 2021-05-18 DIAGNOSIS — M6281 Muscle weakness (generalized): Secondary | ICD-10-CM

## 2021-05-18 DIAGNOSIS — R293 Abnormal posture: Secondary | ICD-10-CM

## 2021-05-18 NOTE — Therapy (Signed)
?OUTPATIENT PHYSICAL THERAPY TREATMENT NOTE ? ? ?Patient Name: Evelyn Marshall ?MRN: [id number] ?DOB:1985-10-19, 36 y.o., female ?Today's Date: 05/18/2021 ? ?PCP: [name], MD ?REFERRING PROVIDER: Wilber Bihari Cornett* ? ?END OF SESSION:  ? PT End of Session - 05/18/21 1140   ? ? Visit Number 4   ? Date for PT Re-Evaluation 07/28/21   ? Authorization Type BCBS   ? PT Start Time 1105   ? PT Stop Time 5176   ? PT Time Calculation (min) 38 min   ? Activity Tolerance Patient tolerated treatment well   ? Behavior During Therapy [facility] for tasks assessed/performed   ? ?  ?  ? ?  ? ? ? ?Past Medical History:  ?Diagnosis Date  ? Abnormal Pap smear 2012  ? colpo result benign  ? Cancer [facility])   ? Chlamydia   ? Family history of breast cancer 12/15/2020  ? History of pre-eclampsia   ? HPV (human papilloma virus) infection   ? Hyperemesis   ? Hypokalemia   ? Pregnancy induced hypertension   ? Ptyalism   ? Vaginal Pap smear, abnormal   ? ?Past Surgical History:  ?Procedure Laterality Date  ? COLPOSCOPY    ? IR IMAGING GUIDED PORT INSERTION  12/24/2020  ? WISDOM TOOTH EXTRACTION    ? ?Patient Active Problem List  ? Diagnosis Date Noted  ? Postpartum care following vaginal delivery 03/16/2021  ? Malignant neoplasm affecting pregnancy in third trimester 03/15/2021  ? Malignant neoplasm affecting pregnancy, antepartum, third trimester 03/13/2021  ? Port-A-Cath in place 12/31/2020  ? Genetic testing 12/27/2020  ? Malignant neoplasm of upper-outer quadrant of right breast in female, estrogen receptor positive ([place]) 12/15/2020  ? Family history of breast cancer 12/15/2020  ? Severe preeclampsia, third trimester 02/25/2019  ? Hyperemesis gravidarum 08/20/2018  ? Preeclampsia, third trimester 11/15/2015  ? Pre-eclampsia during pregnancy in third trimester, antepartum 11/15/2015  ? Preeclampsia 11/15/2015  ? Mild preeclampsia 11/12/2015  ? Hypokalemia, inadequate intake 06/17/2015  ? Ptyalism 05/18/2015  ? Chronic hypertension in pregnancy  05/18/2015  ? Morbid obesity with BMI of 40.0-44.9, adult ([place]) 05/18/2015  ? Rh negative, maternal 05/18/2015  ? ? ?REFERRING DIAG: O9A.113 (ICD-10-CM) - Malignant neoplasm affecting pregnancy, antepartum, third trimester ?C50.411,Z17.0 (ICD-10-CM) - Malignant neoplasm of upper-outer quadrant of ? ?THERAPY DIAG:  ?Muscle weakness (generalized) ? ?Abnormal posture ? ?PERTINENT HISTORY: Malignant neoplasm of upper-outer quadrant of right breast in female, estrogen receptor positive ([place]) ?Staging form: Breast, AJCC 8th Edition ?- Clinical: Stage IV (cT3, cN2, pM1, G2, ER+, PR-, HER2+) - Signed by Nicholas Lose, MD on 12/15/2020 ?Initial Diagnosis 12/08/20; staging 12/15/20; chemo Adjuvant AC q21d 122/22-02/23/21; genetic testing 12/29/20; chemo Patient is on Treatment Plan : BREAST AC q21 days / Trastuzumab + Pertuzumab + Weekly Paclitaxel q21d   03/25/21-present  ?Sexual abuse: No ? ?PRECAUTIONS: Other: postpartum (fourth baby 03/16/21) ? ?SUBJECTIVE: Pt reports she only had a small amount of leakage on Friday with a lot of sneezing. "It's getting better and less when I do it".  ? ?PAIN:  ?Are you having pain? No ? ? ? ?OBJECTIVE:  ?  ?DIAGNOSTIC FINDINGS:  ?  ?  ?COGNITION: ?           Overall cognitive status: Within functional limits for tasks assessed              ?            ?SENSATION: ?  Light touch: Appears intact ?           Proprioception: Appears intact ?  ?MUSCLE LENGTH: ?Hamstrings and adductors limited by 25% bil ?  ?  ?  ?  ?POSTURE:  ?Rounded shoulders, posterior pelvic tilt ?  ?LUMBARAROM/PROM ?  ?Bil side bending and rotation limited by 25% all others WFL ?  ?LE ROM: ?Bil LE WFL ?  ?LE MMT: ?  ?Bil hip abduction 3+/5 all others 4/5 ?  ?PELVIC MMT: deferred per pt request 05/04/2021  ?  ?MMT   ?04/28/2021  ?Vaginal    ?Internal Anal Sphincter    ?External Anal Sphincter    ?Puborectalis    ?Diastasis Recti No separation felt this date at rest or with shoulders lifted from table however pt did  demonstrate difficulty with maintain a minimal crunch position   ?(Blank rows = not tested) ?  ?      PALPATION: ?  General  no TTP throughout but mild restrictions noted in bil lower abdominal region ?  ?              External Perineal Exam deferred ?              ?              Internal Pelvic Floor deferred ?  ?TONE: ?deferred ?  ?PROLAPSE: ?deferred ?  ?TODAY'S TREATMENT  ? ?05/18/2021: ?Elliptical 4 mins L1 for increased challenge at pelvic floor in upright position.  Pt needed one rest breaks briefly at halfway point due to fatigue but no leakage. ?Sit to stand 10# coordinated with pelvic floor and breathing  ?Quad donkey kicks yellow band 2x10 ?Seated unilateal hip abduction yellow band 2x10  ?Mario pouches 4# bil x20 ?Hip hiking on blue foam x20 ?3# 2x10 ankle weighted hip flexion in standing ? ?05/11/2021: ?Nustep 5 mins L3  ?Bridges with ball squeezes 2x10 ?Bird dog x10 ?Opp arm/hand press x10 each ?X10 Sit to stand 15# ?Squats x10 15# ?300' farmer's carry 10# and 15# cues needed for midline stance to decreased rt side lean ?Bosu unilaterally x15 squats,  x10 forward weight shifts with lead leg on bosu ? ?05/04/2021: ?X20 diaphragmatic breathing in supine ?Ball squeezes 2x10 with exhale ?Sidelying hip abduction with ball press 2x10 ? Quad TA activations 2x10 ?Seated bil shoulder horizontal abductions red band 2x10 with TA activation exhale ?Sit to stand with exhale and pelvic floor contract/relax ?Nustep 5 mins L2 for improved tolerance to activity core activation  and bil leg strengthening ? ?EVAL 04/28/2021: Examination completed, findings reviewed, pt educated on POC, HEP, urge drill, knack method Pt motivated to participate in PT and agreeable to attempt recommendations.  ?  ?  ?  ?PATIENT EDUCATION:  ?Education details: ZB2M_0 H, urge drill, knack method, natural moisturizers without hormones or chemicals  ?Person educated: Patient ?Education method: Explanation, Demonstration, Tactile cues, Verbal cues,  and Handouts ?Education comprehension: verbalized understanding and returned demonstration ?  ?  ?HOME EXERCISE PROGRAM: ?ZB2M_1 H ?  ?ASSESSMENT: ?  ?CLINICAL IMPRESSION: ?Patient reports she continues to see improvement and has no leakage since last session. Pt session focused on hip and core strengthening with coordination of pelvic floor with all tasks for decreased leakage. Increased challenge this date with increased resistance and more in sitting and standing. Pt would benefit from additional PT to further address deficits.   ?  ?  ?OBJECTIVE IMPAIRMENTS decreased coordination, decreased endurance, decreased mobility, decreased strength, increased fascial restrictions, impaired flexibility, improper  body mechanics, and postural dysfunction.  ?  ?ACTIVITY LIMITATIONS community activity and yard work.  ?  ?PERSONAL FACTORS Past/current experiences, Time since onset of injury/illness/exacerbation, and 3+ comorbidities: x4 vaginal births, currently undergoing CA treatments for breast CA  are also affecting patient's functional outcome.  ?  ?  ?REHAB POTENTIAL: Good ?  ?CLINICAL DECISION MAKING: Unstable/unpredictable ?  ?EVALUATION COMPLEXITY: High ?  ?  ?GOALS: ?Goals reviewed with patient? Yes ?  ?SHORT TERM GOALS: Target date: 05/26/2021 ?  ?Pt to be I with HEP. ?Baseline: ?Goal status: INITIAL ?  ?2.  Pt to demonstrate at least 3/5 pelvic floor strength for improved ability to hold urine for decreased leakage.  ?Baseline:  ?Goal status: INITIAL ?  ?3.  Pt to report decrease in leakage to no more than once per day. ?Baseline:  ?Goal status: INITIAL ?  ?4.  Pt to recall improved voiding mechanics for decreased need for straining for bowel movements without cues I.  ?Baseline:  ?Goal status: INITIAL ?  ?LONG TERM GOALS: Target date: 07/28/2021 ?  ?Pt to be I with advanced HEP ?Baseline:  ?Goal status: INITIAL ?  ?2.  Pt to demonstrate at least 5/5 bil hip strength for improved pelvic stability and functional squats  without leakage. ?  ?Baseline:  ?Goal status: INITIAL ?  ?3.  Pt to demonstrate at least 4/5 pelvic floor strength for improved ability to hold urine for decreased leakage.  ?Baseline:  ?Goal status: INITIA

## 2021-05-19 ENCOUNTER — Other Ambulatory Visit: Payer: Self-pay

## 2021-05-19 ENCOUNTER — Inpatient Hospital Stay: Payer: BC Managed Care – PPO

## 2021-05-19 VITALS — BP 122/57 | HR 75 | Temp 98.1°F | Resp 18 | Wt 277.0 lb

## 2021-05-19 DIAGNOSIS — C50411 Malignant neoplasm of upper-outer quadrant of right female breast: Secondary | ICD-10-CM

## 2021-05-19 DIAGNOSIS — Z170 Estrogen receptor positive status [ER+]: Secondary | ICD-10-CM

## 2021-05-19 DIAGNOSIS — Z5112 Encounter for antineoplastic immunotherapy: Secondary | ICD-10-CM | POA: Diagnosis not present

## 2021-05-19 DIAGNOSIS — Z95828 Presence of other vascular implants and grafts: Secondary | ICD-10-CM

## 2021-05-19 LAB — CBC WITH DIFFERENTIAL (CANCER CENTER ONLY)
Abs Immature Granulocytes: 0.02 K/uL (ref 0.00–0.07)
Basophils Absolute: 0 K/uL (ref 0.0–0.1)
Basophils Relative: 1 %
Eosinophils Absolute: 0 K/uL (ref 0.0–0.5)
Eosinophils Relative: 1 %
HCT: 31.4 % — ABNORMAL LOW (ref 36.0–46.0)
Hemoglobin: 10.3 g/dL — ABNORMAL LOW (ref 12.0–15.0)
Immature Granulocytes: 1 %
Lymphocytes Relative: 16 %
Lymphs Abs: 0.6 K/uL — ABNORMAL LOW (ref 0.7–4.0)
MCH: 26.6 pg (ref 26.0–34.0)
MCHC: 32.8 g/dL (ref 30.0–36.0)
MCV: 81.1 fL (ref 80.0–100.0)
Monocytes Absolute: 0.4 K/uL (ref 0.1–1.0)
Monocytes Relative: 12 %
Neutro Abs: 2.6 K/uL (ref 1.7–7.7)
Neutrophils Relative %: 69 %
Platelet Count: 341 K/uL (ref 150–400)
RBC: 3.87 MIL/uL (ref 3.87–5.11)
RDW: 17.9 % — ABNORMAL HIGH (ref 11.5–15.5)
WBC Count: 3.7 K/uL — ABNORMAL LOW (ref 4.0–10.5)
nRBC: 0 % (ref 0.0–0.2)

## 2021-05-19 LAB — CMP (CANCER CENTER ONLY)
ALT: 40 U/L (ref 0–44)
AST: 22 U/L (ref 15–41)
Albumin: 3.9 g/dL (ref 3.5–5.0)
Alkaline Phosphatase: 101 U/L (ref 38–126)
Anion gap: 8 (ref 5–15)
BUN: 13 mg/dL (ref 6–20)
CO2: 27 mmol/L (ref 22–32)
Calcium: 9.4 mg/dL (ref 8.9–10.3)
Chloride: 107 mmol/L (ref 98–111)
Creatinine: 0.84 mg/dL (ref 0.44–1.00)
GFR, Estimated: >60 mL/min (ref >60)
Glucose, Bld: 93 mg/dL (ref 70–99)
Potassium: 3.4 mmol/L — ABNORMAL LOW (ref 3.5–5.1)
Sodium: 142 mmol/L (ref 135–145)
Total Bilirubin: 0.3 mg/dL (ref 0.3–1.2)
Total Protein: 7.2 g/dL (ref 6.5–8.1)

## 2021-05-19 MED ORDER — SODIUM CHLORIDE 0.9 % IV SOLN
10.0000 mg | Freq: Once | INTRAVENOUS | Status: AC
  Administered 2021-05-19: 10 mg via INTRAVENOUS
  Filled 2021-05-19: qty 10

## 2021-05-19 MED ORDER — SODIUM CHLORIDE 0.9 % IV SOLN
50.0000 mg/m2 | Freq: Once | INTRAVENOUS | Status: AC
  Administered 2021-05-19: 126 mg via INTRAVENOUS
  Filled 2021-05-19: qty 21

## 2021-05-19 MED ORDER — SODIUM CHLORIDE 0.9% FLUSH
10.0000 mL | Freq: Once | INTRAVENOUS | Status: AC
  Administered 2021-05-19: 10 mL

## 2021-05-19 MED ORDER — HEPARIN SOD (PORK) LOCK FLUSH 100 UNIT/ML IV SOLN
500.0000 [IU] | Freq: Once | INTRAVENOUS | Status: AC | PRN
  Administered 2021-05-19: 500 [IU]

## 2021-05-19 MED ORDER — SODIUM CHLORIDE 0.9 % IV SOLN: Freq: Once | INTRAVENOUS | Status: AC

## 2021-05-19 MED ORDER — FAMOTIDINE IN NACL 20-0.9 MG/50ML-% IV SOLN
20.0000 mg | Freq: Once | INTRAVENOUS | Status: AC
  Administered 2021-05-19: 20 mg via INTRAVENOUS
  Filled 2021-05-19: qty 50

## 2021-05-19 MED ORDER — SODIUM CHLORIDE 0.9% FLUSH
10.0000 mL | INTRAVENOUS | Status: DC | PRN
  Administered 2021-05-19: 10 mL

## 2021-05-19 MED ORDER — DIPHENHYDRAMINE HCL 50 MG/ML IJ SOLN
25.0000 mg | Freq: Once | INTRAMUSCULAR | Status: AC
  Administered 2021-05-19: 25 mg via INTRAVENOUS
  Filled 2021-05-19: qty 1

## 2021-05-19 NOTE — Patient Instructions (Signed)
[facility]  Discharge Instructions: ?Thank you for choosing [place] [place]  to provide your oncology and hematology care.  ? ?If you have a lab appointment with the [facility], please go directly to the [facility] and check in at the registration area. ?  ?Wear comfortable clothing and clothing appropriate for easy access to any Portacath or PICC line.  ? ?We strive to give you quality time with your provider. You may need to reschedule your appointment if you arrive late (15 or more minutes).  Arriving late affects you and other patients whose appointments are after yours.  Also, if you miss three or more appointments without notifying the office, you may be dismissed from the clinic at the provider?s discretion.    ?  ?For prescription refill requests, have your pharmacy contact our office and allow 72 hours for refills to be completed.   ? ?Today you received the following chemotherapy and/or immunotherapy agents: Paclitaxel    ?  ?To help prevent nausea and vomiting after your treatment, we encourage you to take your nausea medication as directed. ? ?BELOW ARE SYMPTOMS THAT SHOULD BE REPORTED IMMEDIATELY: ?*FEVER GREATER THAN 100.4 F (38 ?C) OR HIGHER ?*CHILLS OR SWEATING ?*NAUSEA AND VOMITING THAT IS NOT CONTROLLED WITH YOUR NAUSEA MEDICATION ?*UNUSUAL SHORTNESS OF BREATH ?*UNUSUAL BRUISING OR BLEEDING ?*URINARY PROBLEMS (pain or burning when urinating, or frequent urination) ?*BOWEL PROBLEMS (unusual diarrhea, constipation, pain near the anus) ?TENDERNESS IN MOUTH AND THROAT WITH OR WITHOUT PRESENCE OF ULCERS (sore throat, sores in mouth, or a toothache) ?UNUSUAL RASH, SWELLING OR PAIN  ?UNUSUAL VAGINAL DISCHARGE OR ITCHING  ? ?Items with * indicate a potential emergency and should be followed up as soon as possible or go to the Emergency Department if any problems should occur. ? ?Please show the CHEMOTHERAPY ALERT CARD or IMMUNOTHERAPY ALERT CARD at check-in to  the Emergency Department and triage nurse. ? ?Should you have questions after your visit or need to cancel or reschedule your appointment, please contact [facility]  Dept: [phone]  and follow the prompts.  Office hours are 8:00 a.m. to 4:30 p.m. Monday - Friday. Please note that voicemails left after 4:00 p.m. may not be returned until the following business day.  We are closed weekends and major holidays. You have access to a nurse at all times for urgent questions. Please call the main number to the clinic Dept: [phone] and follow the prompts. ? ? ?For any non-urgent questions, you may also contact your provider using MyChart. We now offer e-Visits for anyone 32 and older to request care online for non-urgent symptoms. For details visit [email or web address]. ?  ?Also download the MyChart app! Go to the app store, search "MyChart", open the app, select [facility], and log in with your MyChart username and password. ? ?Due to Covid, a mask is required upon entering the hospital/clinic. If you do not have a mask, one will be given to you upon arrival. For doctor visits, patients may have 1 support Alyjah Lovingood aged 79 or older with them. For treatment visits, patients cannot have anyone with them due to current Covid guidelines and our immunocompromised population.  ? ?

## 2021-05-25 ENCOUNTER — Ambulatory Visit: Payer: BC Managed Care – PPO | Attending: Adult Health | Admitting: Physical Therapy

## 2021-05-25 DIAGNOSIS — R293 Abnormal posture: Secondary | ICD-10-CM | POA: Insufficient documentation

## 2021-05-25 DIAGNOSIS — Z170 Estrogen receptor positive status [ER+]: Secondary | ICD-10-CM | POA: Insufficient documentation

## 2021-05-25 DIAGNOSIS — M6281 Muscle weakness (generalized): Secondary | ICD-10-CM | POA: Diagnosis present

## 2021-05-25 DIAGNOSIS — R279 Unspecified lack of coordination: Secondary | ICD-10-CM | POA: Insufficient documentation

## 2021-05-25 DIAGNOSIS — C50411 Malignant neoplasm of upper-outer quadrant of right female breast: Secondary | ICD-10-CM | POA: Insufficient documentation

## 2021-05-25 NOTE — Therapy (Signed)
?OUTPATIENT PHYSICAL THERAPY TREATMENT NOTE ? ? ?Patient Name: Evelyn Marshall ?MRN: [id number] ?DOB:1985/12/23, 36 y.o., female ?Today's Date: 05/25/2021 ? ?PCP: [name], MD ?REFERRING PROVIDER: [name], MD ? ?END OF SESSION:  ? PT End of Session - 05/25/21 1556   ? ? Visit Number 5   ? Date for PT Re-Evaluation 07/28/21   ? Authorization Type BCBS   ? PT Start Time 1536   ? PT Stop Time 1615   ? PT Time Calculation (min) 39 min   ? Activity Tolerance Patient tolerated treatment well   ? Behavior During Therapy [facility] for tasks assessed/performed   ? ?  ?  ? ?  ? ? ? ? ?Past Medical History:  ?Diagnosis Date  ? Abnormal Pap smear 2012  ? colpo result benign  ? Cancer [facility])   ? Chlamydia   ? Family history of breast cancer 12/15/2020  ? History of pre-eclampsia   ? HPV (human papilloma virus) infection   ? Hyperemesis   ? Hypokalemia   ? Pregnancy induced hypertension   ? Ptyalism   ? Vaginal Pap smear, abnormal   ? ?Past Surgical History:  ?Procedure Laterality Date  ? COLPOSCOPY    ? IR IMAGING GUIDED PORT INSERTION  12/24/2020  ? WISDOM TOOTH EXTRACTION    ? ?Patient Active Problem List  ? Diagnosis Date Noted  ? Postpartum care following vaginal delivery 03/16/2021  ? Malignant neoplasm affecting pregnancy in third trimester 03/15/2021  ? Malignant neoplasm affecting pregnancy, antepartum, third trimester 03/13/2021  ? Port-A-Cath in place 12/31/2020  ? Genetic testing 12/27/2020  ? Malignant neoplasm of upper-outer quadrant of right breast in female, estrogen receptor positive ([place]) 12/15/2020  ? Family history of breast cancer 12/15/2020  ? Severe preeclampsia, third trimester 02/25/2019  ? Hyperemesis gravidarum 08/20/2018  ? Preeclampsia, third trimester 11/15/2015  ? Pre-eclampsia during pregnancy in third trimester, antepartum 11/15/2015  ? Preeclampsia 11/15/2015  ? Mild preeclampsia 11/12/2015  ? Hypokalemia, inadequate intake 06/17/2015  ? Ptyalism 05/18/2015  ? Chronic hypertension in pregnancy  05/18/2015  ? Morbid obesity with BMI of 40.0-44.9, adult ([place]) 05/18/2015  ? Rh negative, maternal 05/18/2015  ? ? ?REFERRING DIAG: O9A.113 (ICD-10-CM) - Malignant neoplasm affecting pregnancy, antepartum, third trimester ?C50.411,Z17.0 (ICD-10-CM) - Malignant neoplasm of upper-outer quadrant of ? ?THERAPY DIAG:  ?Muscle weakness (generalized) ? ?Unspecified lack of coordination ? ?Abnormal posture ? ?PERTINENT HISTORY: Malignant neoplasm of upper-outer quadrant of right breast in female, estrogen receptor positive ([place]) ?Staging form: Breast, AJCC 8th Edition ?- Clinical: Stage IV (cT3, cN2, pM1, G2, ER+, PR-, HER2+) - Signed by Nicholas Lose, MD on 12/15/2020 ?Initial Diagnosis 12/08/20; staging 12/15/20; chemo Adjuvant AC q21d 122/22-02/23/21; genetic testing 12/29/20; chemo Patient is on Treatment Plan : BREAST AC q21 days / Trastuzumab + Pertuzumab + Weekly Paclitaxel q21d   03/25/21-present  ?Sexual abuse: No ? ?PRECAUTIONS: Other: postpartum (fourth baby 03/16/21) ? ?SUBJECTIVE: Pt reports leakage she does still have leakage with multiple sneezes or coughs but no leakage with singular cough or sneeze. And pt reports "I have been trying to pay more attention and sometimes am going to the bathroom 2-3 times with longer chemo treatments. Pt also reports her energy levels have been improving overall and very pleased with this too.  ? ?PAIN:  ?Are you having pain? No ? ?OBJECTIVE:  ?  ?DIAGNOSTIC FINDINGS:  ?  ?  ?COGNITION: ?           Overall cognitive status: Within functional limits  for tasks assessed              ?            ?SENSATION: ?           Light touch: Appears intact ?           Proprioception: Appears intact ?  ?MUSCLE LENGTH: ?Hamstrings and adductors limited by 25% bil ?  ?  ?  ?  ?POSTURE:  ?Rounded shoulders, posterior pelvic tilt ?  ?LUMBARAROM/PROM ?  ?Bil side bending and rotation limited by 25% all others WFL ?  ?LE ROM: ?Bil LE WFL ?  ?LE MMT: ?  ?Bil hip abduction 3+/5 all others 4/5 ?   ?PELVIC MMT: deferred per pt request 05/04/2021  ?  ?MMT   ?04/28/2021  ?Vaginal    ?Internal Anal Sphincter    ?External Anal Sphincter    ?Puborectalis    ?Diastasis Recti No separation felt this date at rest or with shoulders lifted from table however pt did demonstrate difficulty with maintain a minimal crunch position   ?(Blank rows = not tested) ?  ?      PALPATION: ?  General  no TTP throughout but mild restrictions noted in bil lower abdominal region ?  ?              External Perineal Exam deferred ?              ?              Internal Pelvic Floor deferred ?  ?TONE: ?deferred ?  ?PROLAPSE: ?deferred ?  ?TODAY'S TREATMENT  ? ?05/25/2021: ?Elliptical  5 mins L2 for increased challenge at pelvic floor in upright position. Pt needed one rest breaks briefly at halfway point due to fatigue but no leakage. ?2x10 squats 20# ?2x10 deadlifts 20# ?Bosu step on/over x10 each leg ?Quick step over hurdle x10 each leg ? ?05/18/2021: ?Elliptical 4 mins L1 for increased challenge at pelvic floor in upright position.  Pt needed one rest breaks briefly at halfway point due to fatigue but no leakage. ?Sit to stand 10# coordinated with pelvic floor and breathing  ?Quad donkey kicks yellow band 2x10 ?Seated unilateal hip abduction yellow band 2x10  ?Mario pouches 4# bil x20 ?Hip hiking on blue foam x20 ?3# 2x10 ankle weighted hip flexion in standing ? ?05/11/2021: ?Nustep 5 mins L3  ?Bridges with ball squeezes 2x10 ?Bird dog x10 ?Opp arm/hand press x10 each ?X10 Sit to stand 15# ?Squats x10 15# ?300' farmer's carry 10# and 15# cues needed for midline stance to decreased rt side lean ?Bosu unilaterally x15 squats,  x10 forward weight shifts with lead leg on bosu ? ?05/04/2021: ?X20 diaphragmatic breathing in supine ?Ball squeezes 2x10 with exhale ?Sidelying hip abduction with ball press 2x10 ? Quad TA activations 2x10 ?Seated bil shoulder horizontal abductions red band 2x10 with TA activation exhale ?Sit to stand with exhale and  pelvic floor contract/relax ?Nustep 5 mins L2 for improved tolerance to activity core activation  and bil leg strengthening ? ?EVAL 04/28/2021: Examination completed, findings reviewed, pt educated on POC, HEP, urge drill, knack method Pt motivated to participate in PT and agreeable to attempt recommendations.  ?  ?  ?  ?PATIENT EDUCATION:  ?Education details: [id number]' H ?Person educated: Patient ?Education method: Explanation, Demonstration, Tactile cues, Verbal cues, and Handouts ?Education comprehension: verbalized understanding and returned demonstration ?  ?  ?HOME EXERCISE PROGRAM: ?[id number]' H ?  ?ASSESSMENT: ?  ?CLINICAL  IMPRESSION: ?Patient reports she continues to see improvement only having leakage with multiple coughs or sneezes in a row, no longer with one sneeze or cough. Pt session focused on hip and core strengthening with coordination of pelvic floor and breathing for improved strengthening of pelvic floor and decreased strain or decreased leakage. Pt would benefit from additional PT to further address deficits.   ?  ?  ?OBJECTIVE IMPAIRMENTS decreased coordination, decreased endurance, decreased mobility, decreased strength, increased fascial restrictions, impaired flexibility, improper body mechanics, and postural dysfunction.  ?  ?ACTIVITY LIMITATIONS community activity and yard work.  ?  ?PERSONAL FACTORS Past/current experiences, Time since onset of injury/illness/exacerbation, and 3+ comorbidities: x4 vaginal births, currently undergoing CA treatments for breast CA  are also affecting patient's functional outcome.  ?  ?  ?REHAB POTENTIAL: Good ?  ?CLINICAL DECISION MAKING: Unstable/unpredictable ?  ?EVALUATION COMPLEXITY: High ?  ?  ?GOALS: ?Goals reviewed with patient? Yes ?  ?SHORT TERM GOALS: Target date: 05/26/2021 ?  ?Pt to be I with HEP. ?Baseline: ?Goal status: MET ?  ?2.  Pt to demonstrate at least 3/5 pelvic floor strength for improved ability to hold urine for decreased leakage.  ?Baseline:   ?Goal status: ON GOING, PT HAS DEFERRED AT THIS TIME ?  ?3.  Pt to report decrease in leakage to no more than once per day. ?Baseline:  ?Goal status: MET ?  ?4.  Pt to recall improved voiding mechanics for decreased nee

## 2021-05-26 ENCOUNTER — Other Ambulatory Visit: Payer: Self-pay

## 2021-05-26 ENCOUNTER — Inpatient Hospital Stay: Payer: BC Managed Care – PPO | Attending: Hematology and Oncology

## 2021-05-26 ENCOUNTER — Inpatient Hospital Stay: Payer: BC Managed Care – PPO | Admitting: Hematology and Oncology

## 2021-05-26 ENCOUNTER — Inpatient Hospital Stay: Payer: BC Managed Care – PPO

## 2021-05-26 ENCOUNTER — Telehealth: Payer: Self-pay

## 2021-05-26 ENCOUNTER — Inpatient Hospital Stay (HOSPITAL_BASED_OUTPATIENT_CLINIC_OR_DEPARTMENT_OTHER): Payer: BC Managed Care – PPO | Admitting: Hematology and Oncology

## 2021-05-26 VITALS — BP 138/91

## 2021-05-26 DIAGNOSIS — Z5111 Encounter for antineoplastic chemotherapy: Secondary | ICD-10-CM | POA: Diagnosis present

## 2021-05-26 DIAGNOSIS — C50411 Malignant neoplasm of upper-outer quadrant of right female breast: Secondary | ICD-10-CM

## 2021-05-26 DIAGNOSIS — Z5112 Encounter for antineoplastic immunotherapy: Secondary | ICD-10-CM | POA: Diagnosis present

## 2021-05-26 DIAGNOSIS — D709 Neutropenia, unspecified: Secondary | ICD-10-CM | POA: Diagnosis not present

## 2021-05-26 DIAGNOSIS — C773 Secondary and unspecified malignant neoplasm of axilla and upper limb lymph nodes: Secondary | ICD-10-CM | POA: Insufficient documentation

## 2021-05-26 DIAGNOSIS — Z170 Estrogen receptor positive status [ER+]: Secondary | ICD-10-CM

## 2021-05-26 DIAGNOSIS — Z95828 Presence of other vascular implants and grafts: Secondary | ICD-10-CM

## 2021-05-26 LAB — CBC WITH DIFFERENTIAL (CANCER CENTER ONLY)
Abs Immature Granulocytes: 0.02 10*3/uL (ref 0.00–0.07)
Basophils Absolute: 0 10*3/uL (ref 0.0–0.1)
Basophils Relative: 1 %
Eosinophils Absolute: 0.1 10*3/uL (ref 0.0–0.5)
Eosinophils Relative: 1 %
HCT: 31.2 % — ABNORMAL LOW (ref 36.0–46.0)
Hemoglobin: 10.3 g/dL — ABNORMAL LOW (ref 12.0–15.0)
Immature Granulocytes: 1 %
Lymphocytes Relative: 17 %
Lymphs Abs: 0.6 10*3/uL — ABNORMAL LOW (ref 0.7–4.0)
MCH: 26.8 pg (ref 26.0–34.0)
MCHC: 33 g/dL (ref 30.0–36.0)
MCV: 81 fL (ref 80.0–100.0)
Monocytes Absolute: 0.4 10*3/uL (ref 0.1–1.0)
Monocytes Relative: 10 %
Neutro Abs: 2.6 10*3/uL (ref 1.7–7.7)
Neutrophils Relative %: 70 %
Platelet Count: 348 10*3/uL (ref 150–400)
RBC: 3.85 MIL/uL — ABNORMAL LOW (ref 3.87–5.11)
RDW: 17.2 % — ABNORMAL HIGH (ref 11.5–15.5)
WBC Count: 3.8 10*3/uL — ABNORMAL LOW (ref 4.0–10.5)
nRBC: 0 % (ref 0.0–0.2)

## 2021-05-26 LAB — CMP (CANCER CENTER ONLY)
ALT: 38 U/L (ref 0–44)
AST: 25 U/L (ref 15–41)
Albumin: 4 g/dL (ref 3.5–5.0)
Alkaline Phosphatase: 102 U/L (ref 38–126)
Anion gap: 8 (ref 5–15)
BUN: 13 mg/dL (ref 6–20)
CO2: 27 mmol/L (ref 22–32)
Calcium: 9.4 mg/dL (ref 8.9–10.3)
Chloride: 105 mmol/L (ref 98–111)
Creatinine: 0.89 mg/dL (ref 0.44–1.00)
GFR, Estimated: >60 mL/min (ref >60)
Glucose, Bld: 98 mg/dL (ref 70–99)
Potassium: 3.3 mmol/L — ABNORMAL LOW (ref 3.5–5.1)
Sodium: 140 mmol/L (ref 135–145)
Total Bilirubin: 0.4 mg/dL (ref 0.3–1.2)
Total Protein: 7.2 g/dL (ref 6.5–8.1)

## 2021-05-26 MED ORDER — FAMOTIDINE IN NACL 20-0.9 MG/50ML-% IV SOLN
20.0000 mg | Freq: Once | INTRAVENOUS | Status: AC
  Administered 2021-05-26: 20 mg via INTRAVENOUS
  Filled 2021-05-26: qty 50

## 2021-05-26 MED ORDER — SODIUM CHLORIDE 0.9 % IV SOLN: Freq: Once | INTRAVENOUS | Status: AC

## 2021-05-26 MED ORDER — SODIUM CHLORIDE 0.9 % IV SOLN
50.0000 mg/m2 | Freq: Once | INTRAVENOUS | Status: AC
  Administered 2021-05-26: 126 mg via INTRAVENOUS
  Filled 2021-05-26: qty 21

## 2021-05-26 MED ORDER — HEPARIN SOD (PORK) LOCK FLUSH 100 UNIT/ML IV SOLN
500.0000 [IU] | Freq: Once | INTRAVENOUS | Status: AC | PRN
  Administered 2021-05-26: 500 [IU]

## 2021-05-26 MED ORDER — TRASTUZUMAB-DKST CHEMO 150 MG IV SOLR
6.0000 mg/kg | Freq: Once | INTRAVENOUS | Status: AC
  Administered 2021-05-26: 798 mg via INTRAVENOUS
  Filled 2021-05-26: qty 38

## 2021-05-26 MED ORDER — SODIUM CHLORIDE 0.9% FLUSH
10.0000 mL | Freq: Once | INTRAVENOUS | Status: AC
  Administered 2021-05-26: 10 mL

## 2021-05-26 MED ORDER — SODIUM CHLORIDE 0.9 % IV SOLN
420.0000 mg | Freq: Once | INTRAVENOUS | Status: AC
  Administered 2021-05-26: 420 mg via INTRAVENOUS
  Filled 2021-05-26: qty 14

## 2021-05-26 MED ORDER — DIPHENHYDRAMINE HCL 50 MG/ML IJ SOLN
25.0000 mg | Freq: Once | INTRAMUSCULAR | Status: AC
  Administered 2021-05-26: 25 mg via INTRAVENOUS
  Filled 2021-05-26: qty 1

## 2021-05-26 MED ORDER — SODIUM CHLORIDE 0.9 % IV SOLN
10.0000 mg | Freq: Once | INTRAVENOUS | Status: AC
  Administered 2021-05-26: 10 mg via INTRAVENOUS
  Filled 2021-05-26: qty 10

## 2021-05-26 MED ORDER — SODIUM CHLORIDE 0.9% FLUSH
10.0000 mL | INTRAVENOUS | Status: DC | PRN
  Administered 2021-05-26: 10 mL

## 2021-05-26 MED ORDER — ACETAMINOPHEN 325 MG PO TABS
650.0000 mg | ORAL_TABLET | Freq: Once | ORAL | Status: AC
  Administered 2021-05-26: 650 mg via ORAL
  Filled 2021-05-26: qty 2

## 2021-05-26 NOTE — Progress Notes (Signed)
? ?Patient Care Team: ?[name], MD as PCP - General (Family Medicine) ?Janina Mayo, MD as PCP - Cardiology (Cardiology) ?Nicholas Lose, MD as Consulting Physician (Hematology and Oncology) ?Servando Salina, MD as Consulting Physician (Obstetrics and Gynecology) ?Irene Limbo, MD as Consulting Physician (Plastic Surgery) ?Rolm Bookbinder, MD as Consulting Physician (General Surgery) ? ?DIAGNOSIS:  ?Encounter Diagnosis  ?Name Primary?  ? Malignant neoplasm of upper-outer quadrant of right breast in female, estrogen receptor positive ([place])   ? ? ?SUMMARY OF ONCOLOGIC HISTORY: ?Oncology History  ?Malignant neoplasm of upper-outer quadrant of right breast in female, estrogen receptor positive ([place])  ?12/08/2020 Initial Diagnosis  ? 21-week pregnancy: MRI at [facility] noncontrast revealed right axillary mass 3.7 cm, subpectoral lymph nodes, left supraclavicular lymph node, multiple level 2 and 3 lymph nodes in the right axilla, 2 breast masses 1.1 cm largest size.  Mammogram in [place] revealed 3 small masses at 12:00 1.2 cm, 0.6 cm, 0.9 cm and 5 abnormal axillary lymph nodes, pleomorphic calcifications measuring 10 cm, biopsy revealed grade 2 IDC with necrosis and calcifications ER 30%, PR 0%, HER2 positive, Ki-67 30 to 40% ?  ?12/15/2020 Cancer Staging  ? Staging form: Breast, AJCC 8th Edition ?- Clinical: Stage IV (cT3, cN2, pM1, G2, ER+, PR-, HER2+) - Signed by Nicholas Lose, MD on 12/15/2020 ?Histologic grading system: 3 grade system ? ?  ?12/24/2020 - 02/23/2021 Chemotherapy  ? Patient is on Treatment Plan : BREAST Adjuvant AC q21d  ? ?  ?  ?12/29/2020 Genetic Testing  ? Negative hereditary cancer genetic testing: no pathogenic variants detected in [facility] BRCAPlus Panel or [employer] CustomNext-Cancer +RNAinsight Panel.  The report dates are 12/23/2020 and 12/29/2020.  ? ?The BRCAplus panel offered by [employer] and includes sequencing and deletion/duplication analysis for the following 8 genes: ATM,  BRCA1, BRCA2, CDH1, CHEK2, PALB2, PTEN, and TP53.  The CustomNext-Cancer+RNAinsight panel offered by [facility] includes sequencing and rearrangement analysis for the following 47 genes:  APC, ATM, AXIN2, BARD1, BMPR1A, BRCA1, BRCA2, BRIP1, CDH1, CDK4, CDKN2A, CHEK2, DICER1, EPCAM, GREM1, HOXB13, MEN1, MLH1, MSH2, MSH3, MSH6, MUTYH, NBN, NF1, NF2, NTHL1, PALB2, PMS2, POLD1, POLE, PTEN, RAD51C, RAD51D, RECQL, RET, SDHA, SDHAF2, SDHB, SDHC, SDHD, SMAD4, SMARCA4, STK11, TP53, TSC1, TSC2, and VHL.  RNA data is routinely analyzed for use in variant interpretation for all genes. ?  ?03/25/2021 -  Chemotherapy  ? Patient is on Treatment Plan : BREAST AC q21 days / Trastuzumab + Pertuzumab + Weekly Paclitaxel q21d  ? ?  ?  ? ? ?CHIEF COMPLIANT: Cycle 10 Taxol (with Herceptin Perjeta every 3 weeks) ? ?INTERVAL HISTORY: Evelyn Marshall is a 36 year old above-mentioned history of breast cancer who is HER2 positive and she is currently receiving Taxol Herceptin and Perjeta.  She is tolerating the treatment extremely well without any major problems or concerns.  She thinks that the breast is getting smaller and less painful and she thinks that the treatment is working.  She reports no neuropathy.  Denies any nausea or vomiting.  Did not she does have intermittent diarrhea for which she takes Imodium. ? ? ?ALLERGIES:  has No Known Allergies. ? ?MEDICATIONS:  ?Current Outpatient Medications  ?Medication Sig Dispense Refill  ? Doxylamine-Pyridoxine 10-10 MG TBEC Take 1 tablet by mouth 3 (three) times daily as needed (nausea).    ? ibuprofen (ADVIL) 600 MG tablet Take 1 tablet (600 mg total) by mouth every 6 (six) hours as needed for mild pain or moderate pain. 30 tablet 11  ?  Iron-FA-B Cmp-C-Biot-Probiotic (FUSION PLUS) CAPS Take 1 capsule by mouth daily.    ? labetalol (NORMODYNE) 200 MG tablet Take 1 tablet (200 mg total) by mouth 2 (two) times daily. 60 tablet 11  ? lidocaine-prilocaine (EMLA) cream Apply 1 application.  topically as needed (apply once weekly prior to port a cath needle insertion.). 30 g 0  ? NIFEdipine (ADALAT CC) 30 MG 24 hr tablet Take 1 tablet (30 mg total) by mouth daily. 30 tablet 11  ? ondansetron (ZOFRAN) 8 MG tablet Take 1 tablet (8 mg total) by mouth every 8 (eight) hours as needed for nausea or vomiting. 20 tablet 0  ? prochlorperazine (COMPAZINE) 10 MG tablet TAKE 1 TABLET(10 MG) BY MOUTH EVERY 6 HOURS AS NEEDED FOR NAUSEA OR VOMITING 30 tablet 0  ? ?No current facility-administered medications for this visit.  ? ?Facility-Administered Medications Ordered in Other Visits  ?Medication Dose Route Frequency Provider Last Rate Last Admin  ? dexamethasone (DECADRON) 10 mg in sodium chloride 0.9 % 50 mL IVPB  10 mg Intravenous Once Nicholas Lose, MD      ? famotidine (PEPCID) IVPB 20 mg premix  20 mg Intravenous Once Nicholas Lose, MD      ? heparin lock flush 100 unit/mL  500 Units Intracatheter Once PRN Nicholas Lose, MD      ? PACLitaxel (TAXOL) 126 mg in sodium chloride 0.9 % 250 mL chemo infusion (</= 68m/m2)  50 mg/m2 (Treatment Plan Recorded) Intravenous Once GNicholas Lose MD      ? pertuzumab (PERJETA) 420 mg in sodium chloride 0.9 % 250 mL chemo infusion  420 mg Intravenous Once GNicholas Lose MD      ? sodium chloride flush (NS) 0.9 % injection 10 mL  10 mL Intracatheter PRN GNicholas Lose MD      ? trastuzumab-dkst (OGIVRI) 798 mg in sodium chloride 0.9 % 250 mL chemo infusion  6 mg/kg (Treatment Plan Recorded) Intravenous Once GNicholas Lose MD      ? ? ?PHYSICAL EXAMINATION: ?ECOG PERFORMANCE STATUS: 1 - Symptomatic but completely ambulatory ? ?There were no vitals filed for this visit. ?There were no vitals filed for this visit. ?  ? ?LABORATORY DATA:  ?I have reviewed the data as listed ? ?  Latest Ref Rng & Units 05/26/2021  ?  9:10 AM 05/19/2021  ?  8:24 AM 05/12/2021  ?  8:20 AM  ?CMP  ?Glucose 70 - 99 mg/dL 98   93   103    ?BUN 6 - 20 mg/dL '13   13   12    ' ?Creatinine 0.44 - 1.00 mg/dL 0.89    0.84   0.84    ?Sodium 135 - 145 mmol/L 140   142   140    ?Potassium 3.5 - 5.1 mmol/L 3.3   3.4   3.5    ?Chloride 98 - 111 mmol/L 105   107   106    ?CO2 22 - 32 mmol/L '27   27   27    ' ?Calcium 8.9 - 10.3 mg/dL 9.4   9.4   9.4    ?Total Protein 6.5 - 8.1 g/dL 7.2   7.2   7.6    ?Total Bilirubin 0.3 - 1.2 mg/dL 0.4   0.3   0.4    ?Alkaline Phos 38 - 126 U/L 102   101   100    ?AST 15 - 41 U/L '25   22   24    ' ?ALT 0 -  44 U/L 38   40   35    ? ? ?Lab Results  ?Component Value Date  ? WBC 3.8 (L) 05/26/2021  ? HGB 10.3 (L) 05/26/2021  ? HCT 31.2 (L) 05/26/2021  ? MCV 81.0 05/26/2021  ? PLT 348 05/26/2021  ? NEUTROABS 2.6 05/26/2021  ? ? ?ASSESSMENT & PLAN:  ?Malignant neoplasm of upper-outer quadrant of right breast in female, estrogen receptor positive ([place]) ?12/08/2020:21-week pregnancy: MRI at [facility] noncontrast revealed right axillary mass 3.7 cm, subpectoral lymph nodes, left supraclavicular lymph node, multiple level 2 and 3 lymph nodes in the right axilla, 2 breast masses 1.1 cm largest size.  Mammogram in [place] revealed 3 small masses at 12:00 1.2 cm, 0.6 cm, 0.9 cm and 5 abnormal axillary lymph nodes, pleomorphic calcifications measuring 10 cm, biopsy revealed grade 2 IDC with necrosis and calcifications ER 30%, PR 0%, HER2 positive, Ki-67 30 to 40% ?  ?Treatment plan: ?1.  Neoadjuvant chemotherapy with Adriamycin and Cytoxan every 3 weeks x4 started 12/24/2020 completed 02/23/2021 ?2. delivery of the baby ?3.  Continue neoadjuvant therapy with Taxol Herceptin and Perjeta ?4.  Mastectomy with ALND versus breast conserving surgery if she has a dramatic response ?5.  Adjuvant radiation therapy ?6.  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   ?Current treatment: Cycle 10 Taxol (every 3 week  Herceptin Perjeta) ?She tolerated Herceptin and Perjeta extremely well.  She had a few episodes of diarrhea which were controlled with Imodium ?2.   Leukopenia: ANC 3.8 (now at a reduced dosage of Taxol 50 mg per meter

## 2021-05-26 NOTE — Telephone Encounter (Signed)
Patient notified of completion of Form 73. Fax Transmission confirmation received. Copy of form given to Patient in Person during infusion. No other needs or concerns voiced at this time. ?

## 2021-05-26 NOTE — Patient Instructions (Signed)
[facility]  Discharge Instructions: ?Thank you for choosing [facility] to provide your oncology and hematology care.  ? ?If you have a lab appointment with the [facility], please go directly to the [facility] and check in at the registration area. ?  ?Wear comfortable clothing and clothing appropriate for easy access to any Portacath or PICC line.  ? ?We strive to give you quality time with your provider. You may need to reschedule your appointment if you arrive late (15 or more minutes).  Arriving late affects you and other patients whose appointments are after yours.  Also, if you miss three or more appointments without notifying the office, you may be dismissed from the clinic at the provider?s discretion.    ?  ?For prescription refill requests, have your pharmacy contact our office and allow 72 hours for refills to be completed.   ? ?Today you received the following chemotherapy and/or immunotherapy agents: Trastuzumab, Pertuzumab, Paclitaxel.     ?  ?To help prevent nausea and vomiting after your treatment, we encourage you to take your nausea medication as directed. ? ?BELOW ARE SYMPTOMS THAT SHOULD BE REPORTED IMMEDIATELY: ?*FEVER GREATER THAN 100.4 F (38 ?C) OR HIGHER ?*CHILLS OR SWEATING ?*NAUSEA AND VOMITING THAT IS NOT CONTROLLED WITH YOUR NAUSEA MEDICATION ?*UNUSUAL SHORTNESS OF BREATH ?*UNUSUAL BRUISING OR BLEEDING ?*URINARY PROBLEMS (pain or burning when urinating, or frequent urination) ?*BOWEL PROBLEMS (unusual diarrhea, constipation, pain near the anus) ?TENDERNESS IN MOUTH AND THROAT WITH OR WITHOUT PRESENCE OF ULCERS (sore throat, sores in mouth, or a toothache) ?UNUSUAL RASH, SWELLING OR PAIN  ?UNUSUAL VAGINAL DISCHARGE OR ITCHING  ? ?Items with * indicate a potential emergency and should be followed up as soon as possible or go to the Emergency Department if any problems should occur. ? ?Please show the CHEMOTHERAPY ALERT CARD or  IMMUNOTHERAPY ALERT CARD at check-in to the Emergency Department and triage nurse. ? ?Should you have questions after your visit or need to cancel or reschedule your appointment, please contact [facility]  Dept: [phone]  and follow the prompts.  Office hours are 8:00 a.m. to 4:30 p.m. Monday - Friday. Please note that voicemails left after 4:00 p.m. may not be returned until the following business day.  We are closed weekends and major holidays. You have access to a nurse at all times for urgent questions. Please call the main number to the clinic Dept: [phone] and follow the prompts. ? ? ?For any non-urgent questions, you may also contact your provider using MyChart. We now offer e-Visits for anyone 82 and older to request care online for non-urgent symptoms. For details visit [email or web address]. ?  ?Also download the MyChart app! Go to the app store, search "MyChart", open the app, select [facility], and log in with your MyChart username and password. ? ?Due to Covid, a mask is required upon entering the hospital/clinic. If you do not have a mask, one will be given to you upon arrival. For doctor visits, patients may have 1 support person aged 86 or older with them. For treatment visits, patients cannot have anyone with them due to current Covid guidelines and our immunocompromised population.  ? ?

## 2021-05-26 NOTE — Assessment & Plan Note (Signed)
12/08/2020:21-week pregnancy: MRI at [facility] noncontrast revealed right axillary mass 3.7 cm, subpectoral lymph nodes, left supraclavicular lymph node, multiple level 2 and 3 lymph nodes in the right axilla, 2 breast masses 1.1 cm largest size. ?Mammogram in [place] revealed 3 small masses at 12:00 1.2 cm, 0.6 cm, 0.9 cm and 5 abnormal axillary lymph nodes, pleomorphic calcifications measuring 10 cm, biopsy revealed grade 2 IDC with necrosis and calcifications ER 30%, PR 0%, HER2 positive, Ki-67 30 to 40% ?? ?Treatment plan: ?1. ?Neoadjuvant chemotherapy with Adriamycin and Cytoxan every 3 weeks x4?started 12/24/2020 completed 02/23/2021 ?2. delivery of the baby ?3. ?Continue neoadjuvant therapy with Taxol Herceptin and Perjeta ?4. ?Mastectomy with ALND versus breast conserving surgery if she has a dramatic response ?5. ?Adjuvant radiation therapy ?6. ?Adjuvant antiestrogen therapy with ovarian function suppression ?Genetic testing ?---------------------------------------------------------------------------------------------------------------------------------- ?Evelyn Marshall has delivered a healthy boy?([name]) ? ? ?Current treatment:?Cycle 10?Taxol?(every 3 week?Herceptin Perjeta) ?She tolerated Herceptin and Perjeta extremely well. ?She had a few episodes of diarrhea which were controlled with Imodium ?Leukopenia: ANC 2.8?(now at a reduced dosage of?Taxol 50 mg per metered square) ?? ?We will order a breast MRI to be done on 06/02/2021.  I sent a message to Dr. Donne Hazel to see her afterwards. ?Return to clinic weekly for chemo and every other week for follow-up with [place]. ?? ?

## 2021-05-27 ENCOUNTER — Encounter: Payer: Self-pay | Admitting: Hematology and Oncology

## 2021-05-27 NOTE — Progress Notes (Signed)
This encounter was created in error - please disregard.

## 2021-05-30 ENCOUNTER — Ambulatory Visit: Payer: BC Managed Care – PPO

## 2021-05-31 NOTE — Progress Notes (Signed)
Patient Care Team: [name], MD as PCP - General (Family Medicine) Janina Mayo, MD as PCP - Cardiology (Cardiology) Nicholas Lose, MD as Consulting Physician (Hematology and Oncology) Servando Salina, MD as Consulting Physician (Obstetrics and Gynecology) Irene Limbo, MD as Consulting Physician (Plastic Surgery) Rolm Bookbinder, MD as Consulting Physician (General Surgery)  DIAGNOSIS:  Encounter Diagnosis  Name Primary?   Malignant neoplasm of upper-outer quadrant of right breast in female, estrogen receptor positive ([place])     SUMMARY OF ONCOLOGIC HISTORY: Oncology History  Malignant neoplasm of upper-outer quadrant of right breast in female, estrogen receptor positive ([place])  12/08/2020 Initial Diagnosis   21-week pregnancy: MRI at [facility] noncontrast revealed right axillary mass 3.7 cm, subpectoral lymph nodes, left supraclavicular lymph node, multiple level 2 and 3 lymph nodes in the right axilla, 2 breast masses 1.1 cm largest size.  Mammogram in [place] revealed 3 small masses at 12:00 1.2 cm, 0.6 cm, 0.9 cm and 5 abnormal axillary lymph nodes, pleomorphic calcifications measuring 10 cm, biopsy revealed grade 2 IDC with necrosis and calcifications ER 30%, PR 0%, HER2 positive, Ki-67 30 to 40%   12/15/2020 Cancer Staging   Staging form: Breast, AJCC 8th Edition - Clinical: Stage IV (cT3, cN2, pM1, G2, ER+, PR-, HER2+) - Signed by Nicholas Lose, MD on 12/15/2020 Histologic grading system: 3 grade system    12/24/2020 - 02/23/2021 Chemotherapy   Patient is on Treatment Plan : BREAST Adjuvant AC q21d      12/29/2020 Genetic Testing   Negative hereditary cancer genetic testing: no pathogenic variants detected in [facility] BRCAPlus Panel or [facility] CustomNext-Cancer +RNAinsight Panel.  The report dates are 12/23/2020 and 12/29/2020.   The BRCAplus panel offered by [employer] and includes sequencing and deletion/duplication analysis for the following 8 genes: ATM, BRCA1,  BRCA2, CDH1, CHEK2, PALB2, PTEN, and TP53.  The CustomNext-Cancer+RNAinsight panel offered by [facility] includes sequencing and rearrangement analysis for the following 47 genes:  APC, ATM, AXIN2, BARD1, BMPR1A, BRCA1, BRCA2, BRIP1, CDH1, CDK4, CDKN2A, CHEK2, DICER1, EPCAM, GREM1, HOXB13, MEN1, MLH1, MSH2, MSH3, MSH6, MUTYH, NBN, NF1, NF2, NTHL1, PALB2, PMS2, POLD1, POLE, PTEN, RAD51C, RAD51D, RECQL, RET, SDHA, SDHAF2, SDHB, SDHC, SDHD, SMAD4, SMARCA4, STK11, TP53, TSC1, TSC2, and VHL.  RNA data is routinely analyzed for use in variant interpretation for all genes.   03/25/2021 -  Chemotherapy   Patient is on Treatment Plan : BREAST AC q21 days / Trastuzumab + Pertuzumab + Weekly Paclitaxel q21d        CHIEF COMPLIANT: Follow-up on Mri, cycle 12 Taxol Herceptin Perjeta  INTERVAL HISTORY: Evelyn Marshall is a 36 year old above-mentioned history of breast cancer who is HER2 positive and she is currently receiving Taxol Herceptin and Perjeta. She presents to the clinic today for a follow-up on Mri. She was pleased with her Mri results. She has been tolerating chemo extremely well without any neuropathy or any other problems.  She denies any nausea or vomiting.  Today is her last cycle of chemotherapy.   ALLERGIES:  has No Known Allergies.  MEDICATIONS:  Current Outpatient Medications  Medication Sig Dispense Refill   Doxylamine-Pyridoxine 10-10 MG TBEC Take 1 tablet by mouth 3 (three) times daily as needed (nausea).     ibuprofen (ADVIL) 600 MG tablet Take 1 tablet (600 mg total) by mouth every 6 (six) hours as needed for mild pain or moderate pain. 30 tablet 11   Iron-FA-B Cmp-C-Biot-Probiotic (FUSION PLUS) CAPS Take 1 capsule by mouth daily.  labetalol (NORMODYNE) 200 MG tablet Take 1 tablet (200 mg total) by mouth 2 (two) times daily. 60 tablet 11   lidocaine-prilocaine (EMLA) cream Apply 1 application. topically as needed (apply once weekly prior to port a cath needle insertion.). 30 g 0    NIFEdipine (ADALAT CC) 30 MG 24 hr tablet Take 1 tablet (30 mg total) by mouth daily. 30 tablet 11   ondansetron (ZOFRAN) 8 MG tablet Take 1 tablet (8 mg total) by mouth every 8 (eight) hours as needed for nausea or vomiting. 20 tablet 0   prochlorperazine (COMPAZINE) 10 MG tablet TAKE 1 TABLET(10 MG) BY MOUTH EVERY 6 HOURS AS NEEDED FOR NAUSEA OR VOMITING 30 tablet 0   No current facility-administered medications for this visit.    PHYSICAL EXAMINATION: ECOG PERFORMANCE STATUS: 1 - Symptomatic but completely ambulatory  Vitals:   06/09/21 0941  BP: 139/71  Pulse: 90  Resp: 17  Temp: (!) 97.2 F (36.2 C)  SpO2: 97%   Filed Weights   06/09/21 0941  Weight: 278 lb 9.6 oz (126.4 kg)      LABORATORY DATA:  I have reviewed the data as listed    Latest Ref Rng & Units 06/02/2021    9:32 AM 05/26/2021    9:10 AM 05/19/2021    8:24 AM  CMP  Glucose 70 - 99 mg/dL 113   98   93    BUN 6 - 20 mg/dL '11   13   13    ' Creatinine 0.44 - 1.00 mg/dL 0.78   0.89   0.84    Sodium 135 - 145 mmol/L 140   140   142    Potassium 3.5 - 5.1 mmol/L 3.5   3.3   3.4    Chloride 98 - 111 mmol/L 105   105   107    CO2 22 - 32 mmol/L '28   27   27    ' Calcium 8.9 - 10.3 mg/dL 9.3   9.4   9.4    Total Protein 6.5 - 8.1 g/dL 7.3   7.2   7.2    Total Bilirubin 0.3 - 1.2 mg/dL 0.4   0.4   0.3    Alkaline Phos 38 - 126 U/L 92   102   101    AST 15 - 41 U/L '21   25   22    ' ALT 0 - 44 U/L 37   38   40      Lab Results  Component Value Date   WBC 4.5 06/09/2021   HGB 11.0 (L) 06/09/2021   HCT 33.5 (L) 06/09/2021   MCV 81.5 06/09/2021   PLT 352 06/09/2021   NEUTROABS 3.4 06/09/2021    ASSESSMENT & PLAN:  Malignant neoplasm of upper-outer quadrant of right breast in female, estrogen receptor positive ([place]) 12/08/2020:21-week pregnancy: MRI at [facility] noncontrast revealed right axillary mass 3.7 cm, subpectoral lymph nodes, left supraclavicular lymph node, multiple level 2 and 3 lymph nodes in the right  axilla, 2 breast masses 1.1 cm largest size.  Mammogram in [place] revealed 3 small masses at 12:00 1.2 cm, 0.6 cm, 0.9 cm and 5 abnormal axillary lymph nodes, pleomorphic calcifications measuring 10 cm, biopsy revealed grade 2 IDC with necrosis and calcifications ER 30%, PR 0%, HER2 positive, Ki-67 30 to 40%  PET/CT 03/24/2021: Multifocal right breast cancer, hypermetabolic right axillary, subpectoral, bilateral supraclavicular and mediastinal lymph nodes  Treatment plan: 1.  Neoadjuvant chemotherapy with Adriamycin and Cytoxan every  3 weeks x4 started 12/24/2020 completed 02/23/2021 2. delivery of the baby 3.  Continue neoadjuvant therapy with Taxol Herceptin and Perjeta 4.  Mastectomy with ALND  5.  Adjuvant radiation therapy (radiation oncology is not certain of benefits of radiation given PET/CT findings) 6.  Adjuvant antiestrogen therapy with ovarian function suppression Genetic testing ---------------------------------------------------------------------------------------------------------------------------------- Evelyn Marshall has delivered a healthy boy Vella Raring)    Current treatment: Cycle 12 Taxol (every 3 week Herceptin Perjeta) She tolerated Herceptin and Perjeta extremely well.  She had a few episodes of diarrhea which were controlled with Imodium 2.   Leukopenia: ANC 3.8 (now at a reduced dosage of Taxol 50 mg per metered square)    breast MRI  06/02/2021: Significant interval improvement in the right without any remaining masses or non-mass enhancement.  Improvement in right-sided adenopathy  Return to clinic after surgery.  Further adjuvant anti-HER2 therapy could be with Herceptin Perjeta versus Kadcyla. We we will add 1 more treatment with Herceptin and Perjeta in 3 weeks.   No orders of the defined types were placed in this encounter.  The patient has a good understanding of the overall plan. she agrees with it. she will call with any problems that may develop before the next  visit here. Total time spent: 30 mins including face to face time and time spent for planning, charting and co-ordination of care   Harriette Ohara, MD 06/09/21    I Gardiner Coins am scribing for Dr. Lindi Adie  I have reviewed the above documentation for accuracy and completeness, and I agree with the above.

## 2021-06-01 ENCOUNTER — Ambulatory Visit
Admission: RE | Admit: 2021-06-01 | Discharge: 2021-06-01 | Disposition: A | Discharge status: Home / Self Care | Payer: BC Managed Care – PPO | Source: Ambulatory Visit | Attending: Hematology and Oncology | Admitting: Hematology and Oncology

## 2021-06-01 ENCOUNTER — Ambulatory Visit: Payer: BC Managed Care – PPO

## 2021-06-01 ENCOUNTER — Ambulatory Visit: Payer: BC Managed Care – PPO | Admitting: Physical Therapy

## 2021-06-01 DIAGNOSIS — R293 Abnormal posture: Secondary | ICD-10-CM

## 2021-06-01 DIAGNOSIS — C50411 Malignant neoplasm of upper-outer quadrant of right female breast: Secondary | ICD-10-CM

## 2021-06-01 DIAGNOSIS — R279 Unspecified lack of coordination: Secondary | ICD-10-CM

## 2021-06-01 DIAGNOSIS — M6281 Muscle weakness (generalized): Secondary | ICD-10-CM

## 2021-06-01 IMAGING — MR MR BREAST BILAT WO/W CM
8 of 12 series · 32 of 48 positions shown · IV contrast (10 gadavist)
Comparison: MRI [DATE]

CLINICAL DATA: The patient was diagnosed with breast cancer at 20
weeks gestational age. Follow-up after neoadjuvant treatment.

EXAM:
BILATERAL BREAST MRI WITH AND WITHOUT CONTRAST
TECHNIQUE: Multiplanar, multisequence MR images of both breasts were obtained
prior to and following the intravenous administration of 10 ml of
Gadavist

[Series 2: t2_tirm_tra ipat (a-p) · axial · 3.0mm · 0.78mm/px · 1 of 55 slices shown]
[im 1/55]
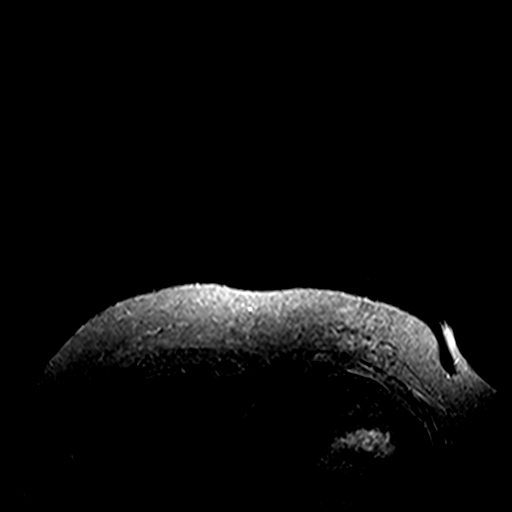

[Series 3: fl3d pre-cm no · axial · non-contrast · 1.2mm · 1.04mm/px · z∈[-62,+109]mm · 5 of 144 slices shown]
[im 1/144]
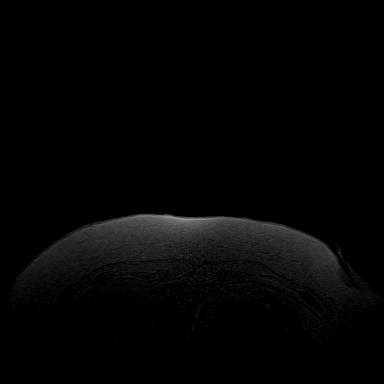
[im 36/144]
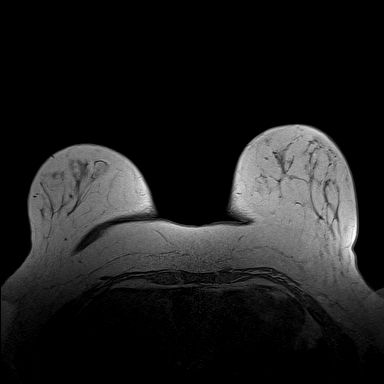
[im 72/144]
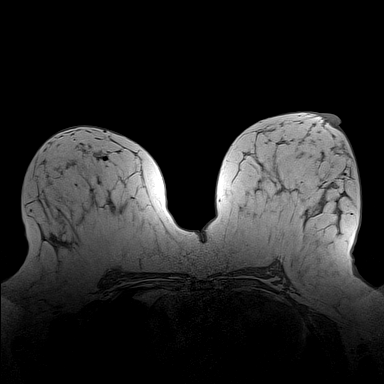
[im 108/144]
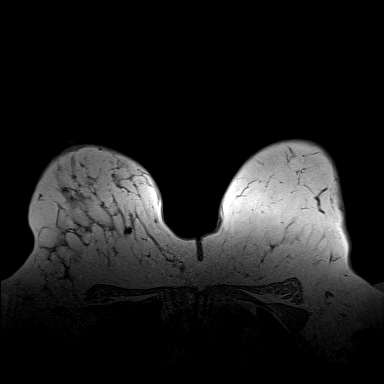
[im 144/144]
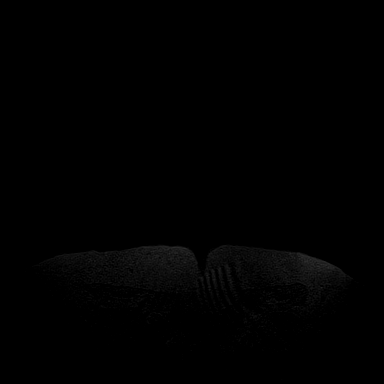

[Series 4: fl3d pre-cm · axial · non-contrast · 1.2mm · 1.04mm/px · z∈[-62,+109]mm · 5 of 144 slices shown]
[im 1/144]
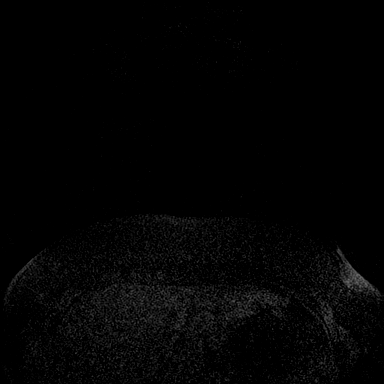
[im 36/144]
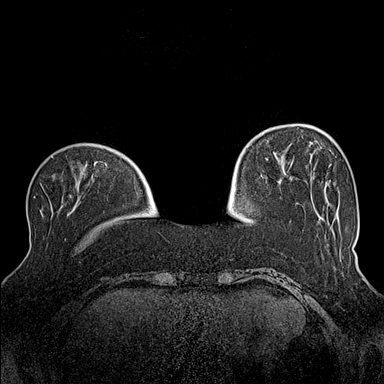
[im 72/144]
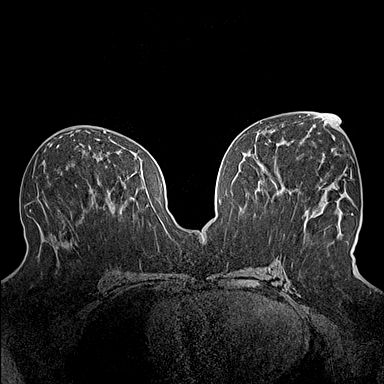
[im 108/144]
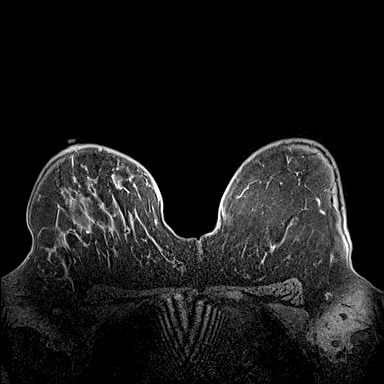
[im 144/144]
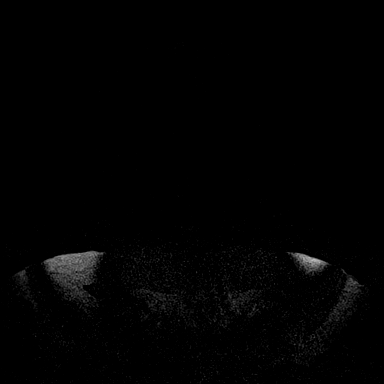

[Series 5: fl3d post-cm 20 · axial · 1.2mm · 1.04mm/px · z∈[-62,+109]mm · 5 of 144 slices shown (1 of 3)]
[im 1/144]
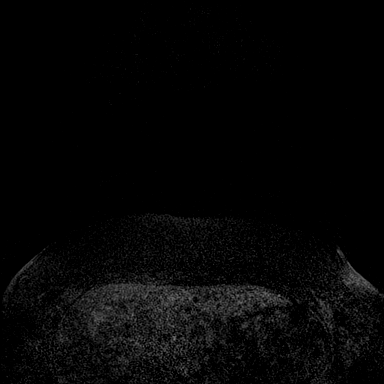
[im 36/144]
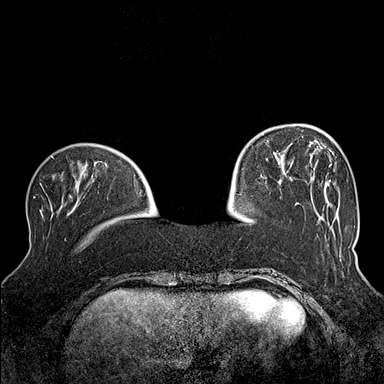
[im 72/144]
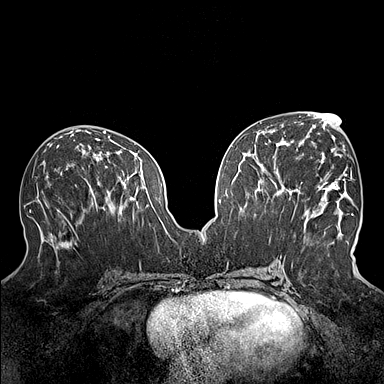
[im 108/144]
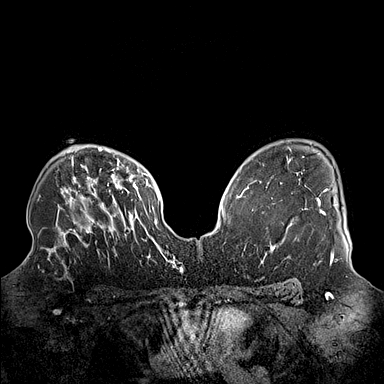
[im 144/144]
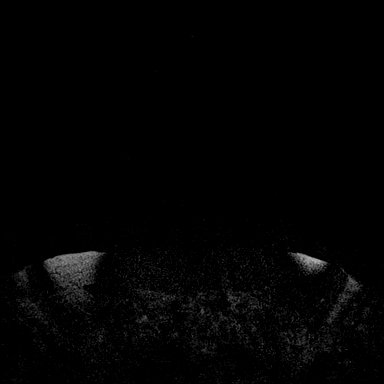

[Series 6: fl3d post-cm 20 · axial · 1.2mm · 1.04mm/px · z∈[-62,+109]mm · 5 of 144 slices shown (2 of 3)]
[im 1/144]
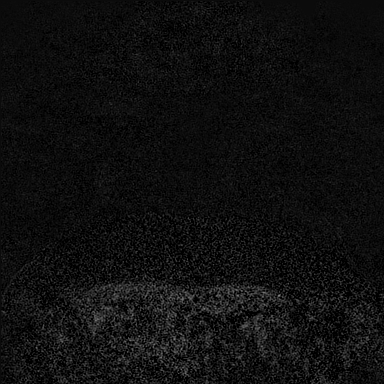
[im 36/144]
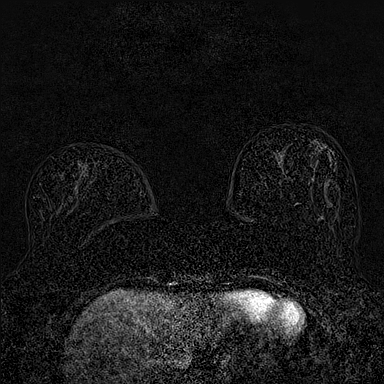
[im 72/144]
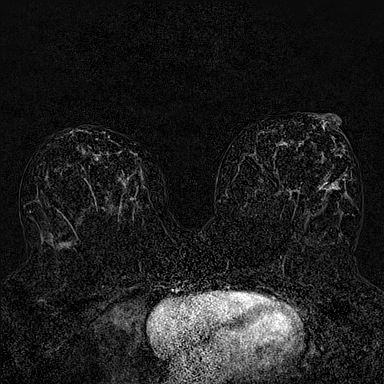
[im 108/144]
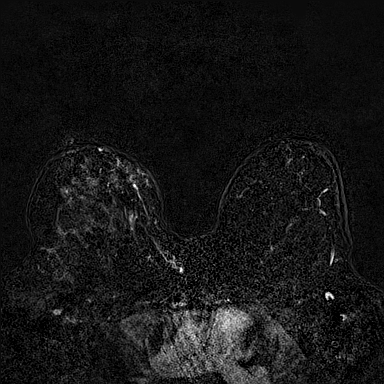
[im 144/144]
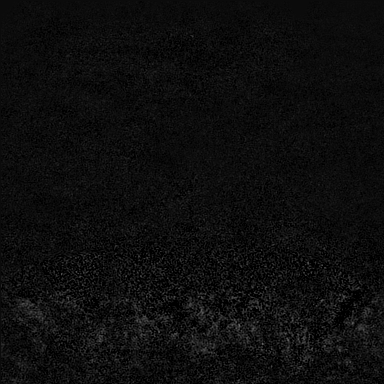

[Series 7: fl3d post-cm 20 · axial · 172.8mm · 1.04mm/px · 1 of 1 slices shown (3 of 3)]
[im 1/1]
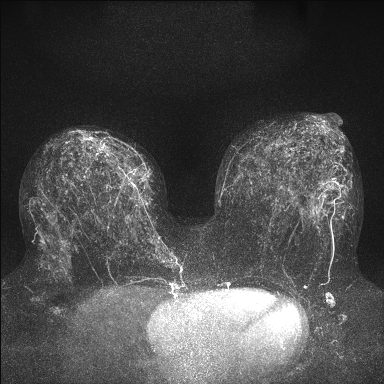

[Series 8: fl3d post-cm 3 · axial · 1.2mm · 1.04mm/px · z∈[-62,+109]mm · 6 of 144 slices shown (1 of 2)]
[im 1/144]
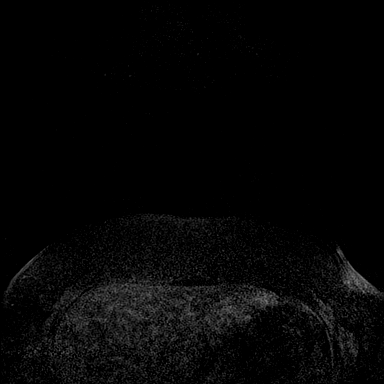
[im 29/144]
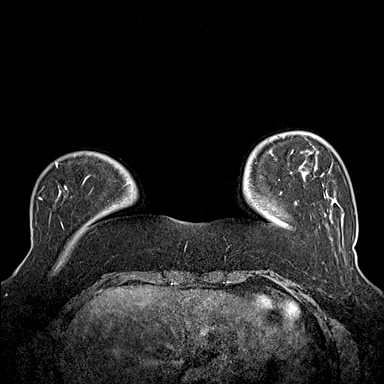
[im 58/144]
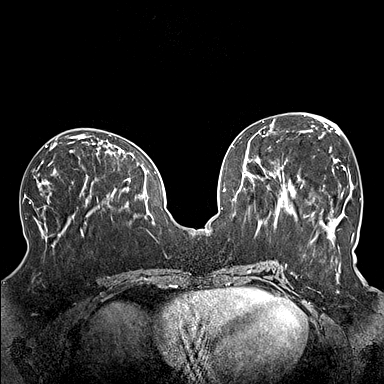
[im 86/144]
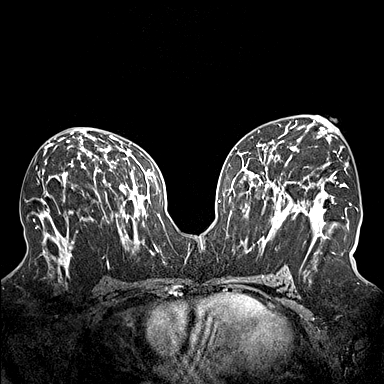
[im 115/144]
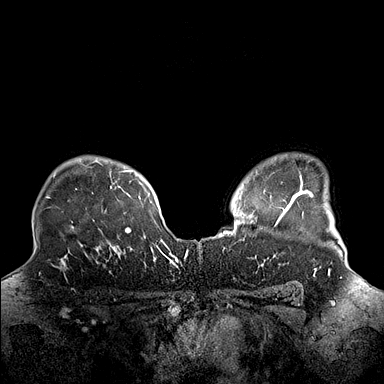
[im 144/144]
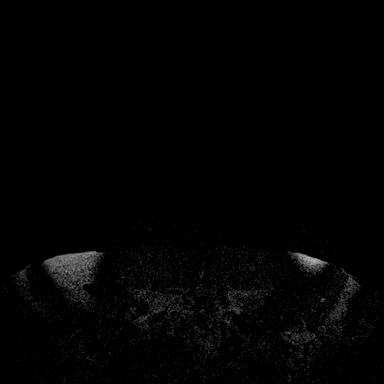

[Series 9: fl3d post-cm 3 · axial · 1.2mm · 1.04mm/px · z∈[-62,+40]mm · 4 of 144 slices shown (2 of 2)]
[im 1/144]
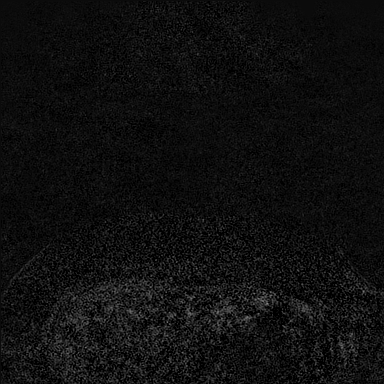
[im 29/144]
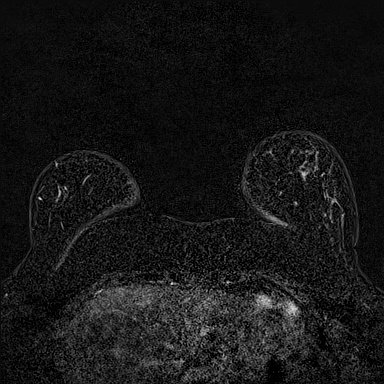
[im 58/144]
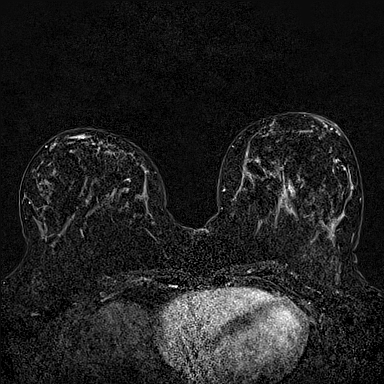
[im 86/144]
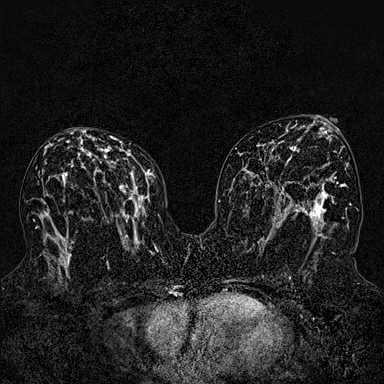

[32 of 48 positions shown; findings below may reference images not displayed]

Three-dimensional MR images were rendered by post-processing of the
original MR data on an independent workstation. The
three-dimensional MR images were interpreted, and findings are
reported in the following complete MRI report for this study. Three
dimensional images were evaluated at the independent interpreting
workstation using the DynaCAD thin client.
FINDINGS: Breast composition: b. Scattered fibroglandular tissue.

Background parenchymal enhancement: Marked

Right breast: There has been significant interval improvement on the
right. In fact, the large regions of non mass enhancement identified
on the previous MRI can no longer be differentiated from background
enhancement. Previous skin thickening has resolved. Biopsy clips are
in stable position. No discrete suspicious mass or non mass
enhancement remains.

Left breast: No mass or abnormal enhancement.

Lymph nodes: Left-sided lymph nodes are normal. The confluent
adenopathy in the right axilla involving level 1, level 2, and level
3 has significantly improved. No abnormal level 3 nodes are
identified. While the lymph nodes at levels 1 and 2 remain mildly
prominent relative to the left, there has been marked interval
improvement. A representative prominent level 2 node is identified
on series 5, image 13 with a 12 mm short axis measurement and cortex
measuring 5 mm.

Ancillary findings:  None.
IMPRESSION: 1. There has been significant interval improvement on the right.
There are no remaining masses or non mass enhancement that can be
differentiated from background enhancement. Skin thickening has
resolved.
2. No MRI evidence of malignancy on the left.
3. Significant interval improvement in right-sided adenopathy. No
abnormal level 3 nodes are identified today. The level 1 and 2 nodes
remain prominent compared to the left but are significantly
improved.

RECOMMENDATION:
Recommend continued surgical and oncologic follow-up/treatment.

BI-RADS CATEGORY  6: Known biopsy-proven malignancy.

## 2021-06-01 MED ORDER — GADOBUTROL 1 MMOL/ML IV SOLN
10.0000 mL | Freq: Once | INTRAVENOUS | Status: AC | PRN
  Administered 2021-06-01: 10 mL via INTRAVENOUS

## 2021-06-01 NOTE — Therapy (Signed)
?OUTPATIENT PHYSICAL THERAPY TREATMENT NOTE ? ? ?Patient Name: Evelyn Marshall ?MRN: [id number] ?DOB:1985/05/05, 36 y.o., female ?Today's Date: 06/01/2021 ? ?PCP: [name], MD ?REFERRING PROVIDER: Wilber Bihari Cornett* ? ?END OF SESSION:  ? PT End of Session - 06/01/21 1603   ? ? Visit Number 6   ? Date for PT Re-Evaluation 07/28/21   ? Authorization Type BCBS   ? PT Start Time 1536   pt arrival time  ? PT Stop Time 1696   pt requesting to complete breast cancer PT treatment while here as well  ? PT Time Calculation (min) 31 min   ? Activity Tolerance Patient tolerated treatment well   ? Behavior During Therapy [facility] for tasks assessed/performed   ? ?  ?  ? ?  ? ? ? ? ? ?Past Medical History:  ?Diagnosis Date  ? Abnormal Pap smear 2012  ? colpo result benign  ? Cancer [facility])   ? Chlamydia   ? Family history of breast cancer 12/15/2020  ? History of pre-eclampsia   ? HPV (human papilloma virus) infection   ? Hyperemesis   ? Hypokalemia   ? Pregnancy induced hypertension   ? Ptyalism   ? Vaginal Pap smear, abnormal   ? ?Past Surgical History:  ?Procedure Laterality Date  ? COLPOSCOPY    ? IR IMAGING GUIDED PORT INSERTION  12/24/2020  ? WISDOM TOOTH EXTRACTION    ? ?Patient Active Problem List  ? Diagnosis Date Noted  ? Postpartum care following vaginal delivery 03/16/2021  ? Malignant neoplasm affecting pregnancy in third trimester 03/15/2021  ? Malignant neoplasm affecting pregnancy, antepartum, third trimester 03/13/2021  ? Port-A-Cath in place 12/31/2020  ? Genetic testing 12/27/2020  ? Malignant neoplasm of upper-outer quadrant of right breast in female, estrogen receptor positive ([place]) 12/15/2020  ? Family history of breast cancer 12/15/2020  ? Severe preeclampsia, third trimester 02/25/2019  ? Hyperemesis gravidarum 08/20/2018  ? Preeclampsia, third trimester 11/15/2015  ? Pre-eclampsia during pregnancy in third trimester, antepartum 11/15/2015  ? Preeclampsia 11/15/2015  ? Mild preeclampsia 11/12/2015  ?  Hypokalemia, inadequate intake 06/17/2015  ? Ptyalism 05/18/2015  ? Chronic hypertension in pregnancy 05/18/2015  ? Morbid obesity with BMI of 40.0-44.9, adult ([place]) 05/18/2015  ? Rh negative, maternal 05/18/2015  ? ? ?REFERRING DIAG: O9A.113 (ICD-10-CM) - Malignant neoplasm affecting pregnancy, antepartum, third trimester ?C50.411,Z17.0 (ICD-10-CM) - Malignant neoplasm of upper-outer quadrant of ? ?THERAPY DIAG:  ?Muscle weakness (generalized) ? ?Unspecified lack of coordination ? ?Abnormal posture ? ?PERTINENT HISTORY: Malignant neoplasm of upper-outer quadrant of right breast in female, estrogen receptor positive ([place]) ?Staging form: Breast, AJCC 8th Edition ?- Clinical: Stage IV (cT3, cN2, pM1, G2, ER+, PR-, HER2+) - Signed by Nicholas Lose, MD on 12/15/2020 ?Initial Diagnosis 12/08/20; staging 12/15/20; chemo Adjuvant AC q21d 122/22-02/23/21; genetic testing 12/29/20; chemo Patient is on Treatment Plan : BREAST AC q21 days / Trastuzumab + Pertuzumab + Weekly Paclitaxel q21d   03/25/21-present  ?Sexual abuse: No ? ?PRECAUTIONS: Other: postpartum (fourth baby 03/16/21) ? ?SUBJECTIVE: Pt reports she only had leakage Friday and Saturday with increased sneezing though this was right after chemo treatment Thursday and pt more tired on these days usually.  ? ?PAIN:  ?Are you having pain? No ? ?OBJECTIVE:  ?  ?DIAGNOSTIC FINDINGS:  ?  ?  ?COGNITION: ?           Overall cognitive status: Within functional limits for tasks assessed              ?            ?  SENSATION: ?           Light touch: Appears intact ?           Proprioception: Appears intact ?  ?MUSCLE LENGTH: ?Hamstrings and adductors limited by 25% bil ?  ?  ?  ?  ?POSTURE:  ?Rounded shoulders, posterior pelvic tilt ?  ?LUMBARAROM/PROM ?  ?Bil side bending and rotation limited by 25% all others WFL ?  ?LE ROM: ?Bil LE WFL ?  ?LE MMT: ?  ?Bil hip abduction 3+/5 all others 4/5 ?  ?PELVIC MMT: deferred per pt request 05/04/2021  ?  ?MMT   ?04/28/2021 06/01/2021   ?Vaginal    4/5; 8 reps; 6s  ?Internal Anal Sphincter     ?External Anal Sphincter     ?Puborectalis     ?Diastasis Recti No separation felt this date at rest or with shoulders lifted from table however pt did demonstrate difficulty with maintain a minimal crunch position    ?(Blank rows = not tested) ?  ?      PALPATION: ?  General  no TTP throughout but mild restrictions noted in bil lower abdominal region ?  ?              External Perineal Exam deferred ?              ?              Internal Pelvic Floor deferred ?  ?TONE: ?deferred ?  ?PROLAPSE: ?deferred ?  ?TODAY'S TREATMENT  ? ?06/01/2021: ?Manual: internal vaginal assessment consented to by pt today. ?Results above in chart.  Pt benefited from cues for  decreased glute activation and quickly able to correct. Pt also benefited from cues for decreased breath holding and able  to correct this as well.  ?2x10 pelvic floor contractions ?4K87 quick flicks ?X10 isometrics 6s ? ?05/25/2021: ?Elliptical  5 mins L2 for increased challenge at pelvic floor in upright position. Pt needed one rest breaks briefly at halfway point due to fatigue but no leakage. ?2x10 squats 20# ?2x10 deadlifts 20# ?Bosu step on/over x10 each leg ?Quick step over hurdle x10 each leg ? ?05/18/2021: ?Elliptical 4 mins L1 for increased challenge at pelvic floor in upright position.  Pt needed one rest breaks briefly at halfway point due to fatigue but no leakage. ?Sit to stand 10# coordinated with pelvic floor and breathing  ?Quad donkey kicks yellow band 2x10 ?Seated unilateal hip abduction yellow band 2x10  ?Mario pouches 4# bil x20 ?Hip hiking on blue foam x20 ?3# 2x10 ankle weighted hip flexion in standing ? ?05/11/2021: ?Nustep 5 mins L3  ?Bridges with ball squeezes 2x10 ?Bird dog x10 ?Opp arm/hand press x10 each ?X10 Sit to stand 15# ?Squats x10 15# ?300' farmer's carry 10# and 15# cues needed for midline stance to decreased rt side lean ?Bosu unilaterally x15 squats,  x10 forward weight shifts with  lead leg on bosu ? ?05/04/2021: ?X20 diaphragmatic breathing in supine ?Ball squeezes 2x10 with exhale ?Sidelying hip abduction with ball press 2x10 ? Quad TA activations 2x10 ?Seated bil shoulder horizontal abductions red band 2x10 with TA activation exhale ?Sit to stand with exhale and pelvic floor contract/relax ?Nustep 5 mins L2 for improved tolerance to activity core activation  and bil leg strengthening ? ?EVAL 04/28/2021: Examination completed, findings reviewed, pt educated on POC, HEP, urge drill, knack method Pt motivated to participate in PT and agreeable to attempt recommendations.  ?  ?  ?  ?  PATIENT EDUCATION:  ?Education details: ZB2M_0 H ?Person educated: Patient ?Education method: Explanation, Demonstration, Tactile cues, Verbal cues, and Handouts ?Education comprehension: verbalized understanding and returned demonstration ?  ?  ?HOME EXERCISE PROGRAM: ?ZB2M_1 H ?  ?ASSESSMENT: ?  ?CLINICAL IMPRESSION: ?Patient pleased with progress so far and reports she feels she is still improving. Pt did have a few instance of leakage after last weeks chemo treatment but was fatigued and these were all after heavy sneezing. Pt session focused on internal vaginal assessment and treatment with pt's consent. Results and details above. Pt would benefit from additional PT to further address deficits.   ?  ?  ?OBJECTIVE IMPAIRMENTS decreased coordination, decreased endurance, decreased mobility, decreased strength, increased fascial restrictions, impaired flexibility, improper body mechanics, and postural dysfunction.  ?  ?ACTIVITY LIMITATIONS community activity and yard work.  ?  ?PERSONAL FACTORS Past/current experiences, Time since onset of injury/illness/exacerbation, and 3+ comorbidities: x4 vaginal births, currently undergoing CA treatments for breast CA  are also affecting patient's functional outcome.  ?  ?  ?REHAB POTENTIAL: Good ?  ?CLINICAL DECISION MAKING: Unstable/unpredictable ?  ?EVALUATION COMPLEXITY:  High ?  ?  ?GOALS: ?Goals reviewed with patient? Yes ?  ?SHORT TERM GOALS: Target date: 05/26/2021 ?  ?Pt to be I with HEP. ?Baseline: ?Goal status: MET ?  ?2.  Pt to demonstrate at least 3/5 pelvic floor strength f

## 2021-06-01 NOTE — Therapy (Signed)
?OUTPATIENT PHYSICAL THERAPY SOZO SCREENING NOTE ? ? ?Patient Name: Evelyn [facility] ?MRN: [id number] ?DOB:Dec 19, 1985, 36 y.o., female ?Today's Date: 06/01/2021 ? ?PCP: [name], MD ?REFERRING PROVIDER: [name], MD ? ? PT End of Session - 06/01/21 1607   ? ? Visit Number 6   SCREEN ONLY  ? Date for PT Re-Evaluation 07/28/21   ? Authorization Type BCBS   ? PT Start Time 1615   ? PT Stop Time 1625   ? PT Time Calculation (min) 10 min   ? Activity Tolerance Patient tolerated treatment well   ? Behavior During Therapy [facility] for tasks assessed/performed   ? ?  ?  ? ?  ? ? ?Past Medical History:  ?Diagnosis Date  ? Abnormal Pap smear 2012  ? colpo result benign  ? Cancer [facility])   ? Chlamydia   ? Family history of breast cancer 12/15/2020  ? History of pre-eclampsia   ? HPV (human papilloma virus) infection   ? Hyperemesis   ? Hypokalemia   ? Pregnancy induced hypertension   ? Ptyalism   ? Vaginal Pap smear, abnormal   ? ?Past Surgical History:  ?Procedure Laterality Date  ? COLPOSCOPY    ? IR IMAGING GUIDED PORT INSERTION  12/24/2020  ? WISDOM TOOTH EXTRACTION    ? ?Patient Active Problem List  ? Diagnosis Date Noted  ? Postpartum care following vaginal delivery 03/16/2021  ? Malignant neoplasm affecting pregnancy in third trimester 03/15/2021  ? Malignant neoplasm affecting pregnancy, antepartum, third trimester 03/13/2021  ? Port-A-Cath in place 12/31/2020  ? Genetic testing 12/27/2020  ? Malignant neoplasm of upper-outer quadrant of right breast in female, estrogen receptor positive ([place]) 12/15/2020  ? Family history of breast cancer 12/15/2020  ? Severe preeclampsia, third trimester 02/25/2019  ? Hyperemesis gravidarum 08/20/2018  ? Preeclampsia, third trimester 11/15/2015  ? Pre-eclampsia during pregnancy in third trimester, antepartum 11/15/2015  ? Preeclampsia 11/15/2015  ? Mild preeclampsia 11/12/2015  ? Hypokalemia, inadequate intake 06/17/2015  ? Ptyalism 05/18/2015  ? Chronic hypertension in pregnancy 05/18/2015   ? Morbid obesity with BMI of 40.0-44.9, adult ([place]) 05/18/2015  ? Rh negative, maternal 05/18/2015  ? ? ?REFERRING DIAG: right breast cancer at risk for lymphedema ? ?THERAPY DIAG:  ?Abnormal posture ? ?Malignant neoplasm of upper-outer quadrant of right breast in female, estrogen receptor positive ([place]) ? ?PERTINENT HISTORY: She delivered her baby almost 3 months ago. She had an abnormal noninvasive pregnancy testing due to her high risk pregnancy. This also coincided with the presence of a right breast mass. She is on a study at the [facility] that she was being evaluated for all of this called the identify study.  She underwent a full body MRI. This showed several right axillary and subpectoral nodes the largest measuring 3.7 cm as well as multiple right breast nodules. She also had a prominent 1.4 cm left supraclavicular node. She was then evaluated here with mammogram and ultrasound. Mammogram shows several small masses measuring between 6 to 12 mm. There is also 10 x 10 x 10 cm of calcifications. She has at least 5 abnormal lymph nodes. Biopsy of one of the lymph nodes is positive. 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  She is due to have a port Friday and start chemotherapy the same day. She is also due to have additional biopsies of the left supra clavicular node as well as the calcifications  tomorrow. She does have a family history of maternal cousin at age 32 is deceased from breast cancer and has genetic testing pending currently. Plan is neoadjuvant chemo, deliver baby, continue chemo. Surgery around May, radiation to follow and antiestrogens. Pt is a Special needs teacher at [employer]  ? ?PRECAUTIONS: ACTIVE CA,  ? ?SUBJECTIVE: I had my baby on Feb. 22nd.  I still don't have a date for sx yet. Last chemo is May 18. I haven't had too many side effects. ? ?PAIN:  ?Are you having pain? No ? ?SOZO SCREENING: ?Patient was  assessed today using the SOZO machine to determine the lymphedema index score. This was compared to her baseline score. It was determined that she is within the recommended range when compared to her baseline and no further action is needed at this time. She will continue SOZO screenings. These are done every 3 months for 2 years post operatively followed by every 6 months for 2 years, and then annually. ? ?Patient was assessed today using the SOZO machine to determine the lymphedema index score. This was compared to her baseline score. It was determined that she is NOT within the recommended range when compared to her baseline and so she was fitted for a compression garment while in the clinic today. It is recommended she return in 1 month to be reassessed. If she continues to measure outside the recommended range, physical therapy treatment will be recommended at that time and a referral requested. ? ?BASELINE TODAY 4.2 ? ?Claris Pong, PT ?06/01/2021, 4:30 PM ? ?  ? ?

## 2021-06-02 ENCOUNTER — Inpatient Hospital Stay: Payer: BC Managed Care – PPO

## 2021-06-02 ENCOUNTER — Other Ambulatory Visit: Payer: Self-pay

## 2021-06-02 VITALS — BP 141/95 | HR 74 | Temp 98.8°F | Resp 18 | Wt 278.8 lb

## 2021-06-02 DIAGNOSIS — Z95828 Presence of other vascular implants and grafts: Secondary | ICD-10-CM

## 2021-06-02 DIAGNOSIS — C50411 Malignant neoplasm of upper-outer quadrant of right female breast: Secondary | ICD-10-CM

## 2021-06-02 DIAGNOSIS — Z5181 Encounter for therapeutic drug level monitoring: Secondary | ICD-10-CM

## 2021-06-02 DIAGNOSIS — Z5112 Encounter for antineoplastic immunotherapy: Secondary | ICD-10-CM | POA: Diagnosis not present

## 2021-06-02 LAB — CBC WITH DIFFERENTIAL (CANCER CENTER ONLY)
Abs Immature Granulocytes: 0.02 10*3/uL (ref 0.00–0.07)
Basophils Absolute: 0 10*3/uL (ref 0.0–0.1)
Basophils Relative: 1 %
Eosinophils Absolute: 0.1 10*3/uL (ref 0.0–0.5)
Eosinophils Relative: 2 %
HCT: 31.2 % — ABNORMAL LOW (ref 36.0–46.0)
Hemoglobin: 10.3 g/dL — ABNORMAL LOW (ref 12.0–15.0)
Immature Granulocytes: 1 %
Lymphocytes Relative: 18 %
Lymphs Abs: 0.7 10*3/uL (ref 0.7–4.0)
MCH: 27 pg (ref 26.0–34.0)
MCHC: 33 g/dL (ref 30.0–36.0)
MCV: 81.7 fL (ref 80.0–100.0)
Monocytes Absolute: 0.3 10*3/uL (ref 0.1–1.0)
Monocytes Relative: 9 %
Neutro Abs: 2.7 10*3/uL (ref 1.7–7.7)
Neutrophils Relative %: 69 %
Platelet Count: 349 10*3/uL (ref 150–400)
RBC: 3.82 MIL/uL — ABNORMAL LOW (ref 3.87–5.11)
RDW: 17.3 % — ABNORMAL HIGH (ref 11.5–15.5)
WBC Count: 3.8 10*3/uL — ABNORMAL LOW (ref 4.0–10.5)
nRBC: 0 % (ref 0.0–0.2)

## 2021-06-02 LAB — CMP (CANCER CENTER ONLY)
ALT: 37 U/L (ref 0–44)
AST: 21 U/L (ref 15–41)
Albumin: 3.9 g/dL (ref 3.5–5.0)
Alkaline Phosphatase: 92 U/L (ref 38–126)
Anion gap: 7 (ref 5–15)
BUN: 11 mg/dL (ref 6–20)
CO2: 28 mmol/L (ref 22–32)
Calcium: 9.3 mg/dL (ref 8.9–10.3)
Chloride: 105 mmol/L (ref 98–111)
Creatinine: 0.78 mg/dL (ref 0.44–1.00)
GFR, Estimated: >60 mL/min (ref >60)
Glucose, Bld: 113 mg/dL — ABNORMAL HIGH (ref 70–99)
Potassium: 3.5 mmol/L (ref 3.5–5.1)
Sodium: 140 mmol/L (ref 135–145)
Total Bilirubin: 0.4 mg/dL (ref 0.3–1.2)
Total Protein: 7.3 g/dL (ref 6.5–8.1)

## 2021-06-02 MED ORDER — SODIUM CHLORIDE 0.9 % IV SOLN
50.0000 mg/m2 | Freq: Once | INTRAVENOUS | Status: AC
  Administered 2021-06-02: 126 mg via INTRAVENOUS
  Filled 2021-06-02: qty 21

## 2021-06-02 MED ORDER — HEPARIN SOD (PORK) LOCK FLUSH 100 UNIT/ML IV SOLN
500.0000 [IU] | Freq: Once | INTRAVENOUS | Status: AC | PRN
  Administered 2021-06-02: 500 [IU]

## 2021-06-02 MED ORDER — SODIUM CHLORIDE 0.9 % IV SOLN
10.0000 mg | Freq: Once | INTRAVENOUS | Status: AC
  Administered 2021-06-02: 10 mg via INTRAVENOUS
  Filled 2021-06-02: qty 10

## 2021-06-02 MED ORDER — DIPHENHYDRAMINE HCL 50 MG/ML IJ SOLN
25.0000 mg | Freq: Once | INTRAMUSCULAR | Status: AC
  Administered 2021-06-02: 25 mg via INTRAVENOUS
  Filled 2021-06-02: qty 1

## 2021-06-02 MED ORDER — SODIUM CHLORIDE 0.9 % IV SOLN: Freq: Once | INTRAVENOUS | Status: AC

## 2021-06-02 MED ORDER — SODIUM CHLORIDE 0.9% FLUSH
10.0000 mL | INTRAVENOUS | Status: DC | PRN
  Administered 2021-06-02: 10 mL

## 2021-06-02 MED ORDER — SODIUM CHLORIDE 0.9% FLUSH
10.0000 mL | Freq: Once | INTRAVENOUS | Status: AC
  Administered 2021-06-02: 10 mL

## 2021-06-02 MED ORDER — FAMOTIDINE IN NACL 20-0.9 MG/50ML-% IV SOLN
20.0000 mg | Freq: Once | INTRAVENOUS | Status: AC
  Administered 2021-06-02: 20 mg via INTRAVENOUS
  Filled 2021-06-02: qty 50

## 2021-06-02 NOTE — Patient Instructions (Signed)
[facility]  Discharge Instructions: ?Thank you for choosing [facility] to provide your oncology and hematology care.  ? ?If you have a lab appointment with the [facility], please go directly to the [facility] and check in at the registration area. ?  ?Wear comfortable clothing and clothing appropriate for easy access to any Portacath or PICC line.  ? ?We strive to give you quality time with your provider. You may need to reschedule your appointment if you arrive late (15 or more minutes).  Arriving late affects you and other patients whose appointments are after yours.  Also, if you miss three or more appointments without notifying the office, you may be dismissed from the clinic at the provider?s discretion.    ?  ?For prescription refill requests, have your pharmacy contact our office and allow 72 hours for refills to be completed.   ? ?Today you received the following chemotherapy and/or immunotherapy agents: Taxol    ?  ?To help prevent nausea and vomiting after your treatment, we encourage you to take your nausea medication as directed. ? ?BELOW ARE SYMPTOMS THAT SHOULD BE REPORTED IMMEDIATELY: ?*FEVER GREATER THAN 100.4 F (38 ?C) OR HIGHER ?*CHILLS OR SWEATING ?*NAUSEA AND VOMITING THAT IS NOT CONTROLLED WITH YOUR NAUSEA MEDICATION ?*UNUSUAL SHORTNESS OF BREATH ?*UNUSUAL BRUISING OR BLEEDING ?*URINARY PROBLEMS (pain or burning when urinating, or frequent urination) ?*BOWEL PROBLEMS (unusual diarrhea, constipation, pain near the anus) ?TENDERNESS IN MOUTH AND THROAT WITH OR WITHOUT PRESENCE OF ULCERS (sore throat, sores in mouth, or a toothache) ?UNUSUAL RASH, SWELLING OR PAIN  ?UNUSUAL VAGINAL DISCHARGE OR ITCHING  ? ?Items with * indicate a potential emergency and should be followed up as soon as possible or go to the Emergency Department if any problems should occur. ? ?Please show the CHEMOTHERAPY ALERT CARD or IMMUNOTHERAPY ALERT CARD at check-in to the  Emergency Department and triage nurse. ? ?Should you have questions after your visit or need to cancel or reschedule your appointment, please contact [facility]  Dept: [phone]  and follow the prompts.  Office hours are 8:00 a.m. to 4:30 p.m. Monday - Friday. Please note that voicemails left after 4:00 p.m. may not be returned until the following business day.  We are closed weekends and major holidays. You have access to a nurse at all times for urgent questions. Please call the main number to the clinic Dept: [phone] and follow the prompts. ? ? ?For any non-urgent questions, you may also contact your provider using MyChart. We now offer e-Visits for anyone 26 and older to request care online for non-urgent symptoms. For details visit [email or web address]. ?  ?Also download the MyChart app! Go to the app store, search "MyChart", open the app, select [facility], and log in with your MyChart username and password. ? ?Due to Covid, a mask is required upon entering the hospital/clinic. If you do not have a mask, one will be given to you upon arrival. For doctor visits, patients may have 1 support person aged 50 or older with them. For treatment visits, patients cannot have anyone with them due to current Covid guidelines and our immunocompromised population.  ? ?

## 2021-06-08 ENCOUNTER — Ambulatory Visit: Payer: BC Managed Care – PPO | Admitting: Physical Therapy

## 2021-06-08 DIAGNOSIS — M6281 Muscle weakness (generalized): Secondary | ICD-10-CM | POA: Diagnosis not present

## 2021-06-08 DIAGNOSIS — R279 Unspecified lack of coordination: Secondary | ICD-10-CM

## 2021-06-08 DIAGNOSIS — R293 Abnormal posture: Secondary | ICD-10-CM

## 2021-06-08 MED FILL — Dexamethasone Sodium Phosphate Inj 100 MG/10ML: INTRAMUSCULAR | Qty: 1 | Status: AC

## 2021-06-08 NOTE — Therapy (Signed)
?OUTPATIENT PHYSICAL THERAPY TREATMENT NOTE ? ? ?Patient Name: Evelyn [facility] ?MRN: [id number] ?DOB:February 03, 1985, 36 y.o., female ?Today's Date: 06/08/2021 ? ?PCP: [name], MD ?REFERRING PROVIDER: Wilber Bihari Cornett* ? ?END OF SESSION:  ? PT End of Session - 06/08/21 1536   ? ? Visit Number 7   ? Date for PT Re-Evaluation 07/28/21   ? Authorization Type BCBS   ? PT Start Time 1533   ? PT Stop Time 3875   ? PT Time Calculation (min) 36 min   ? Activity Tolerance Patient tolerated treatment well   ? Behavior During Therapy [facility] for tasks assessed/performed   ? ?  ?  ? ?  ? ? ? ? ? ?Past Medical History:  ?Diagnosis Date  ? Abnormal Pap smear 2012  ? colpo result benign  ? Cancer [facility])   ? Chlamydia   ? Family history of breast cancer 12/15/2020  ? History of pre-eclampsia   ? HPV (human papilloma virus) infection   ? Hyperemesis   ? Hypokalemia   ? Pregnancy induced hypertension   ? Ptyalism   ? Vaginal Pap smear, abnormal   ? ?Past Surgical History:  ?Procedure Laterality Date  ? COLPOSCOPY    ? IR IMAGING GUIDED PORT INSERTION  12/24/2020  ? WISDOM TOOTH EXTRACTION    ? ?Patient Active Problem List  ? Diagnosis Date Noted  ? Postpartum care following vaginal delivery 03/16/2021  ? Malignant neoplasm affecting pregnancy in third trimester 03/15/2021  ? Malignant neoplasm affecting pregnancy, antepartum, third trimester 03/13/2021  ? Port-A-Cath in place 12/31/2020  ? Genetic testing 12/27/2020  ? Malignant neoplasm of upper-outer quadrant of right breast in female, estrogen receptor positive ([place]) 12/15/2020  ? Family history of breast cancer 12/15/2020  ? Severe preeclampsia, third trimester 02/25/2019  ? Hyperemesis gravidarum 08/20/2018  ? Preeclampsia, third trimester 11/15/2015  ? Pre-eclampsia during pregnancy in third trimester, antepartum 11/15/2015  ? Preeclampsia 11/15/2015  ? Mild preeclampsia 11/12/2015  ? Hypokalemia, inadequate intake 06/17/2015  ? Ptyalism 05/18/2015  ? Chronic hypertension in  pregnancy 05/18/2015  ? Morbid obesity with BMI of 40.0-44.9, adult ([place]) 05/18/2015  ? Rh negative, maternal 05/18/2015  ? ? ?REFERRING DIAG: O9A.113 (ICD-10-CM) - Malignant neoplasm affecting pregnancy, antepartum, third trimester ?C50.411,Z17.0 (ICD-10-CM) - Malignant neoplasm of upper-outer quadrant of ? ?THERAPY DIAG:  ?Muscle weakness (generalized) ? ?Abnormal posture ? ?Unspecified lack of coordination ? ?PERTINENT HISTORY: Malignant neoplasm of upper-outer quadrant of right breast in female, estrogen receptor positive ([place]) ?Staging form: Breast, AJCC 8th Edition ?- Clinical: Stage IV (cT3, cN2, pM1, G2, ER+, PR-, HER2+) - Signed by Nicholas Lose, MD on 12/15/2020 ?Initial Diagnosis 12/08/20; staging 12/15/20; chemo Adjuvant AC q21d 122/22-02/23/21; genetic testing 12/29/20; chemo Patient is on Treatment Plan : BREAST AC q21 days / Trastuzumab + Pertuzumab + Weekly Paclitaxel q21d   03/25/21-present  ?Sexual abuse: No ? ?PRECAUTIONS: Other: postpartum (fourth baby 03/16/21) ? ?SUBJECTIVE: Pt reports she now only having small "trickles" of urine leakage when they occur, with sneezing/coughing with several in a row. No longer having leakage with just one sneeze or coughing.  ? ?PAIN:  ?Are you having pain? No ? ?OBJECTIVE:  ?  ?DIAGNOSTIC FINDINGS:  ?  ?  ?COGNITION: ?           Overall cognitive status: Within functional limits for tasks assessed              ?            ?SENSATION: ?  Light touch: Appears intact ?           Proprioception: Appears intact ?  ?MUSCLE LENGTH: ?Hamstrings and adductors limited by 25% bil ?  ?  ?  ?  ?POSTURE:  ?Rounded shoulders, posterior pelvic tilt ?  ?LUMBARAROM/PROM ?  ?Bil side bending and rotation limited by 25% all others WFL ?  ?LE ROM: ?Bil LE WFL ?  ?LE MMT: ?  ?Bil hip abduction 3+/5 all others 4/5 ?  ?PELVIC MMT: deferred per pt request 05/04/2021  ?  ?MMT   ?04/28/2021 06/01/2021   ?Vaginal   4/5; 8 reps; 6s  ?Internal Anal Sphincter     ?External Anal Sphincter      ?Puborectalis     ?Diastasis Recti No separation felt this date at rest or with shoulders lifted from table however pt did demonstrate difficulty with maintain a minimal crunch position    ?(Blank rows = not tested) ?  ?      PALPATION: ?  General  no TTP throughout but mild restrictions noted in bil lower abdominal region ?  ?              External Perineal Exam deferred ?              ?              Internal Pelvic Floor deferred ?  ?TONE: ?deferred ?  ?PROLAPSE: ?deferred ?  ?TODAY'S TREATMENT  ? ? ?06/08/2021: ?2x10 bridges with ball squeeze ?Sidelying hip abduction with ball press 4# ankle weight x10 each ?Cat/cows x10  ?20# cable squats 2x10 ?Pull downs 2x10 15# ?Unilateral stance on bosu ball x10 mini squats each leg ?All exercises cued for coordination of pelvic floor and breathing for decreased strain at pelvic floor for less leakage ?  ? ?06/01/2021: ?Manual: internal vaginal assessment consented to by pt today. ?Results above in chart.  Pt benefited from cues for  decreased glute activation and quickly able to correct. Pt also benefited from cues for decreased breath holding and able  to correct this as well.  ?2x10 pelvic floor contractions ?3F35 quick flicks ?X10 isometrics 6s ? ?05/25/2021: ?Elliptical  5 mins L2 for increased challenge at pelvic floor in upright position. Pt needed one rest breaks briefly at halfway point due to fatigue but no leakage. ?2x10 squats 20# ?2x10 deadlifts 20# ?Bosu step on/over x10 each leg ?Quick step over hurdle x10 each leg ? ?05/18/2021: ?Elliptical 4 mins L1 for increased challenge at pelvic floor in upright position.  Pt needed one rest breaks briefly at halfway point due to fatigue but no leakage. ?Sit to stand 10# coordinated with pelvic floor and breathing  ?Quad donkey kicks yellow band 2x10 ?Seated unilateal hip abduction yellow band 2x10  ?Mario pouches 4# bil x20 ?Hip hiking on blue foam x20 ?3# 2x10 ankle weighted hip flexion in standing ? ?05/11/2021: ?Nustep 5  mins L3  ?Bridges with ball squeezes 2x10 ?Bird dog x10 ?Opp arm/hand press x10 each ?X10 Sit to stand 15# ?Squats x10 15# ?300' farmer's carry 10# and 15# cues needed for midline stance to decreased rt side lean ?Bosu unilaterally x15 squats,  x10 forward weight shifts with lead leg on bosu ? ?05/04/2021: ?X20 diaphragmatic breathing in supine ?Ball squeezes 2x10 with exhale ?Sidelying hip abduction with ball press 2x10 ? Quad TA activations 2x10 ?Seated bil shoulder horizontal abductions red band 2x10 with TA activation exhale ?Sit to stand with exhale and pelvic floor contract/relax ?Nustep  5 mins L2 for improved tolerance to activity core activation  and bil leg strengthening ? ?EVAL 04/28/2021: Examination completed, findings reviewed, pt educated on POC, HEP, urge drill, knack method Pt motivated to participate in PT and agreeable to attempt recommendations.  ?  ?  ?  ?PATIENT EDUCATION:  ?Education details: ZB2M_0 H ?Person educated: Patient ?Education method: Explanation, Demonstration, Tactile cues, Verbal cues, and Handouts ?Education comprehension: verbalized understanding and returned demonstration ?  ?  ?HOME EXERCISE PROGRAM: ?ZB2M_1 H ?  ?ASSESSMENT: ?  ?CLINICAL IMPRESSION: ?Patient pleased with progress so far and reports she feels she is still improving.Pt session focused on hip and core strengthening with coordination of breathing and pelvic floor to decrease stain at pelvic floor and decreased leakage. Pt denied leakage throughout session and reports she is pleased with progress, currently 95% better per pt as she is only having leakage with several coughs or sneezes in a row and will have tiny about at last one or two. Pt would benefit from additional PT to further address deficits.   ?  ?  ?OBJECTIVE IMPAIRMENTS decreased coordination, decreased endurance, decreased mobility, decreased strength, increased fascial restrictions, impaired flexibility, improper body mechanics, and postural  dysfunction.  ?  ?ACTIVITY LIMITATIONS community activity and yard work.  ?  ?PERSONAL FACTORS Past/current experiences, Time since onset of injury/illness/exacerbation, and 3+ comorbidities: x4 vaginal births, curre

## 2021-06-09 ENCOUNTER — Inpatient Hospital Stay: Payer: BC Managed Care – PPO

## 2021-06-09 ENCOUNTER — Inpatient Hospital Stay (HOSPITAL_BASED_OUTPATIENT_CLINIC_OR_DEPARTMENT_OTHER): Payer: BC Managed Care – PPO | Admitting: Hematology and Oncology

## 2021-06-09 ENCOUNTER — Encounter: Payer: Self-pay | Admitting: *Deleted

## 2021-06-09 ENCOUNTER — Other Ambulatory Visit: Payer: Self-pay

## 2021-06-09 DIAGNOSIS — C50411 Malignant neoplasm of upper-outer quadrant of right female breast: Secondary | ICD-10-CM

## 2021-06-09 DIAGNOSIS — Z170 Estrogen receptor positive status [ER+]: Secondary | ICD-10-CM | POA: Diagnosis not present

## 2021-06-09 DIAGNOSIS — Z95828 Presence of other vascular implants and grafts: Secondary | ICD-10-CM

## 2021-06-09 DIAGNOSIS — Z5112 Encounter for antineoplastic immunotherapy: Secondary | ICD-10-CM | POA: Diagnosis not present

## 2021-06-09 LAB — CMP (CANCER CENTER ONLY)
ALT: 40 U/L (ref 0–44)
AST: 23 U/L (ref 15–41)
Albumin: 4.1 g/dL (ref 3.5–5.0)
Alkaline Phosphatase: 110 U/L (ref 38–126)
Anion gap: 7 (ref 5–15)
BUN: 13 mg/dL (ref 6–20)
CO2: 28 mmol/L (ref 22–32)
Calcium: 9.6 mg/dL (ref 8.9–10.3)
Chloride: 104 mmol/L (ref 98–111)
Creatinine: 0.9 mg/dL (ref 0.44–1.00)
GFR, Estimated: >60 mL/min (ref >60)
Glucose, Bld: 121 mg/dL — ABNORMAL HIGH (ref 70–99)
Potassium: 3.8 mmol/L (ref 3.5–5.1)
Sodium: 139 mmol/L (ref 135–145)
Total Bilirubin: 0.4 mg/dL (ref 0.3–1.2)
Total Protein: 7.8 g/dL (ref 6.5–8.1)

## 2021-06-09 LAB — CBC WITH DIFFERENTIAL (CANCER CENTER ONLY)
Abs Immature Granulocytes: 0.05 10*3/uL (ref 0.00–0.07)
Basophils Absolute: 0 10*3/uL (ref 0.0–0.1)
Basophils Relative: 1 %
Eosinophils Absolute: 0.1 10*3/uL (ref 0.0–0.5)
Eosinophils Relative: 1 %
HCT: 33.5 % — ABNORMAL LOW (ref 36.0–46.0)
Hemoglobin: 11 g/dL — ABNORMAL LOW (ref 12.0–15.0)
Immature Granulocytes: 1 %
Lymphocytes Relative: 13 %
Lymphs Abs: 0.6 10*3/uL — ABNORMAL LOW (ref 0.7–4.0)
MCH: 26.8 pg (ref 26.0–34.0)
MCHC: 32.8 g/dL (ref 30.0–36.0)
MCV: 81.5 fL (ref 80.0–100.0)
Monocytes Absolute: 0.4 10*3/uL (ref 0.1–1.0)
Monocytes Relative: 9 %
Neutro Abs: 3.4 10*3/uL (ref 1.7–7.7)
Neutrophils Relative %: 75 %
Platelet Count: 352 10*3/uL (ref 150–400)
RBC: 4.11 MIL/uL (ref 3.87–5.11)
RDW: 16.6 % — ABNORMAL HIGH (ref 11.5–15.5)
WBC Count: 4.5 10*3/uL (ref 4.0–10.5)
nRBC: 0 % (ref 0.0–0.2)

## 2021-06-09 MED ORDER — SODIUM CHLORIDE 0.9% FLUSH: 10.0000 mL | INTRAVENOUS | Status: DC | PRN

## 2021-06-09 MED ORDER — FAMOTIDINE IN NACL 20-0.9 MG/50ML-% IV SOLN
20.0000 mg | Freq: Once | INTRAVENOUS | Status: AC
  Administered 2021-06-09: 20 mg via INTRAVENOUS
  Filled 2021-06-09: qty 50

## 2021-06-09 MED ORDER — SODIUM CHLORIDE 0.9 % IV SOLN
10.0000 mg | Freq: Once | INTRAVENOUS | Status: AC
  Administered 2021-06-09: 10 mg via INTRAVENOUS
  Filled 2021-06-09: qty 10

## 2021-06-09 MED ORDER — SODIUM CHLORIDE 0.9 % IV SOLN: Freq: Once | INTRAVENOUS | Status: AC

## 2021-06-09 MED ORDER — SODIUM CHLORIDE 0.9 % IV SOLN
50.0000 mg/m2 | Freq: Once | INTRAVENOUS | Status: AC
  Administered 2021-06-09: 126 mg via INTRAVENOUS
  Filled 2021-06-09: qty 21

## 2021-06-09 MED ORDER — HEPARIN SOD (PORK) LOCK FLUSH 100 UNIT/ML IV SOLN: 500.0000 [IU] | Freq: Once | INTRAVENOUS | Status: DC | PRN

## 2021-06-09 MED ORDER — SODIUM CHLORIDE 0.9% FLUSH
10.0000 mL | Freq: Once | INTRAVENOUS | Status: AC
  Administered 2021-06-09: 10 mL

## 2021-06-09 MED ORDER — DIPHENHYDRAMINE HCL 50 MG/ML IJ SOLN
25.0000 mg | Freq: Once | INTRAMUSCULAR | Status: AC
  Administered 2021-06-09: 25 mg via INTRAVENOUS
  Filled 2021-06-09: qty 1

## 2021-06-09 NOTE — Patient Instructions (Signed)
[facility] ONCOLOGY  Discharge Instructions: Thank you for choosing [facility] to provide your oncology and hematology care.   If you have a lab appointment with the [facility], please go directly to the [facility] and check in at the registration area.   Wear comfortable clothing and clothing appropriate for easy access to any Portacath or PICC line.   We strive to give you quality time with your provider. You may need to reschedule your appointment if you arrive late (15 or more minutes).  Arriving late affects you and other patients whose appointments are after yours.  Also, if you miss three or more appointments without notifying the office, you may be dismissed from the clinic at the provider's discretion.      For prescription refill requests, have your pharmacy contact our office and allow 72 hours for refills to be completed.    Today you received the following chemotherapy and/or immunotherapy agents: Taxol      To help prevent nausea and vomiting after your treatment, we encourage you to take your nausea medication as directed.  BELOW ARE SYMPTOMS THAT SHOULD BE REPORTED IMMEDIATELY: *FEVER GREATER THAN 100.4 F (38 C) OR HIGHER *CHILLS OR SWEATING *NAUSEA AND VOMITING THAT IS NOT CONTROLLED WITH YOUR NAUSEA MEDICATION *UNUSUAL SHORTNESS OF BREATH *UNUSUAL BRUISING OR BLEEDING *URINARY PROBLEMS (pain or burning when urinating, or frequent urination) *BOWEL PROBLEMS (unusual diarrhea, constipation, pain near the anus) TENDERNESS IN MOUTH AND THROAT WITH OR WITHOUT PRESENCE OF ULCERS (sore throat, sores in mouth, or a toothache) UNUSUAL RASH, SWELLING OR PAIN  UNUSUAL VAGINAL DISCHARGE OR ITCHING   Items with * indicate a potential emergency and should be followed up as soon as possible or go to the Emergency Department if any problems should occur.  Please show the CHEMOTHERAPY ALERT CARD or IMMUNOTHERAPY ALERT CARD at check-in to the  Emergency Department and triage nurse.  Should you have questions after your visit or need to cancel or reschedule your appointment, please contact [facility]  Dept: [phone]  and follow the prompts.  Office hours are 8:00 a.m. to 4:30 p.m. Monday - Friday. Please note that voicemails left after 4:00 p.m. may not be returned until the following business day.  We are closed weekends and major holidays. You have access to a nurse at all times for urgent questions. Please call the main number to the clinic Dept: [phone] and follow the prompts.   For any non-urgent questions, you may also contact your provider using MyChart. We now offer e-Visits for anyone 13 and older to request care online for non-urgent symptoms. For details visit [email or web address].   Also download the MyChart app! Go to the app store, search "MyChart", open the app, select [facility], and log in with your MyChart username and password.  Due to Covid, a mask is required upon entering the hospital/clinic. If you do not have a mask, one will be given to you upon arrival. For doctor visits, patients may have 1 support person aged 18 or older with them. For treatment visits, patients cannot have anyone with them due to current Covid guidelines and our immunocompromised population.

## 2021-06-09 NOTE — Assessment & Plan Note (Signed)
12/08/2020:21-week pregnancy: MRI at [facility] noncontrast revealed right axillary mass 3.7 cm, subpectoral lymph nodes, left supraclavicular lymph node, multiple level 2 and 3 lymph nodes in the right axilla, 2 breast masses 1.1 cm largest size. 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 Neoadjuvant chemotherapy with Adriamycin and Cytoxan every 3 weeks x4started 12/24/2020 completed 02/23/2021 2. delivery of the baby 3. Continue neoadjuvant therapy with Taxol Herceptin and Perjeta 4. Mastectomy with ALND  5. Adjuvant radiation therapy (radiation oncology is not certain of benefits of radiation given PET/CT findings) 6. Adjuvant antiestrogen therapy with ovarian function suppression Genetic testing ---------------------------------------------------------------------------------------------------------------------------------- Evelyn Marshall has delivered a healthy boy(Evelyn [facility])  Current treatment:Cycle12Taxol(every 3 weekHerceptin Perjeta) 1. She tolerated Herceptin and Perjeta extremely well. She hada few episodes of diarrhea which were controlled with Imodium 2.   Leukopenia: ANC 3.8(now at a reduced dosage ofTaxol 50 mg per metered square)   breast MRI  06/02/2021: Significant interval improvement in the right without any remaining masses or non-mass enhancement.  Improvement in right-sided adenopathy  Return to clinic after surgery.  Further adjuvant anti-HER2 therapy could be with Herceptin Perjeta versus Kadcyla.

## 2021-06-10 ENCOUNTER — Telehealth: Payer: Self-pay | Admitting: Hematology and Oncology

## 2021-06-10 NOTE — Telephone Encounter (Signed)
Per 5/18 los called pt and left message with appointment details  call back number was left

## 2021-06-15 ENCOUNTER — Ambulatory Visit: Payer: BC Managed Care – PPO | Admitting: Physical Therapy

## 2021-06-15 DIAGNOSIS — R279 Unspecified lack of coordination: Secondary | ICD-10-CM

## 2021-06-15 DIAGNOSIS — M6281 Muscle weakness (generalized): Secondary | ICD-10-CM | POA: Diagnosis not present

## 2021-06-15 NOTE — Therapy (Signed)
OUTPATIENT PHYSICAL THERAPY TREATMENT NOTE   Patient Name: Evelyn Marshall MRN: [id number] DOB:06-04-1985, 36 y.o., female Today's Date: 06/15/2021  PCP: [name], MD REFERRING PROVIDER: Causey, [place]:   PT End of Session - 06/15/21 1609     Visit Number 8    Date for PT Re-Evaluation 07/28/21    Authorization Type BCBS    PT Start Time 1535   pt arrival time   PT Stop Time 1609    PT Time Calculation (min) 34 min    Activity Tolerance Patient tolerated treatment well    Behavior During Therapy [facility] for tasks assessed/performed                 Past Medical History:  Diagnosis Date   Abnormal Pap smear 2012   colpo result benign   Cancer ([place])    Chlamydia    Family history of breast cancer 12/15/2020   History of pre-eclampsia    HPV (human papilloma virus) infection    Hyperemesis    Hypokalemia    Pregnancy induced hypertension    Ptyalism    Vaginal Pap smear, abnormal    Past Surgical History:  Procedure Laterality Date   COLPOSCOPY     IR IMAGING GUIDED PORT INSERTION  12/24/2020   WISDOM TOOTH EXTRACTION     Patient Active Problem List   Diagnosis Date Noted   Postpartum care following vaginal delivery 03/16/2021   Malignant neoplasm affecting pregnancy in third trimester 03/15/2021   Malignant neoplasm affecting pregnancy, antepartum, third trimester 03/13/2021   Port-A-Cath in place 12/31/2020   Genetic testing 12/27/2020   Malignant neoplasm of upper-outer quadrant of right breast in female, estrogen receptor positive ([place]) 12/15/2020   Family history of breast cancer 12/15/2020   Severe preeclampsia, third trimester 02/25/2019   Hyperemesis gravidarum 08/20/2018   Preeclampsia, third trimester 11/15/2015   Pre-eclampsia during pregnancy in third trimester, antepartum 11/15/2015   Preeclampsia 11/15/2015   Mild preeclampsia 11/12/2015   Hypokalemia, inadequate intake 06/17/2015   Ptyalism 05/18/2015   Chronic  hypertension in pregnancy 05/18/2015   Morbid obesity with BMI of 40.0-44.9, adult ([place]) 05/18/2015   Rh negative, maternal 05/18/2015    REFERRING DIAG: O9A.113 (ICD-10-CM) - Malignant neoplasm affecting pregnancy, antepartum, third trimester C50.411,Z17.0 (ICD-10-CM) - Malignant neoplasm of upper-outer quadrant of  THERAPY DIAG:  Muscle weakness (generalized)  Unspecified lack of coordination  PERTINENT HISTORY: Malignant neoplasm of upper-outer quadrant of right breast in female, estrogen receptor positive ([place]) Staging form: Breast, AJCC 8th Edition - Clinical: Stage IV (cT3, cN2, pM1, G2, ER+, PR-, HER2+) - Signed by Nicholas Lose, MD on 12/15/2020 Initial Diagnosis 12/08/20; staging 12/15/20; chemo Adjuvant AC q21d 122/22-02/23/21; genetic testing 12/29/20; chemo Patient is on Treatment Plan : BREAST AC q21 days / Trastuzumab + Pertuzumab + Weekly Paclitaxel q21d   03/25/21-present  Sexual abuse: No  PRECAUTIONS: Other: postpartum (fourth baby 03/16/21)  SUBJECTIVE: Pt reports she now only having small "trickles" of urine leakage when they occur, with sneezing/coughing with several in a row. No longer having leakage with just one sneeze or coughing.   PAIN:  Are you having pain? No  OBJECTIVE:    DIAGNOSTIC FINDINGS:      COGNITION:            Overall cognitive status: Within functional limits for tasks assessed  SENSATION:            Light touch: Appears intact            Proprioception: Appears intact   MUSCLE LENGTH: Hamstrings and adductors limited by 25% bil     POSTURE:  Rounded shoulders, posterior pelvic tilt   LUMBARAROM/PROM   Bil side bending and rotation limited by 25% all others WFL   LE ROM: Bil LE WFL   LE MMT:   Bil hip abduction 3+/5 all others 4/5   PELVIC MMT: deferred per pt request 05/04/2021    MMT   04/28/2021 06/01/2021   Vaginal   4/5; 8 reps; 6s  Internal Anal Sphincter     External Anal Sphincter      Puborectalis     Diastasis Recti No separation felt this date at rest or with shoulders lifted from table however pt did demonstrate difficulty with maintain a minimal crunch position    (Blank rows = not tested)         PALPATION:   General  no TTP throughout but mild restrictions noted in bil lower abdominal region                 External Perineal Exam deferred                             Internal Pelvic Floor deferred   TONE: deferred   PROLAPSE: deferred   TODAY'S TREATMENT   06/15/2021: Elliptical 5 mins L2 for increased challenge in standing at pelvic floor with pt reporting no leakage or feeling of leakage symptoms.  Squats 2x10 15# Hip hikes with one on bosu ball x10 each Single leg Sit to stand from mat table with blue mat for boost x10 each Opp hand/knee ball press 2x10 each in hooklying  Bird dogs x10 each  All exercises cued for coordination of pelvic floor and breathing for decreased strain at pelvic floor for less leakage  06/08/2021: 2x10 bridges with ball squeeze Sidelying hip abduction with ball press 4# ankle weight x10 each Cat/cows x10  20# cable squats 2x10 Pull downs 2x10 15# Unilateral stance on bosu ball x10 mini squats each leg All exercises cued for coordination of pelvic floor and breathing for decreased strain at pelvic floor for less leakage    06/01/2021: Manual: internal vaginal assessment consented to by pt today. Results above in chart.  Pt benefited from cues for  decreased glute activation and quickly able to correct. Pt also benefited from cues for decreased breath holding and able  to correct this as well.  2x10 pelvic floor contractions 2H47 quick flicks M54 isometrics 6s  05/25/2021: Elliptical  5 mins L2 for increased challenge at pelvic floor in upright position. Pt needed one rest breaks briefly at halfway point due to fatigue but no leakage. 2x10 squats 20# 2x10 deadlifts 20# Bosu step on/over x10 each leg Quick step over  hurdle x10 each leg  05/18/2021: Elliptical 4 mins L1 for increased challenge at pelvic floor in upright position.  Pt needed one rest breaks briefly at halfway point due to fatigue but no leakage. Sit to stand 10# coordinated with pelvic floor and breathing  Quad donkey kicks yellow band 2x10 Seated unilateal hip abduction yellow band 2x10  Mario pouches 4# bil x20 Hip hiking on blue foam x20 3# 2x10 ankle weighted hip flexion in standing  05/11/2021: Nustep 5 mins L3  Bridges with ball squeezes 2x10  Bird dog x10 Opp arm/hand press x10 each X10 Sit to stand 15# Squats x10 15# 300' farmer's carry 10# and 15# cues needed for midline stance to decreased rt side lean Bosu unilaterally x15 squats,  x10 forward weight shifts with lead leg on bosu  05/04/2021: X20 diaphragmatic breathing in supine Ball squeezes 2x10 with exhale Sidelying hip abduction with ball press 2x10  Quad TA activations 2x10 Seated bil shoulder horizontal abductions red band 2x10 with TA activation exhale Sit to stand with exhale and pelvic floor contract/relax Nustep 5 mins L2 for improved tolerance to activity core activation  and bil leg strengthening  EVAL 04/28/2021: Examination completed, findings reviewed, pt educated on POC, HEP, urge drill, knack method Pt motivated to participate in PT and agreeable to attempt recommendations.        PATIENT EDUCATION:  Education details: [id number] Person educated: Patient Education method: Consulting civil engineer, Demonstration, Corporate treasurer cues, Verbal cues, and Handouts Education comprehension: verbalized understanding and returned demonstration     HOME EXERCISE PROGRAM: [id number]   ASSESSMENT:   CLINICAL IMPRESSION: Patient pleased with progress so far and reports she feels she is still improving with now only leaking urine with a row of several sneezes or coughs and leaks at last couple. Pt session focused on hip and core strengthening with coordination of breathing and pelvic  floor to decrease stain at pelvic floor and decreased leakage. Pt denied leakage throughout session. Pt would benefit from additional PT to further address deficits.       OBJECTIVE IMPAIRMENTS decreased coordination, decreased endurance, decreased mobility, decreased strength, increased fascial restrictions, impaired flexibility, improper body mechanics, and postural dysfunction.    ACTIVITY LIMITATIONS community activity and yard work.    PERSONAL FACTORS Past/current experiences, Time since onset of injury/illness/exacerbation, and 3+ comorbidities: x4 vaginal births, currently undergoing CA treatments for breast CA  are also affecting patient's functional outcome.      REHAB POTENTIAL: Good   CLINICAL DECISION MAKING: Unstable/unpredictable   EVALUATION COMPLEXITY: High     GOALS: Goals reviewed with patient? Yes   SHORT TERM GOALS: Target date: 05/26/2021   Pt to be I with HEP. Baseline: Goal status: MET   2.  Pt to demonstrate at least 3/5 pelvic floor strength for improved ability to hold urine for decreased leakage.  Baseline:  Goal status: MET   3.  Pt to report decrease in leakage to no more than once per day. Baseline:  Goal status: MET   4.  Pt to recall improved voiding mechanics for decreased need for straining for bowel movements without cues I.  Baseline:  Goal status: MET   LONG TERM GOALS: Target date: 07/28/2021   Pt to be I with advanced HEP Baseline:  Goal status: MET   2.  Pt to demonstrate at least 5/5 bil hip strength for improved pelvic stability and functional squats without leakage.   Baseline:  Goal status: on going   3.  Pt to demonstrate at least 4/5 pelvic floor strength for improved ability to hold urine for decreased leakage.  Baseline:  Goal status: MET   4.   Pt to report decrease in leakage to no more than once per week for improved symptoms. Baseline:  Goal status: on going   5.  Pt to tolerate activity such as walking for 30  mins without leakage for improved mobility and decreased symptoms.  Baseline:  Goal status: on going   6.  Pt to demonstrate improved coordination of breathing mechanics, core activation  and pelvic floor mobility with functional tasks to decrease leakage symptoms at least 75% of the time.  Baseline:  Goal status: met    PLAN: PT FREQUENCY: 1x/week   PT DURATION:  10 sessions   PLANNED INTERVENTIONS: Therapeutic exercises, Therapeutic activity, Neuromuscular re-education, Patient/Family education, Joint mobilization, Spinal mobilization, Cryotherapy, Moist heat, Manual lymph drainage, scar mobilization, Taping, Biofeedback, and Manual therapy   PLAN FOR NEXT SESSION: coordination of pelvic floor with breathing, core activation   Stacy Gardner, PT, DPT 05/24/234:11 PM

## 2021-06-16 ENCOUNTER — Encounter: Payer: Self-pay | Admitting: *Deleted

## 2021-06-22 ENCOUNTER — Ambulatory Visit: Payer: BC Managed Care – PPO | Admitting: Physical Therapy

## 2021-06-22 ENCOUNTER — Encounter: Payer: Self-pay | Admitting: Hematology and Oncology

## 2021-06-22 ENCOUNTER — Encounter: Payer: Self-pay | Admitting: *Deleted

## 2021-06-22 DIAGNOSIS — M6281 Muscle weakness (generalized): Secondary | ICD-10-CM | POA: Diagnosis not present

## 2021-06-22 DIAGNOSIS — R279 Unspecified lack of coordination: Secondary | ICD-10-CM

## 2021-06-22 DIAGNOSIS — R293 Abnormal posture: Secondary | ICD-10-CM

## 2021-06-22 NOTE — Therapy (Signed)
OUTPATIENT PHYSICAL THERAPY TREATMENT NOTE   Patient Name: Evelyn Marshall MRN: [id number] DOB:May 22, 1985, 36 y.o., female Today's Date: 06/22/2021  PCP: [name], MD REFERRING PROVIDER: Causey, [place]:   PT End of Session - 06/22/21 1558     Visit Number 9    Date for PT Re-Evaluation 07/28/21    Authorization Type BCBS    PT Start Time 1535    PT Stop Time 1605    PT Time Calculation (min) 30 min    Activity Tolerance Patient tolerated treatment well    Behavior During Therapy [facility] for tasks assessed/performed                  Past Medical History:  Diagnosis Date   Abnormal Pap smear 2012   colpo result benign   Cancer ([place])    Chlamydia    Family history of breast cancer 12/15/2020   History of pre-eclampsia    HPV (human papilloma virus) infection    Hyperemesis    Hypokalemia    Pregnancy induced hypertension    Ptyalism    Vaginal Pap smear, abnormal    Past Surgical History:  Procedure Laterality Date   COLPOSCOPY     IR IMAGING GUIDED PORT INSERTION  12/24/2020   WISDOM TOOTH EXTRACTION     Patient Active Problem List   Diagnosis Date Noted   Postpartum care following vaginal delivery 03/16/2021   Malignant neoplasm affecting pregnancy in third trimester 03/15/2021   Malignant neoplasm affecting pregnancy, antepartum, third trimester 03/13/2021   Port-A-Cath in place 12/31/2020   Genetic testing 12/27/2020   Malignant neoplasm of upper-outer quadrant of right breast in female, estrogen receptor positive ([place]) 12/15/2020   Family history of breast cancer 12/15/2020   Severe preeclampsia, third trimester 02/25/2019   Hyperemesis gravidarum 08/20/2018   Preeclampsia, third trimester 11/15/2015   Pre-eclampsia during pregnancy in third trimester, antepartum 11/15/2015   Preeclampsia 11/15/2015   Mild preeclampsia 11/12/2015   Hypokalemia, inadequate intake 06/17/2015   Ptyalism 05/18/2015   Chronic hypertension in  pregnancy 05/18/2015   Morbid obesity with BMI of 40.0-44.9, adult ([place]) 05/18/2015   Rh negative, maternal 05/18/2015    REFERRING DIAG: O9A.113 (ICD-10-CM) - Malignant neoplasm affecting pregnancy, antepartum, third trimester C50.411,Z17.0 (ICD-10-CM) - Malignant neoplasm of upper-outer quadrant of  THERAPY DIAG:  Muscle weakness (generalized)  Unspecified lack of coordination  Abnormal posture  PERTINENT HISTORY: Malignant neoplasm of upper-outer quadrant of right breast in female, estrogen receptor positive ([facility]) Staging form: Breast, AJCC 8th Edition - Clinical: Stage IV (cT3, cN2, pM1, G2, ER+, PR-, HER2+) - Signed by Nicholas Lose, MD on 12/15/2020 Initial Diagnosis 12/08/20; staging 12/15/20; chemo Adjuvant AC q21d 122/22-02/23/21; genetic testing 12/29/20; chemo Patient is on Treatment Plan : BREAST AC q21 days / Trastuzumab + Pertuzumab + Weekly Paclitaxel q21d   03/25/21-present  Sexual abuse: No  PRECAUTIONS: Other: postpartum (fourth baby 03/16/21)  SUBJECTIVE: Pt reports she only has a leakage instances with a row of several sneezes or coughs but very small amount. Pt reports she also now feels less fatigue and able to do more at home/grocery shopping with energy and tolerance to activity in general and feels like she is stronger.   PAIN:  Are you having pain? No  OBJECTIVE:    DIAGNOSTIC FINDINGS:      COGNITION:            Overall cognitive status: Within functional limits for tasks assessed  SENSATION:            Light touch: Appears intact            Proprioception: Appears intact   MUSCLE LENGTH: Hamstrings and adductors limited by 25% bil     POSTURE:  Rounded shoulders, posterior pelvic tilt   LUMBARAROM/PROM   Bil side bending and rotation limited by 25% all others WFL   LE ROM: Bil LE WFL   LE MMT:   Bil hip abduction 3+/5 all others 4/5   PELVIC MMT: deferred per pt request 05/04/2021    MMT   04/28/2021 06/01/2021    Vaginal   4/5; 8 reps; 6s  Internal Anal Sphincter     External Anal Sphincter     Puborectalis     Diastasis Recti No separation felt this date at rest or with shoulders lifted from table however pt did demonstrate difficulty with maintain a minimal crunch position    (Blank rows = not tested)         PALPATION:   General  no TTP throughout but mild restrictions noted in bil lower abdominal region                 External Perineal Exam deferred                             Internal Pelvic Floor deferred   TONE: deferred   PROLAPSE: deferred   TODAY'S TREATMENT   06/22/2021: 2x10 15# squats  Bosu ball with one leg on bosu ball for x10 squats each side Hip hikes with one leg on bosu ball 2x10    06/15/2021: Elliptical 5 mins L2 for increased challenge in standing at pelvic floor with pt reporting no leakage or feeling of leakage symptoms.  Squats 2x10 15# Hip hikes with one on bosu ball x10 each Single leg Sit to stand from mat table with blue mat for boost x10 each Opp hand/knee ball press 2x10 each in hooklying  Bird dogs x10 each  All exercises cued for coordination of pelvic floor and breathing for decreased strain at pelvic floor for less leakage  06/08/2021: 2x10 bridges with ball squeeze Sidelying hip abduction with ball press 4# ankle weight x10 each Cat/cows x10  20# cable squats 2x10 Pull downs 2x10 15# Unilateral stance on bosu ball x10 mini squats each leg All exercises cued for coordination of pelvic floor and breathing for decreased strain at pelvic floor for less leakage    06/01/2021: Manual: internal vaginal assessment consented to by pt today. Results above in chart.  Pt benefited from cues for  decreased glute activation and quickly able to correct. Pt also benefited from cues for decreased breath holding and able  to correct this as well.  2x10 pelvic floor contractions 8T25 quick flicks Q98 isometrics 6s  05/25/2021: Elliptical  5 mins L2 for  increased challenge at pelvic floor in upright position. Pt needed one rest breaks briefly at halfway point due to fatigue but no leakage. 2x10 squats 20# 2x10 deadlifts 20# Bosu step on/over x10 each leg Quick step over hurdle x10 each leg  05/18/2021: Elliptical 4 mins L1 for increased challenge at pelvic floor in upright position.  Pt needed one rest breaks briefly at halfway point due to fatigue but no leakage. Sit to stand 10# coordinated with pelvic floor and breathing  Quad donkey kicks yellow band 2x10 Seated unilateal hip abduction yellow band 2x10  Freida Busman  pouches 4# bil x20 Hip hiking on blue foam x20 3# 2x10 ankle weighted hip flexion in standing  05/11/2021: Nustep 5 mins L3  Bridges with ball squeezes 2x10 Bird dog x10 Opp arm/hand press x10 each X10 Sit to stand 15# Squats x10 15# 300' farmer's carry 10# and 15# cues needed for midline stance to decreased rt side lean Bosu unilaterally x15 squats,  x10 forward weight shifts with lead leg on bosu  05/04/2021: X20 diaphragmatic breathing in supine Ball squeezes 2x10 with exhale Sidelying hip abduction with ball press 2x10  Quad TA activations 2x10 Seated bil shoulder horizontal abductions red band 2x10 with TA activation exhale Sit to stand with exhale and pelvic floor contract/relax Nustep 5 mins L2 for improved tolerance to activity core activation  and bil leg strengthening  EVAL 04/28/2021: Examination completed, findings reviewed, pt educated on POC, HEP, urge drill, knack method Pt motivated to participate in PT and agreeable to attempt recommendations.        PATIENT EDUCATION:  Education details: [id number] H Person educated: Patient Education method: Explanation, Demonstration, Tactile cues, Verbal cues, and Handouts Education comprehension: verbalized understanding and returned demonstration     HOME EXERCISE PROGRAM: [id number]   ASSESSMENT:   CLINICAL IMPRESSION: Patient pleased with progress so far and  reports she feels she is still improving with now only leaking urine with a row of several sneezes or coughs and leaks at last couple. Pt session focused on hip and core strengthening with coordination of breathing and pelvic floor to decrease stain at pelvic floor and decreased leakage. Pt denied leakage throughout session. Pt would benefit from additional PT to further address deficits.       OBJECTIVE IMPAIRMENTS decreased coordination, decreased endurance, decreased mobility, decreased strength, increased fascial restrictions, impaired flexibility, improper body mechanics, and postural dysfunction.    ACTIVITY LIMITATIONS community activity and yard work.    PERSONAL FACTORS Past/current experiences, Time since onset of injury/illness/exacerbation, and 3+ comorbidities: x4 vaginal births, currently undergoing CA treatments for breast CA  are also affecting patient's functional outcome.      REHAB POTENTIAL: Good   CLINICAL DECISION MAKING: Unstable/unpredictable   EVALUATION COMPLEXITY: High     GOALS: Goals reviewed with patient? Yes   SHORT TERM GOALS: Target date: 05/26/2021   Pt to be I with HEP. Baseline: Goal status: MET   2.  Pt to demonstrate at least 3/5 pelvic floor strength for improved ability to hold urine for decreased leakage.  Baseline:  Goal status: MET   3.  Pt to report decrease in leakage to no more than once per day. Baseline:  Goal status: MET   4.  Pt to recall improved voiding mechanics for decreased need for straining for bowel movements without cues I.  Baseline:  Goal status: MET   LONG TERM GOALS: Target date: 07/28/2021   Pt to be I with advanced HEP Baseline:  Goal status: MET   2.  Pt to demonstrate at least 5/5 bil hip strength for improved pelvic stability and functional squats without leakage.   Baseline:  Goal status: met   3.  Pt to demonstrate at least 4/5 pelvic floor strength for improved ability to hold urine for decreased  leakage.  Baseline:  Goal status: MET   4.   Pt to report decrease in leakage to no more than once per week for improved symptoms. Baseline:  Goal status: on going (now 1-2x wk)   5.  Pt to tolerate activity such as walking  for 30 mins without leakage for improved mobility and decreased symptoms.  Baseline:  Goal status: met   6.  Pt to demonstrate improved coordination of breathing mechanics, core activation and pelvic floor mobility with functional tasks to decrease leakage symptoms at least 75% of the time.  Baseline:  Goal status: met    PLAN: PT FREQUENCY: 1x/week   PT DURATION:  10 sessions   PLANNED INTERVENTIONS: Therapeutic exercises, Therapeutic activity, Neuromuscular re-education, Patient/Family education, Joint mobilization, Spinal mobilization, Cryotherapy, Moist heat, Manual lymph drainage, scar mobilization, Taping, Biofeedback, and Manual therapy   PLAN FOR NEXT SESSION: coordination of pelvic floor with breathing, core activation   PHYSICAL THERAPY DISCHARGE SUMMARY  Visits from Start of Care: 9  Current functional level related to goals / functional outcomes: Pt reports she has from 1-2 leakages per week on average and goal for no more than 1. Other than this all goals met.    Remaining deficits: Will have a very small amount of leakage with strong series of sneezes or coughs.    Education / Equipment: HEP   Patient agrees to discharge. Patient goals were partially met. Patient is being discharged due to being pleased with the current functional level.   Stacy Gardner, PT, DPT 05/31/234:05 PM

## 2021-06-23 ENCOUNTER — Encounter: Payer: Self-pay | Admitting: Hematology and Oncology

## 2021-06-23 ENCOUNTER — Ambulatory Visit (HOSPITAL_COMMUNITY): Payer: BC Managed Care – PPO | Attending: Cardiology

## 2021-06-23 ENCOUNTER — Telehealth: Payer: Self-pay | Admitting: *Deleted

## 2021-06-23 DIAGNOSIS — Z0189 Encounter for other specified special examinations: Secondary | ICD-10-CM

## 2021-06-23 DIAGNOSIS — Z5181 Encounter for therapeutic drug level monitoring: Secondary | ICD-10-CM | POA: Diagnosis not present

## 2021-06-23 DIAGNOSIS — Z79899 Other long term (current) drug therapy: Secondary | ICD-10-CM | POA: Diagnosis not present

## 2021-06-23 LAB — ECHOCARDIOGRAM LIMITED
Area-P 1/2: 3.83 cm2
S' Lateral: 3.4 cm

## 2021-06-23 NOTE — Telephone Encounter (Signed)
FMLA/Disability forms completed.  Faxed and received "Successful [place] Notices".  Original copies to [facility] file folder for patient pick up behind registrar no.[id number].

## 2021-06-24 ENCOUNTER — Other Ambulatory Visit: Payer: Self-pay

## 2021-06-24 ENCOUNTER — Encounter (HOSPITAL_BASED_OUTPATIENT_CLINIC_OR_DEPARTMENT_OTHER): Payer: Self-pay | Admitting: General Surgery

## 2021-06-24 MED ORDER — LOSARTAN POTASSIUM 25 MG PO TABS: 25.0000 mg | ORAL_TABLET | Freq: Every day | ORAL | 3 refills | Status: DC

## 2021-06-24 MED ORDER — CARVEDILOL 25 MG PO TABS: 25.0000 mg | ORAL_TABLET | Freq: Two times a day (BID) | ORAL | 3 refills | Status: DC

## 2021-06-27 NOTE — H&P (Signed)
Subjective:     Patient ID: Evelyn Marshall is a 36 y.o. female.   HPI   Last seen 12/2020. Returns to discuss left breast reduction at time of right mastectomy.   Patient presented while pregnant and in NIH study and full body MRI at [facility] showed right axillary and subpectoral adenopathy and left supraclavicular lymphadenopathy. Diagnostic MMG/US showed a 1.1 cm right mass 12 o clock, 12 cmfn, a 0.9 cm mass 12 o clock 11cmfn, and a 0.7 cm hypoechoic area. 10 cm of pleomorphic calcifications upper right breast noted on MMG. 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 Clinical stage IV (cT3, cN2, pM1)   Underwent neoadjuvant chemotherapy with AC and following delivery with Taxol Herceptin and Perjeta. Underwent induction with delivery 2.22.23.    PET scan completed following delivery showed hypermetabolic right axillary, subpectoral, bilateral supraclavicular and mediastinal LN.   Completed neoadjuvant chemotherapy 5.18.23. Final MRI demonstrated no remaining masses or NME, resolved skin thickening. Improvement in right-sided adenopathy with resolution abnormal level 3 nodes, level 1 and 2 nodes remained prominent compared to the left but are improved.   Plan right MRM. She may be offered radiation therapy following surgery.   Genetics negative.   Prior to pregnancy, 42 DD. Prepregnancy weight 260 lb.   Patient is a high school special ed teacher, has been on leave since start treatmen. Lives with spouse, kids 4 kids.   Review of Systems Remainder 12 point review negative    Objective:   Physical Exam Cardiovascular:     Rate and Rhythm: Normal rate and regular rhythm.     Heart sounds: Normal heart sounds.  Pulmonary:     Effort: Pulmonary effort is normal.     Breath sounds: Normal breath sounds.  Skin:    Comments: Fitzpatrick 6     Left chest port Breasts: grade 3 ptosis bilateral Right breast mass palpable last visit resolved SN to nipple R 32.5 L 35 cm BW R  26 L 27 cm Nipple to IMF R 12 L 12 cm      Assessment:     Right breast cancer UOQ ER+ Neoadjuvant chemotherapy      Plan:     Pictures today. We have previously discussed post mastectomy reconstruction options and patient may pursue delayed reconstruction. She is interested in left breast reduction. Discussed this may benefit in smaller volume prosthesis over right but if she pursues right reconstruction in future, may require additional procedures to left.   Reviewed reduction with anchor type scars, drains, post operative visits and limitations, recovery. Diminished sensation nipple and breast skin, risk of nipple loss, wound healing problems, asymmetry, incidental carcinoma, changes with wt gain/loss, aging, unacceptable cosmetic appearance reviewed.   Additional risks including but not limited to bleeding hematoma seroma damage to adjacent structures need for additional procedures blood clots in legs or lungs reviewed.   Drain teaching completed. Rx for [place] to [place] given by Dr. Donne Hazel. Rx for oxycodone and robaxin given.

## 2021-06-28 ENCOUNTER — Encounter: Payer: Self-pay | Admitting: Physical Therapy

## 2021-06-29 ENCOUNTER — Other Ambulatory Visit: Payer: Self-pay | Admitting: General Surgery

## 2021-06-29 ENCOUNTER — Encounter: Payer: Self-pay | Admitting: Physical Therapy

## 2021-06-30 ENCOUNTER — Inpatient Hospital Stay: Payer: BC Managed Care – PPO

## 2021-06-30 ENCOUNTER — Other Ambulatory Visit: Payer: Self-pay

## 2021-06-30 ENCOUNTER — Encounter: Payer: Self-pay | Admitting: Hematology and Oncology

## 2021-06-30 ENCOUNTER — Inpatient Hospital Stay: Payer: BC Managed Care – PPO | Attending: Hematology and Oncology

## 2021-06-30 VITALS — BP 137/84 | HR 69 | Temp 98.1°F | Resp 16 | Wt 277.0 lb

## 2021-06-30 DIAGNOSIS — Z170 Estrogen receptor positive status [ER+]: Secondary | ICD-10-CM | POA: Insufficient documentation

## 2021-06-30 DIAGNOSIS — Z5112 Encounter for antineoplastic immunotherapy: Secondary | ICD-10-CM | POA: Diagnosis present

## 2021-06-30 DIAGNOSIS — Z95828 Presence of other vascular implants and grafts: Secondary | ICD-10-CM

## 2021-06-30 DIAGNOSIS — C50411 Malignant neoplasm of upper-outer quadrant of right female breast: Secondary | ICD-10-CM | POA: Diagnosis present

## 2021-06-30 LAB — CBC WITH DIFFERENTIAL (CANCER CENTER ONLY)
Abs Immature Granulocytes: 0.02 K/uL (ref 0.00–0.07)
Basophils Absolute: 0.1 K/uL (ref 0.0–0.1)
Basophils Relative: 1 %
Eosinophils Absolute: 0.1 K/uL (ref 0.0–0.5)
Eosinophils Relative: 2 %
HCT: 34.1 % — ABNORMAL LOW (ref 36.0–46.0)
Hemoglobin: 11.3 g/dL — ABNORMAL LOW (ref 12.0–15.0)
Immature Granulocytes: 1 %
Lymphocytes Relative: 20 %
Lymphs Abs: 0.8 K/uL (ref 0.7–4.0)
MCH: 27.1 pg (ref 26.0–34.0)
MCHC: 33.1 g/dL (ref 30.0–36.0)
MCV: 81.8 fL (ref 80.0–100.0)
Monocytes Absolute: 0.4 K/uL (ref 0.1–1.0)
Monocytes Relative: 11 %
Neutro Abs: 2.6 K/uL (ref 1.7–7.7)
Neutrophils Relative %: 65 %
Platelet Count: 320 K/uL (ref 150–400)
RBC: 4.17 MIL/uL (ref 3.87–5.11)
RDW: 15.4 % (ref 11.5–15.5)
WBC Count: 4 K/uL (ref 4.0–10.5)
nRBC: 0 % (ref 0.0–0.2)

## 2021-06-30 LAB — CMP (CANCER CENTER ONLY)
ALT: 45 U/L — ABNORMAL HIGH (ref 0–44)
AST: 30 U/L (ref 15–41)
Albumin: 4.2 g/dL (ref 3.5–5.0)
Alkaline Phosphatase: 107 U/L (ref 38–126)
Anion gap: 8 (ref 5–15)
BUN: 12 mg/dL (ref 6–20)
CO2: 27 mmol/L (ref 22–32)
Calcium: 10 mg/dL (ref 8.9–10.3)
Chloride: 104 mmol/L (ref 98–111)
Creatinine: 0.88 mg/dL (ref 0.44–1.00)
GFR, Estimated: >60 mL/min (ref >60)
Glucose, Bld: 101 mg/dL — ABNORMAL HIGH (ref 70–99)
Potassium: 3.9 mmol/L (ref 3.5–5.1)
Sodium: 139 mmol/L (ref 135–145)
Total Bilirubin: 0.4 mg/dL (ref 0.3–1.2)
Total Protein: 7.6 g/dL (ref 6.5–8.1)

## 2021-06-30 MED ORDER — SODIUM CHLORIDE 0.9% FLUSH
10.0000 mL | Freq: Once | INTRAVENOUS | Status: AC
  Administered 2021-06-30: 10 mL

## 2021-06-30 MED ORDER — ACETAMINOPHEN 325 MG PO TABS
650.0000 mg | ORAL_TABLET | Freq: Once | ORAL | Status: AC
  Administered 2021-06-30: 650 mg via ORAL
  Filled 2021-06-30: qty 2

## 2021-06-30 MED ORDER — TRASTUZUMAB-DKST CHEMO 150 MG IV SOLR
6.0000 mg/kg | Freq: Once | INTRAVENOUS | Status: AC
  Administered 2021-06-30: 798 mg via INTRAVENOUS
  Filled 2021-06-30: qty 38

## 2021-06-30 MED ORDER — SODIUM CHLORIDE 0.9 % IV SOLN: Freq: Once | INTRAVENOUS | Status: AC

## 2021-06-30 MED ORDER — HEPARIN SOD (PORK) LOCK FLUSH 100 UNIT/ML IV SOLN
500.0000 [IU] | Freq: Once | INTRAVENOUS | Status: AC | PRN
  Administered 2021-06-30: 500 [IU]

## 2021-06-30 MED ORDER — SODIUM CHLORIDE 0.9% FLUSH
10.0000 mL | INTRAVENOUS | Status: DC | PRN
  Administered 2021-06-30: 10 mL

## 2021-06-30 MED ORDER — SODIUM CHLORIDE 0.9 % IV SOLN
420.0000 mg | Freq: Once | INTRAVENOUS | Status: AC
  Administered 2021-06-30: 420 mg via INTRAVENOUS
  Filled 2021-06-30: qty 14

## 2021-06-30 MED ORDER — DIPHENHYDRAMINE HCL 50 MG/ML IJ SOLN
25.0000 mg | Freq: Once | INTRAMUSCULAR | Status: AC
  Administered 2021-06-30: 25 mg via INTRAVENOUS
  Filled 2021-06-30: qty 1

## 2021-06-30 NOTE — Patient Instructions (Signed)
[facility]  Discharge Instructions: Thank you for choosing [facility] to provide your oncology and hematology care.   If you have a lab appointment with the Cancer Center, please go directly to the Cancer Center and check in at the registration area.   Wear comfortable clothing and clothing appropriate for easy access to any Portacath or PICC line.   We strive to give you quality time with your provider. You may need to reschedule your appointment if you arrive late (15 or more minutes).  Arriving late affects you and other patients whose appointments are after yours.  Also, if you miss three or more appointments without notifying the office, you may be dismissed from the clinic at the provider's discretion.      For prescription refill requests, have your pharmacy contact our office and allow 72 hours for refills to be completed.    Today you received the following chemotherapy and/or immunotherapy agents: Ogivri/Perjeta.      To help prevent nausea and vomiting after your treatment, we encourage you to take your nausea medication as directed.  BELOW ARE SYMPTOMS THAT SHOULD BE REPORTED IMMEDIATELY: . *FEVER GREATER THAN 100.4 F (38 C) OR HIGHER . *CHILLS OR SWEATING . *NAUSEA AND VOMITING THAT IS NOT CONTROLLED WITH YOUR NAUSEA MEDICATION . *UNUSUAL SHORTNESS OF BREATH . *UNUSUAL BRUISING OR BLEEDING . *URINARY PROBLEMS (pain or burning when urinating, or frequent urination) . *BOWEL PROBLEMS (unusual diarrhea, constipation, pain near the anus) . TENDERNESS IN MOUTH AND THROAT WITH OR WITHOUT PRESENCE OF ULCERS (sore throat, sores in mouth, or a toothache) . UNUSUAL RASH, SWELLING OR PAIN  . UNUSUAL VAGINAL DISCHARGE OR ITCHING   Items with * indicate a potential emergency and should be followed up as soon as possible or go to the Emergency Department if any problems should occur.  Please show the CHEMOTHERAPY ALERT CARD or IMMUNOTHERAPY  ALERT CARD at check-in to the Emergency Department and triage nurse.  Should you have questions after your visit or need to cancel or reschedule your appointment, please contact [facility]  Dept: [phone]  and follow the prompts.  Office hours are 8:00 a.m. to 4:30 p.m. Monday - Friday. Please note that voicemails left after 4:00 p.m. may not be returned until the following business day.  We are closed weekends and major holidays. You have access to a nurse at all times for urgent questions. Please call the main number to the clinic Dept: [phone] and follow the prompts.   For any non-urgent questions, you may also contact your provider using MyChart. We now offer e-Visits for anyone 18 and older to request care online for non-urgent symptoms. For details visit [email or web address].   Also download the MyChart app! Go to the app store, search "MyChart", open the app, select [facility], and log in with your MyChart username and password.  Due to Covid, a mask is required upon entering the hospital/clinic. If you do not have a mask, one will be given to you upon arrival. For doctor visits, patients may have 1 support person aged 18 or older with them. For treatment visits, patients cannot have anyone with them due to current Covid guidelines and our immunocompromised population.   

## 2021-06-30 NOTE — Progress Notes (Signed)
Pt declined to stay for 30 minute post Perjeta infusion obs.  Pt tolerated tx well without incident. VSS at discharge.  Pt ambulatory to lobby in stable condition.

## 2021-07-04 ENCOUNTER — Ambulatory Visit (INDEPENDENT_AMBULATORY_CARE_PROVIDER_SITE_OTHER): Payer: BC Managed Care – PPO | Admitting: Internal Medicine

## 2021-07-04 ENCOUNTER — Encounter: Payer: Self-pay | Admitting: Internal Medicine

## 2021-07-04 VITALS — BP 116/82 | HR 80 | Ht 64.0 in | Wt 277.2 lb

## 2021-07-04 DIAGNOSIS — C50411 Malignant neoplasm of upper-outer quadrant of right female breast: Secondary | ICD-10-CM | POA: Diagnosis not present

## 2021-07-04 DIAGNOSIS — Z79899 Other long term (current) drug therapy: Secondary | ICD-10-CM | POA: Diagnosis not present

## 2021-07-04 DIAGNOSIS — Z170 Estrogen receptor positive status [ER+]: Secondary | ICD-10-CM | POA: Diagnosis not present

## 2021-07-04 DIAGNOSIS — Z5181 Encounter for therapeutic drug level monitoring: Secondary | ICD-10-CM | POA: Diagnosis not present

## 2021-07-04 NOTE — Patient Instructions (Signed)
Medication Instructions:  No Changes In Medications at this time.  *If you need a refill on your cardiac medications before your next appointment, please call your pharmacy*  Lab Work: Please return for Blood Work in [place]. No appointment needed, lab here at the office is open Monday-Friday from 8AM to 4PM and closed daily for lunch from 12:45-1:45.   If you have labs (blood work) drawn today and your tests are completely normal, you will receive your results only by: [place] (if you have MyChart) OR A paper copy in the mail If you have any lab test that is abnormal or we need to change your treatment, we will call you to review the results.  Testing/Procedures: Your physician has requested that you have an echocardiogram IN 3 MONTHS. Echocardiography is a painless test that uses sound waves to create images of your heart. It provides your doctor with information about the size and shape of your heart and how well your heart's chambers and valves are working. You may receive an ultrasound enhancing agent through an IV if needed to better visualize your heart during the echo.This procedure takes approximately one hour. There are no restrictions for this procedure. This will take place at the [address].   Follow-Up: At [facility], you and your health needs are our priority.  As part of our continuing mission to provide you with exceptional heart care, we have created designated Provider Care Teams.  These Care Teams include your primary Cardiologist (physician) and Advanced Practice Providers (APPs -  Physician Assistants and Nurse Practitioners) who all work together to provide you with the care you need, when you need it.  Your next appointment:   6 month(s)  The format for your next appointment:   In Person  Provider:   Janina Mayo, MD

## 2021-07-04 NOTE — Progress Notes (Signed)
Cardiology Office Note:    Date:  07/04/2021   ID:  CARLO LORSON, DOB Nov 08, 1985, MRN [id number]  PCP:  [name], MD   [facility] Cardiologist:  Janina Mayo, MD     Referring MD: [name], MD   No chief complaint on file. Cardiotoxic Breast Cancer therapy  History of Present Illness:    Evelyn Marshall is a 36 y.o. female with a hx of Stage IV right breast cancer, HTN, hx preeclampsia referral for cardiotoxicity surveillance while on chemotherapy  She recently delivered a baby boy 03/15/2021 with induction of labor at 76 weeks. She had hypertension during pregnancy and hx of preeclampsia. She was managed with magnesium sulfate and labetalol.  She checks her blood pressures at home. Most recent was 118/70 mmHg. She denies CP, shortness of breath. She denies PND, orthopnea, and no LE edema. She did a lot of walking after the delivery.  She has hx of Stage IV ER+ , HER2+ breast cancer RUQ.  AC Q21 days/Trastuzumab + Pertuzumab+ Weekly Paclitaxel. She was diagnosed November, 16th 2022. She was pregnant. Started with AC. She started anti-her2 recently post delivery day 1 cycle 1 03/25/2021. She is planned for adjuvant radiation on the right and adjuvant antiestrogen therapy with ovarian function suppression. Notably she had proteinuria during her pregnancy  Family Hx: Father had hx of congestive HF and on LVAD HM III. Still smoking so not on transplant list. She notes hx of hypertension. Sibling with diabetes.   Social Hx: She has four boys. She is on medical leave. She's a Pharmacist, hospital, teaching special ed. No smoking hx. No etoh use  Interim Hx: She has a BP cuff at home. She notes her blood pressures are more stable.Typically SBP is < 130. She denies  SOB, PND or orthopnea, no LE edema. She is planned for right mastectomy soon with Dr. Donne Hazel.  Past Medical History:  Diagnosis Date   Abnormal Pap smear 2012   colpo result benign   Cancer ([place])    Chlamydia     Family history of breast cancer 12/15/2020   History of pre-eclampsia    HPV (human papilloma virus) infection    Hyperemesis    Hypokalemia    Pregnancy induced hypertension    Ptyalism    Vaginal Pap smear, abnormal     Past Surgical History:  Procedure Laterality Date   COLPOSCOPY     IR IMAGING GUIDED PORT INSERTION  12/24/2020   WISDOM TOOTH EXTRACTION      Current Medications: Current Meds  Medication Sig   carvedilol (COREG) 25 MG tablet Take 1 tablet (25 mg total) by mouth 2 (two) times daily.   Iron-FA-B Cmp-C-Biot-Probiotic (FUSION PLUS) CAPS Take 1 capsule by mouth daily.   lidocaine-prilocaine (EMLA) cream Apply 1 application. topically as needed (apply once weekly prior to port a cath needle insertion.).   losartan (COZAAR) 25 MG tablet Take 1 tablet (25 mg total) by mouth daily.     Allergies:   Patient has no known allergies.   Social History   Socioeconomic History   Marital status: Married    Spouse name: Belenda Cruise   Number of children: 3   Years of education: 16   Highest education level: Bachelor's degree (e.g., BA, AB, BS)  Occupational History   Occupation: Pharmacist, hospital  Tobacco Use   Smoking status: Never   Smokeless tobacco: Never  Vaping Use   Vaping Use: Never used  Substance and Sexual Activity   Alcohol  use: No   Drug use: No   Sexual activity: Yes  Other Topics Concern   Not on file  Social History Narrative   Not on file   Social Determinants of Health   Financial Resource Strain: Not on file  Food Insecurity: Not on file  Transportation Needs: Not on file  Physical Activity: Not on file  Stress: Not on file  Social Connections: Not on file     Family History: The patient's family history includes Breast cancer in her paternal grandmother; Breast cancer (age of onset: 77) in her cousin; Heart disease in her father; Hypertension in her father; Stroke in her father and paternal grandmother.  ROS:   Please see the history of present  illness.      All other systems reviewed and are negative.  EKGs/Labs/Other Studies Reviewed:    The following studies were reviewed today:   EKG:  EKG is  ordered today.  The ekg ordered today demonstrates   NSR, Qtc 440m   Recent Labs: 03/15/2021: Magnesium 3.6 03/29/2021: BNP 8.2 06/30/2021: ALT 45; BUN 12; Creatinine 0.88; Hemoglobin 11.3; Platelet Count 320; Potassium 3.9; Sodium 139  Recent Lipid Panel No results found for: "CHOL", "TRIG", "HDL", "CHOLHDL", "VLDL", "LDLCALC", "LDLDIRECT"   Risk Assessment/Calculations:           Physical Exam:    VS:    Vitals:   07/04/21 0901  BP: 116/82  Pulse: 80  SpO2: 99%     Wt Readings from Last 3 Encounters:  07/04/21 277 lb 3.2 oz (125.7 kg)  06/30/21 277 lb (125.6 kg)  06/09/21 278 lb 9.6 oz (126.4 kg)     GEN:  Well nourished, well developed in no acute distress HEENT: Normal NECK: No JVD;  LYMPHATICS: No lymphadenopathy CARDIAC: RRR, no murmurs, rubs, gallops RESPIRATORY:  Clear to auscultation without rales, wheezing or rhonchi  ABDOMEN:  non-distended MUSCULOSKELETAL:  No edema; No deformity  SKIN: Warm and dry NEUROLOGIC:  Alert and oriented x 3 PSYCHIATRIC:  Normal affect   ASSESSMENT:    #HTN: She had reduced strain on echo; changed her regimen to cardio-protective agents while on trastuzumab. Blood pressures are well controlled today 116/82 mmHg - stopped. labetalol - stopped nifedipine 30 mg daily - continue losartan 25 mg daily and carvedilol 25 mg BID  #Cardio Onc HER2+ BC; [facility] There is ~3% risk of clinical heart failure and ~ 20 % risk of asymptomatic cardiac dysfunction on AC and herceptin. Further, she may be at increased risk with  family hx of CHF and pre-eclampsia. Will aim for Bps closer to 120/80 mmHg. She is on cardio-protective agents. Chemo AC initiation : 12/24/2020- 02/23/2021 Cumulative Dose: 220 mg/m2 Anti-HER2 Initiation: 03/25/2021 Surveillance:   - baseline echo- normal, mild LVH - BNP normal in March -  follow up echo in 3 months 06/2021 showed reduced GLS -16 from -21.4%; will repeat in September  PLAN:    In order of problems listed above:  Limited TTE with strain + BNP on next visit Follow up in 6 months       Medication Adjustments/Labs and Tests Ordered: Current medicines are reviewed at length with the patient today.  Concerns regarding medicines are outlined above.  Orders Placed This Encounter  Procedures   Brain natriuretic peptide   ECHOCARDIOGRAM LIMITED   No orders of the defined types were placed in this encounter.   Patient Instructions  Medication Instructions:  No Changes In Medications at this  time.  *If you need a refill on your cardiac medications before your next appointment, please call your pharmacy*  Lab Work: Please return for Blood Work in [place]. No appointment needed, lab here at the office is open Monday-Friday from 8AM to 4PM and closed daily for lunch from 12:45-1:45.   If you have labs (blood work) drawn today and your tests are completely normal, you will receive your results only by: [place] (if you have MyChart) OR A paper copy in the mail If you have any lab test that is abnormal or we need to change your treatment, we will call you to review the results.  Testing/Procedures: Your physician has requested that you have an echocardiogram IN 3 MONTHS. Echocardiography is a painless test that uses sound waves to create images of your heart. It provides your doctor with information about the size and shape of your heart and how well your heart's chambers and valves are working. You may receive an ultrasound enhancing agent through an IV if needed to better visualize your heart during the echo.This procedure takes approximately one hour. There are no restrictions for this procedure. This will take place at the [address], [address].   Follow-Up: At [facility], you and  your health needs are our priority.  As part of our continuing mission to provide you with exceptional heart care, we have created designated Provider Care Teams.  These Care Teams include your primary Cardiologist (physician) and Advanced Practice Providers (APPs -  Physician Assistants and Nurse Practitioners) who all work together to provide you with the care you need, when you need it.  Your next appointment:   6 month(s)  The format for your next appointment:   In Person  Provider:   Janina Mayo, MD            Signed, Janina Mayo, MD  07/04/2021 9:23 AM    [place]

## 2021-07-05 ENCOUNTER — Other Ambulatory Visit: Payer: Self-pay

## 2021-07-05 ENCOUNTER — Ambulatory Visit (HOSPITAL_BASED_OUTPATIENT_CLINIC_OR_DEPARTMENT_OTHER): Payer: BC Managed Care – PPO | Admitting: Anesthesiology

## 2021-07-05 ENCOUNTER — Encounter (HOSPITAL_BASED_OUTPATIENT_CLINIC_OR_DEPARTMENT_OTHER)
Admission: RE | Disposition: A | Discharge status: Home / Self Care | Payer: Self-pay | Source: Home / Self Care | Attending: General Surgery

## 2021-07-05 ENCOUNTER — Observation Stay (HOSPITAL_COMMUNITY)
Admission: RE | Admit: 2021-07-05 | Discharge: 2021-07-06 | Disposition: A | Discharge status: Home / Self Care | Payer: BC Managed Care – PPO | Attending: General Surgery | Admitting: General Surgery

## 2021-07-05 ENCOUNTER — Encounter (HOSPITAL_BASED_OUTPATIENT_CLINIC_OR_DEPARTMENT_OTHER): Payer: Self-pay | Admitting: General Surgery

## 2021-07-05 DIAGNOSIS — Z79899 Other long term (current) drug therapy: Secondary | ICD-10-CM | POA: Insufficient documentation

## 2021-07-05 DIAGNOSIS — Z170 Estrogen receptor positive status [ER+]: Secondary | ICD-10-CM | POA: Diagnosis not present

## 2021-07-05 DIAGNOSIS — Z7982 Long term (current) use of aspirin: Secondary | ICD-10-CM | POA: Insufficient documentation

## 2021-07-05 DIAGNOSIS — Z9011 Acquired absence of right breast and nipple: Secondary | ICD-10-CM

## 2021-07-05 DIAGNOSIS — O9A113 Malignant neoplasm complicating pregnancy, third trimester: Secondary | ICD-10-CM

## 2021-07-05 DIAGNOSIS — I10 Essential (primary) hypertension: Secondary | ICD-10-CM | POA: Diagnosis not present

## 2021-07-05 DIAGNOSIS — O1413 Severe pre-eclampsia, third trimester: Secondary | ICD-10-CM

## 2021-07-05 DIAGNOSIS — C50411 Malignant neoplasm of upper-outer quadrant of right female breast: Secondary | ICD-10-CM | POA: Diagnosis present

## 2021-07-05 HISTORY — PX: BREAST REDUCTION SURGERY: SHX8

## 2021-07-05 HISTORY — PX: SIMPLE MASTECTOMY WITH AXILLARY SENTINEL NODE BIOPSY: SHX6098

## 2021-07-05 HISTORY — PX: NODE DISSECTION: SHX5269

## 2021-07-05 LAB — POCT PREGNANCY, URINE: Preg Test, Ur: NEGATIVE

## 2021-07-05 SURGERY — SIMPLE MASTECTOMY
Anesthesia: General | Site: Breast | Laterality: Right

## 2021-07-05 MED ORDER — ROPIVACAINE HCL 5 MG/ML IJ SOLN
INTRAMUSCULAR | Status: DC | PRN
  Administered 2021-07-05: 30 mL via PERINEURAL

## 2021-07-05 MED ORDER — SIMETHICONE 80 MG PO CHEW: 40.0000 mg | CHEWABLE_TABLET | Freq: Four times a day (QID) | ORAL | Status: DC | PRN

## 2021-07-05 MED ORDER — ONDANSETRON HCL 4 MG/2ML IJ SOLN: 4.0000 mg | Freq: Four times a day (QID) | INTRAMUSCULAR | Status: DC | PRN

## 2021-07-05 MED ORDER — ONDANSETRON HCL 4 MG/2ML IJ SOLN
INTRAMUSCULAR | Status: AC
  Filled 2021-07-05: qty 2

## 2021-07-05 MED ORDER — FENTANYL CITRATE (PF) 100 MCG/2ML IJ SOLN
INTRAMUSCULAR | Status: DC | PRN
Start: 2021-07-05 — End: 2021-07-05
  Administered 2021-07-05: 50 ug via INTRAVENOUS
  Administered 2021-07-05: 100 ug via INTRAVENOUS

## 2021-07-05 MED ORDER — CELECOXIB 200 MG PO CAPS
ORAL_CAPSULE | ORAL | Status: AC
  Filled 2021-07-05: qty 2

## 2021-07-05 MED ORDER — METHOCARBAMOL 500 MG PO TABS
500.0000 mg | ORAL_TABLET | Freq: Three times a day (TID) | ORAL | Status: DC | PRN
  Administered 2021-07-05: 500 mg via ORAL
  Filled 2021-07-05: qty 1

## 2021-07-05 MED ORDER — PROPOFOL 10 MG/ML IV BOLUS
INTRAVENOUS | Status: DC | PRN
  Administered 2021-07-05: 200 mg via INTRAVENOUS

## 2021-07-05 MED ORDER — CHLORHEXIDINE GLUCONATE CLOTH 2 % EX PADS: 6.0000 | MEDICATED_PAD | Freq: Once | CUTANEOUS | Status: DC

## 2021-07-05 MED ORDER — ROCURONIUM BROMIDE 10 MG/ML (PF) SYRINGE
PREFILLED_SYRINGE | INTRAVENOUS | Status: AC
  Filled 2021-07-05: qty 10

## 2021-07-05 MED ORDER — SUGAMMADEX SODIUM 500 MG/5ML IV SOLN
INTRAVENOUS | Status: DC | PRN
  Administered 2021-07-05: 200 mg via INTRAVENOUS

## 2021-07-05 MED ORDER — MELATONIN 3 MG PO TABS
3.0000 mg | ORAL_TABLET | Freq: Every evening | ORAL | Status: DC | PRN
  Administered 2021-07-05: 3 mg via ORAL
  Filled 2021-07-05: qty 1

## 2021-07-05 MED ORDER — SODIUM CHLORIDE 0.9 % IV SOLN: INTRAVENOUS | Status: DC

## 2021-07-05 MED ORDER — OXYCODONE HCL 5 MG/5ML PO SOLN: 5.0000 mg | Freq: Once | ORAL | Status: DC | PRN

## 2021-07-05 MED ORDER — OXYCODONE HCL 5 MG PO TABS
5.0000 mg | ORAL_TABLET | ORAL | Status: DC | PRN
  Administered 2021-07-05: 5 mg via ORAL
  Filled 2021-07-05: qty 1

## 2021-07-05 MED ORDER — DEXAMETHASONE SODIUM PHOSPHATE 10 MG/ML IJ SOLN
INTRAMUSCULAR | Status: DC | PRN
  Administered 2021-07-05: 10 mg

## 2021-07-05 MED ORDER — ACETAMINOPHEN 500 MG PO TABS
1000.0000 mg | ORAL_TABLET | Freq: Four times a day (QID) | ORAL | Status: DC
  Administered 2021-07-05 – 2021-07-06 (×3): 1000 mg via ORAL
  Filled 2021-07-05 (×3): qty 2

## 2021-07-05 MED ORDER — ACETAMINOPHEN 500 MG PO TABS
ORAL_TABLET | ORAL | Status: AC
  Filled 2021-07-05: qty 2

## 2021-07-05 MED ORDER — FENTANYL CITRATE (PF) 100 MCG/2ML IJ SOLN
100.0000 ug | Freq: Once | INTRAMUSCULAR | Status: AC
  Administered 2021-07-05: 100 ug via INTRAVENOUS

## 2021-07-05 MED ORDER — PHENYLEPHRINE 80 MCG/ML (10ML) SYRINGE FOR IV PUSH (FOR BLOOD PRESSURE SUPPORT)
PREFILLED_SYRINGE | INTRAVENOUS | Status: DC | PRN
  Administered 2021-07-05 (×2): 160 ug via INTRAVENOUS

## 2021-07-05 MED ORDER — METHOCARBAMOL 1000 MG/10ML IJ SOLN: 500.0000 mg | Freq: Three times a day (TID) | INTRAVENOUS | Status: DC | PRN

## 2021-07-05 MED ORDER — CELECOXIB 200 MG PO CAPS
400.0000 mg | ORAL_CAPSULE | ORAL | Status: AC
  Administered 2021-07-05: 400 mg via ORAL

## 2021-07-05 MED ORDER — LOSARTAN POTASSIUM 25 MG PO TABS
25.0000 mg | ORAL_TABLET | Freq: Every day | ORAL | Status: DC
  Filled 2021-07-05: qty 1

## 2021-07-05 MED ORDER — FENTANYL CITRATE (PF) 100 MCG/2ML IJ SOLN
INTRAMUSCULAR | Status: AC
  Filled 2021-07-05: qty 2

## 2021-07-05 MED ORDER — LACTATED RINGERS IV SOLN: INTRAVENOUS | Status: DC

## 2021-07-05 MED ORDER — PHENYLEPHRINE 80 MCG/ML (10ML) SYRINGE FOR IV PUSH (FOR BLOOD PRESSURE SUPPORT)
PREFILLED_SYRINGE | INTRAVENOUS | Status: AC
  Filled 2021-07-05: qty 10

## 2021-07-05 MED ORDER — AMISULPRIDE (ANTIEMETIC) 5 MG/2ML IV SOLN: 10.0000 mg | Freq: Once | INTRAVENOUS | Status: DC | PRN

## 2021-07-05 MED ORDER — KETOROLAC TROMETHAMINE 15 MG/ML IJ SOLN: 15.0000 mg | Freq: Four times a day (QID) | INTRAMUSCULAR | Status: DC | PRN

## 2021-07-05 MED ORDER — DEXAMETHASONE SODIUM PHOSPHATE 4 MG/ML IJ SOLN
INTRAMUSCULAR | Status: DC | PRN
  Administered 2021-07-05: 10 mg via INTRAVENOUS

## 2021-07-05 MED ORDER — ONDANSETRON HCL 4 MG/2ML IJ SOLN
INTRAMUSCULAR | Status: DC | PRN
  Administered 2021-07-05: 4 mg via INTRAVENOUS

## 2021-07-05 MED ORDER — ONDANSETRON 4 MG PO TBDP: 4.0000 mg | ORAL_TABLET | Freq: Four times a day (QID) | ORAL | Status: DC | PRN

## 2021-07-05 MED ORDER — LIDOCAINE 2% (20 MG/ML) 5 ML SYRINGE
INTRAMUSCULAR | Status: AC
  Filled 2021-07-05: qty 10

## 2021-07-05 MED ORDER — LIDOCAINE HCL (CARDIAC) PF 100 MG/5ML IV SOSY
PREFILLED_SYRINGE | INTRAVENOUS | Status: DC | PRN
  Administered 2021-07-05: 100 mg via INTRAVENOUS

## 2021-07-05 MED ORDER — EPHEDRINE 5 MG/ML INJ
INTRAVENOUS | Status: AC
  Filled 2021-07-05: qty 5

## 2021-07-05 MED ORDER — MIDAZOLAM HCL 2 MG/2ML IJ SOLN
2.0000 mg | Freq: Once | INTRAMUSCULAR | Status: AC
  Administered 2021-07-05: 2 mg via INTRAVENOUS

## 2021-07-05 MED ORDER — BUPIVACAINE HCL (PF) 0.25 % IJ SOLN
INTRAMUSCULAR | Status: DC | PRN
  Administered 2021-07-05: 20 mL

## 2021-07-05 MED ORDER — DEXAMETHASONE SODIUM PHOSPHATE 10 MG/ML IJ SOLN
INTRAMUSCULAR | Status: AC
  Filled 2021-07-05: qty 1

## 2021-07-05 MED ORDER — SUGAMMADEX SODIUM 500 MG/5ML IV SOLN
INTRAVENOUS | Status: AC
  Filled 2021-07-05: qty 5

## 2021-07-05 MED ORDER — ENSURE PRE-SURGERY PO LIQD: 296.0000 mL | Freq: Once | ORAL | Status: DC

## 2021-07-05 MED ORDER — CEFAZOLIN IN SODIUM CHLORIDE 3-0.9 GM/100ML-% IV SOLN
3.0000 g | INTRAVENOUS | Status: AC
  Administered 2021-07-05: 3 g via INTRAVENOUS

## 2021-07-05 MED ORDER — MORPHINE SULFATE (PF) 4 MG/ML IV SOLN: 1.0000 mg | INTRAVENOUS | Status: DC | PRN

## 2021-07-05 MED ORDER — ACETAMINOPHEN 500 MG PO TABS
1000.0000 mg | ORAL_TABLET | ORAL | Status: AC
  Administered 2021-07-05: 1000 mg via ORAL

## 2021-07-05 MED ORDER — MIDAZOLAM HCL 2 MG/2ML IJ SOLN
INTRAMUSCULAR | Status: AC
  Filled 2021-07-05: qty 2

## 2021-07-05 MED ORDER — ROCURONIUM BROMIDE 100 MG/10ML IV SOLN
INTRAVENOUS | Status: DC | PRN
  Administered 2021-07-05: 100 mg via INTRAVENOUS

## 2021-07-05 MED ORDER — CEFAZOLIN IN SODIUM CHLORIDE 3-0.9 GM/100ML-% IV SOLN
INTRAVENOUS | Status: AC
  Filled 2021-07-05: qty 100

## 2021-07-05 MED ORDER — ONDANSETRON HCL 4 MG/2ML IJ SOLN: 4.0000 mg | Freq: Once | INTRAMUSCULAR | Status: DC | PRN

## 2021-07-05 MED ORDER — LIDOCAINE 2% (20 MG/ML) 5 ML SYRINGE
INTRAMUSCULAR | Status: AC
  Filled 2021-07-05: qty 5

## 2021-07-05 MED ORDER — FENTANYL CITRATE (PF) 100 MCG/2ML IJ SOLN
25.0000 ug | INTRAMUSCULAR | Status: DC | PRN
  Administered 2021-07-05 (×2): 50 ug via INTRAVENOUS

## 2021-07-05 MED ORDER — CARVEDILOL 25 MG PO TABS
25.0000 mg | ORAL_TABLET | Freq: Two times a day (BID) | ORAL | Status: DC
  Administered 2021-07-05: 25 mg via ORAL
  Filled 2021-07-05: qty 1

## 2021-07-05 MED ORDER — OXYCODONE HCL 5 MG PO TABS: 5.0000 mg | ORAL_TABLET | Freq: Once | ORAL | Status: DC | PRN

## 2021-07-05 SURGICAL SUPPLY — 92 items
ADH SKN CLS APL DERMABOND .7 (GAUZE/BANDAGES/DRESSINGS) ×6
APL PRP STRL LF DISP 70% ISPRP (MISCELLANEOUS) ×4
APL SKNCLS STERI-STRIP NONHPOA (GAUZE/BANDAGES/DRESSINGS)
APPLIER CLIP 11 MED OPEN (CLIP) ×12
APPLIER CLIP 9.375 MED OPEN (MISCELLANEOUS)
APR CLP MED 11 20 MLT OPN (CLIP) ×6
APR CLP MED 9.3 20 MLT OPN (MISCELLANEOUS)
BENZOIN TINCTURE PRP APPL 2/3 (GAUZE/BANDAGES/DRESSINGS) IMPLANT
BINDER BREAST 3XL (GAUZE/BANDAGES/DRESSINGS) IMPLANT
BINDER BREAST LRG (GAUZE/BANDAGES/DRESSINGS) IMPLANT
BINDER BREAST MEDIUM (GAUZE/BANDAGES/DRESSINGS) IMPLANT
BINDER BREAST XLRG (GAUZE/BANDAGES/DRESSINGS) IMPLANT
BINDER BREAST XXLRG (GAUZE/BANDAGES/DRESSINGS) IMPLANT
BIOPATCH RED 1 DISK 7.0 (GAUZE/BANDAGES/DRESSINGS) ×2 IMPLANT
BLADE CLIPPER SURG (BLADE) IMPLANT
BLADE SURG 10 STRL SS (BLADE) ×16 IMPLANT
BLADE SURG 15 STRL LF DISP TIS (BLADE) ×3 IMPLANT
BLADE SURG 15 STRL SS (BLADE) ×4
BNDG GAUZE DERMACEA FLUFF (GAUZE/BANDAGES/DRESSINGS) ×2
BNDG GAUZE DERMACEA FLUFF 4 (GAUZE/BANDAGES/DRESSINGS) ×6 IMPLANT
BNDG GZE DERMACEA 4 6PLY (GAUZE/BANDAGES/DRESSINGS) ×4
CANISTER SUCT 1200ML W/VALVE (MISCELLANEOUS) ×4 IMPLANT
CHLORAPREP W/TINT 26 (MISCELLANEOUS) ×8 IMPLANT
CLIP APPLIE 11 MED OPEN (CLIP) IMPLANT
CLIP APPLIE 9.375 MED OPEN (MISCELLANEOUS) IMPLANT
CLIP TI WIDE RED SMALL 6 (CLIP) ×3 IMPLANT
COVER BACK TABLE 60X90IN (DRAPES) ×4 IMPLANT
COVER MAYO STAND STRL (DRAPES) ×4 IMPLANT
DERMABOND ADVANCED (GAUZE/BANDAGES/DRESSINGS) ×3
DERMABOND ADVANCED .7 DNX12 (GAUZE/BANDAGES/DRESSINGS) ×6 IMPLANT
DRAIN CHANNEL 15F RND FF W/TCR (WOUND CARE) ×1 IMPLANT
DRAIN CHANNEL 19F RND (DRAIN) ×5 IMPLANT
DRAPE LAPAROSCOPIC ABDOMINAL (DRAPES) IMPLANT
DRAPE TOP ARMCOVERS (MISCELLANEOUS) ×4 IMPLANT
DRAPE U-SHAPE 76X120 STRL (DRAPES) ×4 IMPLANT
DRAPE UTILITY XL STRL (DRAPES) ×4 IMPLANT
DRSG PAD ABDOMINAL 8X10 ST (GAUZE/BANDAGES/DRESSINGS) ×8 IMPLANT
DRSG TEGADERM 4X4.75 (GAUZE/BANDAGES/DRESSINGS) ×5 IMPLANT
ELECT BLADE 4.0 EZ CLEAN MEGAD (MISCELLANEOUS)
ELECT COATED BLADE 2.86 ST (ELECTRODE) ×4 IMPLANT
ELECT REM PT RETURN 9FT ADLT (ELECTROSURGICAL) ×4
ELECTRODE BLDE 4.0 EZ CLN MEGD (MISCELLANEOUS) IMPLANT
ELECTRODE REM PT RTRN 9FT ADLT (ELECTROSURGICAL) ×3 IMPLANT
EVACUATOR SILICONE 100CC (DRAIN) ×6 IMPLANT
GAUZE SPONGE 4X4 12PLY STRL (GAUZE/BANDAGES/DRESSINGS) ×4 IMPLANT
GAUZE SPONGE 4X4 12PLY STRL LF (GAUZE/BANDAGES/DRESSINGS) IMPLANT
GLOVE BIO SURGEON STRL SZ 6 (GLOVE) ×8 IMPLANT
GLOVE BIO SURGEON STRL SZ7 (GLOVE) ×6 IMPLANT
GLOVE BIOGEL PI IND STRL 7.5 (GLOVE) ×3 IMPLANT
GLOVE BIOGEL PI INDICATOR 7.5 (GLOVE) ×1
GOWN STRL REUS W/ TWL LRG LVL3 (GOWN DISPOSABLE) ×9 IMPLANT
GOWN STRL REUS W/TWL LRG LVL3 (GOWN DISPOSABLE) ×12
HEMOSTAT ARISTA ABSORB 3G PWDR (HEMOSTASIS) ×2 IMPLANT
HEMOSTAT SNOW SURGICEL 2X4 (HEMOSTASIS) ×1 IMPLANT
MARKER SKIN DUAL TIP RULER LAB (MISCELLANEOUS) IMPLANT
NDL HYPO 25X1 1.5 SAFETY (NEEDLE) ×3 IMPLANT
NEEDLE HYPO 25X1 1.5 SAFETY (NEEDLE) ×4 IMPLANT
NS IRRIG 1000ML POUR BTL (IV SOLUTION) ×4 IMPLANT
PACK BASIN DAY SURGERY FS (CUSTOM PROCEDURE TRAY) ×4 IMPLANT
PENCIL SMOKE EVACUATOR (MISCELLANEOUS) ×4 IMPLANT
PIN SAFETY STERILE (MISCELLANEOUS) ×4 IMPLANT
RETRACTOR ONETRAX LX 90X20 (MISCELLANEOUS) IMPLANT
SHEET MEDIUM DRAPE 40X70 STRL (DRAPES) ×8 IMPLANT
SLEEVE SCD COMPRESS KNEE MED (STOCKING) ×4 IMPLANT
SPIKE FLUID TRANSFER (MISCELLANEOUS) IMPLANT
SPONGE T-LAP 18X18 ~~LOC~~+RFID (SPONGE) ×16 IMPLANT
SPONGE T-LAP 4X18 ~~LOC~~+RFID (SPONGE) ×3 IMPLANT
STAPLER VISISTAT 35W (STAPLE) ×4 IMPLANT
STRIP CLOSURE SKIN 1/2X4 (GAUZE/BANDAGES/DRESSINGS) ×1 IMPLANT
SUT ETHILON 2 0 FS 18 (SUTURE) ×5 IMPLANT
SUT MNCRL AB 4-0 PS2 18 (SUTURE) ×12 IMPLANT
SUT MON AB 5-0 PS2 18 (SUTURE) IMPLANT
SUT SILK 2 0 SH (SUTURE) ×4 IMPLANT
SUT VIC AB 2-0 SH 27 (SUTURE) ×4
SUT VIC AB 2-0 SH 27XBRD (SUTURE) ×3 IMPLANT
SUT VIC AB 3-0 PS1 18 (SUTURE) ×36
SUT VIC AB 3-0 PS1 18XBRD (SUTURE) ×18 IMPLANT
SUT VIC AB 3-0 SH 27 (SUTURE) ×4
SUT VIC AB 3-0 SH 27X BRD (SUTURE) ×3 IMPLANT
SUT VICRYL 3-0 CR8 SH (SUTURE) ×5 IMPLANT
SUT VICRYL 4-0 PS2 18IN ABS (SUTURE) ×9 IMPLANT
SUT VICRYL AB 2 0 TIE (SUTURE) IMPLANT
SUT VICRYL AB 2 0 TIES (SUTURE)
SUT VICRYL AB 3 0 TIES (SUTURE) IMPLANT
SUT VLOC 180 P-14 24 (SUTURE) ×8 IMPLANT
SYR BULB IRRIG 60ML STRL (SYRINGE) ×4 IMPLANT
SYR CONTROL 10ML LL (SYRINGE) ×4 IMPLANT
TAPE MEASURE VINYL STERILE (MISCELLANEOUS) IMPLANT
TOWEL GREEN STERILE FF (TOWEL DISPOSABLE) ×8 IMPLANT
TUBE CONNECTING 20X1/4 (TUBING) ×4 IMPLANT
UNDERPAD 30X36 HEAVY ABSORB (UNDERPADS AND DIAPERS) ×8 IMPLANT
YANKAUER SUCT BULB TIP NO VENT (SUCTIONS) ×4 IMPLANT

## 2021-07-05 NOTE — Op Note (Signed)
Preoperative diagnosis right breast cancer status post primary chemotherapy Postoperative diagnosis: Same as above Procedure: Right modified radical mastectomy Surgeon: Dr. Serita Grammes Estimated blood loss: 50 cc Specimens: Right breast tissue and axillary contents marked short superior, long marks axillary contents Complications: None Drains: 219 French Blake drains placed in the mastectomy site as well as the axillary lymph node dissection site Complications: None Anesthesia: General with a pectoral block Sponge needle count was correct at completion Disposition Case turned over to plastic surgery to complete the reduction on the other side  Indications: This is a 36 year old female I initially saw when she was [redacted] weeks pregnant.  She was on a study that she was being evaluated for high risk pregnancy and underwent a full body MRI.  This showed several right axillary and subpectoral nodes at that time.  She also had a prominent 1.4 cm left supraclavicular node.  She was evaluated here underwent mammography that showed several small masses between 6 to 12 mm.  There is also 10 x 10 x 10 cm of calcifications with at least 5 abnormal lymph nodes.  Biopsy of the lymph node was positive.  The tumor was a grade 3 invasive ductal carcinoma that is 30% ER positive, PR negative, HER2 positive and her Ki-67 was 40%.  Her genetic testing was negative.  She was begun on ACE and was thought to have worsening disease at that point.  We elected to induce her at that point.  She then started anti-HER2 therapy.  This led to the latest MRI showing really what is complete resolution.  After discussed in a multidisciplinary fashion it was elected to proceed with the intent to cure.  I think that she needed a right modified radical mastectomy for this.  Procedure: After informed consent was obtained she first underwent a pectoral block.  She had SCDs in place.  Antibiotics were given.  She was placed under general  anesthesia without complication.  She was prepped and draped in the standard sterile surgical fashion.  A surgical timeout was then performed.  I then marked out a mastectomy incision that would end up being in an anchor fashion.  I remove the nipple and the areola and then created flaps to the parasternal area, clavicle, latissimus, and I am fold inferiorly.  The breast tissue and the fascia were then removed from the muscle.  I then entered into the axilla.  Her axilla had numerous nodes that were mildly enlarged.  This was also very difficult as it was very scarred in.  I was able to identify where the long thoracic nerve as well as the thoracodorsal bundle was.  I also identified the axillary vein.  I swept this tissue caudad.  This required a fairly tedious dissection off of the vein using multiple clips as there were nodes that were very adherent to the vein.  I then was able to clear all of the gross nodal disease which stretched up into level 1.  There was no other surgical option for any remaining disease that might be present.  I then obtained hemostasis.  I placed a spinal piece of Surgicel snow at the apex of this dissection.  I then placed some Arista.  I obtained hemostasis.  I placed 219 [place] Blake drains and secured these with 2-0 nylon suture.  I then closed this with 3-0 Vicryl and 4-0 Monocryl.  Steri-Strips and glue were placed.  She tolerated this well and the case was turned over to plastic surgery  to complete the reduction on the left side.

## 2021-07-05 NOTE — Anesthesia Postprocedure Evaluation (Signed)
Anesthesia Post Note  Patient: Evelyn Marshall  Procedure(s) Performed: RIGHT MASTECTOMY (Right: Breast) RIGHT AXILLARY NODE DISSECTION (Right: Axilla) LEFT MAMMARY REDUCTION  (BREAST) (Left: Breast)     Patient location during evaluation: PACU Anesthesia Type: General Level of consciousness: awake and alert Pain management: pain level controlled Vital Signs Assessment: post-procedure vital signs reviewed and stable Respiratory status: spontaneous breathing, nonlabored ventilation and respiratory function stable Cardiovascular status: blood pressure returned to baseline and stable Postop Assessment: no apparent nausea or vomiting Anesthetic complications: no   No notable events documented.  Last Vitals:  Vitals:   07/05/21 1615 07/05/21 1645  BP: 132/85 (!) 138/91  Pulse: 80 75  Resp: 16 16  Temp:  36.9 C  SpO2: 94% 95%    Last Pain:  Vitals:   07/05/21 1645  TempSrc:   PainSc: 2                  Lidia Collum

## 2021-07-05 NOTE — Progress Notes (Signed)
Assisted Dr. Witman with right, pectoralis, ultrasound guided block. Side rails up, monitors on throughout procedure. See vital signs in flow sheet. Tolerated Procedure well. 

## 2021-07-05 NOTE — Anesthesia Preprocedure Evaluation (Signed)
Anesthesia Evaluation  Patient identified by MRN, date of birth, ID band Patient awake    Reviewed: Allergy & Precautions, NPO status , Patient's Chart, lab work & pertinent test results  History of Anesthesia Complications Negative for: history of anesthetic complications  Airway Mallampati: II  TM Distance: >3 FB Neck ROM: Full    Dental  (+) Teeth Intact, Dental Advisory Given   Pulmonary neg pulmonary ROS,    Pulmonary exam normal        Cardiovascular hypertension, Normal cardiovascular exam     Neuro/Psych negative neurological ROS     GI/Hepatic negative GI ROS, Neg liver ROS,   Endo/Other  Morbid obesity  Renal/GU negative Renal ROS  negative genitourinary   Musculoskeletal negative musculoskeletal ROS (+)   Abdominal   Peds  Hematology negative hematology ROS (+)   Anesthesia Other Findings Breast cancer  Reproductive/Obstetrics                            Anesthesia Physical Anesthesia Plan  ASA: 3  Anesthesia Plan: General   Post-op Pain Management: Tylenol PO (pre-op)* and Celebrex PO (pre-op)*   Induction: Intravenous  PONV Risk Score and Plan: 3 and Ondansetron, Dexamethasone, Treatment may vary due to age or medical condition and Midazolam  Airway Management Planned: Oral ETT  Additional Equipment: None  Intra-op Plan:   Post-operative Plan: Extubation in OR  Informed Consent: I have reviewed the patients History and Physical, chart, labs and discussed the procedure including the risks, benefits and alternatives for the proposed anesthesia with the patient or authorized representative who has indicated his/her understanding and acceptance.     Dental advisory given  Plan Discussed with:   Anesthesia Plan Comments:         Anesthesia Quick Evaluation

## 2021-07-05 NOTE — Anesthesia Procedure Notes (Signed)
Procedure Name: Intubation Date/Time: 07/05/2021 11:35 AM  Performed by: Ezequiel Kayser, CRNAPre-anesthesia Checklist: Patient identified, Emergency Drugs available, Suction available and Patient being monitored Patient Re-evaluated:Patient Re-evaluated prior to induction Oxygen Delivery Method: Circle System Utilized Preoxygenation: Pre-oxygenation with 100% oxygen Induction Type: IV induction Ventilation: Mask ventilation without difficulty Laryngoscope Size: Mac and 3 Grade View: Grade I Tube type: Oral Tube size: 7.0 mm Number of attempts: 1 Airway Equipment and Method: Stylet and Oral airway Placement Confirmation: ETT inserted through vocal cords under direct vision, positive ETCO2 and breath sounds checked- equal and bilateral Secured at: 22 cm Tube secured with: Tape Dental Injury: Teeth and Oropharynx as per pre-operative assessment

## 2021-07-05 NOTE — Discharge Instructions (Signed)
[facility] °[phone] ° °MASTECTOMY: POST OP INSTRUCTIONS °Take 400 mg of ibuprofen every 8 hours or 650 mg tylenol every 6 hours for next 72 hours then as needed. Use ice several times daily also. °Always review your discharge instruction sheet given to you by the facility where your surgery was performed. °IF YOU HAVE DISABILITY OR FAMILY LEAVE FORMS, YOU MUST BRING THEM TO THE OFFICE FOR PROCESSING.   °DO NOT GIVE THEM TO YOUR DOCTOR. °A prescription for pain medication may be given to you upon discharge.  Take your pain medication as prescribed, if needed.  If narcotic pain medicine is not needed, then you may take acetaminophen (Tylenol), naprosyn (Alleve) or ibuprofen (Advil) as needed. °Take your usually prescribed medications unless otherwise directed. °If you need a refill on your pain medication, please contact your pharmacy.  They will contact our office to request authorization.  Prescriptions will not be filled after 5pm or on week-ends. °You should follow a light diet the first few days after arrival home, such as soup and crackers, etc.  Resume your normal diet the day after surgery. °Most patients will experience some swelling and bruising on the chest and underarm.  Ice packs will help.  Swelling and bruising can take several days to resolve. Wear the binder day and night until you return to the office.  °It is common to experience some constipation if taking pain medication after surgery.  Increasing fluid intake and taking a stool softener (such as Colace) will usually help or prevent this problem from occurring.  A mild laxative (Milk of Magnesia or Miralax) should be taken according to package instructions if there are no bowel movements after 48 hours. °Unless discharge instructions indicate otherwise, leave your bandage dry and in place until your next appointment in 3-5 days.  You may take a limited sponge bath.  No tube baths or showers until the drains are removed.   You may have steri-strips (small skin tapes) in place directly over the incision.  These strips should be left on the skin for 7-10 days. If you have glue it will come off in next couple week.  Any sutures will be removed at an office visit °DRAINS:  If you have drains in place, it is important to keep a list of the amount of drainage produced each day in your drains.  Before leaving the hospital, you should be instructed on drain care.  Call our office if you have any questions about your drains. I will remove your drains when they put out less than 30 cc or ml for 2 consecutive days. °ACTIVITIES:  You may resume regular (light) daily activities beginning the next day--such as daily self-care, walking, climbing stairs--gradually increasing activities as tolerated.  You may have sexual intercourse when it is comfortable.  Refrain from any heavy lifting or straining until approved by your doctor. °You may drive when you are no longer taking prescription pain medication, you can comfortably wear a seatbelt, and you can safely maneuver your car and apply brakes. °RETURN TO WORK:  __________________________________________________________ °You should see your doctor in the office for a follow-up appointment approximately 3-5 days after your surgery.  Your doctor’s nurse will typically make your follow-up appointment when she calls you with your pathology report.  Expect your pathology report 3-4business days after surgery. °OTHER INSTRUCTIONS: ______________________________________________________________________________________________ ____________________________________________________________________________________________ °WHEN TO CALL YOUR DR Shail Urbas: °Fever over 101.0 °Nausea and/or vomiting °Extreme swelling or bruising °Continued bleeding from incision. °Increased pain, redness,   or drainage from the incision. °The clinic staff is available to answer your questions during regular business hours.  Please don’t  hesitate to call and ask to speak to one of the nurses for clinical concerns.  If you have a medical emergency, go to the nearest emergency room or call 911.  A surgeon from [facility] is always on call at the hospital. °[address], [address], [place], [place]  [address] ? [address] [address], [place], [place]   [address] °[phone] ? [phone] ? FAX [phone] °Web site: [email or web address] ° ° °About my Jackson-Pratt Bulb Drain ° °What is a Jackson-Pratt bulb? °A Jackson-Pratt is a soft, round device used to collect drainage. It is connected to a long, thin drainage catheter, which is held in place by one or two small stiches near your surgical incision site. When the bulb is squeezed, it forms a vacuum, forcing the drainage to empty into the bulb. ° °Emptying the Jackson-Pratt bulb- °To empty the bulb: °1. Release the plug on the top of the bulb. °2. Pour the bulb's contents into a measuring container which your nurse will provide. °3. Record the time emptied and amount of drainage. Empty the drain(s) as often as your     doctor or nurse recommends. ° °Date                  Time                    Amount (Drain 1)                 Amount (Drain 2) ° °__________________________________________________________________________________ ° °__________________________________________________________________________________ ° °__________________________________________________________________________________ ° °__________________________________________________________________________________ ° °__________________________________________________________________________________ ° °__________________________________________________________________________________ ° °__________________________________________________________________________________ ° °__________________________________________________________________________________ ° °Squeezing the Jackson-Pratt Bulb- °To squeeze the  bulb: °1. Make sure the plug at the top of the bulb is open. °2. Squeeze the bulb tightly in your fist. You will hear air squeezing from the bulb. °3. Replace the plug while the bulb is squeezed. °4. Use a safety pin to attach the bulb to your clothing. This will keep the catheter from     pulling at the bulb insertion site. ° °When to call your doctor- °Call your doctor if: °Drain site becomes red, swollen or hot. °You have a fever greater than 101 degrees F. °There is oozing at the drain site. °Drain falls out (apply a guaze bandage over the drain hole and secure it with tape). °Drainage increases daily not related to activity patterns. (You will usually have more drainage when you are active than when you are resting.) °Drainage has a bad odor. °  °

## 2021-07-05 NOTE — Interval H&P Note (Signed)
History and Physical Interval Note:  07/05/2021 11:09 AM  Evelyn Marshall  has presented today for surgery, with the diagnosis of RIGHT BREAST CANCER.  The various methods of treatment have been discussed with the patient and family. After consideration of risks, benefits and other options for treatment, the patient has consented to  Procedure(s) with comments: RIGHT MASTECTOMY (Right) - GEN & PEC BLOCK RIGHT AXILLARY NODE DISSECTION (Right) LEFT MAMMARY REDUCTION  (BREAST) (Left) as a surgical intervention.  The patient's history has been reviewed, patient examined, no change in status, stable for surgery.  I have reviewed the patient's chart and labs.  Questions were answered to the patient's satisfaction.     Rolm Bookbinder

## 2021-07-05 NOTE — Transfer of Care (Signed)
Immediate Anesthesia Transfer of Care Note  Patient: [name] N B[place]  Procedure(s) Performed: RIGHT MASTECTOMY (Right: Breast) RIGHT AXILLARY NODE DISSECTION (Right: Axilla) LEFT MAMMARY REDUCTION  (BREAST) (Left: Breast)  Patient Location: PACU  Anesthesia Type:GA combined with regional for post-op pain  Level of Consciousness: awake  Airway & Oxygen Therapy: Patient Spontanous Breathing and Patient connected to face mask oxygen  Post-op Assessment: Report given to RN and Post -op Vital signs reviewed and stable  Post vital signs: Reviewed and stable  Last Vitals:  Vitals Value Taken Time  BP    Temp    Pulse 76 07/05/21 1506  Resp 20 07/05/21 1506  SpO2 99 % 07/05/21 1506  Vitals shown include unvalidated device data.  Last Pain:  Vitals:   07/05/21 0947  TempSrc: Oral  PainSc: 0-No pain      Patients Stated Pain Goal: 3 (73/71/06 2694)  Complications: No notable events documented.

## 2021-07-05 NOTE — H&P (Signed)
36 y.o. female who is seen today for follow up. She was [redacted] weeks pregnant at that time.  She had an abnormal noninvasive pregnancy testing due to her high risk pregnancy.  This also coincided with the presence of a right breast mass.  She is on a study at the [facility] that she was being evaluated for all of this and she underwent a full body MRI. This showed several right axillary and subpectoral nodes the largest measuring 3.7 cm as well as multiple right breast nodules.  She also had a prominent 1.4 cm left supraclavicular node.  She was then evaluated here with mammogram and ultrasound.  Mammogram shows several small masses measuring between 6 to 12 mm.  There is also 10 x 10 x 10 cm of calcifications.  She has at least 5 abnormal lymph nodes.  Biopsy of one of the lymph nodes is positive.  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  She did palpate the mass.  She does not have any nipple discharge.   .  She does have a family history of maternal cousin at age 14 and genetic testing was negative. She has been on [facility] and was thought to have worsening disease. She had repeat US that showed this shows in area of concern that it was just pregnancy related changes. The prior masses are all either stable or smaller at this point. The node is the same that was biopsied. She was induced several months ago and then started hp. Her latest mri shows really what is complete resolution. She finishes chemo next week and has tolerated this well   Review of Systems: A complete review of systems was obtained from the patient. I have reviewed this information and discussed as appropriate with the patient. See HPI as well for other ROS.  Review of Systems  All other systems reviewed and are negative.   Medical History: Past Medical History:  Diagnosis Date   History of cancer   Hypertension   Patient Active Problem List   Diagnosis   Malignant neoplasm of upper-outer quadrant of right breast in female, estrogen receptor positive (CMS-HCC)   No Known Allergies  Current Outpatient Medications on File Prior to Visit  Medication Sig Dispense Refill   aspirin 81 MG EC tablet Take by mouth   doxylamine-pyridoxine, vit B6, (DICLEGIS) 10-10 mg DR tablet   FUSION PLUS 130 mg iron -1,250 mcg Cap Take 1 capsule by mouth once daily   labetaloL (TRANDATE) 200 MG tablet   NIFEdipine (ADALAT CC) 30 MG ER tablet    Family History  Problem Relation Age of Onset   Skin cancer Father   Heart valve disease Father   Hyperlipidemia (Elevated cholesterol) Father   High blood pressure (Hypertension) Father   Obesity Sister   High blood pressure (Hypertension) Sister   Diabetes Sister    Social History   Tobacco Use  Smoking Status Never  Smokeless Tobacco Never    Social History   Socioeconomic History   Marital status: Married  Tobacco Use   Smoking status: Never   Smokeless tobacco: Never  Vaping Use   Vaping Use: Never used  Substance and Sexual Activity   Alcohol use: Never   Drug use: Never   Objective:   There were no vitals filed for this visit.  There is no height or weight on file to calculate BMI.  Physical Exam Vitals  reviewed.  Constitutional:  Appearance: Normal appearance.  Chest:  Breasts: Right: No inverted nipple, mass or nipple discharge.  Left: No inverted nipple, mass or nipple discharge.  Lymphadenopathy:  Upper Body:  Right upper body: No supraclavicular or axillary adenopathy.  Left upper body: No supraclavicular or axillary adenopathy.  Neurological:  Mental Status: She is alert.     Assessment and Plan:    Malignant neoplasm of upper-outer quadrant of right breast in female, estrogen receptor positive (CMS-HCC)   Right MRM We had a long discussion today about her cancer on initial imaging as well as the MRIs. She has thought a lot about this. I do think she  needs an axillary lymph node dissection. She would also like to proceed with a mastectomy and does not want to try to preserve that breast. I also think this is very reasonable given the extent of her cancer at the outset. We discussed what would eventually be a modified radical mastectomy. We discussed drain placement as well as hospital stay and recovery. I am going to schedule her in about 5 weeks. She is interested long-term and potentially having autologous reconstruction so she does not want to do this at the same time. She will also receive radiotherapy. She would however like to consider a reduction on the left side at the same time and I am going to contact plastic surgery about that.

## 2021-07-05 NOTE — Op Note (Signed)
Operative Note   DATE OF OPERATION: 6.13.23  LOCATION: [facility]-observation  SURGICAL DIVISION: Plastic Surgery  PREOPERATIVE DIAGNOSES:  1. Right breast cancer UOQ ER+ 2. Neoadjuvant chemotherapy  POSTOPERATIVE DIAGNOSES:  same  PROCEDURE:  Left breast reduction  SURGEON: Irene Limbo MD MBA  ASSISTANT: none  ANESTHESIA:  General.   EBL: 180 ml for entire procedure  COMPLICATIONS: None immediate.   INDICATIONS FOR PROCEDURE:  The patient, Evelyn Marshall, is a 36 y.o. female born on Aug 07, 1985, is here for right mastectomy and left breast reduction following completion primary chemotherapy for right breast cancer.   FINDINGS: Left reduction 474 g  DESCRIPTION OF PROCEDURE:  The patient was marked standing in the preoperative area to mark sternal notch, chest midline, anterior axillary line, inframammary fold. The location of new nipple areolar complex was marked at level of on inframammary fold on anterior surface breast by palpation. With aid of Wise pattern marker, location of new nipple areolar complex and vertical limbs (7 cm) were marked by displacement of breasts along meridian. Patient marked over right chest for skin reduction pattern mastectomy.The patient was taken to the operating room. SCDs were placed and IV antibiotics were given. The patient's operative site was prepped and draped in a sterile fashion. A time out was performed and all information was confirmed to be correct. I assisted in right modified radical mastectomy in exposure and retraction.   Following completion mastectomy, over left breast, superior medial pedicle marked and nipple areolar complex incised with 45 mm diameter marker. Pedicle deepithlialized and developed to chest wall. Breast tissue resected over lower pole. Medial and lateral flaps developed. Additional lateral and superior breast tissue excised. Breast cavity irrigated and hemostasis obtained. Local anesthetic infiltrated. 15 Fr JP  placed and secured with 2-0 nylon. Closure completed with 3-0 vicryl to approximate dermis along inframammary fold and vertical limb. NAC inset with 4-0 vicryl in dermis. Skin closure completed with 4-0 monocryl subcuticular throughout. Tissue adhesive applied. Dry dressing and compression bra applied  The patient was allowed to wake from anesthesia, extubated and taken to the recovery room in satisfactory condition.   SPECIMENS: left breast reduction  DRAINS: 15 Fr JP in left breast  Irene Limbo, MD [facility] Plastic & Reconstructive Surgery  Office/ physician access line after hours [phone]

## 2021-07-05 NOTE — Interval H&P Note (Signed)
History and Physical Interval Note:  07/05/2021 9:25 AM  Evelyn Marshall  has presented today for surgery, with the diagnosis of RIGHT BREAST CANCER.  The various methods of treatment have been discussed with the patient and family. After consideration of risks, benefits and other options for treatment, the patient has consented to  Procedure(s) with comments: RIGHT MASTECTOMY (Right) - GEN & PEC BLOCK RIGHT AXILLARY NODE DISSECTION (Right) LEFT MAMMARY REDUCTION  (BREAST) (Left) as a surgical intervention.  The patient's history has been reviewed, patient examined, no change in status, stable for surgery.  I have reviewed the patient's chart and labs.  Questions were answered to the patient's satisfaction.     Arnoldo Hooker Pacer Dorn

## 2021-07-05 NOTE — Anesthesia Procedure Notes (Signed)
Anesthesia Regional Block: Pectoralis block   Pre-Anesthetic Checklist: , timeout performed,  Correct Patient, Correct Site, Correct Laterality,  Correct Procedure, Correct Position, site marked,  Risks and benefits discussed,  Surgical consent,  Pre-op evaluation,  At surgeon's request and post-op pain management  Laterality: Right  Prep: chloraprep       Needles:  Injection technique: Single-shot  Needle Type: Echogenic Stimulator Needle     Needle Length: 10cm  Needle Gauge: 20     Additional Needles:   Procedures:,,,, ultrasound used (permanent image in chart),,    Narrative:  Start time: 07/05/2021 10:35 AM End time: 07/05/2021 10:38 AM Injection made incrementally with aspirations every 5 mL.  Performed by: Personally  Anesthesiologist: Lidia Collum, MD  Additional Notes: Standard monitors applied. Skin prepped. Good needle visualization with ultrasound. Injection made in 5cc increments with no resistance to injection. Patient tolerated the procedure well.

## 2021-07-05 NOTE — Interval H&P Note (Signed)
History and Physical Interval Note:  07/05/2021 10:55 AM  Evelyn Marshall  has presented today for surgery, with the diagnosis of RIGHT BREAST CANCER.  The various methods of treatment have been discussed with the patient and family. After consideration of risks, benefits and other options for treatment, the patient has consented to  Procedure(s) with comments: RIGHT MASTECTOMY (Right) - GEN & PEC BLOCK RIGHT AXILLARY NODE DISSECTION (Right) LEFT MAMMARY REDUCTION  (BREAST) (Left) as a surgical intervention.  The patient's history has been reviewed, patient examined, no change in status, stable for surgery.  I have reviewed the patient's chart and labs.  Questions were answered to the patient's satisfaction.     Rolm Bookbinder

## 2021-07-06 ENCOUNTER — Encounter: Payer: Self-pay | Admitting: Physical Therapy

## 2021-07-06 NOTE — Discharge Summary (Signed)
Physician Discharge Summary  Patient ID: Evelyn Marshall MRN: [id number] DOB/AGE: 01-24-1986 36 y.o.  Admit date: 07/05/2021 Discharge date: 07/06/2021  Admission Diagnoses: Right breast cancer  Discharge Diagnoses:  same  Discharged Condition: stable  Hospital Course: Post operatively patient did well with pain controlled and ambulatory with minimal assist. Instructed on drain care and bathing.   Treatments: surgery: right modified radical mastectomy left breast reduction 6.13.23  Discharge Exam: Blood pressure 118/68, pulse 72, temperature 99.1 F (37.3 C), resp. rate 16, height '5\' 4"'$  (1.626 m), weight 125.8 kg, SpO2 99 %, not currently breastfeeding. Incision/Wound: chest soft bilateral incisions dry intact left NAC viable drains serosanguinous  Disposition: Discharge disposition: 01-Home or Self Care       Discharge Instructions     Call MD for:  redness, tenderness, or signs of infection (pain, swelling, bleeding, redness, odor or green/yellow discharge around incision site)   Complete by: As directed    Call MD for:  temperature >100.5   Complete by: As directed    Discharge instructions   Complete by: As directed    Ok to remove dressings and shower am 6.15.23. Soap and water ok, pat incisions dry. Do not remove Tegaderms or steris. No creams or ointments over incisions. Do not let drains dangle in shower, attach to lanyard or similar.Strip and record drains twice daily and bring log to clinic visit.  Breast binder or soft compression bra all other times.  Ok to raise arms above shoulders for bathing and dressing.  No house yard work or exercise until cleared by MD.   Recommend ibuprofen with meals to aid with pain control. Also ok to use Tylenol for pain. Patient received all Rx preop. Recommend Miralax or Dulcolax for constipation.   Driving Restrictions   Complete by: As directed    No driving if taking prescription pain medication   Lifting restrictions    Complete by: As directed    No lifting > 5-10 lbs until cleared by MD   Resume previous diet   Complete by: As directed       Allergies as of 07/06/2021   No Known Allergies      Medication List     TAKE these medications    carvedilol 25 MG tablet Commonly known as: COREG Take 1 tablet (25 mg total) by mouth 2 (two) times daily.   Fusion Plus Caps Take 1 capsule by mouth daily.   lidocaine-prilocaine cream Commonly known as: EMLA Apply 1 application. topically as needed (apply once weekly prior to port a cath needle insertion.).   losartan 25 MG tablet Commonly known as: COZAAR Take 1 tablet (25 mg total) by mouth daily.        Follow-up Information     Rolm Bookbinder, MD Follow up in 2 week(s).   Specialty: General Surgery Contact information: [address] [place] [address] [phone]         Irene Limbo, MD Follow up in 1 week(s).   Specialty: Plastic Surgery Why: as scheduled Contact information: [place] [address] [place] [address] [phone]                 Signed: Irene Limbo 07/06/2021, 7:35 AM

## 2021-07-07 ENCOUNTER — Encounter (HOSPITAL_BASED_OUTPATIENT_CLINIC_OR_DEPARTMENT_OTHER): Payer: Self-pay | Admitting: General Surgery

## 2021-07-08 LAB — SURGICAL PATHOLOGY

## 2021-07-08 NOTE — Progress Notes (Signed)
 Patient Care Team: Smith, Karla, MD as PCP - General (Family Medicine) Branch, Mary E, MD as PCP - Cardiology (Cardiology) Gudena, Vinay, MD as Consulting Physician (Hematology and Oncology) Cousins, Sheronette, MD as Consulting Physician (Obstetrics and Gynecology) Thimmappa, Brinda, MD as Consulting Physician (Plastic Surgery) Wakefield, Matthew, MD as Consulting Physician (General Surgery)  DIAGNOSIS:  Encounter Diagnosis  Name Primary?   Malignant neoplasm of upper-outer quadrant of right breast in female, estrogen receptor positive (HCC) Yes    SUMMARY OF ONCOLOGIC HISTORY: Oncology History  Malignant neoplasm of upper-outer quadrant of right breast in female, estrogen receptor positive (HCC)  12/08/2020 Initial Diagnosis   21-week pregnancy: MRI at [facility] noncontrast revealed right axillary mass 3.7 cm, subpectoral lymph nodes, left supraclavicular lymph node, multiple level 2 and 3 lymph nodes in the right axilla, 2 breast masses 1.1 cm largest size.  Mammogram in [place] revealed 3 small masses at 12:00 1.2 cm, 0.6 cm, 0.9 cm and 5 abnormal axillary lymph nodes, pleomorphic calcifications measuring 10 cm, biopsy revealed grade 2 IDC with necrosis and calcifications ER 30%, PR 0%, HER2 positive, Ki-67 30 to 40%   12/15/2020 Cancer Staging   Staging form: Breast, AJCC 8th Edition - Clinical: Stage IV (cT3, cN2, pM1, G2, ER+, PR-, HER2+) - Signed by Gudena, Vinay, MD on 12/15/2020 Histologic grading system: 3 grade system   12/24/2020 - 02/23/2021 Chemotherapy   Patient is on Treatment Plan : BREAST Adjuvant AC q21d     12/29/2020 Genetic Testing   Negative hereditary cancer genetic testing: no pathogenic variants detected in [facility] BRCAPlus Panel or [facility] CustomNext-Cancer +RNAinsight Panel.  The report dates are 12/23/2020 and 12/29/2020.   The BRCAplus panel offered by [facility] and includes sequencing and deletion/duplication analysis for the following 8 genes: ATM, BRCA1,  BRCA2, CDH1, CHEK2, PALB2, PTEN, and TP53.  The CustomNext-Cancer+RNAinsight panel offered by [facility] includes sequencing and rearrangement analysis for the following 47 genes:  APC, ATM, AXIN2, BARD1, BMPR1A, BRCA1, BRCA2, BRIP1, CDH1, CDK4, CDKN2A, CHEK2, DICER1, EPCAM, GREM1, HOXB13, MEN1, MLH1, MSH2, MSH3, MSH6, MUTYH, NBN, NF1, NF2, NTHL1, PALB2, PMS2, POLD1, POLE, PTEN, RAD51C, RAD51D, RECQL, RET, SDHA, SDHAF2, SDHB, SDHC, SDHD, SMAD4, SMARCA4, STK11, TP53, TSC1, TSC2, and VHL.  RNA data is routinely analyzed for use in variant interpretation for all genes.   03/25/2021 - 06/30/2021 Chemotherapy   Patient is on Treatment Plan : BREAST AC q21 days / Trastuzumab + Pertuzumab + Weekly Paclitaxel q21d     07/29/2021 -  Chemotherapy   Patient is on Treatment Plan : BREAST ADO-Trastuzumab Emtansine (Kadcyla) q21d       CHIEF COMPLIANT: Follow-up to discuss adjuvant treatment options  INTERVAL HISTORY: Colleene N Newsham is a 36-year-old above-mentioned history of breast cancer who is HER2 positive and she previously received Taxol Herceptin Perjeta and is here today to discuss surgical options after completion of right mastectomy.  She was presented in the multidisciplinary tumor board.  She is recovering very well from surgery without too much pain or discomfort.  She had a small amount of residual breast cancer 0.8 cm.  ALLERGIES:  has No Known Allergies.  MEDICATIONS:  Current Outpatient Medications  Medication Sig Dispense Refill   carvedilol (COREG) 25 MG tablet Take 1 tablet (25 mg total) by mouth 2 (two) times daily. 180 tablet 3   Iron-FA-B Cmp-C-Biot-Probiotic (FUSION PLUS) CAPS Take 1 capsule by mouth daily.     lidocaine-prilocaine (EMLA) cream Apply 1 application. topically as needed (apply once weekly prior   to port a cath needle insertion.). 30 g 0   losartan (COZAAR) 25 MG tablet Take 1 tablet (25 mg total) by mouth daily. 30 tablet 3   No current facility-administered medications  for this visit.    PHYSICAL EXAMINATION: ECOG PERFORMANCE STATUS: 1 - Symptomatic but completely ambulatory  Vitals:   07/22/21 1153  BP: (!) 142/76  Pulse: 71  Resp: 18  Temp: (!) 97.2 F (36.2 C)  SpO2: 100%   Filed Weights   07/22/21 1153  Weight: 279 lb (126.6 kg)      LABORATORY DATA:  I have reviewed the data as listed    Latest Ref Rng & Units 06/30/2021    9:24 AM 06/09/2021    9:37 AM 06/02/2021    9:32 AM  CMP  Glucose 70 - 99 mg/dL 101  121  113   BUN 6 - 20 mg/dL _0 Creatinine 0.44 - 1.00 mg/dL 0.88  0.90  0.78   Sodium 135 - 145 mmol/L 139  139  140   Potassium 3.5 - 5.1 mmol/L 3.9  3.8  3.5   Chloride 98 - 111 mmol/L 104  104  105   CO2 22 - 32 mmol/L _1 Calcium 8.9 - 10.3 mg/dL 10.0  9.6  9.3   Total Protein 6.5 - 8.1 g/dL 7.6  7.8  7.3   Total Bilirubin 0.3 - 1.2 mg/dL 0.4  0.4  0.4   Alkaline Phos 38 - 126 U/L 107  110  92   AST 15 - 41 U/L _2 ALT 0 - 44 U/L 45  40  37     Lab Results  Component Value Date   WBC 4.0 06/30/2021   HGB 11.3 (L) 06/30/2021   HCT 34.1 (L) 06/30/2021   MCV 81.8 06/30/2021   PLT 320 06/30/2021   NEUTROABS 2.6 06/30/2021    ASSESSMENT & PLAN:  Malignant neoplasm of upper-outer quadrant of right breast in female, estrogen receptor positive ([facility]) 12/08/2020:21-week pregnancy: MRI at [facility] noncontrast revealed right axillary mass 3.7 cm, subpectoral lymph nodes, left supraclavicular lymph node, multiple level 2 and 3 lymph nodes in the right axilla, 2 breast masses 1.1 cm largest size.  Mammogram in [place] revealed 3 small masses at 12:00 1.2 cm, 0.6 cm, 0.9 cm and 5 abnormal axillary lymph nodes, pleomorphic calcifications measuring 10 cm, biopsy revealed grade 2 IDC with necrosis and calcifications ER 30%, PR 0%, HER2 positive, Ki-67 30 to 40%   PET/CT 03/24/2021: Multifocal right breast cancer, hypermetabolic right axillary, subpectoral, bilateral supraclavicular and mediastinal lymph  nodes   Treatment plan: 1.  Neoadjuvant chemotherapy with Adriamycin and Cytoxan every 3 weeks x4 started 12/24/2020 completed 02/23/2021 2. delivery of the baby 3.  Continue neoadjuvant therapy with Taxol Herceptin and Perjeta (Taxol completed 06/09/21) 4.  Mastectomy with ALND:  07/05/21: 8 mm residual HG IDC with LVI, Margins Neg 5.  Adjuvant radiation therapy (radiation oncology is not certain of benefits of radiation given PET/CT findings) 6.  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  Treatment plan: With the residual disease after neoadj chemo, I recommended Kadcyla followed by OFS +AI (after completion of radiation)  Return  to clinic in 1 week to start treatment.    Orders Placed This Encounter  Procedures   CBC with Differential ([facility] Only)    Standing Status:   Standing    Number of Occurrences:   20    Standing Expiration Date:   07/23/2022   CMP ([facility] only)    Standing Status:   Standing    Number of Occurrences:   20    Standing Expiration Date:   07/23/2022   CBC with Differential    Standing Status:   Standing    Number of Occurrences:   20    Standing Expiration Date:   07/23/2022   Comprehensive metabolic panel    Standing Status:   Standing    Number of Occurrences:   20    Standing Expiration Date:   07/23/2022   PHYSICIAN COMMUNICATION ORDER    A baseline Echo/Muga should be obtained prior to initiation of Ado-trastuzumab emtansine, at 3, 6, 9 months during Ado-trastuzumab emtansine treatment.   The patient has a good understanding of the overall plan. she agrees with it. she  will call with any problems that may develop before the next visit here. Total time spent: 30 mins including face to face time and time spent for planning, charting and co-ordination of care   Harriette Ohara, MD 07/22/21    I Gardiner Coins am scribing for Dr. Lindi Adie  I have reviewed the above documentation for accuracy and completeness, and I agree with the above.

## 2021-07-12 ENCOUNTER — Encounter: Payer: Self-pay | Admitting: *Deleted

## 2021-07-14 ENCOUNTER — Encounter: Payer: Self-pay | Admitting: Hematology and Oncology

## 2021-07-14 ENCOUNTER — Other Ambulatory Visit: Payer: Self-pay | Admitting: *Deleted

## 2021-07-14 DIAGNOSIS — Z170 Estrogen receptor positive status [ER+]: Secondary | ICD-10-CM

## 2021-07-19 ENCOUNTER — Ambulatory Visit
Admission: RE | Admit: 2021-07-19 | Discharge: 2021-07-19 | Disposition: A | Discharge status: Home / Self Care | Payer: BC Managed Care – PPO | Source: Ambulatory Visit | Attending: Radiation Oncology | Admitting: Radiation Oncology

## 2021-07-19 ENCOUNTER — Other Ambulatory Visit: Payer: Self-pay

## 2021-07-19 ENCOUNTER — Ambulatory Visit
Admission: RE | Admit: 2021-07-19 | Discharge: 2021-07-19 | Disposition: A | Discharge status: Home / Self Care | Payer: BC Managed Care – PPO | Source: Ambulatory Visit | Attending: Family Medicine | Admitting: Family Medicine

## 2021-07-19 ENCOUNTER — Encounter: Payer: Self-pay | Admitting: Radiation Oncology

## 2021-07-19 VITALS — BP 144/101 | HR 65 | Temp 98.3°F | Resp 18 | Ht 64.0 in | Wt 278.5 lb

## 2021-07-19 DIAGNOSIS — C50411 Malignant neoplasm of upper-outer quadrant of right female breast: Secondary | ICD-10-CM | POA: Insufficient documentation

## 2021-07-19 DIAGNOSIS — Z170 Estrogen receptor positive status [ER+]: Secondary | ICD-10-CM | POA: Diagnosis not present

## 2021-07-19 DIAGNOSIS — Z79899 Other long term (current) drug therapy: Secondary | ICD-10-CM | POA: Insufficient documentation

## 2021-07-19 DIAGNOSIS — Z803 Family history of malignant neoplasm of breast: Secondary | ICD-10-CM | POA: Insufficient documentation

## 2021-07-19 LAB — PREGNANCY, URINE: Preg Test, Ur: NEGATIVE

## 2021-07-20 ENCOUNTER — Other Ambulatory Visit: Payer: Self-pay | Admitting: Hematology and Oncology

## 2021-07-20 DIAGNOSIS — C50919 Malignant neoplasm of unspecified site of unspecified female breast: Secondary | ICD-10-CM

## 2021-07-22 ENCOUNTER — Inpatient Hospital Stay (HOSPITAL_BASED_OUTPATIENT_CLINIC_OR_DEPARTMENT_OTHER): Payer: BC Managed Care – PPO | Admitting: Hematology and Oncology

## 2021-07-22 ENCOUNTER — Other Ambulatory Visit: Payer: Self-pay

## 2021-07-22 VITALS — BP 142/76 | HR 71 | Temp 97.2°F | Resp 18 | Ht 64.0 in | Wt 279.0 lb

## 2021-07-22 DIAGNOSIS — C50411 Malignant neoplasm of upper-outer quadrant of right female breast: Secondary | ICD-10-CM | POA: Diagnosis not present

## 2021-07-22 DIAGNOSIS — Z170 Estrogen receptor positive status [ER+]: Secondary | ICD-10-CM

## 2021-07-22 DIAGNOSIS — Z5112 Encounter for antineoplastic immunotherapy: Secondary | ICD-10-CM | POA: Diagnosis not present

## 2021-07-22 NOTE — Progress Notes (Signed)
DISCONTINUE OFF PATHWAY REGIMEN - Breast   Custom Intervention:Medical: [Paclitaxel, Trastuzumab and hyaluronidase-oysk, Pertuzumab]:     Paclitaxel      Trastuzumab and hyaluronidase-oysk      Pertuzumab   **Always confirm dose/schedule in your pharmacy ordering system**  REASON: Other Reason PRIOR TREATMENT: Off Pathway: Medical: [Paclitaxel, Trastuzumab and hyaluronidase-oysk, Pertuzumab] TREATMENT RESPONSE: Partial Response (PR)  START ON PATHWAY REGIMEN - Breast     A cycle is every 21 days:     Ado-trastuzumab emtansine   **Always confirm dose/schedule in your pharmacy ordering system**  Patient Characteristics: Post-Neoadjuvant Therapy and Resection, HER2 Positive, ER Positive, Residual Disease, Adjuvant Targeted Therapy After Neoadjuvant Chemo/Targeted Therapy Therapeutic Status: Post-Neoadjuvant Therapy and Resection Residual Invasive Disease Post-Neoadjuvant Therapy<= Yes ER Status: Positive (+) HER2 Status: Positive (+) PR Status: Negative (-) Intent of Therapy: Curative Intent, Discussed with Patient

## 2021-07-22 NOTE — Assessment & Plan Note (Signed)
12/08/2020:21-week pregnancy: MRI at [facility] noncontrast revealed right axillary mass 3.7 cm, subpectoral lymph nodes, left supraclavicular lymph node, multiple level 2 and 3 lymph nodes in the right axilla, 2 breast masses 1.1 cm largest size. Mammogram in [place] revealed 3 small masses at 12:00 1.2 cm, 0.6 cm, 0.9 cm and 5 abnormal axillary lymph nodes, pleomorphic calcifications measuring 10 cm, biopsy revealed grade 2 IDC with necrosis and calcifications ER 30%, PR 0%, HER2 positive, Ki-67 30 to 40%  PET/CT 03/24/2021: Multifocal right breast cancer, hypermetabolic right axillary, subpectoral, bilateral supraclavicular and mediastinal lymph nodes  Treatment plan: 1. Neoadjuvant chemotherapy with Adriamycin and Cytoxan every 3 weeks x4started 12/24/2020 completed 02/23/2021 2. delivery of the baby 3. Continue neoadjuvant therapy with Taxol Herceptin and Perjeta (Taxol completed 06/09/21) 4. Mastectomy with ALND:  07/05/21: 8 mm residual HG IDC with LVI, Margins Neg 5. Adjuvant radiation therapy (radiation oncology is not certain of benefits of radiation given PET/CT findings) 6. Adjuvant antiestrogen therapy with ovarian function suppression Genetic testing ---------------------------------------------------------------------------------------------------------------------------------- [name] has delivered a healthy boy([name])  Tumor Board discussion: Because of the inguinal LN , we are requesting another PET scan to evaluate for distant mets to make sure she would benefit from XRT.  Treatment plan: With the residual disease after neoadj chemo, I recommended Kadcyla followed by OFS +AI

## 2021-07-25 ENCOUNTER — Telehealth: Payer: Self-pay | Admitting: Hematology and Oncology

## 2021-07-25 ENCOUNTER — Telehealth: Payer: Self-pay | Admitting: Adult Health

## 2021-07-25 NOTE — Progress Notes (Signed)
Received a call from Neoma Laming, nurse at Dr [facility] office, requesting consult notes on mutual pt.  Neoma Laming states they have not received any consult notes to date and they referred pt to [place] so they should be able to request such.  I apologized to Neoma Laming, checked fax number on file is correct, MD is listed as pt care team - I notified management of the issue and faxed over consult notes since pt last d/c from delivery

## 2021-07-25 NOTE — Telephone Encounter (Signed)
Peer to peer for patient's insurance for PET scan.  Case #[id number].  After discussion with the nurse and then physician reviewer the case was authorized #[id number].  Dates of authorization are from July 21, 2021 through September 18, 2021.  Time spent: 15 minutes  Wilber Bihari, NP 07/25/21 3:49 PM Medical Oncology and Hematology [facility] [place], [place] [address] Tel. [phone]    Fax. [phone]

## 2021-07-25 NOTE — Telephone Encounter (Signed)
Scheduled appointment per 6/30 los. Patient is aware.

## 2021-07-27 ENCOUNTER — Encounter (HOSPITAL_COMMUNITY): Payer: BC Managed Care – PPO

## 2021-07-27 ENCOUNTER — Telehealth: Payer: Self-pay | Admitting: *Deleted

## 2021-07-27 ENCOUNTER — Other Ambulatory Visit (HOSPITAL_COMMUNITY): Payer: BC Managed Care – PPO

## 2021-07-27 NOTE — Telephone Encounter (Signed)
[employer] Disability/FMLA paperwork completed by this nurse today currently placed in designated mail bin for collaborative pick up.  Awaiting provider review and signature.

## 2021-07-28 ENCOUNTER — Encounter: Payer: Self-pay | Admitting: *Deleted

## 2021-07-29 ENCOUNTER — Encounter: Payer: Self-pay | Admitting: Internal Medicine

## 2021-07-29 ENCOUNTER — Inpatient Hospital Stay: Payer: BC Managed Care – PPO | Attending: Hematology and Oncology

## 2021-07-29 ENCOUNTER — Other Ambulatory Visit: Payer: Self-pay

## 2021-07-29 ENCOUNTER — Telehealth: Payer: Self-pay | Admitting: Adult Health

## 2021-07-29 ENCOUNTER — Inpatient Hospital Stay: Payer: BC Managed Care – PPO

## 2021-07-29 VITALS — BP 146/92 | HR 71 | Temp 98.7°F | Resp 18 | Wt 277.2 lb

## 2021-07-29 DIAGNOSIS — R197 Diarrhea, unspecified: Secondary | ICD-10-CM | POA: Diagnosis not present

## 2021-07-29 DIAGNOSIS — C50411 Malignant neoplasm of upper-outer quadrant of right female breast: Secondary | ICD-10-CM | POA: Diagnosis present

## 2021-07-29 DIAGNOSIS — Z170 Estrogen receptor positive status [ER+]: Secondary | ICD-10-CM | POA: Insufficient documentation

## 2021-07-29 DIAGNOSIS — R112 Nausea with vomiting, unspecified: Secondary | ICD-10-CM | POA: Insufficient documentation

## 2021-07-29 DIAGNOSIS — Z5112 Encounter for antineoplastic immunotherapy: Secondary | ICD-10-CM | POA: Insufficient documentation

## 2021-07-29 DIAGNOSIS — Z95828 Presence of other vascular implants and grafts: Secondary | ICD-10-CM

## 2021-07-29 LAB — CMP (CANCER CENTER ONLY)
ALT: 30 U/L (ref 0–44)
AST: 22 U/L (ref 15–41)
Albumin: 3.6 g/dL (ref 3.5–5.0)
Alkaline Phosphatase: 151 U/L — ABNORMAL HIGH (ref 38–126)
Anion gap: 6 (ref 5–15)
BUN: 12 mg/dL (ref 6–20)
CO2: 27 mmol/L (ref 22–32)
Calcium: 9.2 mg/dL (ref 8.9–10.3)
Chloride: 103 mmol/L (ref 98–111)
Creatinine: 0.73 mg/dL (ref 0.44–1.00)
GFR, Estimated: >60 mL/min (ref >60)
Glucose, Bld: 89 mg/dL (ref 70–99)
Potassium: 4.3 mmol/L (ref 3.5–5.1)
Sodium: 136 mmol/L (ref 135–145)
Total Bilirubin: 0.3 mg/dL (ref 0.3–1.2)
Total Protein: 7.3 g/dL (ref 6.5–8.1)

## 2021-07-29 LAB — CBC WITH DIFFERENTIAL (CANCER CENTER ONLY)
Abs Immature Granulocytes: 0.01 10*3/uL (ref 0.00–0.07)
Basophils Absolute: 0.1 10*3/uL (ref 0.0–0.1)
Basophils Relative: 1 %
Eosinophils Absolute: 0.1 10*3/uL (ref 0.0–0.5)
Eosinophils Relative: 2 %
HCT: 29.6 % — ABNORMAL LOW (ref 36.0–46.0)
Hemoglobin: 9.7 g/dL — ABNORMAL LOW (ref 12.0–15.0)
Immature Granulocytes: 0 %
Lymphocytes Relative: 19 %
Lymphs Abs: 0.9 10*3/uL (ref 0.7–4.0)
MCH: 26.6 pg (ref 26.0–34.0)
MCHC: 32.8 g/dL (ref 30.0–36.0)
MCV: 81.1 fL (ref 80.0–100.0)
Monocytes Absolute: 0.5 10*3/uL (ref 0.1–1.0)
Monocytes Relative: 10 %
Neutro Abs: 3.3 10*3/uL (ref 1.7–7.7)
Neutrophils Relative %: 68 %
Platelet Count: 393 10*3/uL (ref 150–400)
RBC: 3.65 MIL/uL — ABNORMAL LOW (ref 3.87–5.11)
RDW: 14.3 % (ref 11.5–15.5)
WBC Count: 4.9 10*3/uL (ref 4.0–10.5)
nRBC: 0 % (ref 0.0–0.2)

## 2021-07-29 MED ORDER — DIPHENHYDRAMINE HCL 25 MG PO CAPS
50.0000 mg | ORAL_CAPSULE | Freq: Once | ORAL | Status: AC
  Administered 2021-07-29: 50 mg via ORAL
  Filled 2021-07-29: qty 2

## 2021-07-29 MED ORDER — SODIUM CHLORIDE 0.9% FLUSH
10.0000 mL | INTRAVENOUS | Status: DC | PRN
  Administered 2021-07-29: 10 mL

## 2021-07-29 MED ORDER — LOSARTAN POTASSIUM 25 MG PO TABS: 50.0000 mg | ORAL_TABLET | Freq: Every day | ORAL | 1 refills | Status: DC

## 2021-07-29 MED ORDER — SODIUM CHLORIDE 0.9 % IV SOLN
3.6000 mg/kg | Freq: Once | INTRAVENOUS | Status: AC
  Administered 2021-07-29: 460 mg via INTRAVENOUS
  Filled 2021-07-29: qty 8

## 2021-07-29 MED ORDER — ACETAMINOPHEN 325 MG PO TABS
650.0000 mg | ORAL_TABLET | Freq: Once | ORAL | Status: AC
  Administered 2021-07-29: 650 mg via ORAL
  Filled 2021-07-29: qty 2

## 2021-07-29 MED ORDER — HEPARIN SOD (PORK) LOCK FLUSH 100 UNIT/ML IV SOLN
500.0000 [IU] | Freq: Once | INTRAVENOUS | Status: AC | PRN
  Administered 2021-07-29: 500 [IU]

## 2021-07-29 MED ORDER — SODIUM CHLORIDE 0.9 % IV SOLN: Freq: Once | INTRAVENOUS | Status: AC

## 2021-07-29 MED ORDER — SODIUM CHLORIDE 0.9% FLUSH
10.0000 mL | Freq: Once | INTRAVENOUS | Status: AC
  Administered 2021-07-29: 10 mL

## 2021-07-29 NOTE — Progress Notes (Signed)
Patient completed infusion and 90 minute post observation without issue. Vitals stable upon discharge. No complaints voiced.

## 2021-07-29 NOTE — Patient Instructions (Signed)
[facility] MEDICAL ONCOLOGY   Discharge Instructions: Thank you for choosing [facility] to provide your oncology and hematology care.   If you have a lab appointment with the Cancer Center, please go directly to the Cancer Center and check in at the registration area.   Wear comfortable clothing and clothing appropriate for easy access to any Portacath or PICC line.   We strive to give you quality time with your provider. You may need to reschedule your appointment if you arrive late (15 or more minutes).  Arriving late affects you and other patients whose appointments are after yours.  Also, if you miss three or more appointments without notifying the office, you may be dismissed from the clinic at the provider's discretion.      For prescription refill requests, have your pharmacy contact our office and allow 72 hours for refills to be completed.    Today you received the following chemotherapy and/or immunotherapy agents: ado-trastuzumab emtansine      To help prevent nausea and vomiting after your treatment, we encourage you to take your nausea medication as directed.  BELOW ARE SYMPTOMS THAT SHOULD BE REPORTED IMMEDIATELY: *FEVER GREATER THAN 100.4 F (38 C) OR HIGHER *CHILLS OR SWEATING *NAUSEA AND VOMITING THAT IS NOT CONTROLLED WITH YOUR NAUSEA MEDICATION *UNUSUAL SHORTNESS OF BREATH *UNUSUAL BRUISING OR BLEEDING *URINARY PROBLEMS (pain or burning when urinating, or frequent urination) *BOWEL PROBLEMS (unusual diarrhea, constipation, pain near the anus) TENDERNESS IN MOUTH AND THROAT WITH OR WITHOUT PRESENCE OF ULCERS (sore throat, sores in mouth, or a toothache) UNUSUAL RASH, SWELLING OR PAIN  UNUSUAL VAGINAL DISCHARGE OR ITCHING   Items with * indicate a potential emergency and should be followed up as soon as possible or go to the Emergency Department if any problems should occur.  Please show the CHEMOTHERAPY ALERT CARD or IMMUNOTHERAPY ALERT  CARD at check-in to the Emergency Department and triage nurse.  Should you have questions after your visit or need to cancel or reschedule your appointment, please contact [facility] MEDICAL ONCOLOGY  Dept: [phone]  and follow the prompts.  Office hours are 8:00 a.m. to 4:30 p.m. Monday - Friday. Please note that voicemails left after 4:00 p.m. may not be returned until the following business day.  We are closed weekends and major holidays. You have access to a nurse at all times for urgent questions. Please call the main number to the clinic Dept: [phone] and follow the prompts.   For any non-urgent questions, you may also contact your provider using MyChart. We now offer e-Visits for anyone 18 and older to request care online for non-urgent symptoms. For details visit [email or web address].   Also download the MyChart app! Go to the app store, search "MyChart", open the app, select [facility], and log in with your MyChart username and password.  Masks are optional in the cancer centers. If you would like for your care team to wear a mask while they are taking care of you, please let them know. For doctor visits, patients may have with them one support person who is at least 34 years old. At this time, visitors are not allowed in the infusion area. 

## 2021-07-29 NOTE — Telephone Encounter (Signed)
I attempted to call patient insurance company through [place] about denied PET scan through her primary insurance.  I was informed that [facility] does not participate in peer to peer's but an appeal could be requested.  I was then forwarded to the appeals line and left a voicemail with the requested information.  Time spent: 20 minutes  Wilber Bihari, NP 07/29/21 1:58 PM Medical Oncology and Hematology [facility] [place], [place] [address] Tel. [phone]    Fax. [phone]

## 2021-08-08 ENCOUNTER — Telehealth: Payer: Self-pay | Admitting: Internal Medicine

## 2021-08-08 ENCOUNTER — Encounter (HOSPITAL_COMMUNITY)
Admission: RE | Admit: 2021-08-08 | Discharge: 2021-08-08 | Disposition: A | Discharge status: Home / Self Care | Payer: BC Managed Care – PPO | Source: Ambulatory Visit | Attending: Hematology and Oncology | Admitting: Hematology and Oncology

## 2021-08-08 DIAGNOSIS — C50919 Malignant neoplasm of unspecified site of unspecified female breast: Secondary | ICD-10-CM | POA: Insufficient documentation

## 2021-08-08 LAB — GLUCOSE, CAPILLARY: Glucose-Capillary: 89 mg/dL (ref 70–99)

## 2021-08-08 MED ORDER — FLUDEOXYGLUCOSE F - 18 (FDG) INJECTION
12.5000 | Freq: Once | INTRAVENOUS | Status: AC | PRN
  Administered 2021-08-08: 13.39 via INTRAVENOUS

## 2021-08-08 NOTE — Telephone Encounter (Signed)
Last OV note sent to Dr.Cousins office.   Thanks!

## 2021-08-08 NOTE — Telephone Encounter (Signed)
Dr's [place] office is requesting office notes from where they referred pt to clinic. Requesting the appt notes be faxed to fax # [phone].

## 2021-08-09 ENCOUNTER — Encounter: Payer: Self-pay | Admitting: *Deleted

## 2021-08-11 ENCOUNTER — Encounter: Payer: Self-pay | Admitting: *Deleted

## 2021-08-12 ENCOUNTER — Ambulatory Visit
Admission: RE | Admit: 2021-08-12 | Discharge: 2021-08-12 | Disposition: A | Discharge status: Home / Self Care | Payer: BC Managed Care – PPO | Source: Ambulatory Visit | Attending: Radiation Oncology | Admitting: Radiation Oncology

## 2021-08-12 ENCOUNTER — Inpatient Hospital Stay (HOSPITAL_BASED_OUTPATIENT_CLINIC_OR_DEPARTMENT_OTHER): Payer: BC Managed Care – PPO | Admitting: Physician Assistant

## 2021-08-12 ENCOUNTER — Inpatient Hospital Stay: Payer: BC Managed Care – PPO

## 2021-08-12 ENCOUNTER — Other Ambulatory Visit: Payer: Self-pay

## 2021-08-12 ENCOUNTER — Other Ambulatory Visit: Payer: Self-pay | Admitting: Physician Assistant

## 2021-08-12 ENCOUNTER — Other Ambulatory Visit: Payer: Self-pay | Admitting: *Deleted

## 2021-08-12 VITALS — BP 101/69 | HR 65 | Temp 98.4°F | Resp 16 | Wt 263.1 lb

## 2021-08-12 DIAGNOSIS — R197 Diarrhea, unspecified: Secondary | ICD-10-CM

## 2021-08-12 DIAGNOSIS — Z95828 Presence of other vascular implants and grafts: Secondary | ICD-10-CM | POA: Diagnosis not present

## 2021-08-12 DIAGNOSIS — Z170 Estrogen receptor positive status [ER+]: Secondary | ICD-10-CM

## 2021-08-12 DIAGNOSIS — C50411 Malignant neoplasm of upper-outer quadrant of right female breast: Secondary | ICD-10-CM

## 2021-08-12 DIAGNOSIS — Z5112 Encounter for antineoplastic immunotherapy: Secondary | ICD-10-CM | POA: Diagnosis not present

## 2021-08-12 DIAGNOSIS — R112 Nausea with vomiting, unspecified: Secondary | ICD-10-CM

## 2021-08-12 LAB — CMP (CANCER CENTER ONLY)
ALT: 65 U/L — ABNORMAL HIGH (ref 0–44)
AST: 59 U/L — ABNORMAL HIGH (ref 15–41)
Albumin: 4.1 g/dL (ref 3.5–5.0)
Alkaline Phosphatase: 136 U/L — ABNORMAL HIGH (ref 38–126)
Anion gap: 10 (ref 5–15)
BUN: 17 mg/dL (ref 6–20)
CO2: 27 mmol/L (ref 22–32)
Calcium: 9.2 mg/dL (ref 8.9–10.3)
Chloride: 99 mmol/L (ref 98–111)
Creatinine: 1.26 mg/dL — ABNORMAL HIGH (ref 0.44–1.00)
GFR, Estimated: 57 mL/min — ABNORMAL LOW (ref >60)
Glucose, Bld: 102 mg/dL — ABNORMAL HIGH (ref 70–99)
Potassium: 3.6 mmol/L (ref 3.5–5.1)
Sodium: 136 mmol/L (ref 135–145)
Total Bilirubin: 0.4 mg/dL (ref 0.3–1.2)
Total Protein: 8.2 g/dL — ABNORMAL HIGH (ref 6.5–8.1)

## 2021-08-12 LAB — CBC WITH DIFFERENTIAL (CANCER CENTER ONLY)
Abs Immature Granulocytes: 0.03 10*3/uL (ref 0.00–0.07)
Basophils Absolute: 0 10*3/uL (ref 0.0–0.1)
Basophils Relative: 1 %
Eosinophils Absolute: 0.1 10*3/uL (ref 0.0–0.5)
Eosinophils Relative: 1 %
HCT: 36.4 % (ref 36.0–46.0)
Hemoglobin: 11.8 g/dL — ABNORMAL LOW (ref 12.0–15.0)
Immature Granulocytes: 1 %
Lymphocytes Relative: 26 %
Lymphs Abs: 0.9 10*3/uL (ref 0.7–4.0)
MCH: 25.7 pg — ABNORMAL LOW (ref 26.0–34.0)
MCHC: 32.4 g/dL (ref 30.0–36.0)
MCV: 79.3 fL — ABNORMAL LOW (ref 80.0–100.0)
Monocytes Absolute: 0.9 10*3/uL (ref 0.1–1.0)
Monocytes Relative: 24 %
Neutro Abs: 1.7 10*3/uL (ref 1.7–7.7)
Neutrophils Relative %: 47 %
Platelet Count: 344 10*3/uL (ref 150–400)
RBC: 4.59 MIL/uL (ref 3.87–5.11)
RDW: 14.8 % (ref 11.5–15.5)
WBC Count: 3.6 10*3/uL — ABNORMAL LOW (ref 4.0–10.5)
nRBC: 0 % (ref 0.0–0.2)

## 2021-08-12 LAB — SAMPLE TO BLOOD BANK

## 2021-08-12 LAB — MAGNESIUM: Magnesium: 1.7 mg/dL (ref 1.7–2.4)

## 2021-08-12 MED ORDER — ONDANSETRON HCL 4 MG/2ML IJ SOLN
8.0000 mg | Freq: Once | INTRAMUSCULAR | Status: AC
  Administered 2021-08-12: 8 mg via INTRAVENOUS
  Filled 2021-08-12: qty 4

## 2021-08-12 MED ORDER — HEPARIN SOD (PORK) LOCK FLUSH 100 UNIT/ML IV SOLN
500.0000 [IU] | Freq: Once | INTRAVENOUS | Status: AC
  Administered 2021-08-12: 500 [IU] via INTRAVENOUS

## 2021-08-12 MED ORDER — SODIUM CHLORIDE 0.9% FLUSH
10.0000 mL | Freq: Once | INTRAVENOUS | Status: AC
  Administered 2021-08-12: 10 mL via INTRAVENOUS

## 2021-08-12 MED ORDER — SODIUM CHLORIDE 0.9 % IV SOLN: 8.0000 mg | Freq: Once | INTRAVENOUS | Status: DC

## 2021-08-12 MED ORDER — SODIUM CHLORIDE 0.9 % IV SOLN: Freq: Once | INTRAVENOUS | Status: AC

## 2021-08-12 NOTE — Progress Notes (Signed)
Received call from pt with complaint of nausea, vomiting, diarrhea x several days along with dehydration and weight loss.  Per MD pt needing to be seen in [facility] for further evaluation and tx.  Appt scheduled and pt notified.

## 2021-08-12 NOTE — Patient Instructions (Signed)

## 2021-08-12 NOTE — Progress Notes (Signed)
Stool cultures orderd

## 2021-08-12 NOTE — Progress Notes (Signed)
Symptom Management Consult note [place]    Patient Care Team: [name], MD as PCP - General (Family Medicine) Harl Bowie Royetta Crochet, MD as PCP - Cardiology (Cardiology) Nicholas Lose, MD as Consulting Physician (Hematology and Oncology) Servando Salina, MD as Consulting Physician (Obstetrics and Gynecology) Irene Limbo, MD as Consulting Physician (Plastic Surgery) Rolm Bookbinder, MD as Consulting Physician (General Surgery)    Name of the patient: Evelyn Marshall  [id number]  Jun 17, 1985   Date of visit: 08/12/2021    Chief complaint/ Reason for visit- vomiting and diarrhea  Oncology History  Malignant neoplasm of upper-outer quadrant of right breast in female, estrogen receptor positive ([place])  12/08/2020 Initial Diagnosis   21-week pregnancy: MRI at [place] noncontrast revealed right axillary mass 3.7 cm, subpectoral lymph nodes, left supraclavicular lymph node, multiple level 2 and 3 lymph nodes in the right axilla, 2 breast masses 1.1 cm largest size.  Mammogram in [place] revealed 3 small masses at 12:00 1.2 cm, 0.6 cm, 0.9 cm and 5 abnormal axillary lymph nodes, pleomorphic calcifications measuring 10 cm, biopsy revealed grade 2 IDC with necrosis and calcifications ER 30%, PR 0%, HER2 positive, Ki-67 30 to 40%   12/15/2020 Cancer Staging   Staging form: Breast, AJCC 8th Edition - Clinical: Stage IV (cT3, cN2, pM1, G2, ER+, PR-, HER2+) - Signed by Nicholas Lose, MD on 12/15/2020 Histologic grading system: 3 grade system   12/24/2020 - 02/23/2021 Chemotherapy   Patient is on Treatment Plan : BREAST Adjuvant AC q21d     12/29/2020 Genetic Testing   Negative hereditary cancer genetic testing: no pathogenic variants detected in [employer] BRCAPlus Panel or [employer] CustomNext-Cancer +RNAinsight Panel.  The report dates are 12/23/2020 and 12/29/2020.   The BRCAplus panel offered by [employer] and includes sequencing and deletion/duplication analysis for the  following 8 genes: ATM, BRCA1, BRCA2, CDH1, CHEK2, PALB2, PTEN, and TP53.  The CustomNext-Cancer+RNAinsight panel offered by [employer] includes sequencing and rearrangement analysis for the following 47 genes:  APC, ATM, AXIN2, BARD1, BMPR1A, BRCA1, BRCA2, BRIP1, CDH1, CDK4, CDKN2A, CHEK2, DICER1, EPCAM, GREM1, HOXB13, MEN1, MLH1, MSH2, MSH3, MSH6, MUTYH, NBN, NF1, NF2, NTHL1, PALB2, PMS2, POLD1, POLE, PTEN, RAD51C, RAD51D, RECQL, RET, SDHA, SDHAF2, SDHB, SDHC, SDHD, SMAD4, SMARCA4, STK11, TP53, TSC1, TSC2, and VHL.  RNA data is routinely analyzed for use in variant interpretation for all genes.   03/25/2021 - 06/30/2021 Chemotherapy   Patient is on Treatment Plan : BREAST AC q21 days / Trastuzumab + Pertuzumab + Weekly Paclitaxel q21d     07/29/2021 -  Chemotherapy   Patient is on Treatment Plan : BREAST ADO-Trastuzumab Emtansine (Kadcyla) q21d       Current Therapy:  Kadcyla  Last treatment:    07/29/21  Day 1   Cycle 1  Interval history- Evelyn Marshall is a 36 y.o. with oncologic history as above presenting to [facility] today with chief complaint of nausea, vomiting, and diarrhea x 2 days.  Patient reports her sister-in-law had the stomach bug earlier in the week and then her children started to develop the same symptoms of nausea vomiting and diarrhea.  Patient states in the last 24 hours she has had 4 episodes of nonbloody diarrhea.  She describes her stool as liquid and ranging from clear to brown in color.  She has had 1 episode of vomiting today.  She took Zofran this morning around 6 AM and Pepto-Bismol prior to arrival today.  She feels like Zofran is controlling nausea  overall.  She has had decreased p.o. intake secondary to her symptoms.  She admits to only sips of water yesterday not much food intake.  She did have normalized abdominal pain on day 1 of symptoms that has since resolved.  Patient also reports that she started her menstrual cycle as well.  This is her first one postpartum.  Denies  any recent travel or suspicious food intake.  Denies any recent antibiotic use.  She denies any fever, chills, sore throat, chest pain, shortness of breath, back pain, urinary symptoms, rash.    ROS  All other systems are reviewed and are negative for acute change except as noted in the HPI.    No Known Allergies   Past Medical History:  Diagnosis Date   Abnormal Pap smear 2012   colpo result benign   Cancer ([place])    Chlamydia    Family history of breast cancer 12/15/2020   History of pre-eclampsia    HPV (human papilloma virus) infection    Hyperemesis    Hypokalemia    Pregnancy induced hypertension    Ptyalism    Vaginal Pap smear, abnormal      Past Surgical History:  Procedure Laterality Date   BREAST REDUCTION SURGERY Left 07/05/2021   Procedure: LEFT MAMMARY REDUCTION  (BREAST);  Surgeon: Irene Limbo, MD;  Location: [place];  Service: Plastics;  Laterality: Left;   COLPOSCOPY     IR IMAGING GUIDED PORT INSERTION  12/24/2020   NODE DISSECTION Right 07/05/2021   Procedure: RIGHT AXILLARY NODE DISSECTION;  Surgeon: Rolm Bookbinder, MD;  Location: [facility];  Service: General;  Laterality: Right;   SIMPLE MASTECTOMY WITH AXILLARY SENTINEL NODE BIOPSY Right 07/05/2021   Procedure: RIGHT MASTECTOMY;  Surgeon: Rolm Bookbinder, MD;  Location: [place];  Service: General;  Laterality: Right;  GEN & PEC BLOCK   WISDOM TOOTH EXTRACTION      Social History   Socioeconomic History   Marital status: Married    Spouse name: Belenda Cruise   Number of children: 3   Years of education: 16   Highest education level: Bachelor's degree (e.g., BA, AB, BS)  Occupational History   Occupation: Pharmacist, hospital  Tobacco Use   Smoking status: Never   Smokeless tobacco: Never  Vaping Use   Vaping Use: Never used  Substance and Sexual Activity   Alcohol use: No   Drug use: No   Sexual activity: Yes  Other Topics Concern   Not on file   Social History Narrative   Not on file   Social Determinants of Health   Financial Resource Strain: Not on file  Food Insecurity: Not on file  Transportation Needs: Not on file  Physical Activity: Not on file  Stress: Not on file  Social Connections: Not on file  Intimate Partner Violence: Not on file    Family History  Problem Relation Age of Onset   Heart disease Father    Hypertension Father    Stroke Father    Breast cancer Paternal Grandmother        dx 36s   Stroke Paternal Grandmother    Breast cancer Cousin 62       maternal female cousin     Current Outpatient Medications:    carvedilol (COREG) 25 MG tablet, Take 1 tablet (25 mg total) by mouth 2 (two) times daily., Disp: 180 tablet, Rfl: 3   Iron-FA-B Cmp-C-Biot-Probiotic (FUSION PLUS) CAPS, Take 1 capsule by mouth daily., Disp: ,  Rfl:    lidocaine-prilocaine (EMLA) cream, Apply 1 application. topically as needed (apply once weekly prior to port a cath needle insertion.)., Disp: 30 g, Rfl: 0   losartan (COZAAR) 25 MG tablet, Take 2 tablets (50 mg total) by mouth daily., Disp: 180 tablet, Rfl: 1  PHYSICAL EXAM: ECOG FS:1 - Symptomatic but completely ambulatory    Vitals:   08/12/21 1021 08/12/21 1233  BP: 95/66 101/69  Pulse: 72 65  Resp: 18 16  Temp: 98.4 F (36.9 C)   TempSrc: Oral   SpO2: 97% 100%  Weight: 263 lb 1.6 oz (119.3 kg)    Physical Exam Vitals and nursing note reviewed.  Constitutional:      Appearance: She is well-developed. She is not ill-appearing or toxic-appearing.  HENT:     Head: Normocephalic and atraumatic.     Right Ear: External ear normal.     Left Ear: External ear normal.     Nose: Nose normal.  Eyes:     General: No scleral icterus.       Right eye: No discharge.        Left eye: No discharge.     Conjunctiva/sclera: Conjunctivae normal.  Neck:     Vascular: No JVD.  Cardiovascular:     Rate and Rhythm: Normal rate and regular rhythm.     Pulses: Normal pulses.      Heart sounds: Normal heart sounds.  Pulmonary:     Effort: Pulmonary effort is normal.     Breath sounds: Normal breath sounds.  Chest:     Comments: Port without signs of infection Abdominal:     General: There is no distension.     Palpations: Abdomen is soft. There is no mass.     Tenderness: There is no abdominal tenderness. There is no guarding or rebound.     Hernia: No hernia is present.  Musculoskeletal:        General: Normal range of motion.     Cervical back: Normal range of motion.  Skin:    General: Skin is warm and dry.     Capillary Refill: Capillary refill takes less than 2 seconds.  Neurological:     Mental Status: She is oriented to person, place, and time.     GCS: GCS eye subscore is 4. GCS verbal subscore is 5. GCS motor subscore is 6.     Comments: Fluent speech, no facial droop.  Psychiatric:        Behavior: Behavior normal.        LABORATORY DATA: I have reviewed the data as listed    Latest Ref Rng & Units 08/12/2021    9:52 AM 07/29/2021   11:01 AM 06/30/2021    9:24 AM  CBC  WBC 4.0 - 10.5 K/uL 3.6  4.9  4.0   Hemoglobin 12.0 - 15.0 g/dL 11.8  9.7  11.3   Hematocrit 36.0 - 46.0 % 36.4  29.6  34.1   Platelets 150 - 400 K/uL 344  393  320         Latest Ref Rng & Units 08/12/2021    9:52 AM 07/29/2021   11:01 AM 06/30/2021    9:24 AM  CMP  Glucose 70 - 99 mg/dL 102  89  101   BUN 6 - 20 mg/dL '17  12  12   ' Creatinine 0.44 - 1.00 mg/dL 1.26  0.73  0.88   Sodium 135 - 145 mmol/L 136  136  139  Potassium 3.5 - 5.1 mmol/L 3.6  4.3  3.9   Chloride 98 - 111 mmol/L 99  103  104   CO2 22 - 32 mmol/L '27  27  27   ' Calcium 8.9 - 10.3 mg/dL 9.2  9.2  10.0   Total Protein 6.5 - 8.1 g/dL 8.2  7.3  7.6   Total Bilirubin 0.3 - 1.2 mg/dL 0.4  0.3  0.4   Alkaline Phos 38 - 126 U/L 136  151  107   AST 15 - 41 U/L 59  22  30   ALT 0 - 44 U/L 65  30  45        RADIOGRAPHIC STUDIES (from last 24 hours if applicable) I have personally reviewed the  radiological images as listed and agreed with the findings in the report. No results found.     ASSESSMENT & PLAN: Patient is a 36 y.o. female  with oncologic history of malignant neoplasm of upper-outer quadrant of right breast in female, estrogen receptor positive followed by Dr. Lindi Adie.  I have viewed most recent oncology note and lab work.   #) Nausea, vomiting, diarrhea- Patient is afebrile, hemodynamically stable. She is well-appearing. On exam she has no abdominal tenderness.  Patient would likely GI viral illness based on HPI.  Patient given a liter of IV fluids and Zofran here in clinic.  She is tolerating p.o. intake.  I viewed labs from today.  She has leukopenia with a white count of 3.6, hemoglobin consistent with baseline, ANC WNL.  CMP shows no significant electrolyte derangement.  She does have elevated creatinine to 1.26, baseline appears to be around 0.8.  Likely dehydration with decreased p.o. intake and frequent diarrhea.  Patient also has elevated liver enzymes AST 59 and ALT 65.  This could be secondary to chemotherapy versus potential viral illness.  C. difficile and stool PCR panels collected.  I will follow-up with patient on results. Strict ED precautions discussed should symptoms worsen.   #) Malignant neoplasm of upper-outer quadrant of right breast in female, estrogen receptor positive - Next appointment with oncologist is 08/19/21.   Visit Diagnosis: 1. Nausea and vomiting, unspecified vomiting type   2. Port-A-Cath in place   3. Diarrhea, unspecified type   4. Malignant neoplasm of upper-outer quadrant of right breast in female, estrogen receptor positive ([place])      No orders of the defined types were placed in this encounter.   All questions were answered. The patient knows to call the clinic with any problems, questions or concerns. No barriers to learning was detected.  I have spent a total of 20 minutes minutes of face-to-face and non-face-to-face time,  preparing to see the patient, obtaining and/or reviewing separately obtained history, performing a medically appropriate examination, counseling and educating the patient, ordering tests, documenting clinical information in the electronic health record, and care coordination (communications with other health care professionals or caregivers).    Thank you for allowing me to participate in the care of this patient.    Barrie Folk, PA-C Department of Hematology/Oncology [facility] at [facility] Phone: [phone]  [phone]    08/12/2021 12:50 PM

## 2021-08-15 ENCOUNTER — Other Ambulatory Visit: Payer: Self-pay

## 2021-08-15 ENCOUNTER — Encounter: Payer: Self-pay | Admitting: Physician Assistant

## 2021-08-15 LAB — GASTROINTESTINAL PANEL BY PCR, STOOL (REPLACES STOOL CULTURE)

## 2021-08-15 NOTE — Progress Notes (Signed)
Saw patient in clinic on Friday, 08/12/2021 for nausea vomiting and diarrhea.  She had sick contacts in her household with similar symptoms.  GI stool PCR panel came back today and is positive for norovirus.  I called patient and informed her of this result.  She is feeling much improved and had 2 episodes of diarrhea today otherwise had none over the weekend.  She is tolerating fluid intake without difficulty.  Discussed the importance of staying hydrated and advised patient to call the cancer center if she needs to come in for fluids later this week. Strict ED precautions discussed should symptoms worsen.

## 2021-08-18 NOTE — Progress Notes (Signed)
Patient Care Team: [name], MD as PCP - General (Family Medicine) Janina Mayo, MD as PCP - Cardiology (Cardiology) Nicholas Lose, MD as Consulting Physician (Hematology and Oncology) Servando Salina, MD as Consulting Physician (Obstetrics and Gynecology) Irene Limbo, MD as Consulting Physician (Plastic Surgery) Rolm Bookbinder, MD as Consulting Physician (General Surgery)  DIAGNOSIS:  Encounter Diagnoses  Name Primary?   Malignant neoplasm of upper-outer quadrant of right breast in female, estrogen receptor positive ([place])    Iron deficiency anemia, unspecified iron deficiency anemia type Yes    SUMMARY OF ONCOLOGIC HISTORY: Oncology History  Malignant neoplasm of upper-outer quadrant of right breast in female, estrogen receptor positive ([place])  12/08/2020 Initial Diagnosis   21-week pregnancy: MRI at [place] noncontrast revealed right axillary mass 3.7 cm, subpectoral lymph nodes, left supraclavicular lymph node, multiple level 2 and 3 lymph nodes in the right axilla, 2 breast masses 1.1 cm largest size.  Mammogram in [place] revealed 3 small masses at 12:00 1.2 cm, 0.6 cm, 0.9 cm and 5 abnormal axillary lymph nodes, pleomorphic calcifications measuring 10 cm, biopsy revealed grade 2 IDC with necrosis and calcifications ER 30%, PR 0%, HER2 positive, Ki-67 30 to 40%   12/15/2020 Cancer Staging   Staging form: Breast, AJCC 8th Edition - Clinical: Stage IV (cT3, cN2, pM1, G2, ER+, PR-, HER2+) - Signed by Nicholas Lose, MD on 12/15/2020 Histologic grading system: 3 grade system   12/24/2020 - 02/23/2021 Chemotherapy   Patient is on Treatment Plan : BREAST Adjuvant AC q21d     12/29/2020 Genetic Testing   Negative hereditary cancer genetic testing: no pathogenic variants detected in [facility] BRCAPlus Panel or [employer] CustomNext-Cancer +RNAinsight Panel.  The report dates are 12/23/2020 and 12/29/2020.   The BRCAplus panel offered by [employer] and includes sequencing and  deletion/duplication analysis for the following 8 genes: ATM, BRCA1, BRCA2, CDH1, CHEK2, PALB2, PTEN, and TP53.  The CustomNext-Cancer+RNAinsight panel offered by [employer] [facility] includes sequencing and rearrangement analysis for the following 47 genes:  APC, ATM, AXIN2, BARD1, BMPR1A, BRCA1, BRCA2, BRIP1, CDH1, CDK4, CDKN2A, CHEK2, DICER1, EPCAM, GREM1, HOXB13, MEN1, MLH1, MSH2, MSH3, MSH6, MUTYH, NBN, NF1, NF2, NTHL1, PALB2, PMS2, POLD1, POLE, PTEN, RAD51C, RAD51D, RECQL, RET, SDHA, SDHAF2, SDHB, SDHC, SDHD, SMAD4, SMARCA4, STK11, TP53, TSC1, TSC2, and VHL.  RNA data is routinely analyzed for use in variant interpretation for all genes.   03/25/2021 - 06/30/2021 Chemotherapy   Patient is on Treatment Plan : BREAST AC q21 days / Trastuzumab + Pertuzumab + Weekly Paclitaxel q21d     07/29/2021 -  Chemotherapy   Patient is on Treatment Plan : BREAST ADO-Trastuzumab Emtansine (Kadcyla) q21d       CHIEF COMPLIANT: Follow-up Kadcyla  INTERVAL HISTORY: Evelyn Marshall is a 36 year old above-mentioned history of breast cancer who is HER2 positive. She presents to the clinic today for a follow-up. She states that the pockets feels kind of fluffy. She has gotten her drained removed. She had some nausea and low appetite. It was only the first few days but everything is better now. She does see a kidney docter but he states everyting was fine.   ALLERGIES:  has No Known Allergies.  MEDICATIONS:  Current Outpatient Medications  Medication Sig Dispense Refill   carvedilol (COREG) 25 MG tablet Take 1 tablet (25 mg total) by mouth 2 (two) times daily. 180 tablet 3   Iron-FA-B Cmp-C-Biot-Probiotic (FUSION PLUS) CAPS Take 1 capsule by mouth daily.     lidocaine-prilocaine (EMLA) cream Apply 1  application. topically as needed (apply once weekly prior to port a cath needle insertion.). 30 g 0   losartan (COZAAR) 25 MG tablet Take 2 tablets (50 mg total) by mouth daily. 180 tablet 1   No current facility-administered  medications for this visit.    PHYSICAL EXAMINATION: ECOG PERFORMANCE STATUS: 1 - Symptomatic but completely ambulatory  Vitals:   08/19/21 1107  BP: 135/70  Pulse: 81  Resp: 18  Temp: 97.8 F (36.6 C)  SpO2: 100%   Filed Weights   08/19/21 1107  Weight: 265 lb 11.2 oz (120.5 kg)      LABORATORY DATA:  I have reviewed the data as listed    Latest Ref Rng & Units 08/12/2021    9:52 AM 07/29/2021   11:01 AM 06/30/2021    9:24 AM  CMP  Glucose 70 - 99 mg/dL 102  89  101   BUN 6 - 20 mg/dL '17  12  12   ' Creatinine 0.44 - 1.00 mg/dL 1.26  0.73  0.88   Sodium 135 - 145 mmol/L 136  136  139   Potassium 3.5 - 5.1 mmol/L 3.6  4.3  3.9   Chloride 98 - 111 mmol/L 99  103  104   CO2 22 - 32 mmol/L '27  27  27   ' Calcium 8.9 - 10.3 mg/dL 9.2  9.2  10.0   Total Protein 6.5 - 8.1 g/dL 8.2  7.3  7.6   Total Bilirubin 0.3 - 1.2 mg/dL 0.4  0.3  0.4   Alkaline Phos 38 - 126 U/L 136  151  107   AST 15 - 41 U/L 59  22  30   ALT 0 - 44 U/L 65  30  45     Lab Results  Component Value Date   WBC 4.6 08/19/2021   HGB 10.9 (L) 08/19/2021   HCT 33.3 (L) 08/19/2021   MCV 78.7 (L) 08/19/2021   PLT 392 08/19/2021   NEUTROABS 2.8 08/19/2021    ASSESSMENT & PLAN:  Malignant neoplasm of upper-outer quadrant of right breast in female, estrogen receptor positive ([place]) 12/08/2020:21-week pregnancy: MRI at [place] noncontrast revealed right axillary mass 3.7 cm, subpectoral lymph nodes, left supraclavicular lymph node, multiple level 2 and 3 lymph nodes in the right axilla, 2 breast masses 1.1 cm largest size.  Mammogram in [place] revealed 3 small masses at 12:00 1.2 cm, 0.6 cm, 0.9 cm and 5 abnormal axillary lymph nodes, pleomorphic calcifications measuring 10 cm, biopsy revealed grade 2 IDC with necrosis and calcifications ER 30%, PR 0%, HER2 positive, Ki-67 30 to 40%   PET/CT 03/24/2021: Multifocal right breast cancer, hypermetabolic right axillary, subpectoral, bilateral supraclavicular and  mediastinal lymph nodes   Treatment plan: 1.  Neoadjuvant chemotherapy with Adriamycin and Cytoxan every 3 weeks x4 started 12/24/2020 completed 02/23/2021 2. delivery of the baby 3.  Continue neoadjuvant therapy with Taxol Herceptin and Perjeta (Taxol completed 06/09/21) 4.  Mastectomy with ALND:  07/05/21: 8 mm residual HG IDC with LVI, Margins Neg 4.  Kadcyla started 07/29/2021 5.  Adjuvant radiation therapy (radiation oncology is not certain of benefits of radiation given PET/CT findings) 6.  Adjuvant antiestrogen therapy with ovarian function suppression Genetic testing ---------------------------------------------------------------------------------------------------------------------------------- Current treatment: Kadcyla started 07/29/2021, today is cycle 2  Kadcyla toxicities: 1.  Severe nausea, vomiting and diarrhea: Rotavirus infection improved with supportive care  Monitoring closely for toxicities Return to clinic every 3 weeks for Kadcyla  08/09/2021: PET CT scan: Right mastectomy and  right axillary lymph node dissection: Postoperative changes and seroma.  No convincing evidence of distant metastatic disease.  Radiation will be starting soon. Return to clinic every 3 weeks for Kadcyla treatment.    Orders Placed This Encounter  Procedures   Iron and Iron Binding Capacity ([facility] only)    Standing Status:   Future    Standing Expiration Date:   08/20/2022   Ferritin    Standing Status:   Future    Standing Expiration Date:   08/19/2022   The patient has a good understanding of the overall plan. she agrees with it. she will call with any problems that may develop before the next visit here. Total time spent: 30 mins including face to face time and time spent for planning, charting and co-ordination of care   Harriette Ohara, MD 08/19/21    I Gardiner Coins am scribing for Dr. Lindi Adie  I have reviewed the above documentation for accuracy and completeness, and I agree  with the above.

## 2021-08-19 ENCOUNTER — Inpatient Hospital Stay: Payer: BC Managed Care – PPO

## 2021-08-19 ENCOUNTER — Inpatient Hospital Stay (HOSPITAL_BASED_OUTPATIENT_CLINIC_OR_DEPARTMENT_OTHER): Payer: BC Managed Care – PPO | Admitting: Hematology and Oncology

## 2021-08-19 ENCOUNTER — Other Ambulatory Visit: Payer: Self-pay

## 2021-08-19 VITALS — BP 135/70 | HR 81 | Temp 97.8°F | Resp 18 | Ht 64.0 in | Wt 265.7 lb

## 2021-08-19 DIAGNOSIS — Z170 Estrogen receptor positive status [ER+]: Secondary | ICD-10-CM

## 2021-08-19 DIAGNOSIS — Z95828 Presence of other vascular implants and grafts: Secondary | ICD-10-CM

## 2021-08-19 DIAGNOSIS — D509 Iron deficiency anemia, unspecified: Secondary | ICD-10-CM | POA: Diagnosis not present

## 2021-08-19 DIAGNOSIS — C50411 Malignant neoplasm of upper-outer quadrant of right female breast: Secondary | ICD-10-CM

## 2021-08-19 DIAGNOSIS — Z5112 Encounter for antineoplastic immunotherapy: Secondary | ICD-10-CM | POA: Diagnosis not present

## 2021-08-19 LAB — CBC WITH DIFFERENTIAL (CANCER CENTER ONLY)
Abs Immature Granulocytes: 0.01 10*3/uL (ref 0.00–0.07)
Basophils Absolute: 0.1 10*3/uL (ref 0.0–0.1)
Basophils Relative: 2 %
Eosinophils Absolute: 0.1 10*3/uL (ref 0.0–0.5)
Eosinophils Relative: 2 %
HCT: 33.3 % — ABNORMAL LOW (ref 36.0–46.0)
Hemoglobin: 10.9 g/dL — ABNORMAL LOW (ref 12.0–15.0)
Immature Granulocytes: 0 %
Lymphocytes Relative: 26 %
Lymphs Abs: 1.2 10*3/uL (ref 0.7–4.0)
MCH: 25.8 pg — ABNORMAL LOW (ref 26.0–34.0)
MCHC: 32.7 g/dL (ref 30.0–36.0)
MCV: 78.7 fL — ABNORMAL LOW (ref 80.0–100.0)
Monocytes Absolute: 0.5 10*3/uL (ref 0.1–1.0)
Monocytes Relative: 10 %
Neutro Abs: 2.8 10*3/uL (ref 1.7–7.7)
Neutrophils Relative %: 60 %
Platelet Count: 392 10*3/uL (ref 150–400)
RBC: 4.23 MIL/uL (ref 3.87–5.11)
RDW: 15.4 % (ref 11.5–15.5)
WBC Count: 4.6 10*3/uL (ref 4.0–10.5)
nRBC: 0 % (ref 0.0–0.2)

## 2021-08-19 LAB — CMP (CANCER CENTER ONLY)
ALT: 39 U/L (ref 0–44)
AST: 34 U/L (ref 15–41)
Albumin: 3.9 g/dL (ref 3.5–5.0)
Alkaline Phosphatase: 117 U/L (ref 38–126)
Anion gap: 8 (ref 5–15)
BUN: 8 mg/dL (ref 6–20)
CO2: 27 mmol/L (ref 22–32)
Calcium: 9.2 mg/dL (ref 8.9–10.3)
Chloride: 106 mmol/L (ref 98–111)
Creatinine: 0.76 mg/dL (ref 0.44–1.00)
GFR, Estimated: >60 mL/min (ref >60)
Glucose, Bld: 98 mg/dL (ref 70–99)
Potassium: 3.6 mmol/L (ref 3.5–5.1)
Sodium: 141 mmol/L (ref 135–145)
Total Bilirubin: 0.4 mg/dL (ref 0.3–1.2)
Total Protein: 7.6 g/dL (ref 6.5–8.1)

## 2021-08-19 MED ORDER — SODIUM CHLORIDE 0.9% FLUSH
10.0000 mL | Freq: Once | INTRAVENOUS | Status: AC
  Administered 2021-08-19: 10 mL

## 2021-08-19 MED ORDER — ACETAMINOPHEN 325 MG PO TABS
650.0000 mg | ORAL_TABLET | Freq: Once | ORAL | Status: AC
  Administered 2021-08-19: 650 mg via ORAL
  Filled 2021-08-19: qty 2

## 2021-08-19 MED ORDER — HEPARIN SOD (PORK) LOCK FLUSH 100 UNIT/ML IV SOLN
500.0000 [IU] | Freq: Once | INTRAVENOUS | Status: AC | PRN
  Administered 2021-08-19: 500 [IU]

## 2021-08-19 MED ORDER — SODIUM CHLORIDE 0.9 % IV SOLN: Freq: Once | INTRAVENOUS | Status: AC

## 2021-08-19 MED ORDER — SODIUM CHLORIDE 0.9 % IV SOLN
3.6000 mg/kg | Freq: Once | INTRAVENOUS | Status: AC
  Administered 2021-08-19: 460 mg via INTRAVENOUS
  Filled 2021-08-19: qty 23

## 2021-08-19 MED ORDER — SODIUM CHLORIDE 0.9% FLUSH
10.0000 mL | INTRAVENOUS | Status: DC | PRN
  Administered 2021-08-19: 10 mL

## 2021-08-19 MED ORDER — DIPHENHYDRAMINE HCL 25 MG PO CAPS
50.0000 mg | ORAL_CAPSULE | Freq: Once | ORAL | Status: AC
  Administered 2021-08-19: 50 mg via ORAL
  Filled 2021-08-19: qty 2

## 2021-08-19 NOTE — Patient Instructions (Addendum)
[facility] ONCOLOGY  Discharge Instructions: Thank you for choosing [facility] to provide your oncology and hematology care.   If you have a lab appointment with the [facility], please go directly to the [facility] and check in at the registration area.   Wear comfortable clothing and clothing appropriate for easy access to any Portacath or PICC line.   We strive to give you quality time with your provider. You may need to reschedule your appointment if you arrive late (15 or more minutes).  Arriving late affects you and other patients whose appointments are after yours.  Also, if you miss three or more appointments without notifying the office, you may be dismissed from the clinic at the provider's discretion.      For prescription refill requests, have your pharmacy contact our office and allow 72 hours for refills to be completed.    Today you received the following chemotherapy and/or immunotherapy agents: ado-trastuzumab emtansine (Kadcyla)      To help prevent nausea and vomiting after your treatment, we encourage you to take your nausea medication as directed.  BELOW ARE SYMPTOMS THAT SHOULD BE REPORTED IMMEDIATELY: *FEVER GREATER THAN 100.4 F (38 C) OR HIGHER *CHILLS OR SWEATING *NAUSEA AND VOMITING THAT IS NOT CONTROLLED WITH YOUR NAUSEA MEDICATION *UNUSUAL SHORTNESS OF BREATH *UNUSUAL BRUISING OR BLEEDING *URINARY PROBLEMS (pain or burning when urinating, or frequent urination) *BOWEL PROBLEMS (unusual diarrhea, constipation, pain near the anus) TENDERNESS IN MOUTH AND THROAT WITH OR WITHOUT PRESENCE OF ULCERS (sore throat, sores in mouth, or a toothache) UNUSUAL RASH, SWELLING OR PAIN  UNUSUAL VAGINAL DISCHARGE OR ITCHING   Items with * indicate a potential emergency and should be followed up as soon as possible or go to the Emergency Department if any problems should occur.  Please show the CHEMOTHERAPY ALERT CARD or IMMUNOTHERAPY  ALERT CARD at check-in to the Emergency Department and triage nurse.  Should you have questions after your visit or need to cancel or reschedule your appointment, please contact [facility]  Dept: [phone]  and follow the prompts.  Office hours are 8:00 a.m. to 4:30 p.m. Monday - Friday. Please note that voicemails left after 4:00 p.m. may not be returned until the following business day.  We are closed weekends and major holidays. You have access to a nurse at all times for urgent questions. Please call the main number to the clinic Dept: [phone] and follow the prompts.   For any non-urgent questions, you may also contact your provider using MyChart. We now offer e-Visits for anyone 27 and older to request care online for non-urgent symptoms. For details visit [email or web address].   Also download the MyChart app! Go to the app store, search "MyChart", open the app, select [facility], and log in with your MyChart username and password.  Masks are optional in the cancer centers. If you would like for your care team to wear a mask while they are taking care of you, please let them know. For doctor visits, patients may have with them one support person who is at least 34 years old. At this time, visitors are not allowed in the infusion area.   Ado-Trastuzumab Emtansine for injection What is this medication? ADO-TRASTUZUMAB EMTANSINE (ADD oh traz TOO zuh mab em TAN zine) is a monoclonal antibody combined with chemotherapy. It is used to treat breast cancer. This medicine may be used for other purposes; ask your health care provider or  pharmacist if you have questions. COMMON BRAND NAME(S): Kadcyla What should I tell my care team before I take this medication? They need to know if you have any of these conditions: heart disease heart failure infection (especially a virus infection such as chickenpox, cold sores, or herpes) liver disease lung or breathing  disease, like asthma tingling of the fingers or toes, or other nerve disorder an unusual or allergic reaction to ado-trastuzumab emtansine, other medications, foods, dyes, or preservatives pregnant or trying to get pregnant breast-feeding How should I use this medication? This medicine is for infusion into a vein. It is given by a health care professional in a hospital or clinic setting. Talk to your pediatrician regarding the use of this medicine in children. Special care may be needed. Overdosage: If you think you have taken too much of this medicine contact a poison control center or emergency room at once. NOTE: This medicine is only for you. Do not share this medicine with others. What if I miss a dose? It is important not to miss your dose. Call your doctor or health care professional if you are unable to keep an appointment. What may interact with this medication? This medicine may also interact with the following medications: atazanavir boceprevir clarithromycin delavirdine indinavir dalfopristin; quinupristin isoniazid, INH itraconazole ketoconazole nefazodone nelfinavir ritonavir telaprevir telithromycin tipranavir voriconazole This list may not describe all possible interactions. Give your health care provider a list of all the medicines, herbs, non-prescription drugs, or dietary supplements you use. Also tell them if you smoke, drink alcohol, or use illegal drugs. Some items may interact with your medicine. What should I watch for while using this medication? Visit your doctor for checks on your progress. This drug may make you feel generally unwell. This is not uncommon, as chemotherapy can affect healthy cells as well as cancer cells. Report any side effects. Continue your course of treatment even though you feel ill unless your doctor tells you to stop. You may need blood work done while you are taking this medicine. Call your doctor or health care professional for  advice if you get a fever, chills or sore throat, or other symptoms of a cold or flu. Do not treat yourself. This drug decreases your body's ability to fight infections. Try to avoid being around people who are sick. Be careful brushing and flossing your teeth or using a toothpick because you may get an infection or bleed more easily. If you have any dental work done, tell your dentist you are receiving this medicine. Avoid taking products that contain aspirin, acetaminophen, ibuprofen, naproxen, or ketoprofen unless instructed by your doctor. These medicines may hide a fever. Do not become pregnant while taking this medicine or for 7 months after stopping it, men with female partners should use contraception during treatment and for 4 months after the last dose. Women should inform their doctor if they wish to become pregnant or think they might be pregnant. There is a potential for serious side effects to an unborn child. Do not breast-feed an infant while taking this medicine or for 7 months after the last dose. Men who have a partner who is pregnant or who is capable of becoming pregnant should use a condom during sexual activity while taking this medicine and for 4 months after stopping it. Men should inform their doctors if they wish to father a child. This medicine may lower sperm counts. Talk to your health care professional or pharmacist for more information. What side effects  may I notice from receiving this medication? Side effects that you should report to your doctor or health care professional as soon as possible: allergic reactions like skin rash, itching or hives, swelling of the face, lips, or tongue breathing problems chest pain or palpitations fever or chills, sore throat general ill feeling or flu-like symptoms light-colored stools nausea, vomiting pain, tingling, numbness in the hands or feet signs and symptoms of bleeding such as bloody or [place], tarry stools; red or dark-brown  urine; spitting up blood or brown material that looks like coffee grounds; red spots on the skin; unusual bruising or bleeding from the eye, gums, or nose swelling of the legs or ankles yellowing of the eyes or skin Side effects that usually do not require medical attention (report to your doctor or health care professional if they continue or are bothersome): changes in taste constipation dizziness headache joint pain muscle pain trouble sleeping unusually weak or tired This list may not describe all possible side effects. Call your doctor for medical advice about side effects. You may report side effects to FDA at 1-800-FDA-1088. Where should I keep my medication? This drug is given in a hospital or clinic and will not be stored at home. NOTE: This sheet is a summary. It may not cover all possible information. If you have questions about this medicine, talk to your doctor, pharmacist, or health care provider.  2023 Elsevier/Gold Standard (2018-01-29 00:00:00)

## 2021-08-19 NOTE — Assessment & Plan Note (Addendum)
12/08/2020:21-week pregnancy: MRI at [facility] noncontrast revealed right axillary mass 3.7 cm, subpectoral lymph nodes, left supraclavicular lymph node, multiple level 2 and 3 lymph nodes in the right axilla, 2 breast masses 1.1 cm largest size. 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 Neoadjuvant chemotherapy with Adriamycin and Cytoxan every 3 weeks x4started 12/24/2020 completed 02/23/2021 2. delivery of the baby 3. Continue neoadjuvant therapy with Taxol Herceptin and Perjeta (Taxol completed 06/09/21) 4.  Mastectomy with ALND:  07/05/21: 8 mm residual HG IDC with LVI, Margins Neg 4. Kadcyla started 07/29/2021 5. Adjuvant radiation therapy(radiation oncology is not certain of benefits of radiation given PET/CT findings) 6. Adjuvant antiestrogen therapy with ovarian function suppression Genetic testing ---------------------------------------------------------------------------------------------------------------------------------- Current treatment: Kadcyla started 07/29/2021, today is cycle 2  Kadcyla toxicities: 1.  Severe nausea, vomiting and diarrhea: Rotavirus infection improved with supportive care  Monitoring closely for toxicities Return to clinic every 3 weeks for Kadcyla  08/09/2021: PET CT scan: Right mastectomy and right axillary lymph node dissection: Postoperative changes and seroma.  No convincing evidence of distant metastatic disease.  Radiation will be starting soon. Return to clinic every 3 weeks for Kadcyla treatment.

## 2021-08-22 ENCOUNTER — Other Ambulatory Visit: Payer: Self-pay

## 2021-08-22 ENCOUNTER — Ambulatory Visit: Payer: BC Managed Care – PPO

## 2021-08-22 ENCOUNTER — Ambulatory Visit
Admission: RE | Admit: 2021-08-22 | Discharge: 2021-08-22 | Disposition: A | Discharge status: Home / Self Care | Payer: BC Managed Care – PPO | Source: Ambulatory Visit | Attending: Radiation Oncology | Admitting: Radiation Oncology

## 2021-08-22 DIAGNOSIS — Z170 Estrogen receptor positive status [ER+]: Secondary | ICD-10-CM

## 2021-08-22 DIAGNOSIS — Z5112 Encounter for antineoplastic immunotherapy: Secondary | ICD-10-CM | POA: Diagnosis not present

## 2021-08-22 LAB — RAD ONC ARIA SESSION SUMMARY
Course Elapsed Days: 0
Plan Fractions Treated to Date: 1
Plan Fractions Treated to Date: 1
Plan Fractions Treated to Date: 1
Plan Prescribed Dose Per Fraction: 2 Gy
Plan Prescribed Dose Per Fraction: 2 Gy
Plan Prescribed Dose Per Fraction: 2 Gy
Plan Total Fractions Prescribed: 13
Plan Total Fractions Prescribed: 25
Plan Total Fractions Prescribed: 25
Plan Total Prescribed Dose: 26 Gy
Plan Total Prescribed Dose: 50 Gy
Plan Total Prescribed Dose: 50 Gy
Reference Point Dosage Given to Date: 2 Gy
Reference Point Dosage Given to Date: 2 Gy
Reference Point Dosage Given to Date: 2 Gy
Reference Point Session Dosage Given: 2 Gy
Reference Point Session Dosage Given: 2 Gy
Reference Point Session Dosage Given: 2 Gy
Session Number: 1

## 2021-08-22 MED ORDER — RADIAPLEXRX EX GEL: Freq: Once | CUTANEOUS | Status: AC

## 2021-08-22 MED ORDER — ALRA NON-METALLIC DEODORANT (RAD-ONC)
1.0000 | Freq: Once | TOPICAL | Status: AC
  Administered 2021-08-22: 1 via TOPICAL

## 2021-08-22 NOTE — Progress Notes (Signed)
Pt here for patient teaching.    Pt given Radiation and You booklet, skin care instructions, Alra deodorant, and Radiaplex gel.    Reviewed areas of pertinence such as fatigue, hair loss in treatment field, skin changes, breast tenderness, and breast swelling .   Pt able to give teach back of to pat skin, use unscented/gentle soap, and drink plenty of water,apply Radiaplex bid, avoid applying anything to skin within 4 hours of treatment, avoid wearing an under wire bra, and to use an electric razor if they must shave.   Pt verbalizes understanding of information given and will contact nursing with any questions or concerns.    [email or web address]

## 2021-08-23 ENCOUNTER — Other Ambulatory Visit: Payer: Self-pay

## 2021-08-23 ENCOUNTER — Ambulatory Visit
Admission: RE | Admit: 2021-08-23 | Discharge: 2021-08-23 | Disposition: A | Discharge status: Home / Self Care | Payer: BC Managed Care – PPO | Source: Ambulatory Visit | Attending: Radiation Oncology | Admitting: Radiation Oncology

## 2021-08-23 DIAGNOSIS — Z170 Estrogen receptor positive status [ER+]: Secondary | ICD-10-CM | POA: Insufficient documentation

## 2021-08-23 DIAGNOSIS — C50411 Malignant neoplasm of upper-outer quadrant of right female breast: Secondary | ICD-10-CM | POA: Diagnosis present

## 2021-08-23 LAB — RAD ONC ARIA SESSION SUMMARY
Course Elapsed Days: 1
Plan Fractions Treated to Date: 1
Plan Fractions Treated to Date: 2
Plan Fractions Treated to Date: 2
Plan Prescribed Dose Per Fraction: 2 Gy
Plan Prescribed Dose Per Fraction: 2 Gy
Plan Prescribed Dose Per Fraction: 2 Gy
Plan Total Fractions Prescribed: 12
Plan Total Fractions Prescribed: 25
Plan Total Fractions Prescribed: 25
Plan Total Prescribed Dose: 24 Gy
Plan Total Prescribed Dose: 50 Gy
Plan Total Prescribed Dose: 50 Gy
Reference Point Dosage Given to Date: 4 Gy
Reference Point Dosage Given to Date: 4 Gy
Reference Point Dosage Given to Date: 4 Gy
Reference Point Session Dosage Given: 2 Gy
Reference Point Session Dosage Given: 2 Gy
Reference Point Session Dosage Given: 2 Gy
Session Number: 2

## 2021-08-24 ENCOUNTER — Ambulatory Visit
Admission: RE | Admit: 2021-08-24 | Discharge: 2021-08-24 | Disposition: A | Discharge status: Home / Self Care | Payer: BC Managed Care – PPO | Source: Ambulatory Visit | Attending: Radiation Oncology | Admitting: Radiation Oncology

## 2021-08-24 ENCOUNTER — Other Ambulatory Visit: Payer: Self-pay

## 2021-08-24 DIAGNOSIS — C50411 Malignant neoplasm of upper-outer quadrant of right female breast: Secondary | ICD-10-CM | POA: Diagnosis not present

## 2021-08-24 LAB — RAD ONC ARIA SESSION SUMMARY
Course Elapsed Days: 2
Plan Fractions Treated to Date: 2
Plan Fractions Treated to Date: 3
Plan Fractions Treated to Date: 3
Plan Prescribed Dose Per Fraction: 2 Gy
Plan Prescribed Dose Per Fraction: 2 Gy
Plan Prescribed Dose Per Fraction: 2 Gy
Plan Total Fractions Prescribed: 13
Plan Total Fractions Prescribed: 25
Plan Total Fractions Prescribed: 25
Plan Total Prescribed Dose: 26 Gy
Plan Total Prescribed Dose: 50 Gy
Plan Total Prescribed Dose: 50 Gy
Reference Point Dosage Given to Date: 6 Gy
Reference Point Dosage Given to Date: 6 Gy
Reference Point Dosage Given to Date: 6 Gy
Reference Point Session Dosage Given: 2 Gy
Reference Point Session Dosage Given: 2 Gy
Reference Point Session Dosage Given: 2 Gy
Session Number: 3

## 2021-08-25 ENCOUNTER — Other Ambulatory Visit: Payer: Self-pay

## 2021-08-25 ENCOUNTER — Ambulatory Visit
Admission: RE | Admit: 2021-08-25 | Discharge: 2021-08-25 | Disposition: A | Discharge status: Home / Self Care | Payer: BC Managed Care – PPO | Source: Ambulatory Visit | Attending: Radiation Oncology | Admitting: Radiation Oncology

## 2021-08-25 DIAGNOSIS — C50411 Malignant neoplasm of upper-outer quadrant of right female breast: Secondary | ICD-10-CM | POA: Diagnosis not present

## 2021-08-25 LAB — RAD ONC ARIA SESSION SUMMARY
Course Elapsed Days: 3
Plan Fractions Treated to Date: 2
Plan Fractions Treated to Date: 4
Plan Fractions Treated to Date: 4
Plan Prescribed Dose Per Fraction: 2 Gy
Plan Prescribed Dose Per Fraction: 2 Gy
Plan Prescribed Dose Per Fraction: 2 Gy
Plan Total Fractions Prescribed: 12
Plan Total Fractions Prescribed: 25
Plan Total Fractions Prescribed: 25
Plan Total Prescribed Dose: 24 Gy
Plan Total Prescribed Dose: 50 Gy
Plan Total Prescribed Dose: 50 Gy
Reference Point Dosage Given to Date: 8 Gy
Reference Point Dosage Given to Date: 8 Gy
Reference Point Dosage Given to Date: 8 Gy
Reference Point Session Dosage Given: 2 Gy
Reference Point Session Dosage Given: 2 Gy
Reference Point Session Dosage Given: 2 Gy
Session Number: 4

## 2021-08-26 ENCOUNTER — Ambulatory Visit
Admission: RE | Admit: 2021-08-26 | Discharge: 2021-08-26 | Disposition: A | Discharge status: Home / Self Care | Payer: BC Managed Care – PPO | Source: Ambulatory Visit | Attending: Radiation Oncology | Admitting: Radiation Oncology

## 2021-08-26 ENCOUNTER — Other Ambulatory Visit: Payer: Self-pay

## 2021-08-26 DIAGNOSIS — C50411 Malignant neoplasm of upper-outer quadrant of right female breast: Secondary | ICD-10-CM | POA: Diagnosis not present

## 2021-08-26 LAB — RAD ONC ARIA SESSION SUMMARY
Course Elapsed Days: 4
Plan Fractions Treated to Date: 3
Plan Fractions Treated to Date: 5
Plan Fractions Treated to Date: 5
Plan Prescribed Dose Per Fraction: 2 Gy
Plan Prescribed Dose Per Fraction: 2 Gy
Plan Prescribed Dose Per Fraction: 2 Gy
Plan Total Fractions Prescribed: 13
Plan Total Fractions Prescribed: 25
Plan Total Fractions Prescribed: 25
Plan Total Prescribed Dose: 26 Gy
Plan Total Prescribed Dose: 50 Gy
Plan Total Prescribed Dose: 50 Gy
Reference Point Dosage Given to Date: 10 Gy
Reference Point Dosage Given to Date: 10 Gy
Reference Point Dosage Given to Date: 10 Gy
Reference Point Session Dosage Given: 2 Gy
Reference Point Session Dosage Given: 2 Gy
Reference Point Session Dosage Given: 2 Gy
Session Number: 5

## 2021-08-29 ENCOUNTER — Other Ambulatory Visit: Payer: Self-pay

## 2021-08-29 ENCOUNTER — Ambulatory Visit
Admission: RE | Admit: 2021-08-29 | Discharge: 2021-08-29 | Disposition: A | Discharge status: Home / Self Care | Payer: BC Managed Care – PPO | Source: Ambulatory Visit | Attending: Radiation Oncology | Admitting: Radiation Oncology

## 2021-08-29 DIAGNOSIS — C50411 Malignant neoplasm of upper-outer quadrant of right female breast: Secondary | ICD-10-CM | POA: Diagnosis not present

## 2021-08-29 LAB — RAD ONC ARIA SESSION SUMMARY
Course Elapsed Days: 7
Plan Fractions Treated to Date: 3
Plan Fractions Treated to Date: 6
Plan Fractions Treated to Date: 6
Plan Prescribed Dose Per Fraction: 2 Gy
Plan Prescribed Dose Per Fraction: 2 Gy
Plan Prescribed Dose Per Fraction: 2 Gy
Plan Total Fractions Prescribed: 12
Plan Total Fractions Prescribed: 25
Plan Total Fractions Prescribed: 25
Plan Total Prescribed Dose: 24 Gy
Plan Total Prescribed Dose: 50 Gy
Plan Total Prescribed Dose: 50 Gy
Reference Point Dosage Given to Date: 12 Gy
Reference Point Dosage Given to Date: 12 Gy
Reference Point Dosage Given to Date: 12 Gy
Reference Point Session Dosage Given: 2 Gy
Reference Point Session Dosage Given: 2 Gy
Reference Point Session Dosage Given: 2 Gy
Session Number: 6

## 2021-08-30 ENCOUNTER — Other Ambulatory Visit: Payer: Self-pay

## 2021-08-30 ENCOUNTER — Ambulatory Visit
Admission: RE | Admit: 2021-08-30 | Discharge: 2021-08-30 | Disposition: A | Discharge status: Home / Self Care | Payer: BC Managed Care – PPO | Source: Ambulatory Visit | Attending: Radiation Oncology | Admitting: Radiation Oncology

## 2021-08-30 ENCOUNTER — Ambulatory Visit: Payer: BC Managed Care – PPO

## 2021-08-30 DIAGNOSIS — C50411 Malignant neoplasm of upper-outer quadrant of right female breast: Secondary | ICD-10-CM | POA: Diagnosis not present

## 2021-08-30 LAB — RAD ONC ARIA SESSION SUMMARY
Course Elapsed Days: 8
Plan Fractions Treated to Date: 4
Plan Fractions Treated to Date: 7
Plan Fractions Treated to Date: 7
Plan Prescribed Dose Per Fraction: 2 Gy
Plan Prescribed Dose Per Fraction: 2 Gy
Plan Prescribed Dose Per Fraction: 2 Gy
Plan Total Fractions Prescribed: 13
Plan Total Fractions Prescribed: 25
Plan Total Fractions Prescribed: 25
Plan Total Prescribed Dose: 26 Gy
Plan Total Prescribed Dose: 50 Gy
Plan Total Prescribed Dose: 50 Gy
Reference Point Dosage Given to Date: 14 Gy
Reference Point Dosage Given to Date: 14 Gy
Reference Point Dosage Given to Date: 14 Gy
Reference Point Session Dosage Given: 2 Gy
Reference Point Session Dosage Given: 2 Gy
Reference Point Session Dosage Given: 2 Gy
Session Number: 7

## 2021-08-31 ENCOUNTER — Telehealth: Payer: Self-pay | Admitting: *Deleted

## 2021-08-31 ENCOUNTER — Ambulatory Visit
Admission: RE | Admit: 2021-08-31 | Discharge: 2021-08-31 | Disposition: A | Discharge status: Home / Self Care | Payer: BC Managed Care – PPO | Source: Ambulatory Visit | Attending: Radiation Oncology | Admitting: Radiation Oncology

## 2021-08-31 ENCOUNTER — Other Ambulatory Visit: Payer: Self-pay

## 2021-08-31 DIAGNOSIS — C50411 Malignant neoplasm of upper-outer quadrant of right female breast: Secondary | ICD-10-CM | POA: Diagnosis not present

## 2021-08-31 LAB — RAD ONC ARIA SESSION SUMMARY
Course Elapsed Days: 9
Plan Fractions Treated to Date: 4
Plan Fractions Treated to Date: 8
Plan Fractions Treated to Date: 8
Plan Prescribed Dose Per Fraction: 2 Gy
Plan Prescribed Dose Per Fraction: 2 Gy
Plan Prescribed Dose Per Fraction: 2 Gy
Plan Total Fractions Prescribed: 12
Plan Total Fractions Prescribed: 25
Plan Total Fractions Prescribed: 25
Plan Total Prescribed Dose: 24 Gy
Plan Total Prescribed Dose: 50 Gy
Plan Total Prescribed Dose: 50 Gy
Reference Point Dosage Given to Date: 16 Gy
Reference Point Dosage Given to Date: 16 Gy
Reference Point Dosage Given to Date: 16 Gy
Reference Point Session Dosage Given: 2 Gy
Reference Point Session Dosage Given: 2 Gy
Reference Point Session Dosage Given: 2 Gy
Session Number: 8

## 2021-08-31 NOTE — Telephone Encounter (Signed)
[place] [employer] and [employer] claim forms completed today by this nurse.  Provider reviewed signed with successful return via fax.  [employer] request included home and treatment medication lists, 08/19/2021 lab results, pathology, and echo reports.   Original to [facility] file for patient pick-up as requested.  Process completes with copy to bin designated for [facility] staff to prepare for scanning office.

## 2021-09-01 ENCOUNTER — Ambulatory Visit
Admission: RE | Admit: 2021-09-01 | Discharge: 2021-09-01 | Disposition: A | Discharge status: Home / Self Care | Payer: BC Managed Care – PPO | Source: Ambulatory Visit | Attending: Radiation Oncology | Admitting: Radiation Oncology

## 2021-09-01 ENCOUNTER — Other Ambulatory Visit: Payer: Self-pay

## 2021-09-01 DIAGNOSIS — C50411 Malignant neoplasm of upper-outer quadrant of right female breast: Secondary | ICD-10-CM | POA: Diagnosis not present

## 2021-09-01 LAB — RAD ONC ARIA SESSION SUMMARY
Course Elapsed Days: 10
Plan Fractions Treated to Date: 5
Plan Fractions Treated to Date: 9
Plan Fractions Treated to Date: 9
Plan Prescribed Dose Per Fraction: 2 Gy
Plan Prescribed Dose Per Fraction: 2 Gy
Plan Prescribed Dose Per Fraction: 2 Gy
Plan Total Fractions Prescribed: 13
Plan Total Fractions Prescribed: 25
Plan Total Fractions Prescribed: 25
Plan Total Prescribed Dose: 26 Gy
Plan Total Prescribed Dose: 50 Gy
Plan Total Prescribed Dose: 50 Gy
Reference Point Dosage Given to Date: 18 Gy
Reference Point Dosage Given to Date: 18 Gy
Reference Point Dosage Given to Date: 18 Gy
Reference Point Session Dosage Given: 2 Gy
Reference Point Session Dosage Given: 2 Gy
Reference Point Session Dosage Given: 2 Gy
Session Number: 9

## 2021-09-02 ENCOUNTER — Ambulatory Visit
Admission: RE | Admit: 2021-09-02 | Discharge: 2021-09-02 | Disposition: A | Discharge status: Home / Self Care | Payer: BC Managed Care – PPO | Source: Ambulatory Visit | Attending: Radiation Oncology | Admitting: Radiation Oncology

## 2021-09-02 ENCOUNTER — Other Ambulatory Visit: Payer: Self-pay

## 2021-09-02 DIAGNOSIS — C50411 Malignant neoplasm of upper-outer quadrant of right female breast: Secondary | ICD-10-CM | POA: Diagnosis not present

## 2021-09-02 LAB — RAD ONC ARIA SESSION SUMMARY
Course Elapsed Days: 11
Plan Fractions Treated to Date: 10
Plan Fractions Treated to Date: 10
Plan Fractions Treated to Date: 5
Plan Prescribed Dose Per Fraction: 2 Gy
Plan Prescribed Dose Per Fraction: 2 Gy
Plan Prescribed Dose Per Fraction: 2 Gy
Plan Total Fractions Prescribed: 12
Plan Total Fractions Prescribed: 25
Plan Total Fractions Prescribed: 25
Plan Total Prescribed Dose: 24 Gy
Plan Total Prescribed Dose: 50 Gy
Plan Total Prescribed Dose: 50 Gy
Reference Point Dosage Given to Date: 20 Gy
Reference Point Dosage Given to Date: 20 Gy
Reference Point Dosage Given to Date: 20 Gy
Reference Point Session Dosage Given: 2 Gy
Reference Point Session Dosage Given: 2 Gy
Reference Point Session Dosage Given: 2 Gy
Session Number: 10

## 2021-09-05 ENCOUNTER — Ambulatory Visit
Admission: RE | Admit: 2021-09-05 | Discharge: 2021-09-05 | Disposition: A | Discharge status: Home / Self Care | Payer: BC Managed Care – PPO | Source: Ambulatory Visit | Attending: Radiation Oncology | Admitting: Radiation Oncology

## 2021-09-05 ENCOUNTER — Other Ambulatory Visit: Payer: Self-pay

## 2021-09-05 ENCOUNTER — Ambulatory Visit: Payer: BC Managed Care – PPO

## 2021-09-05 DIAGNOSIS — Z170 Estrogen receptor positive status [ER+]: Secondary | ICD-10-CM

## 2021-09-05 DIAGNOSIS — C50411 Malignant neoplasm of upper-outer quadrant of right female breast: Secondary | ICD-10-CM | POA: Diagnosis not present

## 2021-09-05 LAB — RAD ONC ARIA SESSION SUMMARY
Course Elapsed Days: 14
Plan Fractions Treated to Date: 11
Plan Fractions Treated to Date: 11
Plan Fractions Treated to Date: 6
Plan Prescribed Dose Per Fraction: 2 Gy
Plan Prescribed Dose Per Fraction: 2 Gy
Plan Prescribed Dose Per Fraction: 2 Gy
Plan Total Fractions Prescribed: 13
Plan Total Fractions Prescribed: 25
Plan Total Fractions Prescribed: 25
Plan Total Prescribed Dose: 26 Gy
Plan Total Prescribed Dose: 50 Gy
Plan Total Prescribed Dose: 50 Gy
Reference Point Dosage Given to Date: 22 Gy
Reference Point Dosage Given to Date: 22 Gy
Reference Point Dosage Given to Date: 22 Gy
Reference Point Session Dosage Given: 2 Gy
Reference Point Session Dosage Given: 2 Gy
Reference Point Session Dosage Given: 2 Gy
Session Number: 11

## 2021-09-05 MED ORDER — RADIAPLEXRX EX GEL: Freq: Once | CUTANEOUS | Status: AC

## 2021-09-06 ENCOUNTER — Other Ambulatory Visit: Payer: Self-pay

## 2021-09-06 ENCOUNTER — Ambulatory Visit
Admission: RE | Admit: 2021-09-06 | Discharge: 2021-09-06 | Disposition: A | Discharge status: Home / Self Care | Payer: BC Managed Care – PPO | Source: Ambulatory Visit | Attending: Radiation Oncology | Admitting: Radiation Oncology

## 2021-09-06 DIAGNOSIS — C50411 Malignant neoplasm of upper-outer quadrant of right female breast: Secondary | ICD-10-CM | POA: Diagnosis not present

## 2021-09-06 LAB — RAD ONC ARIA SESSION SUMMARY
Course Elapsed Days: 15
Plan Fractions Treated to Date: 12
Plan Fractions Treated to Date: 12
Plan Fractions Treated to Date: 6
Plan Prescribed Dose Per Fraction: 2 Gy
Plan Prescribed Dose Per Fraction: 2 Gy
Plan Prescribed Dose Per Fraction: 2 Gy
Plan Total Fractions Prescribed: 12
Plan Total Fractions Prescribed: 25
Plan Total Fractions Prescribed: 25
Plan Total Prescribed Dose: 24 Gy
Plan Total Prescribed Dose: 50 Gy
Plan Total Prescribed Dose: 50 Gy
Reference Point Dosage Given to Date: 24 Gy
Reference Point Dosage Given to Date: 24 Gy
Reference Point Dosage Given to Date: 24 Gy
Reference Point Session Dosage Given: 2 Gy
Reference Point Session Dosage Given: 2 Gy
Reference Point Session Dosage Given: 2 Gy
Session Number: 12

## 2021-09-07 ENCOUNTER — Other Ambulatory Visit: Payer: Self-pay

## 2021-09-07 ENCOUNTER — Ambulatory Visit
Admission: RE | Admit: 2021-09-07 | Discharge: 2021-09-07 | Disposition: A | Discharge status: Home / Self Care | Payer: BC Managed Care – PPO | Source: Ambulatory Visit | Attending: Radiation Oncology | Admitting: Radiation Oncology

## 2021-09-07 DIAGNOSIS — C50411 Malignant neoplasm of upper-outer quadrant of right female breast: Secondary | ICD-10-CM | POA: Diagnosis not present

## 2021-09-07 LAB — RAD ONC ARIA SESSION SUMMARY
Course Elapsed Days: 16
Plan Fractions Treated to Date: 13
Plan Fractions Treated to Date: 13
Plan Fractions Treated to Date: 7
Plan Prescribed Dose Per Fraction: 2 Gy
Plan Prescribed Dose Per Fraction: 2 Gy
Plan Prescribed Dose Per Fraction: 2 Gy
Plan Total Fractions Prescribed: 13
Plan Total Fractions Prescribed: 25
Plan Total Fractions Prescribed: 25
Plan Total Prescribed Dose: 26 Gy
Plan Total Prescribed Dose: 50 Gy
Plan Total Prescribed Dose: 50 Gy
Reference Point Dosage Given to Date: 26 Gy
Reference Point Dosage Given to Date: 26 Gy
Reference Point Dosage Given to Date: 26 Gy
Reference Point Session Dosage Given: 2 Gy
Reference Point Session Dosage Given: 2 Gy
Reference Point Session Dosage Given: 2 Gy
Session Number: 13

## 2021-09-08 ENCOUNTER — Ambulatory Visit: Payer: BC Managed Care – PPO | Admitting: Rehabilitation

## 2021-09-08 ENCOUNTER — Ambulatory Visit
Admission: RE | Admit: 2021-09-08 | Discharge: 2021-09-08 | Disposition: A | Discharge status: Home / Self Care | Payer: BC Managed Care – PPO | Source: Ambulatory Visit | Attending: Radiation Oncology | Admitting: Radiation Oncology

## 2021-09-08 ENCOUNTER — Ambulatory Visit: Payer: BC Managed Care – PPO

## 2021-09-08 ENCOUNTER — Ambulatory Visit: Payer: BC Managed Care – PPO | Attending: General Surgery

## 2021-09-08 ENCOUNTER — Other Ambulatory Visit: Payer: Self-pay

## 2021-09-08 DIAGNOSIS — Z483 Aftercare following surgery for neoplasm: Secondary | ICD-10-CM | POA: Diagnosis present

## 2021-09-08 DIAGNOSIS — C50411 Malignant neoplasm of upper-outer quadrant of right female breast: Secondary | ICD-10-CM | POA: Insufficient documentation

## 2021-09-08 LAB — RAD ONC ARIA SESSION SUMMARY
Course Elapsed Days: 17
Plan Fractions Treated to Date: 14
Plan Fractions Treated to Date: 14
Plan Fractions Treated to Date: 7
Plan Prescribed Dose Per Fraction: 2 Gy
Plan Prescribed Dose Per Fraction: 2 Gy
Plan Prescribed Dose Per Fraction: 2 Gy
Plan Total Fractions Prescribed: 12
Plan Total Fractions Prescribed: 25
Plan Total Fractions Prescribed: 25
Plan Total Prescribed Dose: 24 Gy
Plan Total Prescribed Dose: 50 Gy
Plan Total Prescribed Dose: 50 Gy
Reference Point Dosage Given to Date: 28 Gy
Reference Point Dosage Given to Date: 28 Gy
Reference Point Dosage Given to Date: 28 Gy
Reference Point Session Dosage Given: 2 Gy
Reference Point Session Dosage Given: 2 Gy
Reference Point Session Dosage Given: 2 Gy
Session Number: 14

## 2021-09-08 NOTE — Patient Instructions (Signed)
     [facility]  [address], [address]   [place] [address]  [phone]  After Breast Cancer Class It is recommended you attend the ABC class to be educated on lymphedema risk reduction. This class is free of charge and lasts for 1 hour. It is a 1-time class. You will need to download the Webex app either on your phone or computer. We will send you a link the night before or the morning of the class. You should be able to click on that link to join the class. This is not a confidential class. You don't have to turn your camera on, but other participants may be able to see your email address.  Scar massage You can begin gentle scar massage to you incision sites. Gently place one hand on the incision and move the skin (without sliding on the skin) in various directions. Do this for a few minutes and then you can gently massage either coconut oil or vitamin E cream into the scars.  Compression garment You should continue wearing your compression bra until you feel like you no longer have swelling.  Home exercise Program Continue doing the exercises you were given until you feel like you can do them without feeling any tightness at the end.   Walking Program Studies show that 30 minutes of walking per day (fast enough to elevate your heart rate) can significantly reduce the risk of a cancer recurrence. If you can't walk due to other medical reasons, we encourage you to find another activity you could do (like a stationary bike or water exercise).  Posture After breast cancer surgery, people frequently sit with rounded shoulders posture because it puts their incisions on slack and feels better. If you sit like this and scar tissue forms in that position, you can become very tight and have pain sitting or standing with good posture. Try to be aware of your posture and sit and stand up tall to heal properly.  Follow up PT: It is recommended you return every 3 months for  the first 2 years following surgery to be assessed on the SOZO machine for an L-Dex score. This helps prevent clinically significant lymphedema in 95% of patients. These follow up screens are 10 minute appointments that you are not billed for.  

## 2021-09-08 NOTE — Progress Notes (Signed)
Patient Care Team: [name], MD as PCP - General (Family Medicine) Janina Mayo, MD as PCP - Cardiology (Cardiology) Nicholas Lose, MD as Consulting Physician (Hematology and Oncology) Servando Salina, MD as Consulting Physician (Obstetrics and Gynecology) Irene Limbo, MD as Consulting Physician (Plastic Surgery) Rolm Bookbinder, MD as Consulting Physician (General Surgery)  DIAGNOSIS:  Encounter Diagnosis  Name Primary?   Malignant neoplasm of upper-outer quadrant of right breast in female, estrogen receptor positive ([facility])     SUMMARY OF ONCOLOGIC HISTORY: Oncology History  Malignant neoplasm of upper-outer quadrant of right breast in female, estrogen receptor positive ([place])  12/08/2020 Initial Diagnosis   21-week pregnancy: MRI at [facility] noncontrast revealed right axillary mass 3.7 cm, subpectoral lymph nodes, left supraclavicular lymph node, multiple level 2 and 3 lymph nodes in the right axilla, 2 breast masses 1.1 cm largest size.  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  The report dates are 12/23/2020 and 12/29/2020.   The BRCAplus panel offered by [employer] and includes sequencing and deletion/duplication analysis for the following 8 genes: ATM, BRCA1,  BRCA2, CDH1, CHEK2, PALB2, PTEN, and TP53.  The CustomNext-Cancer+RNAinsight panel offered by [facility] includes sequencing and rearrangement analysis for the following 47 genes:  APC, ATM, AXIN2, BARD1, BMPR1A, BRCA1, BRCA2, BRIP1, CDH1, CDK4, CDKN2A, CHEK2, DICER1, EPCAM, GREM1, HOXB13, MEN1, MLH1, MSH2, MSH3, MSH6, MUTYH, NBN, NF1, NF2, NTHL1, PALB2, PMS2, POLD1, POLE, PTEN, RAD51C, RAD51D, RECQL, RET, SDHA, SDHAF2, SDHB, SDHC, SDHD, SMAD4, SMARCA4, STK11, TP53, TSC1, TSC2, and VHL.  RNA data is routinely analyzed for use in variant interpretation for all genes.   03/25/2021 - 06/30/2021 Chemotherapy   Patient is on Treatment Plan : BREAST AC q21 days / Trastuzumab + Pertuzumab + Weekly Paclitaxel q21d     07/29/2021 -  Chemotherapy   Patient is on Treatment Plan : BREAST ADO-Trastuzumab Emtansine (Kadcyla) q21d       CHIEF COMPLIANT:  Follow-up Kadcyla cycle 2  INTERVAL HISTORY: Evelyn Marshall is a 36 year old above-mentioned history of breast cancer who is HER2 positive and she previously received Taxol Herceptin Perjeta. She presents to the clinic today for a follow-up. She states that the treatment is not bad. She notice the darkness of the skin. She is having fatigue but relieve by rest. Light nausea but it ease up later on in the week. She had her first cycle 08/13/22. Overall she is doing well.  ALLERGIES:  has No Known Allergies.  MEDICATIONS:  Current Outpatient Medications  Medication Sig Dispense Refill   ondansetron (ZOFRAN) 8 MG tablet Take 1 tablet (8 mg total) by mouth every 8 (eight) hours as needed for nausea or vomiting. 20 tablet 1   carvedilol (COREG) 25 MG tablet Take 1 tablet (25 mg total) by mouth 2 (two) times daily.  180 tablet 3   Iron-FA-B Cmp-C-Biot-Probiotic (FUSION PLUS) CAPS Take 1 capsule by mouth daily.     lidocaine-prilocaine (EMLA) cream Apply 1 application. topically as needed (apply once weekly prior to port a cath needle insertion.). 30 g 0   losartan  (COZAAR) 25 MG tablet Take 2 tablets (50 mg total) by mouth daily. 180 tablet 1   No current facility-administered medications for this visit.   Facility-Administered Medications Ordered in Other Visits  Medication Dose Route Frequency Provider Last Rate Last Admin   ado-trastuzumab emtansine (KADCYLA) 460 mg in sodium chloride 0.9 % 250 mL chemo infusion  3.6 mg/kg (Treatment Plan Recorded) Intravenous Once Nicholas Lose, MD       heparin lock flush 100 unit/mL  500 Units Intracatheter Once PRN Nicholas Lose, MD       sodium chloride flush (NS) 0.9 % injection 10 mL  10 mL Intracatheter PRN Nicholas Lose, MD        PHYSICAL EXAMINATION: ECOG PERFORMANCE STATUS: 1 - Symptomatic but completely ambulatory  Vitals:   09/09/21 1027  BP: 113/63  Pulse: 71  Temp: 98.2 F (36.8 C)  SpO2: 100%   Filed Weights   09/09/21 1027  Weight: 262 lb 14.4 oz (119.3 kg)     LABORATORY DATA:  I have reviewed the data as listed    Latest Ref Rng & Units 09/09/2021   10:15 AM 08/19/2021   11:03 AM 08/12/2021    9:52 AM  CMP  Glucose 70 - 99 mg/dL 117  98  102   BUN 6 - 20 mg/dL _0 Creatinine 0.44 - 1.00 mg/dL 0.70  0.76  1.26   Sodium 135 - 145 mmol/L 138  141  136   Potassium 3.5 - 5.1 mmol/L 3.4  3.6  3.6   Chloride 98 - 111 mmol/L 104  106  99   CO2 22 - 32 mmol/L _1 Calcium 8.9 - 10.3 mg/dL 9.4  9.2  9.2   Total Protein 6.5 - 8.1 g/dL 7.4  7.6  8.2   Total Bilirubin 0.3 - 1.2 mg/dL 0.4  0.4  0.4   Alkaline Phos 38 - 126 U/L 101  117  136   AST 15 - 41 U/L 30  34  59   ALT 0 - 44 U/L 25  39  65     Lab Results  Component Value Date   WBC 3.5 (L) 09/09/2021   HGB 11.1 (L) 09/09/2021   HCT 33.8 (L) 09/09/2021   MCV 78.6 (L) 09/09/2021   PLT 278 09/09/2021   NEUTROABS 2.6 09/09/2021    ASSESSMENT & PLAN:  Malignant neoplasm of upper-outer quadrant of right breast in female, estrogen receptor positive ([place]) 12/08/2020:21-week pregnancy: MRI at [facility] noncontrast  revealed right axillary mass 3.7 cm, subpectoral lymph nodes, left supraclavicular lymph node, multiple level 2 and 3 lymph nodes in the right axilla, 2 breast masses 1.1 cm largest size.  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  Neoadjuvant chemotherapy with Adriamycin and Cytoxan every 3 weeks x4 started 12/24/2020 completed 02/23/2021 2.  delivery of the baby 3.  Continue neoadjuvant therapy with Taxol Herceptin and Perjeta (Taxol completed 06/09/21) 4.  Mastectomy with ALND:  07/05/21: 8 mm residual HG IDC with LVI, Margins Neg 4.  Kadcyla started 07/29/2021 5.  Adjuvant radiation therapy (radiation oncology is not certain of benefits of radiation given PET/CT findings) 6.  Adjuvant antiestrogen therapy with ovarian function suppression Genetic testing ---------------------------------------------------------------------------------------------------------------------------------- Current treatment: Kadcyla started 07/29/2021, today is cycle 2   Kadcyla toxicities: 1.  Severe nausea, vomiting and diarrhea: Rotavirus infection improved with supportive care 2. mild fatigue   Monitoring closely for toxicities   08/09/2021: PET CT scan: Right mastectomy and right axillary lymph node dissection: Postoperative changes and seroma.  No convincing evidence of distant metastatic disease.   Radiation: Started 08/23/2021 ending 10/03/2021 Return to clinic every 3 weeks for Kadcyla treatment.    No orders of the defined types were placed in this encounter.  The patient has a good understanding of the overall plan. she  agrees with it. she will call with any problems that may develop before the next visit here. Total time spent: 30 mins including face to face time and time spent for planning, charting and co-ordination of care   Harriette Ohara, MD 09/09/21    I Gardiner Coins am scribing for Dr. Lindi Adie  I have reviewed the above documentation for accuracy and completeness, and I agree with the above.

## 2021-09-08 NOTE — Therapy (Signed)
OUTPATIENT PHYSICAL THERAPY BREAST CANCER POST OP FOLLOW UP   Patient Name: Evelyn Marshall MRN: [id number] DOB:1985-04-18, 36 y.o., female Today's Date: 09/08/2021   PT End of Session - 09/08/21 1257     Visit Number 2    Number of Visits 2    Date for PT Re-Evaluation 09/08/21    PT Start Time 1300    PT Stop Time 5638    PT Time Calculation (min) 45 min    Activity Tolerance Patient tolerated treatment well    Behavior During Therapy [facility] for tasks assessed/performed             Past Medical History:  Diagnosis Date   Abnormal Pap smear 2012   colpo result benign   Cancer ([place])    Chlamydia    Family history of breast cancer 12/15/2020   History of pre-eclampsia    HPV (human papilloma virus) infection    Hyperemesis    Hypokalemia    Pregnancy induced hypertension    Ptyalism    Vaginal Pap smear, abnormal    Past Surgical History:  Procedure Laterality Date   BREAST REDUCTION SURGERY Left 07/05/2021   Procedure: LEFT MAMMARY REDUCTION  (BREAST);  Surgeon: Irene Limbo, MD;  Location: [place];  Service: Plastics;  Laterality: Left;   COLPOSCOPY     IR IMAGING GUIDED PORT INSERTION  12/24/2020   NODE DISSECTION Right 07/05/2021   Procedure: RIGHT AXILLARY NODE DISSECTION;  Surgeon: Rolm Bookbinder, MD;  Location: [place];  Service: General;  Laterality: Right;   SIMPLE MASTECTOMY WITH AXILLARY SENTINEL NODE BIOPSY Right 07/05/2021   Procedure: RIGHT MASTECTOMY;  Surgeon: Rolm Bookbinder, MD;  Location: [place];  Service: General;  Laterality: Right;  [place] EXTRACTION     Patient Active Problem List   Diagnosis Date Noted   S/P mastectomy, right 07/05/2021   Postpartum care following vaginal delivery 03/16/2021   Malignant neoplasm affecting pregnancy in third trimester 03/15/2021   Malignant neoplasm affecting pregnancy, antepartum, third trimester 03/13/2021   Port-A-Cath in  place 12/31/2020   Genetic testing 12/27/2020   Malignant neoplasm of upper-outer quadrant of right breast in female, estrogen receptor positive ([place]) 12/15/2020   Family history of breast cancer 12/15/2020   Severe preeclampsia, third trimester 02/25/2019   Hyperemesis gravidarum 08/20/2018   Preeclampsia, third trimester 11/15/2015   Pre-eclampsia during pregnancy in third trimester, antepartum 11/15/2015   Preeclampsia 11/15/2015   Mild preeclampsia 11/12/2015   Hypokalemia, inadequate intake 06/17/2015   Ptyalism 05/18/2015   Chronic hypertension in pregnancy 05/18/2015   Morbid obesity with BMI of 40.0-44.9, adult ([place]) 05/18/2015   Rh negative, maternal 05/18/2015    PCP: [name], MD  REFERRING PROVIDER: Dr. Lindi Adie  REFERRING DIAG: Right breast Cancer  THERAPY DIAG:  Aftercare following surgery for neoplasm  Malignant neoplasm of upper-outer quadrant of right female breast, unspecified estrogen receptor status [facility])  Rationale for Evaluation and Treatment Rehabilitation  ONSET DATE: Nov 2022  SUBJECTIVE:  SUBJECTIVE STATEMENT: Pt is s/p Right mastectomy with ALNB and 24 LN's removed on 07/05/2021 Pt is in week 3 of radiation. Just starting to get some itching and darkening of her skin. Fatigue at the end of the day. I only feel a little tightness with ROM.  I had a Pet scan in July and it showed  a Seroma. She had 24 LN's removed. Every 3 weeks she takes Kadcyla for 1 year. My son is 61 months old now.  PERTINENT HISTORY:  (Prior) She is [redacted] weeks pregnant. She had an abnormal noninvasive pregnancy testing due to her high risk pregnancy. This also coincided with the presence of a right breast mass. She is on a study at the [facility] that she was being evaluated for all of this called the  identify study.  She underwent a full body MRI. This showed several right axillary and subpectoral nodes the largest measuring 3.7 cm as well as multiple right breast nodules. She also had a prominent 1.4 cm left supraclavicular node. She was then evaluated here with mammogram and ultrasound. Mammogram shows several small masses measuring between 6 to 12 mm. There is also 10 x 10 x 10 cm of calcifications. She has at least 5 abnormal lymph nodes. Biopsy of one of the lymph nodes is positive. 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  She is due to have a port Friday and start chemotherapy the same day. She is also due to have additional biopsies of the left supra clavicular node as well as the calcifications tomorrow. She does have a family history of maternal cousin at age 60 is deceased from breast cancer and has genetic testing pending currently. Plan is neoadjuvant chemo, deliver baby, continue chemo. Surgery around May, radiation to follow and antiestrogens. Pt is a Special needs teacher at [employer]  She is now post op Right mastectomy on 07/05/2021 with a left Breast reduction. 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 This was a grade 3. The largest remaining tumor was 8 mm and it was multifocal. There were 24 lymph nodes all removed  with an axillary lymph node dissection. 5 of these had macrometastases present in them. This was a less than 1% ER positive, PR negative, HER2 positive tumor. She is presently undergoing radiation and will have 31 treatments  PATIENT GOALS:  Reassess how my recovery is going related to arm function, pain, and swelling.  PAIN:  Are you having pain? No  PRECAUTIONS: Recent Surgery, right UE Lymphedema risk, Other: Lymphedema risk.  ACTIVITY LEVEL / LEISURE: walking/care of 2 young  children   OBJECTIVE:   PATIENT SURVEYS:  QUICK DASH: 0  OBSERVATIONS:  Incisions healed. 1 small pink area at anterior incision but fully healed. Sweling noted at right inferior armpit which MD indicated is a seroma, but is non-painful. She has been advised to wear her compression bra to help it resolve  POSTURE:  Round shoulders/forward head  LYMPHEDEMA ASSESSMENT:    Limitations Rounded Shoulders;Forward head  07/05/2021 Initial 09/08/2021          AROM     Right Shoulder Extension 55 Degrees  60    Right Shoulder Flexion 163 Degrees  159    Right Shoulder ABduction 175 Degrees  180    Right Shoulder Internal Rotation 62 Degrees  57    Right Shoulder  External Rotation 100 Degrees  100    Left Shoulder Extension 55 Degrees      Left Shoulder Flexion 158 Degrees      Left Shoulder ABduction 162 Degrees      Left Shoulder Internal Rotation 68100 Degrees                        LYMPHEDEMA/ONCOLOGY QUESTIONNAIRE - 12/23/20 0001                  Type     Cancer Type Right IDC            Treatment     Active Chemotherapy Treatment Yes   starting tomorrow     Past Chemotherapy Treatment No      Active Radiation Treatment No      Past Radiation Treatment No      Current Hormone Treatment No      Past Hormone Therapy No            Right Upper Extremity Lymphedema     10 cm Proximal to Olecranon Process 40.3 cm  38.4    Olecranon Process 28.5 cm  28.3    10 cm Proximal to Ulnar Styloid Process 26 cm  25.9    Just Proximal to Ulnar Styloid Process 17.8 cm  17.4    At Base of 2nd Digit 6.4 cm  6.3          Left Upper Extremity Lymphedema     10 cm Proximal to Olecranon Process 38.4 cm  38.7    Olecranon Process 29 cm  29.2    10 cm Proximal to Ulnar Styloid Process 24.5 cm  25.0    Just Proximal to Ulnar Styloid Process 17.3 cm  17.4    At Base of 2nd Digit 6.4 cm  6.25                              Surgery type/Date: 07/05/2021 Number of lymph nodes  removed: 24 Current/past treatment (chemo, radiation, hormone therapy): chemo, pending hormone treatment Other symptoms:  Heaviness/tightness No Pain No Pitting edema No Infections No Decreased scar mobility Yes Stemmer sign No   PATIENT EDUCATION:  Education details: Scar massage, continue to wear compression bra, Sozo screens, Lymphedema and skin care Person educated: Patient Education method: Explanation, Demonstration, and Handouts Education comprehension: verbalized understanding   HOME EXERCISE PROGRAM: Verbally Reviewed previously given post op HEP.   ASSESSMENT:  CLINICAL IMPRESSION: Pt is s/p right mastectomy with ALNB and left breast reduction on 07/05/2021. She is making excellent progress with no swelling in the arm, and excellent ROM with a quick dash of O. She has a little swelling inferior to the right axilla and will wear her compression bra to try and reduce this. It is non painful. We talked at length about lymphedema and precautions and she will be measured for a prophylactic sleeve next Wednesday. She will attend the online ABC class next Monday and she was set up for her next SOZO screen. She is doing exceptionally well overall.  Pt will benefit from skilled therapeutic intervention to improve on the following deficits: Decreased knowledge of precautions, impaired UE functional use, pain, decreased ROM, postural dysfunction.   PT treatment/interventions: ADL/Self care home management, Therapeutic exercises, Patient/Family education, Self Care, Manual lymph drainage, and scar mobilization     GOALS: Goals reviewed with patient? Yes  LONG TERM  GOALS:  (STG=LTG)  GOALS Name Target Date  Goal status  1 Pt will demonstrate she has regained full shoulder ROM and function post operatively compared to baselines.  Baseline: 09/08/2021 MET  _0 PLAN: PT FREQUENCY/DURATION: no further appts scheduled at this time. No needs established  PLAN  FOR NEXT SESSION: No further needs identified. Pt will attend ABC class and next SOZO screen. Being fit for compression sleeve next Wednesday.  PHYSICAL THERAPY DISCHARGE SUMMARY  Visits from Start of Care: 2  Current functional level related to goals / functional outcomes: Achieved goals   Remaining deficits: Pt to attend ABC class, continue SOZO screens   Education / Equipment: Will purchase sleeve next week   Patient agrees to discharge. Patient goals were met. Patient is being discharged due to meeting the stated rehab goals.  [facility]  [address]  [place] [address]  [phone]  After Breast Cancer Class It is recommended you attend the ABC class to be educated on lymphedema risk reduction. This class is free of charge and lasts for 1 hour. It is a 1-time class. You will need to download the Webex app either on your phone or computer. We will send you a link the night before or the morning of the class. You should be able to click on that link to join the class. This is not a confidential class. You don't have to turn your camera on, but other participants may be able to see your email address.  Scar massage You can begin gentle scar massage to you incision sites. Gently place one hand on the incision and move the skin (without sliding on the skin) in various directions. Do this for a few minutes and then you can gently massage either coconut oil or vitamin E cream into the scars.  Compression garment You should continue wearing your compression bra until you feel like you no longer have swelling.  Home exercise Program Continue doing the exercises you were given until you feel like you can do them without feeling any tightness at the end.   Walking Program Studies show that 30 minutes of walking per day (fast enough to elevate your heart rate) can significantly reduce the risk of a cancer recurrence. If you can't walk due to other  medical reasons, we encourage you to find another activity you could do (like a stationary bike or water exercise).  Posture After breast cancer surgery, people frequently sit with rounded shoulders posture because it puts their incisions on slack and feels better. If you sit like this and scar tissue forms in that position, you can become very tight and have pain sitting or standing with good posture. Try to be aware of your posture and sit and stand up tall to heal properly.  Follow up PT: It is recommended you return every 3 months for the first 3 years following surgery to be assessed on the SOZO machine for an L-Dex score. This helps prevent clinically significant lymphedema in 95% of patients. These follow up screens are 10 minute appointments that you are not billed for.  Claris Pong, PT 09/08/2021, 2:00 PM

## 2021-09-09 ENCOUNTER — Inpatient Hospital Stay: Payer: BC Managed Care – PPO | Attending: Hematology and Oncology | Admitting: Hematology and Oncology

## 2021-09-09 ENCOUNTER — Other Ambulatory Visit: Payer: Self-pay

## 2021-09-09 ENCOUNTER — Inpatient Hospital Stay: Payer: BC Managed Care – PPO

## 2021-09-09 ENCOUNTER — Ambulatory Visit: Payer: BC Managed Care – PPO

## 2021-09-09 VITALS — BP 123/49 | HR 70 | Temp 97.9°F | Resp 18

## 2021-09-09 DIAGNOSIS — D509 Iron deficiency anemia, unspecified: Secondary | ICD-10-CM

## 2021-09-09 DIAGNOSIS — Z170 Estrogen receptor positive status [ER+]: Secondary | ICD-10-CM

## 2021-09-09 DIAGNOSIS — Z5112 Encounter for antineoplastic immunotherapy: Secondary | ICD-10-CM | POA: Insufficient documentation

## 2021-09-09 DIAGNOSIS — C50411 Malignant neoplasm of upper-outer quadrant of right female breast: Secondary | ICD-10-CM | POA: Diagnosis present

## 2021-09-09 DIAGNOSIS — Z95828 Presence of other vascular implants and grafts: Secondary | ICD-10-CM

## 2021-09-09 LAB — CBC WITH DIFFERENTIAL/PLATELET
Abs Immature Granulocytes: 0.01 10*3/uL (ref 0.00–0.07)
Basophils Absolute: 0 10*3/uL (ref 0.0–0.1)
Basophils Relative: 1 %
Eosinophils Absolute: 0.1 10*3/uL (ref 0.0–0.5)
Eosinophils Relative: 2 %
HCT: 33.8 % — ABNORMAL LOW (ref 36.0–46.0)
Hemoglobin: 11.1 g/dL — ABNORMAL LOW (ref 12.0–15.0)
Immature Granulocytes: 0 %
Lymphocytes Relative: 12 %
Lymphs Abs: 0.4 10*3/uL — ABNORMAL LOW (ref 0.7–4.0)
MCH: 25.8 pg — ABNORMAL LOW (ref 26.0–34.0)
MCHC: 32.8 g/dL (ref 30.0–36.0)
MCV: 78.6 fL — ABNORMAL LOW (ref 80.0–100.0)
Monocytes Absolute: 0.4 10*3/uL (ref 0.1–1.0)
Monocytes Relative: 12 %
Neutro Abs: 2.6 10*3/uL (ref 1.7–7.7)
Neutrophils Relative %: 73 %
Platelets: 278 10*3/uL (ref 150–400)
RBC: 4.3 MIL/uL (ref 3.87–5.11)
RDW: 16.8 % — ABNORMAL HIGH (ref 11.5–15.5)
WBC: 3.5 10*3/uL — ABNORMAL LOW (ref 4.0–10.5)
nRBC: 0 % (ref 0.0–0.2)

## 2021-09-09 LAB — COMPREHENSIVE METABOLIC PANEL
ALT: 25 U/L (ref 0–44)
AST: 30 U/L (ref 15–41)
Albumin: 3.8 g/dL (ref 3.5–5.0)
Alkaline Phosphatase: 101 U/L (ref 38–126)
Anion gap: 7 (ref 5–15)
BUN: 9 mg/dL (ref 6–20)
CO2: 27 mmol/L (ref 22–32)
Calcium: 9.4 mg/dL (ref 8.9–10.3)
Chloride: 104 mmol/L (ref 98–111)
Creatinine, Ser: 0.7 mg/dL (ref 0.44–1.00)
GFR, Estimated: >60 mL/min (ref >60)
Glucose, Bld: 117 mg/dL — ABNORMAL HIGH (ref 70–99)
Potassium: 3.4 mmol/L — ABNORMAL LOW (ref 3.5–5.1)
Sodium: 138 mmol/L (ref 135–145)
Total Bilirubin: 0.4 mg/dL (ref 0.3–1.2)
Total Protein: 7.4 g/dL (ref 6.5–8.1)

## 2021-09-09 LAB — FERRITIN: Ferritin: 333 ng/mL — ABNORMAL HIGH (ref 11–307)

## 2021-09-09 LAB — IRON AND IRON BINDING CAPACITY (CC-WL,HP ONLY)
Iron: 68 ug/dL (ref 28–170)
Saturation Ratios: 20 % (ref 10.4–31.8)
TIBC: 336 ug/dL (ref 250–450)
UIBC: 268 ug/dL (ref 148–442)

## 2021-09-09 MED ORDER — HEPARIN SOD (PORK) LOCK FLUSH 100 UNIT/ML IV SOLN
500.0000 [IU] | Freq: Once | INTRAVENOUS | Status: AC | PRN
  Administered 2021-09-09: 500 [IU]

## 2021-09-09 MED ORDER — DIPHENHYDRAMINE HCL 25 MG PO CAPS
50.0000 mg | ORAL_CAPSULE | Freq: Once | ORAL | Status: AC
  Administered 2021-09-09: 50 mg via ORAL
  Filled 2021-09-09: qty 2

## 2021-09-09 MED ORDER — ACETAMINOPHEN 325 MG PO TABS
650.0000 mg | ORAL_TABLET | Freq: Once | ORAL | Status: AC
  Administered 2021-09-09: 650 mg via ORAL
  Filled 2021-09-09: qty 2

## 2021-09-09 MED ORDER — SODIUM CHLORIDE 0.9% FLUSH
10.0000 mL | Freq: Once | INTRAVENOUS | Status: AC
  Administered 2021-09-09: 10 mL

## 2021-09-09 MED ORDER — SODIUM CHLORIDE 0.9 % IV SOLN: Freq: Once | INTRAVENOUS | Status: AC

## 2021-09-09 MED ORDER — SODIUM CHLORIDE 0.9 % IV SOLN
3.6000 mg/kg | Freq: Once | INTRAVENOUS | Status: AC
  Administered 2021-09-09: 460 mg via INTRAVENOUS
  Filled 2021-09-09: qty 8

## 2021-09-09 MED ORDER — SODIUM CHLORIDE 0.9% FLUSH
10.0000 mL | INTRAVENOUS | Status: DC | PRN
  Administered 2021-09-09: 10 mL

## 2021-09-09 MED ORDER — ONDANSETRON HCL 8 MG PO TABS: 8.0000 mg | ORAL_TABLET | Freq: Three times a day (TID) | ORAL | 1 refills | Status: DC | PRN

## 2021-09-09 NOTE — Patient Instructions (Signed)
[facility] MEDICAL [facility]COLOGY  Discharge Instructions: Thank you for choosing [facility] to provide your oncology and hematology care.   If you have a lab appointment with the Cancer Center, please go directly to the Cancer Center and check in at the registration area.   Wear comfortable clothing and clothing appropriate for easy access to any Portacath or PICC line.   We strive to give you quality time with your provider. You may need to reschedule your appointment if you arrive late (15 or more minutes).  Arriving late affects you and other patients whose appointments are after yours.  Also, if you miss three or more appointments without notifying the office, you may be dismissed from the clinic at the provider's discretion.      For prescription refill requests, have your pharmacy contact our office and allow 72 hours for refills to be completed.    Today you received the following chemotherapy and/or immunotherapy agents : Kadcyla      To help prevent nausea and vomiting after your treatment, we encourage you to take your nausea medication as directed.  BELOW ARE SYMPTOMS THAT SHOULD BE REPORTED IMMEDIATELY: *FEVER GREATER THAN 100.4 F (38 C) OR HIGHER *CHILLS OR SWEATING *NAUSEA AND VOMITING THAT IS NOT CONTROLLED WITH YOUR NAUSEA MEDICATION *UNUSUAL SHORTNESS OF BREATH *UNUSUAL BRUISING OR BLEEDING *URINARY PROBLEMS (pain or burning when urinating, or frequent urination) *BOWEL PROBLEMS (unusual diarrhea, constipation, pain near the anus) TENDERNESS IN MOUTH AND THROAT WITH OR WITHOUT PRESENCE OF ULCERS (sore throat, sores in mouth, or a toothache) UNUSUAL RASH, SWELLING OR PAIN  UNUSUAL VAGINAL DISCHARGE OR ITCHING   Items with * indicate a potential emergency and should be followed up as soon as possible or go to the Emergency Department if any problems should occur.  Please show the CHEMOTHERAPY ALERT CARD or IMMUNOTHERAPY ALERT CARD at check-in to  the Emergency Department and triage nurse.  Should you have questions after your visit or need to cancel or reschedule your appointment, please contact [facility]COL[facility]  Dept: [phone]  and follow the prompts.  Office hours are 8:00 a.m. to 4:30 p.m. Monday - Friday. Please note that voicemails left after 4:00 p.m. may not be returned until the following business day.  We are closed weekends and major holidays. You have access to a nurse at all times for urgent questions. Please call the main number to the clinic Dept: [phone] and follow the prompts.   For any non-urgent questions, you may also contact your provider using MyChart. We now offer e-Visits for anyone 18 and older to request care online for non-urgent symptoms. For details visit [email or web address].   Also download the MyChart app! Go to the app store, search "MyChart", open the app, select [facility], and log in with your MyChart username and password.  Masks are optional in the cancer centers. If you would like for your care team to wear a mask while they are taking care of you, please let them know. You may have one support person who is at least 34 years old accompany you for your appointments. 

## 2021-09-09 NOTE — Progress Notes (Signed)
Pt declined to stay for 30 minute observation period post Kadcyla infusion. VSS. No complaints at time of discharge.

## 2021-09-09 NOTE — Assessment & Plan Note (Signed)
12/08/2020:21-week pregnancy: MRI at [facility] noncontrast revealed right axillary mass 3.7 cm, subpectoral lymph nodes, left supraclavicular lymph node, multiple level 2 and 3 lymph nodes in the right axilla, 2 breast masses 1.1 cm largest size. Mammogram in [place] revealed 3 small masses at 12:00 1.2 cm, 0.6 cm, 0.9 cm and 5 abnormal axillary lymph nodes, pleomorphic calcifications measuring 10 cm, biopsy revealed grade 2 IDC with necrosis and calcifications ER 30%, PR 0%, HER2 positive, Ki-67 30 to 40%  PET/CT 03/24/2021: Multifocal right breast cancer, hypermetabolic right axillary, subpectoral, bilateral supraclavicular and mediastinal lymph nodes  Treatment plan: 1. Neoadjuvant chemotherapy with Adriamycin and Cytoxan every 3 weeks x4started 12/24/2020 completed 02/23/2021 2. delivery of the baby 3. Continue neoadjuvant therapy with Taxol Herceptin and Perjeta(Taxol completed 06/09/21) 4.  Mastectomy with ALND: 07/05/21: 8 mm residual HG IDC with LVI, Margins Neg 4. Kadcyla started 07/29/2021 5. Adjuvant radiation therapy(radiation oncology is not certain of benefits of radiation given PET/CT findings) 6. Adjuvant antiestrogen therapy with ovarian function suppression Genetic testing ---------------------------------------------------------------------------------------------------------------------------------- Current treatment: Kadcyla started 07/29/2021, today is cycle 2  Kadcyla toxicities: 1.  Severe nausea, vomiting and diarrhea: Rotavirus infection improved with supportive care  Monitoring closely for toxicities  08/09/2021: PET CT scan: Right mastectomy and right axillary lymph node dissection: Postoperative changes and seroma.  No convincing evidence of distant metastatic disease.  Radiation: Started 08/23/2021 ending 10/03/2021 Return to clinic every 3 weeks for Kadcyla treatment.

## 2021-09-12 ENCOUNTER — Ambulatory Visit: Payer: BC Managed Care – PPO

## 2021-09-12 ENCOUNTER — Ambulatory Visit: Payer: BC Managed Care – PPO | Admitting: Radiation Oncology

## 2021-09-12 ENCOUNTER — Other Ambulatory Visit: Payer: Self-pay

## 2021-09-12 ENCOUNTER — Ambulatory Visit
Admission: RE | Admit: 2021-09-12 | Discharge: 2021-09-12 | Disposition: A | Discharge status: Home / Self Care | Payer: BC Managed Care – PPO | Source: Ambulatory Visit | Attending: Radiation Oncology | Admitting: Radiation Oncology

## 2021-09-12 DIAGNOSIS — C50411 Malignant neoplasm of upper-outer quadrant of right female breast: Secondary | ICD-10-CM | POA: Diagnosis not present

## 2021-09-12 LAB — RAD ONC ARIA SESSION SUMMARY
Course Elapsed Days: 21
Plan Fractions Treated to Date: 15
Plan Fractions Treated to Date: 15
Plan Fractions Treated to Date: 8
Plan Prescribed Dose Per Fraction: 2 Gy
Plan Prescribed Dose Per Fraction: 2 Gy
Plan Prescribed Dose Per Fraction: 2 Gy
Plan Total Fractions Prescribed: 13
Plan Total Fractions Prescribed: 25
Plan Total Fractions Prescribed: 25
Plan Total Prescribed Dose: 26 Gy
Plan Total Prescribed Dose: 50 Gy
Plan Total Prescribed Dose: 50 Gy
Reference Point Dosage Given to Date: 30 Gy
Reference Point Dosage Given to Date: 30 Gy
Reference Point Dosage Given to Date: 30 Gy
Reference Point Session Dosage Given: 2 Gy
Reference Point Session Dosage Given: 2 Gy
Reference Point Session Dosage Given: 2 Gy
Session Number: 15

## 2021-09-13 ENCOUNTER — Ambulatory Visit
Admission: RE | Admit: 2021-09-13 | Discharge: 2021-09-13 | Disposition: A | Discharge status: Home / Self Care | Payer: BC Managed Care – PPO | Source: Ambulatory Visit | Attending: Radiation Oncology | Admitting: Radiation Oncology

## 2021-09-13 ENCOUNTER — Other Ambulatory Visit: Payer: Self-pay

## 2021-09-13 DIAGNOSIS — C50411 Malignant neoplasm of upper-outer quadrant of right female breast: Secondary | ICD-10-CM | POA: Diagnosis not present

## 2021-09-13 DIAGNOSIS — Z5112 Encounter for antineoplastic immunotherapy: Secondary | ICD-10-CM | POA: Diagnosis not present

## 2021-09-13 LAB — RAD ONC ARIA SESSION SUMMARY
Course Elapsed Days: 22
Plan Fractions Treated to Date: 16
Plan Fractions Treated to Date: 16
Plan Fractions Treated to Date: 8
Plan Prescribed Dose Per Fraction: 2 Gy
Plan Prescribed Dose Per Fraction: 2 Gy
Plan Prescribed Dose Per Fraction: 2 Gy
Plan Total Fractions Prescribed: 12
Plan Total Fractions Prescribed: 25
Plan Total Fractions Prescribed: 25
Plan Total Prescribed Dose: 24 Gy
Plan Total Prescribed Dose: 50 Gy
Plan Total Prescribed Dose: 50 Gy
Reference Point Dosage Given to Date: 32 Gy
Reference Point Dosage Given to Date: 32 Gy
Reference Point Dosage Given to Date: 32 Gy
Reference Point Session Dosage Given: 2 Gy
Reference Point Session Dosage Given: 2 Gy
Reference Point Session Dosage Given: 2 Gy
Session Number: 16

## 2021-09-14 ENCOUNTER — Ambulatory Visit
Admission: RE | Admit: 2021-09-14 | Discharge: 2021-09-14 | Disposition: A | Discharge status: Home / Self Care | Payer: BC Managed Care – PPO | Source: Ambulatory Visit | Attending: Radiation Oncology | Admitting: Radiation Oncology

## 2021-09-14 ENCOUNTER — Other Ambulatory Visit: Payer: Self-pay

## 2021-09-14 DIAGNOSIS — C50411 Malignant neoplasm of upper-outer quadrant of right female breast: Secondary | ICD-10-CM | POA: Diagnosis not present

## 2021-09-14 LAB — RAD ONC ARIA SESSION SUMMARY
Course Elapsed Days: 23
Plan Fractions Treated to Date: 17
Plan Fractions Treated to Date: 17
Plan Fractions Treated to Date: 9
Plan Prescribed Dose Per Fraction: 2 Gy
Plan Prescribed Dose Per Fraction: 2 Gy
Plan Prescribed Dose Per Fraction: 2 Gy
Plan Total Fractions Prescribed: 13
Plan Total Fractions Prescribed: 25
Plan Total Fractions Prescribed: 25
Plan Total Prescribed Dose: 26 Gy
Plan Total Prescribed Dose: 50 Gy
Plan Total Prescribed Dose: 50 Gy
Reference Point Dosage Given to Date: 34 Gy
Reference Point Dosage Given to Date: 34 Gy
Reference Point Dosage Given to Date: 34 Gy
Reference Point Session Dosage Given: 2 Gy
Reference Point Session Dosage Given: 2 Gy
Reference Point Session Dosage Given: 2 Gy
Session Number: 17

## 2021-09-15 ENCOUNTER — Ambulatory Visit
Admission: RE | Admit: 2021-09-15 | Discharge: 2021-09-15 | Disposition: A | Discharge status: Home / Self Care | Payer: BC Managed Care – PPO | Source: Ambulatory Visit | Attending: Radiation Oncology | Admitting: Radiation Oncology

## 2021-09-15 ENCOUNTER — Other Ambulatory Visit: Payer: Self-pay

## 2021-09-15 DIAGNOSIS — C50411 Malignant neoplasm of upper-outer quadrant of right female breast: Secondary | ICD-10-CM | POA: Diagnosis not present

## 2021-09-15 LAB — RAD ONC ARIA SESSION SUMMARY
Course Elapsed Days: 24
Plan Fractions Treated to Date: 18
Plan Fractions Treated to Date: 18
Plan Fractions Treated to Date: 9
Plan Prescribed Dose Per Fraction: 2 Gy
Plan Prescribed Dose Per Fraction: 2 Gy
Plan Prescribed Dose Per Fraction: 2 Gy
Plan Total Fractions Prescribed: 12
Plan Total Fractions Prescribed: 25
Plan Total Fractions Prescribed: 25
Plan Total Prescribed Dose: 24 Gy
Plan Total Prescribed Dose: 50 Gy
Plan Total Prescribed Dose: 50 Gy
Reference Point Dosage Given to Date: 36 Gy
Reference Point Dosage Given to Date: 36 Gy
Reference Point Dosage Given to Date: 36 Gy
Reference Point Session Dosage Given: 2 Gy
Reference Point Session Dosage Given: 2 Gy
Reference Point Session Dosage Given: 2 Gy
Session Number: 18

## 2021-09-16 ENCOUNTER — Ambulatory Visit
Admission: RE | Admit: 2021-09-16 | Discharge: 2021-09-16 | Disposition: A | Discharge status: Home / Self Care | Payer: BC Managed Care – PPO | Source: Ambulatory Visit | Attending: Radiation Oncology | Admitting: Radiation Oncology

## 2021-09-16 ENCOUNTER — Other Ambulatory Visit: Payer: Self-pay

## 2021-09-16 DIAGNOSIS — C50411 Malignant neoplasm of upper-outer quadrant of right female breast: Secondary | ICD-10-CM | POA: Diagnosis not present

## 2021-09-16 LAB — RAD ONC ARIA SESSION SUMMARY
Course Elapsed Days: 25
Plan Fractions Treated to Date: 10
Plan Fractions Treated to Date: 19
Plan Fractions Treated to Date: 19
Plan Prescribed Dose Per Fraction: 2 Gy
Plan Prescribed Dose Per Fraction: 2 Gy
Plan Prescribed Dose Per Fraction: 2 Gy
Plan Total Fractions Prescribed: 13
Plan Total Fractions Prescribed: 25
Plan Total Fractions Prescribed: 25
Plan Total Prescribed Dose: 26 Gy
Plan Total Prescribed Dose: 50 Gy
Plan Total Prescribed Dose: 50 Gy
Reference Point Dosage Given to Date: 38 Gy
Reference Point Dosage Given to Date: 38 Gy
Reference Point Dosage Given to Date: 38 Gy
Reference Point Session Dosage Given: 2 Gy
Reference Point Session Dosage Given: 2 Gy
Reference Point Session Dosage Given: 2 Gy
Session Number: 19

## 2021-09-19 ENCOUNTER — Ambulatory Visit
Admission: RE | Admit: 2021-09-19 | Discharge: 2021-09-19 | Disposition: A | Discharge status: Home / Self Care | Payer: BC Managed Care – PPO | Source: Ambulatory Visit | Attending: Radiation Oncology | Admitting: Radiation Oncology

## 2021-09-19 ENCOUNTER — Ambulatory Visit: Payer: BC Managed Care – PPO

## 2021-09-19 ENCOUNTER — Other Ambulatory Visit: Payer: Self-pay

## 2021-09-19 ENCOUNTER — Ambulatory Visit
Admission: RE | Admit: 2021-09-19 | Payer: BC Managed Care – PPO | Source: Ambulatory Visit | Admitting: Radiation Oncology

## 2021-09-19 DIAGNOSIS — C50411 Malignant neoplasm of upper-outer quadrant of right female breast: Secondary | ICD-10-CM | POA: Diagnosis not present

## 2021-09-19 LAB — RAD ONC ARIA SESSION SUMMARY
Course Elapsed Days: 28
Plan Fractions Treated to Date: 10
Plan Fractions Treated to Date: 20
Plan Fractions Treated to Date: 20
Plan Prescribed Dose Per Fraction: 2 Gy
Plan Prescribed Dose Per Fraction: 2 Gy
Plan Prescribed Dose Per Fraction: 2 Gy
Plan Total Fractions Prescribed: 12
Plan Total Fractions Prescribed: 25
Plan Total Fractions Prescribed: 25
Plan Total Prescribed Dose: 24 Gy
Plan Total Prescribed Dose: 50 Gy
Plan Total Prescribed Dose: 50 Gy
Reference Point Dosage Given to Date: 40 Gy
Reference Point Dosage Given to Date: 40 Gy
Reference Point Dosage Given to Date: 40 Gy
Reference Point Session Dosage Given: 2 Gy
Reference Point Session Dosage Given: 2 Gy
Reference Point Session Dosage Given: 2 Gy
Session Number: 20

## 2021-09-19 MED ORDER — SILVER SULFADIAZINE 1 % EX CREA: TOPICAL_CREAM | Freq: Two times a day (BID) | CUTANEOUS | Status: DC

## 2021-09-20 ENCOUNTER — Other Ambulatory Visit: Payer: Self-pay

## 2021-09-20 ENCOUNTER — Ambulatory Visit: Payer: BC Managed Care – PPO

## 2021-09-20 ENCOUNTER — Ambulatory Visit: Payer: BC Managed Care – PPO | Admitting: Radiation Oncology

## 2021-09-20 ENCOUNTER — Ambulatory Visit
Admission: RE | Admit: 2021-09-20 | Discharge: 2021-09-20 | Disposition: A | Discharge status: Home / Self Care | Payer: BC Managed Care – PPO | Source: Ambulatory Visit | Attending: Radiation Oncology | Admitting: Radiation Oncology

## 2021-09-20 DIAGNOSIS — C50411 Malignant neoplasm of upper-outer quadrant of right female breast: Secondary | ICD-10-CM | POA: Diagnosis not present

## 2021-09-20 LAB — RAD ONC ARIA SESSION SUMMARY
Course Elapsed Days: 29
Plan Fractions Treated to Date: 11
Plan Fractions Treated to Date: 21
Plan Fractions Treated to Date: 21
Plan Prescribed Dose Per Fraction: 2 Gy
Plan Prescribed Dose Per Fraction: 2 Gy
Plan Prescribed Dose Per Fraction: 2 Gy
Plan Total Fractions Prescribed: 13
Plan Total Fractions Prescribed: 25
Plan Total Fractions Prescribed: 25
Plan Total Prescribed Dose: 26 Gy
Plan Total Prescribed Dose: 50 Gy
Plan Total Prescribed Dose: 50 Gy
Reference Point Dosage Given to Date: 42 Gy
Reference Point Dosage Given to Date: 42 Gy
Reference Point Dosage Given to Date: 42 Gy
Reference Point Session Dosage Given: 2 Gy
Reference Point Session Dosage Given: 2 Gy
Reference Point Session Dosage Given: 2 Gy
Session Number: 21

## 2021-09-21 ENCOUNTER — Other Ambulatory Visit: Payer: Self-pay

## 2021-09-21 ENCOUNTER — Ambulatory Visit
Admission: RE | Admit: 2021-09-21 | Discharge: 2021-09-21 | Disposition: A | Discharge status: Home / Self Care | Payer: BC Managed Care – PPO | Source: Ambulatory Visit | Attending: Radiation Oncology | Admitting: Radiation Oncology

## 2021-09-21 DIAGNOSIS — C50411 Malignant neoplasm of upper-outer quadrant of right female breast: Secondary | ICD-10-CM | POA: Diagnosis not present

## 2021-09-21 LAB — RAD ONC ARIA SESSION SUMMARY
Course Elapsed Days: 30
Plan Fractions Treated to Date: 11
Plan Fractions Treated to Date: 22
Plan Fractions Treated to Date: 22
Plan Prescribed Dose Per Fraction: 2 Gy
Plan Prescribed Dose Per Fraction: 2 Gy
Plan Prescribed Dose Per Fraction: 2 Gy
Plan Total Fractions Prescribed: 12
Plan Total Fractions Prescribed: 25
Plan Total Fractions Prescribed: 25
Plan Total Prescribed Dose: 24 Gy
Plan Total Prescribed Dose: 50 Gy
Plan Total Prescribed Dose: 50 Gy
Reference Point Dosage Given to Date: 44 Gy
Reference Point Dosage Given to Date: 44 Gy
Reference Point Dosage Given to Date: 44 Gy
Reference Point Session Dosage Given: 2 Gy
Reference Point Session Dosage Given: 2 Gy
Reference Point Session Dosage Given: 2 Gy
Session Number: 22

## 2021-09-22 ENCOUNTER — Other Ambulatory Visit: Payer: Self-pay

## 2021-09-22 ENCOUNTER — Ambulatory Visit
Admission: RE | Admit: 2021-09-22 | Discharge: 2021-09-22 | Disposition: A | Discharge status: Home / Self Care | Payer: BC Managed Care – PPO | Source: Ambulatory Visit | Attending: Radiation Oncology | Admitting: Radiation Oncology

## 2021-09-22 DIAGNOSIS — C50411 Malignant neoplasm of upper-outer quadrant of right female breast: Secondary | ICD-10-CM | POA: Diagnosis not present

## 2021-09-22 LAB — RAD ONC ARIA SESSION SUMMARY
Course Elapsed Days: 31
Plan Fractions Treated to Date: 12
Plan Fractions Treated to Date: 23
Plan Fractions Treated to Date: 23
Plan Prescribed Dose Per Fraction: 2 Gy
Plan Prescribed Dose Per Fraction: 2 Gy
Plan Prescribed Dose Per Fraction: 2 Gy
Plan Total Fractions Prescribed: 13
Plan Total Fractions Prescribed: 25
Plan Total Fractions Prescribed: 25
Plan Total Prescribed Dose: 26 Gy
Plan Total Prescribed Dose: 50 Gy
Plan Total Prescribed Dose: 50 Gy
Reference Point Dosage Given to Date: 46 Gy
Reference Point Dosage Given to Date: 46 Gy
Reference Point Dosage Given to Date: 46 Gy
Reference Point Session Dosage Given: 2 Gy
Reference Point Session Dosage Given: 2 Gy
Reference Point Session Dosage Given: 2 Gy
Session Number: 23

## 2021-09-23 ENCOUNTER — Other Ambulatory Visit: Payer: Self-pay

## 2021-09-23 ENCOUNTER — Ambulatory Visit
Admission: RE | Admit: 2021-09-23 | Discharge: 2021-09-23 | Disposition: A | Discharge status: Home / Self Care | Payer: BC Managed Care – PPO | Source: Ambulatory Visit | Attending: Radiation Oncology | Admitting: Radiation Oncology

## 2021-09-23 DIAGNOSIS — C50411 Malignant neoplasm of upper-outer quadrant of right female breast: Secondary | ICD-10-CM | POA: Diagnosis present

## 2021-09-23 DIAGNOSIS — Z170 Estrogen receptor positive status [ER+]: Secondary | ICD-10-CM | POA: Insufficient documentation

## 2021-09-23 LAB — RAD ONC ARIA SESSION SUMMARY
Course Elapsed Days: 32
Plan Fractions Treated to Date: 12
Plan Fractions Treated to Date: 24
Plan Fractions Treated to Date: 24
Plan Prescribed Dose Per Fraction: 2 Gy
Plan Prescribed Dose Per Fraction: 2 Gy
Plan Prescribed Dose Per Fraction: 2 Gy
Plan Total Fractions Prescribed: 12
Plan Total Fractions Prescribed: 25
Plan Total Fractions Prescribed: 25
Plan Total Prescribed Dose: 24 Gy
Plan Total Prescribed Dose: 50 Gy
Plan Total Prescribed Dose: 50 Gy
Reference Point Dosage Given to Date: 48 Gy
Reference Point Dosage Given to Date: 48 Gy
Reference Point Dosage Given to Date: 48 Gy
Reference Point Session Dosage Given: 2 Gy
Reference Point Session Dosage Given: 2 Gy
Reference Point Session Dosage Given: 2 Gy
Session Number: 24

## 2021-09-27 ENCOUNTER — Ambulatory Visit
Admission: RE | Admit: 2021-09-27 | Discharge: 2021-09-27 | Disposition: A | Discharge status: Home / Self Care | Payer: BC Managed Care – PPO | Source: Ambulatory Visit | Attending: Radiation Oncology | Admitting: Radiation Oncology

## 2021-09-27 ENCOUNTER — Other Ambulatory Visit: Payer: Self-pay

## 2021-09-27 ENCOUNTER — Other Ambulatory Visit: Payer: Self-pay | Admitting: Hematology and Oncology

## 2021-09-27 ENCOUNTER — Ambulatory Visit: Payer: BC Managed Care – PPO

## 2021-09-27 ENCOUNTER — Encounter: Payer: Self-pay | Admitting: Radiation Oncology

## 2021-09-27 ENCOUNTER — Telehealth: Payer: Self-pay | Admitting: *Deleted

## 2021-09-27 DIAGNOSIS — C50411 Malignant neoplasm of upper-outer quadrant of right female breast: Secondary | ICD-10-CM

## 2021-09-27 DIAGNOSIS — Z170 Estrogen receptor positive status [ER+]: Secondary | ICD-10-CM

## 2021-09-27 LAB — RAD ONC ARIA SESSION SUMMARY
Course Elapsed Days: 36
Plan Fractions Treated to Date: 13
Plan Fractions Treated to Date: 25
Plan Fractions Treated to Date: 25
Plan Prescribed Dose Per Fraction: 2 Gy
Plan Prescribed Dose Per Fraction: 2 Gy
Plan Prescribed Dose Per Fraction: 2 Gy
Plan Total Fractions Prescribed: 13
Plan Total Fractions Prescribed: 25
Plan Total Fractions Prescribed: 25
Plan Total Prescribed Dose: 26 Gy
Plan Total Prescribed Dose: 50 Gy
Plan Total Prescribed Dose: 50 Gy
Reference Point Dosage Given to Date: 50 Gy
Reference Point Dosage Given to Date: 50 Gy
Reference Point Dosage Given to Date: 50 Gy
Reference Point Session Dosage Given: 2 Gy
Reference Point Session Dosage Given: 2 Gy
Reference Point Session Dosage Given: 2 Gy
Session Number: 25

## 2021-09-27 NOTE — Telephone Encounter (Signed)
CALLED PATIENT TO INFORM OF FU APPT. ON 10-28-21@ 11:40 AM, LVM FOR A RETURN CALL

## 2021-09-28 ENCOUNTER — Ambulatory Visit: Payer: BC Managed Care – PPO

## 2021-09-29 ENCOUNTER — Telehealth: Payer: Self-pay | Admitting: *Deleted

## 2021-09-29 ENCOUNTER — Ambulatory Visit: Payer: BC Managed Care – PPO

## 2021-09-29 NOTE — Telephone Encounter (Signed)
[place] [address] Disability paperwork completed yesterday, returned and received by this nurse today from collaborative signed by provider.  Returned to [employer].  Process completed with copy to H.I.M. bin designated for items to be scanned completes process.  Original copy in envelope addressed to [place] to alphabetical file folder for patient pick up near registrar number one of [facility] appointment registration work area.  No further instructions received, actions required or performed by this nurse.

## 2021-09-30 ENCOUNTER — Inpatient Hospital Stay: Payer: BC Managed Care – PPO

## 2021-09-30 ENCOUNTER — Other Ambulatory Visit: Payer: Self-pay

## 2021-09-30 ENCOUNTER — Ambulatory Visit: Payer: BC Managed Care – PPO

## 2021-09-30 ENCOUNTER — Inpatient Hospital Stay: Payer: BC Managed Care – PPO | Admitting: Hematology and Oncology

## 2021-09-30 ENCOUNTER — Inpatient Hospital Stay (HOSPITAL_BASED_OUTPATIENT_CLINIC_OR_DEPARTMENT_OTHER): Payer: BC Managed Care – PPO | Admitting: Adult Health

## 2021-09-30 ENCOUNTER — Inpatient Hospital Stay: Payer: BC Managed Care – PPO | Attending: Hematology and Oncology

## 2021-09-30 DIAGNOSIS — Z170 Estrogen receptor positive status [ER+]: Secondary | ICD-10-CM

## 2021-09-30 DIAGNOSIS — C50411 Malignant neoplasm of upper-outer quadrant of right female breast: Secondary | ICD-10-CM

## 2021-09-30 DIAGNOSIS — D509 Iron deficiency anemia, unspecified: Secondary | ICD-10-CM | POA: Diagnosis not present

## 2021-09-30 DIAGNOSIS — Z95828 Presence of other vascular implants and grafts: Secondary | ICD-10-CM

## 2021-09-30 DIAGNOSIS — Z5112 Encounter for antineoplastic immunotherapy: Secondary | ICD-10-CM | POA: Insufficient documentation

## 2021-09-30 LAB — CMP (CANCER CENTER ONLY)
ALT: 15 U/L (ref 0–44)
AST: 23 U/L (ref 15–41)
Albumin: 3.6 g/dL (ref 3.5–5.0)
Alkaline Phosphatase: 98 U/L (ref 38–126)
Anion gap: 6 (ref 5–15)
BUN: 7 mg/dL (ref 6–20)
CO2: 27 mmol/L (ref 22–32)
Calcium: 9.4 mg/dL (ref 8.9–10.3)
Chloride: 104 mmol/L (ref 98–111)
Creatinine: 0.59 mg/dL (ref 0.44–1.00)
GFR, Estimated: >60 mL/min (ref >60)
Glucose, Bld: 98 mg/dL (ref 70–99)
Potassium: 3.4 mmol/L — ABNORMAL LOW (ref 3.5–5.1)
Sodium: 137 mmol/L (ref 135–145)
Total Bilirubin: 0.3 mg/dL (ref 0.3–1.2)
Total Protein: 7.4 g/dL (ref 6.5–8.1)

## 2021-09-30 LAB — CBC WITH DIFFERENTIAL (CANCER CENTER ONLY)
Abs Immature Granulocytes: 0.01 10*3/uL (ref 0.00–0.07)
Basophils Absolute: 0 10*3/uL (ref 0.0–0.1)
Basophils Relative: 1 %
Eosinophils Absolute: 0.1 10*3/uL (ref 0.0–0.5)
Eosinophils Relative: 1 %
HCT: 30.2 % — ABNORMAL LOW (ref 36.0–46.0)
Hemoglobin: 9.9 g/dL — ABNORMAL LOW (ref 12.0–15.0)
Immature Granulocytes: 0 %
Lymphocytes Relative: 9 %
Lymphs Abs: 0.4 10*3/uL — ABNORMAL LOW (ref 0.7–4.0)
MCH: 25.6 pg — ABNORMAL LOW (ref 26.0–34.0)
MCHC: 32.8 g/dL (ref 30.0–36.0)
MCV: 78.2 fL — ABNORMAL LOW (ref 80.0–100.0)
Monocytes Absolute: 0.6 10*3/uL (ref 0.1–1.0)
Monocytes Relative: 13 %
Neutro Abs: 3.4 10*3/uL (ref 1.7–7.7)
Neutrophils Relative %: 76 %
Platelet Count: 233 10*3/uL (ref 150–400)
RBC: 3.86 MIL/uL — ABNORMAL LOW (ref 3.87–5.11)
RDW: 17.1 % — ABNORMAL HIGH (ref 11.5–15.5)
WBC Count: 4.5 10*3/uL (ref 4.0–10.5)
nRBC: 0 % (ref 0.0–0.2)

## 2021-09-30 MED ORDER — SODIUM CHLORIDE 0.9% FLUSH
10.0000 mL | INTRAVENOUS | Status: DC | PRN
  Administered 2021-09-30: 10 mL

## 2021-09-30 MED ORDER — PROCHLORPERAZINE EDISYLATE 10 MG/2ML IJ SOLN
10.0000 mg | Freq: Once | INTRAMUSCULAR | Status: AC
  Administered 2021-09-30: 10 mg via INTRAVENOUS
  Filled 2021-09-30: qty 2

## 2021-09-30 MED ORDER — PROCHLORPERAZINE MALEATE 10 MG PO TABS: 10.0000 mg | ORAL_TABLET | Freq: Once | ORAL | Status: DC

## 2021-09-30 MED ORDER — SODIUM CHLORIDE 0.9 % IV SOLN: Freq: Once | INTRAVENOUS | Status: AC

## 2021-09-30 MED ORDER — SODIUM CHLORIDE 0.9% FLUSH
10.0000 mL | Freq: Once | INTRAVENOUS | Status: AC
  Administered 2021-09-30: 10 mL

## 2021-09-30 MED ORDER — DIPHENHYDRAMINE HCL 25 MG PO CAPS
50.0000 mg | ORAL_CAPSULE | Freq: Once | ORAL | Status: AC
  Administered 2021-09-30: 50 mg via ORAL
  Filled 2021-09-30: qty 2

## 2021-09-30 MED ORDER — SODIUM CHLORIDE 0.9 % IV SOLN
3.6000 mg/kg | Freq: Once | INTRAVENOUS | Status: AC
  Administered 2021-09-30: 420 mg via INTRAVENOUS
  Filled 2021-09-30: qty 8

## 2021-09-30 MED ORDER — ACETAMINOPHEN 325 MG PO TABS
650.0000 mg | ORAL_TABLET | Freq: Once | ORAL | Status: AC
  Administered 2021-09-30: 650 mg via ORAL
  Filled 2021-09-30: qty 2

## 2021-09-30 MED ORDER — HEPARIN SOD (PORK) LOCK FLUSH 100 UNIT/ML IV SOLN
500.0000 [IU] | Freq: Once | INTRAVENOUS | Status: AC | PRN
  Administered 2021-09-30: 500 [IU]

## 2021-09-30 NOTE — Patient Instructions (Signed)
Goserelin Implant What is this medication? GOSERELIN (GOE se rel in) treats prostate cancer and breast cancer. It works by decreasing levels of the hormones testosterone and estrogen in the body. This prevents prostate and breast cancer cells from spreading or growing. It may also be used to treat endometriosis. This is a condition where the tissue that lines the uterus grows outside the uterus. It works by decreasing the amount of estrogen your body makes, which reduces heavy bleeding and pain. It can also be used to help thin the lining of the uterus before a surgery used to prevent or reduce heavy periods. This medicine may be used for other purposes; ask your health care provider or pharmacist if you have questions. COMMON BRAND NAME(S): Zoladex, Zoladex 3-Month What should I tell my care team before I take this medication? They need to know if you have any of these conditions: Bone problems Diabetes Heart disease History of irregular heartbeat or rhythm An unusual or allergic reaction to goserelin, other medications, foods, dyes, or preservatives Pregnant or trying to get pregnant Breastfeeding How should I use this medication? This medication is injected under the skin. It is given by your care team in a hospital or clinic setting. Talk to your care team about the use of this medication in children. Special care may be needed. Overdosage: If you think you have taken too much of this medicine contact a poison control center or emergency room at once. NOTE: This medicine is only for you. Do not share this medicine with others. What if I miss a dose? Keep appointments for follow-up doses. It is important not to miss your dose. Call your care team if you are unable to keep an appointment. What may interact with this medication? Do not take this medication with any of the following: Cisapride Dronedarone Pimozide Thioridazine This medication may also interact with the following: Other  medications that cause heart rhythm changes This list may not describe all possible interactions. Give your health care provider a list of all the medicines, herbs, non-prescription drugs, or dietary supplements you use. Also tell them if you smoke, drink alcohol, or use illegal drugs. Some items may interact with your medicine. What should I watch for while using this medication? Visit your care team for regular checks on your progress. Your symptoms may appear to get worse during the first weeks of this therapy. Tell your care team if your symptoms do not start to get better or if they get worse after this time. Using this medication for a long time may weaken your bones. If you smoke or frequently drink alcohol you may increase your risk of bone loss. A family history of osteoporosis, chronic use of medications for seizures (convulsions), or corticosteroids can also increase your risk of bone loss. The risk of bone fractures may be increased. Talk to your care team about your bone health. This medication may increase blood sugar. The risk may be higher in patients who already have diabetes. Ask your care team what you can do to lower your risk of diabetes while taking this medication. This medication should stop regular monthly menstruation in women. Tell your care team if you continue to menstruate. Talk to your care team if you wish to become pregnant or think you might be pregnant. This medication can cause serious birth defects if taken during pregnancy or for 12 weeks after stopping treatment. Talk to your care team about reliable forms of contraception. Do not breastfeed while taking this   medication. This medication may cause infertility. Talk to your care team if you are concerned about your fertility. What side effects may I notice from receiving this medication? Side effects that you should report to your care team as soon as possible: Allergic reactions--skin rash, itching, hives, swelling  of the face, lips, tongue, or throat Change in the amount of urine Heart attack--pain or tightness in the chest, shoulders, arms, or jaw, nausea, shortness of breath, cold or clammy skin, feeling faint or lightheaded Heart rhythm changes--fast or irregular heartbeat, dizziness, feeling faint or lightheaded, chest pain, trouble breathing High blood sugar (hyperglycemia)--increased thirst or amount of urine, unusual weakness or fatigue, blurry vision High calcium level--increased thirst or amount of urine, nausea, vomiting, confusion, unusual weakness or fatigue, bone pain Pain, redness, irritation, or bruising at the injection site Severe back pain, numbness or weakness of the hands, arms, legs, or feet, loss of coordination, loss of bowel or bladder control Stroke--sudden numbness or weakness of the face, arm, or leg, trouble speaking, confusion, trouble walking, loss of balance or coordination, dizziness, severe headache, change in vision Swelling and pain of the tumor site or lymph nodes Trouble passing urine Side effects that usually do not require medical attention (report to your care team if they continue or are bothersome): Change in sex drive or performance Headache Hot flashes Rapid or extreme change in emotion or mood Sweating Swelling of the ankles, hands, or feet Unusual vaginal discharge, itching, or odor This list may not describe all possible side effects. Call your doctor for medical advice about side effects. You may report side effects to FDA at 1-800-FDA-1088. Where should I keep my medication? This medication is given in a hospital or clinic. It will not be stored at home. NOTE: This sheet is a summary. It may not cover all possible information. If you have questions about this medicine, talk to your doctor, pharmacist, or health care provider.  2023 Elsevier/Gold Standard (2021-05-25 00:00:00) Anastrozole Tablets What is this medication? ANASTROZOLE (an AS troe zole)  treats breast cancer. It works by decreasing the amount of estrogen hormone your body makes, which slows or stops breast cancer cells from spreading or growing. This medicine may be used for other purposes; ask your health care provider or pharmacist if you have questions. COMMON BRAND NAME(S): Arimidex What should I tell my care team before I take this medication? They need to know if you have any of these conditions: Bone problems Heart disease High cholesterol An unusual or allergic reaction to anastrozole, other medications, foods, dyes, or preservatives Pregnant or trying to get pregnant Breast-feeding How should I use this medication? Take this medication by mouth with a glass of water. Follow the directions on the prescription label. You can take it with or without food. If it upsets your stomach, take it with food. Take your medication at regular intervals. Do not take it more often than directed. Do not stop taking except on your care team's advice. Talk to your care team about the use of this medication in children. Special care may be needed. Overdosage: If you think you have taken too much of this medicine contact a poison control center or emergency room at once. NOTE: This medicine is only for you. Do not share this medicine with others. What if I miss a dose? If you miss a dose, take it as soon as you can. If it is almost time for your next dose, take only that dose. Do not take double  or extra doses. What may interact with this medication? Estrogen and progestin hormones Tamoxifen This list may not describe all possible interactions. Give your health care provider a list of all the medicines, herbs, non-prescription drugs, or dietary supplements you use. Also tell them if you smoke, drink alcohol, or use illegal drugs. Some items may interact with your medicine. What should I watch for while using this medication? Visit your care team for regular checks on your progress. Let  your care team know about any unusual vaginal bleeding. Using this medication for a long time may weaken your bones. The risk of bone fractures may be increased. Talk to your care team about your bone health. You should make sure that you get enough calcium and vitamin D while you are taking this medication. Discuss the foods you eat and the vitamins you take with your care team. Talk to your care team if you may be pregnant. Serious birth defects can occur if you take this medication during pregnancy and for 3 weeks after the last dose. You will need a negative pregnancy test before starting this medication. Estrogen and progestin hormones may not work as well while you are taking this medication. Contraception is recommended while taking this medication and for 3 weeks after the last dose. Your care team can help you find the option that works for you. Do not breastfeed while taking this medication and for 2 weeks after the last dose. This medication may cause infertility. Talk with your care team if you are concerned about your fertility. What side effects may I notice from receiving this medication? Side effects that you should report to your care team as soon as possible: Allergic reactions--skin rash, itching, hives, swelling of the face, lips, tongue, or throat Side effects that usually do not require medical attention (report to your care team if they continue or are bothersome): Bone, joint, or muscle pain Headache Hot flashes Nausea Sore throat Swelling of the ankles, hands, or feet Unusual weakness or fatigue This list may not describe all possible side effects. Call your doctor for medical advice about side effects. You may report side effects to FDA at 1-800-FDA-1088. Where should I keep my medication? Keep out of the reach of children and pets. Store at room temperature between 20 and 25 degrees C (68 and 77 degrees F). Get rid of any unused medication after the expiration date. To  get rid of medications that are no longer wanted or have expired: Take the medication to a medication take-back program. Check with your pharmacy or law enforcement to find a location. If you cannot return the medication, check the label or package insert to see if the medication should be thrown out in the garbage or flushed down the toilet. If you are not sure, ask your care team. If it is safe to put it in the trash, empty the medication out of the container. Mix the medication with cat litter, dirt, coffee grounds, or other unwanted substance. Seal the mixture in a bag or container. Put it in the trash. NOTE: This sheet is a summary. It may not cover all possible information. If you have questions about this medicine, talk to your doctor, pharmacist, or health care provider.  2023 Elsevier/Gold Standard (2021-05-27 00:00:00)

## 2021-09-30 NOTE — Assessment & Plan Note (Signed)
Evelyn Marshall is a 36 year old woman with stage IV breast cancer here today for follow-up and evaluation prior to receiving Kadcyla treatment.  She is here for #3 of Kadcyla and her labs are stable I reviewed those with her in detail.  We discussed her slightly low potassium and hemoglobin and I recommended iron rich foods along with potassium rich foods.  She will undergo echo on September 12 and we reviewed this in detail.  She finished radiation early and her skin areas are healing well.  I reviewed Dr. Geralyn Marshall plan with her which includes antiestrogen therapy with anastrozole or letrozole along with Zoladex.  See her back in 3 weeks for labs, follow-up, and her next Kadcyla treatment.

## 2021-09-30 NOTE — Patient Instructions (Signed)
[facility]  Discharge Instructions: Thank you for choosing [facility] to provide your oncology and hematology care.   If you have a lab appointment with the Cancer Center, please go directly to the Cancer Center and check in at the registration area.   Wear comfortable clothing and clothing appropriate for easy access to any Portacath or PICC line.   We strive to give you quality time with your provider. You may need to reschedule your appointment if you arrive late (15 or more minutes).  Arriving late affects you and other patients whose appointments are after yours.  Also, if you miss three or more appointments without notifying the office, you may be dismissed from the clinic at the provider's discretion.      For prescription refill requests, have your pharmacy contact our office and allow 72 hours for refills to be completed.    Today you received the following chemotherapy and/or immunotherapy agents : Kadcyla      To help prevent nausea and vomiting after your treatment, we encourage you to take your nausea medication as directed.  BELOW ARE SYMPTOMS THAT SHOULD BE REPORTED IMMEDIATELY: *FEVER GREATER THAN 100.4 F (38 C) OR HIGHER *CHILLS OR SWEATING *NAUSEA AND VOMITING THAT IS NOT CONTROLLED WITH YOUR NAUSEA MEDICATION *UNUSUAL SHORTNESS OF BREATH *UNUSUAL BRUISING OR BLEEDING *URINARY PROBLEMS (pain or burning when urinating, or frequent urination) *BOWEL PROBLEMS (unusual diarrhea, constipation, pain near the anus) TENDERNESS IN MOUTH AND THROAT WITH OR WITHOUT PRESENCE OF ULCERS (sore throat, sores in mouth, or a toothache) UNUSUAL RASH, SWELLING OR PAIN  UNUSUAL VAGINAL DISCHARGE OR ITCHING   Items with * indicate a potential emergency and should be followed up as soon as possible or go to the Emergency Department if any problems should occur.  Please show the CHEMOTHERAPY ALERT CARD or IMMUNOTHERAPY ALERT CARD at check-in to  the Emergency Department and triage nurse.  Should you have questions after your visit or need to cancel or reschedule your appointment, please contact [facility]  Dept: [phone]  and follow the prompts.  Office hours are 8:00 a.m. to 4:30 p.m. Monday - Friday. Please note that voicemails left after 4:00 p.m. may not be returned until the following business day.  We are closed weekends and major holidays. You have access to a nurse at all times for urgent questions. Please call the main number to the clinic Dept: [phone] and follow the prompts.   For any non-urgent questions, you may also contact your provider using MyChart. We now offer e-Visits for anyone 18 and older to request care online for non-urgent symptoms. For details visit [email or web address].   Also download the MyChart app! Go to the app store, search "MyChart", open the app, select [facility], and log in with your MyChart username and password.  Masks are optional in the cancer centers. If you would like for your care team to wear a mask while they are taking care of you, please let them know. You may have one support person who is at least 34 years old accompany you for your appointments. 

## 2021-09-30 NOTE — Progress Notes (Signed)
[facility] Follow up:    Evelyn Marshall, [name]   DIAGNOSIS:  Cancer Staging  Malignant neoplasm of upper-outer quadrant of right breast in female, estrogen receptor positive ([place]) Staging form: Breast, AJCC 8th Edition - Clinical: Stage IV (cT3, cN2, pM1, G2, ER+, PR-, HER2+) - Signed by Nicholas Lose, MD on 12/15/2020 Histologic grading system: 3 grade system - Pathologic: No stage assigned - Unsigned   SUMMARY OF ONCOLOGIC HISTORY: Oncology History  Malignant neoplasm of upper-outer quadrant of right breast in female, estrogen receptor positive (Evelyn Marshall)  12/08/2020 Initial Diagnosis   21-week pregnancy: MRI at [facility] noncontrast revealed right axillary mass 3.7 cm, subpectoral lymph nodes, left supraclavicular lymph node, multiple level 2 and 3 lymph nodes in the right axilla, 2 breast masses 1.1 cm largest size.  Mammogram in [place] revealed 3 small masses at 12:00 1.2 cm, 0.6 cm, 0.9 cm and 5 abnormal axillary lymph nodes, pleomorphic calcifications measuring 10 cm, biopsy revealed grade 2 IDC with necrosis and calcifications ER 30%, PR 0%, HER2 positive, Ki-67 30 to 40%   12/15/2020 Cancer Staging   Staging form: Breast, AJCC 8th Edition - Clinical: Stage IV (cT3, cN2, pM1, G2, ER+, PR-, HER2+) - Signed by Nicholas Lose, MD on 12/15/2020 Histologic grading system: 3 grade system   12/24/2020 - 02/23/2021 Chemotherapy   Patient is on Treatment Plan : BREAST Adjuvant AC q21d     12/29/2020 Genetic Testing   Negative hereditary cancer genetic testing: no pathogenic variants detected in [facility] BRCAPlus Panel or [facility] CustomNext-Cancer +RNAinsight Panel.  The report dates are 12/23/2020 and 12/29/2020.   The BRCAplus panel offered by [employer] and includes sequencing and deletion/duplication analysis for the following 8 genes: ATM, BRCA1, BRCA2, CDH1, CHEK2, PALB2, PTEN, and TP53.  The CustomNext-Cancer+RNAinsight panel offered by  [facility] includes sequencing and rearrangement analysis for the following 47 genes:  APC, ATM, AXIN2, BARD1, BMPR1A, BRCA1, BRCA2, BRIP1, CDH1, CDK4, CDKN2A, CHEK2, DICER1, EPCAM, GREM1, HOXB13, MEN1, MLH1, MSH2, MSH3, MSH6, MUTYH, NBN, NF1, NF2, NTHL1, PALB2, PMS2, POLD1, POLE, PTEN, RAD51C, RAD51D, RECQL, RET, SDHA, SDHAF2, SDHB, SDHC, SDHD, SMAD4, SMARCA4, STK11, TP53, TSC1, TSC2, and VHL.  RNA data is routinely analyzed for use in variant interpretation for all genes.   03/25/2021 - 06/30/2021 Chemotherapy   Patient is on Treatment Plan : BREAST AC q21 days / Trastuzumab + Pertuzumab + Weekly Paclitaxel q21d     07/29/2021 - 09/09/2021 Chemotherapy   Patient is on Treatment Plan : BREAST ADO-Trastuzumab Emtansine (Kadcyla) q21d     07/29/2021 -  Chemotherapy   Patient is on Treatment Plan : BREAST ADO-Trastuzumab Emtansine (Kadcyla) q21d       CURRENT THERAPY: Kadcyla  INTERVAL HISTORY: Evelyn Marshall 36 y.o. female returns for follow-up prior to receiving Kadcyla therapy.  Her most recent echocardiogram was completed on June 23, 2021 and showed a left ventricular ejection fraction of 55 to 60%.  She has repeat echo scheduled on October 04, 2021.  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  Is now on Kadcyla alone and wants to know more about the antiestrogen therapy that is planned for her.  Patient Active Problem List   Diagnosis Date Noted   S/P mastectomy, right 07/05/2021   Postpartum care following vaginal delivery 03/16/2021   Malignant neoplasm affecting pregnancy in third trimester 03/15/2021  Malignant neoplasm affecting pregnancy, antepartum, third trimester 03/13/2021   Port-A-Cath in place 12/31/2020   Genetic testing 12/27/2020   Malignant neoplasm of upper-outer quadrant of right breast in female, estrogen receptor positive ([place]) 12/15/2020   Family  history of breast cancer 12/15/2020   Severe preeclampsia, third trimester 02/25/2019   Hyperemesis gravidarum 08/20/2018   Preeclampsia, third trimester 11/15/2015   Pre-eclampsia during pregnancy in third trimester, antepartum 11/15/2015   Preeclampsia 11/15/2015   Mild preeclampsia 11/12/2015   Hypokalemia, inadequate intake 06/17/2015   Ptyalism 05/18/2015   Chronic hypertension in pregnancy 05/18/2015   Morbid obesity with BMI of 40.0-44.9, adult ([place]) 05/18/2015   Rh negative, maternal 05/18/2015    has No Known Allergies.  MEDICAL HISTORY: Past Medical History:  Diagnosis Date   Abnormal Pap smear 2012   colpo result benign   Cancer ([place])    Chlamydia    Family history of breast cancer 12/15/2020   History of pre-eclampsia    HPV (human papilloma virus) infection    Hyperemesis    Hypokalemia    Pregnancy induced hypertension    Ptyalism    Vaginal Pap smear, abnormal     SURGICAL HISTORY: Past Surgical History:  Procedure Laterality Date   BREAST REDUCTION SURGERY Left 07/05/2021   Procedure: LEFT MAMMARY REDUCTION  (BREAST);  Surgeon: Irene Limbo, MD;  Location: [facility];  Service: Plastics;  Laterality: Left;   COLPOSCOPY     IR IMAGING GUIDED PORT INSERTION  12/24/2020   NODE DISSECTION Right 07/05/2021   Procedure: RIGHT AXILLARY NODE DISSECTION;  Surgeon: Rolm Bookbinder, MD;  Location: [facility];  Service: General;  Laterality: Right;   SIMPLE MASTECTOMY WITH AXILLARY SENTINEL NODE BIOPSY Right 07/05/2021   Procedure: RIGHT MASTECTOMY;  Surgeon: Rolm Bookbinder, MD;  Location: [facility];  Service: General;  Laterality: Right;  GEN & PEC BLOCK   WISDOM TOOTH EXTRACTION      SOCIAL HISTORY: Social History   Socioeconomic History   Marital status: Married    Spouse name: Evelyn Marshall   Number of children: 3   Years of education: 16   Highest education level: Bachelor's degree (e.g., BA, AB, BS)   Occupational History   Occupation: Pharmacist, hospital  Tobacco Use   Smoking status: Never   Smokeless tobacco: Never  Vaping Use   Vaping Use: Never used  Substance and Sexual Activity   Alcohol use: No   Drug use: No   Sexual activity: Yes  Other Topics Concern   Not on file  Social History Narrative   Not on file   Social Determinants of Health   Financial Resource Strain: Not on file  Food Insecurity: Not on file  Transportation Needs: Not on file  Physical Activity: Not on file  Stress: Not on file  Social Connections: Not on file  Intimate Partner Violence: Not on file    FAMILY HISTORY: Family History  Problem Relation Age of Onset   Heart disease Father    Hypertension Father    Stroke Father    Breast cancer Paternal Grandmother        dx 44s   Stroke Paternal Grandmother    Breast cancer Cousin 21       maternal female cousin    Review of Systems  Constitutional:  Positive for fatigue. Negative for appetite change, chills, fever and unexpected weight change.  HENT:   Negative for hearing loss, lump/mass and trouble swallowing.   Eyes:  Negative for  eye problems and icterus.  Respiratory:  Negative for chest tightness, cough and shortness of breath.   Cardiovascular:  Negative for chest pain, leg swelling and palpitations.  Gastrointestinal:  Negative for abdominal distention, abdominal pain, constipation, diarrhea, nausea and vomiting.  Endocrine: Negative for hot flashes.  Genitourinary:  Negative for difficulty urinating.   Musculoskeletal:  Negative for arthralgias.  Skin:  Positive for wound (Per interval history). Negative for itching and rash.  Neurological:  Negative for dizziness, extremity weakness, headaches and numbness.  Hematological:  Negative for adenopathy. Does not bruise/bleed easily.  Psychiatric/Behavioral:  Negative for depression. The patient is not nervous/anxious.       PHYSICAL EXAMINATION  ECOG PERFORMANCE STATUS: 1 - Symptomatic  but completely ambulatory  Vitals:   09/30/21 1016  BP: (!) 146/90  Pulse: 72  Resp: 16  Temp: 97.9 F (36.6 C)  SpO2: 99%    Physical Exam Constitutional:      General: She is not in acute distress.    Appearance: Normal appearance. She is not toxic-appearing.  HENT:     Head: Normocephalic and atraumatic.  Eyes:     General: No scleral icterus. Cardiovascular:     Rate and Rhythm: Normal rate and regular rhythm.     Pulses: Normal pulses.     Heart sounds: Normal heart sounds.  Pulmonary:     Effort: Pulmonary effort is normal.     Breath sounds: Normal breath sounds.  Chest:     Comments: Right breast status postmastectomy with some skin peeling that is noted in the mastectomy scar right axilla and upper chest wall. Abdominal:     General: Abdomen is flat. Bowel sounds are normal. There is no distension.     Palpations: Abdomen is soft.     Tenderness: There is no abdominal tenderness.  Musculoskeletal:        General: No swelling.     Cervical back: Neck supple.  Lymphadenopathy:     Cervical: No cervical adenopathy.  Skin:    General: Skin is warm and dry.     Findings: No rash.  Neurological:     General: No focal deficit present.     Mental Status: She is alert.  Psychiatric:        Mood and Affect: Mood normal.        Behavior: Behavior normal.     LABORATORY DATA:  CBC    Component Value Date/Time   WBC 4.5 09/30/2021 0941   WBC 3.5 (L) 09/09/2021 1015   RBC 3.86 (L) 09/30/2021 0941   HGB 9.9 (L) 09/30/2021 0941   HCT 30.2 (L) 09/30/2021 0941   PLT 233 09/30/2021 0941   MCV 78.2 (L) 09/30/2021 0941   MCH 25.6 (L) 09/30/2021 0941   MCHC 32.8 09/30/2021 0941   RDW 17.1 (H) 09/30/2021 0941   LYMPHSABS 0.4 (L) 09/30/2021 0941   MONOABS 0.6 09/30/2021 0941   EOSABS 0.1 09/30/2021 0941   BASOSABS 0.0 09/30/2021 0941    CMP     Component Value Date/Time   NA 137 09/30/2021 0941   K 3.4 (L) 09/30/2021 0941   CL 104 09/30/2021 0941   CO2 27  09/30/2021 0941   GLUCOSE 98 09/30/2021 0941   BUN 7 09/30/2021 0941   CREATININE 0.59 09/30/2021 0941   CALCIUM 9.4 09/30/2021 0941   PROT 7.4 09/30/2021 0941   ALBUMIN 3.6 09/30/2021 0941   AST 23 09/30/2021 0941   ALT 15 09/30/2021 0941   ALKPHOS 98 09/30/2021  0941   BILITOT 0.3 09/30/2021 0941   GFRNONAA >60 09/30/2021 0941   GFRAA >60 02/28/2019 1700       ASSESSMENT and THERAPY PLAN:   Malignant neoplasm of upper-outer quadrant of right breast in female, estrogen receptor positive ([place]) Evelyn Marshall is a 36 year old woman with stage IV breast cancer here today for follow-up and evaluation prior to receiving Kadcyla treatment.  She is here for #3 of Kadcyla and her labs are stable I reviewed those with her in detail.  We discussed her slightly low potassium and hemoglobin and I recommended iron rich foods along with potassium rich foods.  She will undergo echo on September 12 and we reviewed this in detail.  She finished radiation early and her skin areas are healing well.  I reviewed Dr. Geralyn Flash plan with her which includes antiestrogen therapy with anastrozole or letrozole along with Zoladex.  See her back in 3 weeks for labs, follow-up, and her next Kadcyla treatment.   All questions were answered. The patient knows to call the clinic with any problems, questions or concerns. We can certainly see the patient much sooner if necessary.  Total encounter time:20 minutes*in face-to-face visit time, chart review, lab review, care coordination, order entry, and documentation of the encounter time.   Wilber Bihari, NP 09/30/21 11:17 AM Medical Oncology and Hematology [facility] [place], [place] [address] Tel. [phone]    Fax. [phone]  *Total Encounter Time as defined by the Centers for Medicare and Medicaid Services includes, in addition to the face-to-face time of a patient visit (documented in the note above) non-face-to-face time:  obtaining and reviewing outside history, ordering and reviewing medications, tests or procedures, care coordination (communications with other health care professionals or caregivers) and documentation in the medical record.

## 2021-10-02 ENCOUNTER — Encounter: Payer: Self-pay | Admitting: Physical Therapy

## 2021-10-03 ENCOUNTER — Ambulatory Visit: Payer: BC Managed Care – PPO

## 2021-10-04 ENCOUNTER — Ambulatory Visit: Payer: BC Managed Care – PPO

## 2021-10-04 ENCOUNTER — Ambulatory Visit (HOSPITAL_COMMUNITY): Payer: BC Managed Care – PPO | Attending: Internal Medicine

## 2021-10-04 DIAGNOSIS — I517 Cardiomegaly: Secondary | ICD-10-CM

## 2021-10-04 DIAGNOSIS — Z170 Estrogen receptor positive status [ER+]: Secondary | ICD-10-CM

## 2021-10-04 DIAGNOSIS — Z79899 Other long term (current) drug therapy: Secondary | ICD-10-CM | POA: Insufficient documentation

## 2021-10-04 DIAGNOSIS — I5030 Unspecified diastolic (congestive) heart failure: Secondary | ICD-10-CM | POA: Diagnosis not present

## 2021-10-04 DIAGNOSIS — I081 Rheumatic disorders of both mitral and tricuspid valves: Secondary | ICD-10-CM

## 2021-10-04 DIAGNOSIS — I080 Rheumatic disorders of both mitral and aortic valves: Secondary | ICD-10-CM | POA: Diagnosis not present

## 2021-10-04 DIAGNOSIS — Z5181 Encounter for therapeutic drug level monitoring: Secondary | ICD-10-CM | POA: Insufficient documentation

## 2021-10-04 DIAGNOSIS — Z0189 Encounter for other specified special examinations: Secondary | ICD-10-CM

## 2021-10-04 DIAGNOSIS — C50411 Malignant neoplasm of upper-outer quadrant of right female breast: Secondary | ICD-10-CM

## 2021-10-04 LAB — ECHOCARDIOGRAM LIMITED
Area-P 1/2: 2.33 cm2
S' Lateral: 3.2 cm

## 2021-10-05 ENCOUNTER — Other Ambulatory Visit: Payer: Self-pay

## 2021-10-05 DIAGNOSIS — Z79899 Other long term (current) drug therapy: Secondary | ICD-10-CM

## 2021-10-06 ENCOUNTER — Ambulatory Visit: Payer: BC Managed Care – PPO | Attending: General Surgery

## 2021-10-06 VITALS — Wt 260.0 lb

## 2021-10-06 DIAGNOSIS — Z483 Aftercare following surgery for neoplasm: Secondary | ICD-10-CM | POA: Insufficient documentation

## 2021-10-06 NOTE — Therapy (Addendum)
OUTPATIENT PHYSICAL THERAPY SOZO SCREENING NOTE   Patient Name: Evelyn Marshall MRN: [id number] DOB:10/24/1985, 36 y.o., female Today's Date: 10/06/2021  PCP: [name], MD REFERRING PROVIDER: Rolm Bookbinder, MD   PT End of Session - 10/06/21 1006     Visit Number 2   # unchanged due to screen only   PT Start Time 34    PT Stop Time 1010    PT Time Calculation (min) 8 min    Activity Tolerance Patient tolerated treatment well    Behavior During Therapy [facility] for tasks assessed/performed             Past Medical History:  Diagnosis Date   Abnormal Pap smear 2012   colpo result benign   Cancer ([place])    Chlamydia    Family history of breast cancer 12/15/2020   History of pre-eclampsia    HPV (human papilloma virus) infection    Hyperemesis    Hypokalemia    Pregnancy induced hypertension    Ptyalism    Vaginal Pap smear, abnormal    Past Surgical History:  Procedure Laterality Date   BREAST REDUCTION SURGERY Left 07/05/2021   Procedure: LEFT MAMMARY REDUCTION  (BREAST);  Surgeon: Irene Limbo, MD;  Location: [facility];  Service: Plastics;  Laterality: Left;   COLPOSCOPY     IR IMAGING GUIDED PORT INSERTION  12/24/2020   NODE DISSECTION Right 07/05/2021   Procedure: RIGHT AXILLARY NODE DISSECTION;  Surgeon: Rolm Bookbinder, MD;  Location: [facility];  Service: General;  Laterality: Right;   SIMPLE MASTECTOMY WITH AXILLARY SENTINEL NODE BIOPSY Right 07/05/2021   Procedure: RIGHT MASTECTOMY;  Surgeon: Rolm Bookbinder, MD;  Location: [facility];  Service: General;  Laterality: Right;  [place] EXTRACTION     Patient Active Problem List   Diagnosis Date Noted   S/P mastectomy, right 07/05/2021   Postpartum care following vaginal delivery 03/16/2021   Malignant neoplasm affecting pregnancy in third trimester 03/15/2021   Malignant neoplasm affecting pregnancy, antepartum, third trimester  03/13/2021   Port-A-Cath in place 12/31/2020   Genetic testing 12/27/2020   Malignant neoplasm of upper-outer quadrant of right breast in female, estrogen receptor positive ([place]) 12/15/2020   Family history of breast cancer 12/15/2020   Severe preeclampsia, third trimester 02/25/2019   Hyperemesis gravidarum 08/20/2018   Preeclampsia, third trimester 11/15/2015   Pre-eclampsia during pregnancy in third trimester, antepartum 11/15/2015   Preeclampsia 11/15/2015   Mild preeclampsia 11/12/2015   Hypokalemia, inadequate intake 06/17/2015   Ptyalism 05/18/2015   Chronic hypertension in pregnancy 05/18/2015   Morbid obesity with BMI of 40.0-44.9, adult ([place]) 05/18/2015   Rh negative, maternal 05/18/2015    REFERRING DIAG: right breast cancer at risk for lymphedema  THERAPY DIAG: Aftercare following surgery for neoplasm  PERTINENT HISTORY: She is now post op Right mastectomy on 07/05/2021 with a left Breast reduction. 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 This was a grade 3. The largest remaining tumor was 8 mm and it was multifocal. There were 24 lymph nodes all removed  with an axillary lymph node dissection. 5 of these had macrometastases present in them. This was a less than 1% ER positive, PR negative, HER2 positive tumor. She is presently undergoing radiation and will have 31 treatments  PRECAUTIONS: right UE Lymphedema risk, None  SUBJECTIVE: Pt returns for her first 3 month L-Dex screen.  PAIN:  Are you having pain? No  SOZO SCREENING: Patient was assessed today using the SOZO machine to determine the lymphedema index score. This was compared to her baseline score. It was determined that she is within the recommended range when compared to her baseline and no further action is needed at this time. She will continue SOZO screenings. These are done every 3 months for 2 years post operatively  followed by every 6 months for 2 years, and then annually.  Also answered pts questions regarding when she should wear her compression sleeve and for how long.    L-DEX FLOWSHEETS - 10/06/21 1000       L-DEX LYMPHEDEMA SCREENING   Measurement Type Unilateral    L-DEX MEASUREMENT EXTREMITY Upper Extremity    POSITION  Standing    DOMINANT SIDE Right    At Risk Side Right    BASELINE SCORE (UNILATERAL) 4.2    L-DEX SCORE (UNILATERAL) 2.4    VALUE CHANGE (UNILAT) -1.8              Otelia Limes, PTA 10/06/2021, 10:13 AM

## 2021-10-07 ENCOUNTER — Other Ambulatory Visit: Payer: Self-pay

## 2021-10-17 NOTE — Progress Notes (Signed)
Patient Care Team: [name], MD as PCP - General (Family Medicine) Janina Mayo, MD as PCP - Cardiology (Cardiology) Nicholas Lose, MD as Consulting Physician (Hematology and Oncology) Servando Salina, MD as Consulting Physician (Obstetrics and Gynecology) Irene Limbo, MD as Consulting Physician (Plastic Surgery) Rolm Bookbinder, MD as Consulting Physician (General Surgery)  DIAGNOSIS:  Encounter Diagnoses  Name Primary?   Malignant neoplasm of upper-outer quadrant of right breast in female, estrogen receptor positive ([place])    Microcytic anemia Yes    SUMMARY OF ONCOLOGIC HISTORY: Oncology History  Malignant neoplasm of upper-outer quadrant of right breast in female, estrogen receptor positive ([place])  12/08/2020 Initial Diagnosis   21-week pregnancy: MRI at [facility] noncontrast revealed right axillary mass 3.7 cm, subpectoral lymph nodes, left supraclavicular lymph node, multiple level 2 and 3 lymph nodes in the right axilla, 2 breast masses 1.1 cm largest size.  Mammogram in [place] revealed 3 small masses at 12:00 1.2 cm, 0.6 cm, 0.9 cm and 5 abnormal axillary lymph nodes, pleomorphic calcifications measuring 10 cm, biopsy revealed grade 2 IDC with necrosis and calcifications ER 30%, PR 0%, HER2 positive, Ki-67 30 to 40%   12/15/2020 Cancer Staging   Staging form: Breast, AJCC 8th Edition - Clinical: Stage IV (cT3, cN2, pM1, G2, ER+, PR-, HER2+) - Signed by Nicholas Lose, MD on 12/15/2020 Histologic grading system: 3 grade system   12/24/2020 - 02/23/2021 Chemotherapy   Patient is on Treatment Plan : BREAST Adjuvant AC q21d     12/29/2020 Genetic Testing   Negative hereditary cancer genetic testing: no pathogenic variants detected in [facility] BRCAPlus Panel or [facility] CustomNext-Cancer +RNAinsight Panel.  The report dates are 12/23/2020 and 12/29/2020.   The BRCAplus panel offered by [employer] and includes sequencing and deletion/duplication analysis for the following  8 genes: ATM, BRCA1, BRCA2, CDH1, CHEK2, PALB2, PTEN, and TP53.  The CustomNext-Cancer+RNAinsight panel offered by [facility] includes sequencing and rearrangement analysis for the following 47 genes:  APC, ATM, AXIN2, BARD1, BMPR1A, BRCA1, BRCA2, BRIP1, CDH1, CDK4, CDKN2A, CHEK2, DICER1, EPCAM, GREM1, HOXB13, MEN1, MLH1, MSH2, MSH3, MSH6, MUTYH, NBN, NF1, NF2, NTHL1, PALB2, PMS2, POLD1, POLE, PTEN, RAD51C, RAD51D, RECQL, RET, SDHA, SDHAF2, SDHB, SDHC, SDHD, SMAD4, SMARCA4, STK11, TP53, TSC1, TSC2, and VHL.  RNA data is routinely analyzed for use in variant interpretation for all genes.   03/25/2021 - 06/30/2021 Chemotherapy   Patient is on Treatment Plan : BREAST AC q21 days / Trastuzumab + Pertuzumab + Weekly Paclitaxel q21d     07/29/2021 - 09/09/2021 Chemotherapy   Patient is on Treatment Plan : BREAST ADO-Trastuzumab Emtansine (Kadcyla) q21d     07/29/2021 -  Chemotherapy   Patient is on Treatment Plan : BREAST ADO-Trastuzumab Emtansine (Kadcyla) q21d       CHIEF COMPLIANT: Follow-up Kadcyla cycle 5  INTERVAL HISTORY: Evelyn Marshall is a 36 year old above-mentioned history of breast cancer who is HER2 positive and she previously received Taxol Herceptin Perjeta. She presents to the clinic today for a follow-up. She reports that she is tolerating the Kadcyla. Denies nausea but does have some tingling in finger tips and feet. She had 3 random cycle since the end of July.   ALLERGIES:  has No Known Allergies.  MEDICATIONS:  Current Outpatient Medications  Medication Sig Dispense Refill   anastrozole (ARIMIDEX) 1 MG tablet Take 1 tablet (1 mg total) by mouth daily. 90 tablet 3   carvedilol (COREG) 25 MG tablet Take 1 tablet (25 mg total) by mouth 2 (two)  times daily. 180 tablet 3   Iron-FA-B Cmp-C-Biot-Probiotic (FUSION PLUS) CAPS Take 1 capsule by mouth daily.     lidocaine-prilocaine (EMLA) cream Apply 1 application. topically as needed (apply once weekly prior to port a cath needle  insertion.). 30 g 0   losartan (COZAAR) 25 MG tablet Take 2 tablets (50 mg total) by mouth daily. 180 tablet 1   ondansetron (ZOFRAN) 8 MG tablet Take 1 tablet (8 mg total) by mouth every 8 (eight) hours as needed for nausea or vomiting. 20 tablet 1   No current facility-administered medications for this visit.    PHYSICAL EXAMINATION: ECOG PERFORMANCE STATUS: 1 - Symptomatic but completely ambulatory  Vitals:   10/21/21 0955  BP: (!) 156/95  Pulse: 66  Resp: 14  Temp: (!) 97.3 F (36.3 C)  SpO2: 100%   Filed Weights   10/21/21 0955  Weight: 255 lb 9.6 oz (115.9 kg)      LABORATORY DATA:  I have reviewed the data as listed    Latest Ref Rng & Units 10/21/2021    9:36 AM 09/30/2021    9:41 AM 09/09/2021   10:15 AM  CMP  Glucose 70 - 99 mg/dL 98  98  117   BUN 6 - 20 mg/dL _0 Creatinine 0.44 - 1.00 mg/dL 0.60  0.59  0.70   Sodium 135 - 145 mmol/L 136  137  138   Potassium 3.5 - 5.1 mmol/L 3.7  3.4  3.4   Chloride 98 - 111 mmol/L 103  104  104   CO2 22 - 32 mmol/L _1 Calcium 8.9 - 10.3 mg/dL 9.5  9.4  9.4   Total Protein 6.5 - 8.1 g/dL 8.0  7.4  7.4   Total Bilirubin 0.3 - 1.2 mg/dL 0.4  0.3  0.4   Alkaline Phos 38 - 126 U/L 113  98  101   AST 15 - 41 U/L 38  23  30   ALT 0 - 44 U/L _2 Lab Results  Component Value Date   WBC 3.7 (L) 10/21/2021   HGB 10.4 (L) 10/21/2021   HCT 31.9 (L) 10/21/2021   MCV 76.5 (L) 10/21/2021   PLT 234 10/21/2021   NEUTROABS 2.2 10/21/2021    ASSESSMENT & PLAN:  Malignant neoplasm of upper-outer quadrant of right breast in female, estrogen receptor positive ([place]) 12/08/2020:21-week pregnancy: MRI at [facility] noncontrast revealed right axillary mass 3.7 cm, subpectoral lymph nodes, left supraclavicular lymph node, multiple level 2 and 3 lymph nodes in the right axilla, 2 breast masses 1.1 cm largest size.  Mammogram in [place] revealed 3 small masses at 12:00 1.2 cm, 0.6 cm, 0.9 cm and 5 abnormal axillary lymph  nodes, pleomorphic calcifications measuring 10 cm, biopsy revealed grade 2 IDC with necrosis and calcifications ER 30%, PR 0%, HER2 positive, Ki-67 30 to 40%   PET/CT 03/24/2021: Multifocal right breast cancer, hypermetabolic right axillary, subpectoral, bilateral supraclavicular and mediastinal lymph nodes   Treatment plan: 1.  Neoadjuvant chemotherapy with Adriamycin and Cytoxan every 3 weeks x4 started 12/24/2020 completed 02/23/2021 2. delivery of the baby 3.  Continue neoadjuvant therapy with Taxol Herceptin and Perjeta (Taxol completed 06/09/21) 4.  Mastectomy with ALND:  07/05/21: 8 mm residual HG IDC with LVI, Margins Neg 4.  Kadcyla started 07/29/2021 5.  Adjuvant radiation therapy (radiation oncology is not certain of benefits of radiation given PET/CT findings)  6.  Adjuvant antiestrogen therapy with ovarian function suppression Genetic testing ---------------------------------------------------------------------------------------------------------------------------------- Current treatment: Kadcyla started 07/29/2021, today is cycle 5   Kadcyla toxicities:  mild fatigue Anemia: Microcytic: With normal iron studies.  We will send hemoglobin electrophoresis.  Monitoring closely for toxicities   08/09/2021: PET CT scan: Right mastectomy and right axillary lymph node dissection: Postoperative changes and seroma.  No convincing evidence of distant metastatic disease.   Radiation: Started 08/23/2021 ending 10/03/2021 Return to clinic every 3 weeks for Kadcyla treatment.  I recommended starting the patient on Zoladex with anastrozole for complete estrogen blockade.  Patient is up for it.  We will start the Zoladex injections next week.    Orders Placed This Encounter  Procedures   Hgb Fractionation Cascade    Standing Status:   Future    Standing Expiration Date:   10/22/2022   The patient has a good understanding of the overall plan. she agrees with it. she will call with any problems that  may develop before the next visit here. Total time spent: 30 mins including face to face time and time spent for planning, charting and co-ordination of care   Harriette Ohara, MD 10/21/21    I Gardiner Coins am scribing for Dr. Lindi Adie  I have reviewed the above documentation for accuracy and completeness, and I agree with the above.

## 2021-10-21 ENCOUNTER — Inpatient Hospital Stay: Payer: BC Managed Care – PPO

## 2021-10-21 ENCOUNTER — Inpatient Hospital Stay (HOSPITAL_BASED_OUTPATIENT_CLINIC_OR_DEPARTMENT_OTHER): Payer: BC Managed Care – PPO | Admitting: Hematology and Oncology

## 2021-10-21 VITALS — BP 135/79 | HR 67 | Temp 98.1°F | Resp 18

## 2021-10-21 VITALS — BP 156/95 | HR 66 | Temp 97.3°F | Resp 14 | Ht 64.0 in | Wt 255.6 lb

## 2021-10-21 DIAGNOSIS — Z5112 Encounter for antineoplastic immunotherapy: Secondary | ICD-10-CM | POA: Diagnosis not present

## 2021-10-21 DIAGNOSIS — D509 Iron deficiency anemia, unspecified: Secondary | ICD-10-CM | POA: Diagnosis not present

## 2021-10-21 DIAGNOSIS — Z170 Estrogen receptor positive status [ER+]: Secondary | ICD-10-CM

## 2021-10-21 DIAGNOSIS — Z95828 Presence of other vascular implants and grafts: Secondary | ICD-10-CM

## 2021-10-21 DIAGNOSIS — C50411 Malignant neoplasm of upper-outer quadrant of right female breast: Secondary | ICD-10-CM | POA: Diagnosis not present

## 2021-10-21 LAB — CMP (CANCER CENTER ONLY)
ALT: 26 U/L (ref 0–44)
AST: 38 U/L (ref 15–41)
Albumin: 3.8 g/dL (ref 3.5–5.0)
Alkaline Phosphatase: 113 U/L (ref 38–126)
Anion gap: 5 (ref 5–15)
BUN: 9 mg/dL (ref 6–20)
CO2: 28 mmol/L (ref 22–32)
Calcium: 9.5 mg/dL (ref 8.9–10.3)
Chloride: 103 mmol/L (ref 98–111)
Creatinine: 0.6 mg/dL (ref 0.44–1.00)
GFR, Estimated: >60 mL/min (ref >60)
Glucose, Bld: 98 mg/dL (ref 70–99)
Potassium: 3.7 mmol/L (ref 3.5–5.1)
Sodium: 136 mmol/L (ref 135–145)
Total Bilirubin: 0.4 mg/dL (ref 0.3–1.2)
Total Protein: 8 g/dL (ref 6.5–8.1)

## 2021-10-21 LAB — CBC WITH DIFFERENTIAL (CANCER CENTER ONLY)
Abs Immature Granulocytes: 0.01 10*3/uL (ref 0.00–0.07)
Basophils Absolute: 0 10*3/uL (ref 0.0–0.1)
Basophils Relative: 1 %
Eosinophils Absolute: 0.1 10*3/uL (ref 0.0–0.5)
Eosinophils Relative: 2 %
HCT: 31.9 % — ABNORMAL LOW (ref 36.0–46.0)
Hemoglobin: 10.4 g/dL — ABNORMAL LOW (ref 12.0–15.0)
Immature Granulocytes: 0 %
Lymphocytes Relative: 21 %
Lymphs Abs: 0.8 10*3/uL (ref 0.7–4.0)
MCH: 24.9 pg — ABNORMAL LOW (ref 26.0–34.0)
MCHC: 32.6 g/dL (ref 30.0–36.0)
MCV: 76.5 fL — ABNORMAL LOW (ref 80.0–100.0)
Monocytes Absolute: 0.7 10*3/uL (ref 0.1–1.0)
Monocytes Relative: 18 %
Neutro Abs: 2.2 10*3/uL (ref 1.7–7.7)
Neutrophils Relative %: 58 %
Platelet Count: 234 10*3/uL (ref 150–400)
RBC: 4.17 MIL/uL (ref 3.87–5.11)
RDW: 17.6 % — ABNORMAL HIGH (ref 11.5–15.5)
WBC Count: 3.7 10*3/uL — ABNORMAL LOW (ref 4.0–10.5)
nRBC: 0 % (ref 0.0–0.2)

## 2021-10-21 MED ORDER — SODIUM CHLORIDE 0.9% FLUSH
10.0000 mL | INTRAVENOUS | Status: DC | PRN
  Administered 2021-10-21: 10 mL

## 2021-10-21 MED ORDER — HEPARIN SOD (PORK) LOCK FLUSH 100 UNIT/ML IV SOLN
500.0000 [IU] | Freq: Once | INTRAVENOUS | Status: AC | PRN
  Administered 2021-10-21: 500 [IU]

## 2021-10-21 MED ORDER — ACETAMINOPHEN 325 MG PO TABS
650.0000 mg | ORAL_TABLET | Freq: Once | ORAL | Status: AC
  Administered 2021-10-21: 650 mg via ORAL
  Filled 2021-10-21: qty 2

## 2021-10-21 MED ORDER — DIPHENHYDRAMINE HCL 25 MG PO CAPS
50.0000 mg | ORAL_CAPSULE | Freq: Once | ORAL | Status: AC
  Administered 2021-10-21: 50 mg via ORAL
  Filled 2021-10-21: qty 2

## 2021-10-21 MED ORDER — SODIUM CHLORIDE 0.9 % IV SOLN
3.6000 mg/kg | Freq: Once | INTRAVENOUS | Status: AC
  Administered 2021-10-21: 420 mg via INTRAVENOUS
  Filled 2021-10-21: qty 16

## 2021-10-21 MED ORDER — PROCHLORPERAZINE EDISYLATE 10 MG/2ML IJ SOLN
10.0000 mg | Freq: Once | INTRAMUSCULAR | Status: AC
  Administered 2021-10-21: 10 mg via INTRAVENOUS
  Filled 2021-10-21: qty 2

## 2021-10-21 MED ORDER — PROCHLORPERAZINE MALEATE 10 MG PO TABS: 10.0000 mg | ORAL_TABLET | Freq: Once | ORAL | Status: DC

## 2021-10-21 MED ORDER — SODIUM CHLORIDE 0.9% FLUSH
10.0000 mL | Freq: Once | INTRAVENOUS | Status: AC
  Administered 2021-10-21: 10 mL

## 2021-10-21 MED ORDER — SODIUM CHLORIDE 0.9 % IV SOLN: Freq: Once | INTRAVENOUS | Status: AC

## 2021-10-21 MED ORDER — ANASTROZOLE 1 MG PO TABS: 1.0000 mg | ORAL_TABLET | Freq: Every day | ORAL | 3 refills | Status: DC

## 2021-10-21 NOTE — Patient Instructions (Signed)
[facility]COL[facility]  Discharge Instructions: Thank you for choosing [facility] to provide your oncology and hematology care.   If you have a lab appointment with the Cancer Center, please go directly to the Cancer Center and check in at the registration area.   Wear comfortable clothing and clothing appropriate for easy access to any Portacath or PICC line.   We strive to give you quality time with your provider. You may need to reschedule your appointment if you arrive late (15 or more minutes).  Arriving late affects you and other patients whose appointments are after yours.  Also, if you miss three or more appointments without notifying the office, you may be dismissed from the clinic at the provider's discretion.      For prescription refill requests, have your pharmacy contact our office and allow 72 hours for refills to be completed.    Today you received the following chemotherapy and/or immunotherapy agents : Kadcyla      To help prevent nausea and vomiting after your treatment, we encourage you to take your nausea medication as directed.  BELOW ARE SYMPTOMS THAT SHOULD BE REPORTED IMMEDIATELY: *FEVER GREATER THAN 100.4 F (38 C) OR HIGHER *CHILLS OR SWEATING *NAUSEA AND VOMITING THAT IS NOT CONTROLLED WITH YOUR NAUSEA MEDICATION *UNUSUAL SHORTNESS OF BREATH *UNUSUAL BRUISING OR BLEEDING *URINARY PROBLEMS (pain or burning when urinating, or frequent urination) *BOWEL PROBLEMS (unusual diarrhea, constipation, pain near the anus) TENDERNESS IN MOUTH AND THROAT WITH OR WITHOUT PRESENCE OF ULCERS (sore throat, sores in mouth, or a toothache) UNUSUAL RASH, SWELLING OR PAIN  UNUSUAL VAGINAL DISCHARGE OR ITCHING   Items with * indicate a potential emergency and should be followed up as soon as possible or go to the Emergency Department if any problems should occur.  Please show the CHEMOTHERAPY ALERT CARD or IMMUNOTHERAPY ALERT CARD at check-in to  the Emergency Department and triage nurse.  Should you have questions after your visit or need to cancel or reschedule your appointment, please contact [facility]  Dept: [phone]  and follow the prompts.  Office hours are 8:00 a.m. to 4:30 p.m. Monday - Friday. Please note that voicemails left after 4:00 p.m. may not be returned until the following business day.  We are closed weekends and major holidays. You have access to a nurse at all times for urgent questions. Please call the main number to the clinic Dept: [phone] and follow the prompts.   For any non-urgent questions, you may also contact your provider using MyChart. We now offer e-Visits for anyone 18 and older to request care online for non-urgent symptoms. For details visit [email or web address].   Also download the MyChart app! Go to the app store, search "MyChart", open the app, select [facility], and log in with your MyChart username and password.  Masks are optional in the cancer centers. If you would like for your care team to wear a mask while they are taking care of you, please let them know. You may have one support person who is at least 34 years old accompany you for your appointments. 

## 2021-10-21 NOTE — Assessment & Plan Note (Addendum)
12/08/2020:21-week pregnancy: MRI at [facility] noncontrast revealed right axillary mass 3.7 cm, subpectoral lymph nodes, left supraclavicular lymph node, multiple level 2 and 3 lymph nodes in the right axilla, 2 breast masses 1.1 cm largest size. 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 Neoadjuvant chemotherapy with Adriamycin and Cytoxan every 3 weeks x4started 12/24/2020 completed 02/23/2021 2. delivery of the baby 3. Continue neoadjuvant therapy with Taxol Herceptin and Perjeta(Taxol completed 06/09/21) 4.Mastectomy with ALND: 07/05/21: 8 mm residual HG IDC with LVI, Margins Neg 4.Kadcyla started 07/29/2021 5. Adjuvant radiation therapy(radiation oncology is not certain of benefits of radiation given PET/CT findings) 6. Adjuvant antiestrogen therapy with ovarian function suppression Genetic testing ---------------------------------------------------------------------------------------------------------------------------------- Current treatment: Kadcyla started 07/29/2021, today is cycle 5  Kadcyla toxicities:  mild fatigue Anemia: Microcytic: With normal iron studies.  We will send hemoglobin electrophoresis.  Monitoring closely for toxicities  08/09/2021: PET CT scan: Right mastectomy and right axillary lymph node dissection: Postoperative changes and seroma. No convincing evidence of distant metastatic disease.  Radiation: Started 08/23/2021 ending 10/03/2021 Return to clinic every 3 weeks for Kadcyla treatment.  I recommended starting the patient on Zoladex with anastrozole for complete estrogen  blockade.  Patient is up for it.  We will start the Zoladex injections next week.

## 2021-10-24 ENCOUNTER — Encounter: Payer: Self-pay | Admitting: *Deleted

## 2021-10-24 ENCOUNTER — Telehealth: Payer: Self-pay | Admitting: Hematology and Oncology

## 2021-10-24 NOTE — Telephone Encounter (Signed)
Scheduled appointment per 9/29 los. Patient is aware.

## 2021-10-28 ENCOUNTER — Ambulatory Visit
Admission: RE | Admit: 2021-10-28 | Discharge: 2021-10-28 | Disposition: A | Discharge status: Home / Self Care | Payer: BC Managed Care – PPO | Source: Ambulatory Visit | Attending: Radiation Oncology | Admitting: Radiation Oncology

## 2021-10-28 ENCOUNTER — Other Ambulatory Visit: Payer: Self-pay

## 2021-10-28 ENCOUNTER — Inpatient Hospital Stay: Payer: BC Managed Care – PPO | Attending: Hematology and Oncology

## 2021-10-28 ENCOUNTER — Encounter: Payer: Self-pay | Admitting: Radiation Oncology

## 2021-10-28 ENCOUNTER — Encounter: Payer: Self-pay | Admitting: Hematology and Oncology

## 2021-10-28 VITALS — BP 121/78 | HR 58 | Temp 97.5°F | Resp 18 | Ht 64.0 in | Wt 252.5 lb

## 2021-10-28 DIAGNOSIS — Z79899 Other long term (current) drug therapy: Secondary | ICD-10-CM | POA: Insufficient documentation

## 2021-10-28 DIAGNOSIS — D649 Anemia, unspecified: Secondary | ICD-10-CM | POA: Insufficient documentation

## 2021-10-28 DIAGNOSIS — Z170 Estrogen receptor positive status [ER+]: Secondary | ICD-10-CM | POA: Insufficient documentation

## 2021-10-28 DIAGNOSIS — Z5112 Encounter for antineoplastic immunotherapy: Secondary | ICD-10-CM | POA: Insufficient documentation

## 2021-10-28 DIAGNOSIS — Z95828 Presence of other vascular implants and grafts: Secondary | ICD-10-CM

## 2021-10-28 DIAGNOSIS — C50411 Malignant neoplasm of upper-outer quadrant of right female breast: Secondary | ICD-10-CM | POA: Diagnosis present

## 2021-10-28 DIAGNOSIS — Z79811 Long term (current) use of aromatase inhibitors: Secondary | ICD-10-CM | POA: Insufficient documentation

## 2021-10-28 MED ORDER — GOSERELIN ACETATE 3.6 MG ~~LOC~~ IMPL
3.6000 mg | DRUG_IMPLANT | Freq: Once | SUBCUTANEOUS | Status: AC
  Administered 2021-10-28: 3.6 mg via SUBCUTANEOUS
  Filled 2021-10-28: qty 3.6

## 2021-10-28 NOTE — Progress Notes (Signed)
END OF TREATMENT SUMMARY    Treatment Dates: 08-22-21 to 09-27-21.  Narrative: She tolerated treatment to the right chest wall, right breast regional nodes, and involved left chest/mediastinal nodes well, with moist desquamation in patches. Chest wall boost was canceled due to degree of moist desquamation.  Plan: Followup in 71mo -----------------------------------  SEppie Gibson MD

## 2021-10-28 NOTE — Patient Instructions (Addendum)
[facility]COL[facility]  Discharge Instructions: Thank you for choosing [facility] to provide your oncology and hematology care.   If you have a lab appointment with the [facility], please go directly to the [facility] and check in at the registration area.   Wear comfortable clothing and clothing appropriate for easy access to any Portacath or PICC line.   We strive to give you quality time with your provider. You may need to reschedule your appointment if you arrive late (15 or more minutes).  Arriving late affects you and other patients whose appointments are after yours.  Also, if you miss three or more appointments without notifying the office, you may be dismissed from the clinic at the provider's discretion.      For prescription refill requests, have your pharmacy contact our office and allow 72 hours for refills to be completed.    Today you received the following chemotherapy and/or immunotherapy agents Zoladex (goserelin)Goserelin Implant What is this medication? GOSERELIN (GOE se rel in) treats prostate cancer and breast cancer. It works by decreasing levels of the hormones testosterone and estrogen in the body. This prevents prostate and breast cancer cells from spreading or growing. It may also be used to treat endometriosis. This is a condition where the tissue that lines the uterus grows outside the uterus. It works by decreasing the amount of estrogen your body makes, which reduces heavy bleeding and pain. It can also be used to help thin the lining of the uterus before a surgery used to prevent or reduce heavy periods. This medicine may be used for other purposes; ask your health care provider or pharmacist if you have questions. COMMON BRAND NAME(S): Zoladex, Zoladex 43-Month What should I tell my care team before I take this medication? They need to know if you have any of these conditions: Bone problems Diabetes Heart disease History of  irregular heartbeat or rhythm An unusual or allergic reaction to goserelin, other medications, foods, dyes, or preservatives Pregnant or trying to get pregnant Breastfeeding How should I use this medication? This medication is injected under the skin. It is given by your care team in a hospital or clinic setting. Talk to your care team about the use of this medication in children. Special care may be needed. Overdosage: If you think you have taken too much of this medicine contact a poison control center or emergency room at once. NOTE: This medicine is only for you. Do not share this medicine with others. What if I miss a dose? Keep appointments for follow-up doses. It is important not to miss your dose. Call your care team if you are unable to keep an appointment. What may interact with this medication? Do not take this medication with any of the following: Cisapride Dronedarone Pimozide Thioridazine This medication may also interact with the following: Other medications that cause heart rhythm changes This list may not describe all possible interactions. Give your health care provider a list of all the medicines, herbs, non-prescription drugs, or dietary supplements you use. Also tell them if you smoke, drink alcohol, or use illegal drugs. Some items may interact with your medicine. What should I watch for while using this medication? Visit your care team for regular checks on your progress. Your symptoms may appear to get worse during the first weeks of this therapy. Tell your care team if your symptoms do not start to get better or if they get worse after this time. Using  this medication for a long time may weaken your bones. If you smoke or frequently drink alcohol you may increase your risk of bone loss. A family history of osteoporosis, chronic use of medications for seizures (convulsions), or corticosteroids can also increase your risk of bone loss. The risk of bone fractures may be  increased. Talk to your care team about your bone health. This medication may increase blood sugar. The risk may be higher in patients who already have diabetes. Ask your care team what you can do to lower your risk of diabetes while taking this medication. This medication should stop regular monthly menstruation in women. Tell your care team if you continue to menstruate. Talk to your care team if you wish to become pregnant or think you might be pregnant. This medication can cause serious birth defects if taken during pregnancy or for 12 weeks after stopping treatment. Talk to your care team about reliable forms of contraception. Do not breastfeed while taking this medication. This medication may cause infertility. Talk to your care team if you are concerned about your fertility. What side effects may I notice from receiving this medication? Side effects that you should report to your care team as soon as possible: Allergic reactions--skin rash, itching, hives, swelling of the face, lips, tongue, or throat Change in the amount of urine Heart attack--pain or tightness in the chest, shoulders, arms, or jaw, nausea, shortness of breath, cold or clammy skin, feeling faint or lightheaded Heart rhythm changes--fast or irregular heartbeat, dizziness, feeling faint or lightheaded, chest pain, trouble breathing High blood sugar (hyperglycemia)--increased thirst or amount of urine, unusual weakness or fatigue, blurry vision High calcium level--increased thirst or amount of urine, nausea, vomiting, confusion, unusual weakness or fatigue, bone pain Pain, redness, irritation, or bruising at the injection site Severe back pain, numbness or weakness of the hands, arms, legs, or feet, loss of coordination, loss of bowel or bladder control Stroke--sudden numbness or weakness of the face, arm, or leg, trouble speaking, confusion, trouble walking, loss of balance or coordination, dizziness, severe headache, change in  vision Swelling and pain of the tumor site or lymph nodes Trouble passing urine Side effects that usually do not require medical attention (report to your care team if they continue or are bothersome): Change in sex drive or performance Headache Hot flashes Rapid or extreme change in emotion or mood Sweating Swelling of the ankles, hands, or feet Unusual vaginal discharge, itching, or odor This list may not describe all possible side effects. Call your doctor for medical advice about side effects. You may report side effects to FDA at 1-800-FDA-1088. Where should I keep my medication? This medication is given in a hospital or clinic. It will not be stored at home. NOTE: This sheet is a summary. It may not cover all possible information. If you have questions about this medicine, talk to your doctor, pharmacist, or health care provider.  2023 Elsevier/Gold Standard (2021-05-25 00:00:00)      To help prevent nausea and vomiting after your treatment, we encourage you to take your nausea medication as directed.  BELOW ARE SYMPTOMS THAT SHOULD BE REPORTED IMMEDIATELY: *FEVER GREATER THAN 100.4 F (38 C) OR HIGHER *CHILLS OR SWEATING *NAUSEA AND VOMITING THAT IS NOT CONTROLLED WITH YOUR NAUSEA MEDICATION *UNUSUAL SHORTNESS OF BREATH *UNUSUAL BRUISING OR BLEEDING *URINARY PROBLEMS (pain or burning when urinating, or frequent urination) *BOWEL PROBLEMS (unusual diarrhea, constipation, pain near the anus) TENDERNESS IN MOUTH AND THROAT WITH OR WITHOUT PRESENCE OF ULCERS (  sore throat, sores in mouth, or a toothache) UNUSUAL RASH, SWELLING OR PAIN  UNUSUAL VAGINAL DISCHARGE OR ITCHING   Items with * indicate a potential emergency and should be followed up as soon as possible or go to the Emergency Department if any problems should occur.  Please show the CHEMOTHERAPY ALERT CARD or IMMUNOTHERAPY ALERT CARD at check-in to the Emergency Department and triage nurse.  Should you have questions  after your visit or need to cancel or reschedule your appointment, please contact [facility]  Dept: [phone]  and follow the prompts.  Office hours are 8:00 a.m. to 4:30 p.m. Monday - Friday. Please note that voicemails left after 4:00 p.m. may not be returned until the following business day.  We are closed weekends and major holidays. You have access to a nurse at all times for urgent questions. Please call the main number to the clinic Dept: [phone] and follow the prompts.   For any non-urgent questions, you may also contact your provider using MyChart. We now offer e-Visits for anyone 84 and older to request care online for non-urgent symptoms. For details visit [email or web address].   Also download the MyChart app! Go to the app store, search "MyChart", open the app, select , and log in with your MyChart username and password.  Masks are optional in the cancer centers. If you would like for your care team to wear a mask while they are taking care of you, please let them know. You may have one support person who is at least 34 years old accompany you for your appointments.

## 2021-10-28 NOTE — Progress Notes (Addendum)
Evelyn Marshall presents today for follow-up after completing radiation to her right breast on 09/27/2021  Pain: Denies Skin: Reports she had some peeling in treatment field after finishing radiation, but skin has since healed and is intact.  ROM: Denies any discomfort or limitations Lymphedema: Denies MedOnc F/U: Saw Dr. Lindi Adie on 10/21/2021 prior to her Kadcyla infusion, and will see him again on 11/11/2021 Other issues of note: Denies any lingering fatigue or tiredness. Overall reports she feels good and is  doing well  Pt reports Yes No Comments  Tamoxifen '[]'$  '[x]'$    Letrozole '[]'$  '[x]'$    Anastrazole '[x]'$  '[]'$    Mammogram '[]'$  Date: TBD '[]'$ 

## 2021-10-28 NOTE — Progress Notes (Signed)
Radiation Oncology         [phone] ________________________________  Name: Evelyn Marshall MRN: [id number]  Date: 10/28/2021  DOB: 1986/01/06  Follow-Up Visit Note  Outpatient  CC: [name], MD  [name], MD  Diagnosis and Prior Radiotherapy:    ICD-10-CM   1. Malignant neoplasm of upper-outer quadrant of right breast in female, estrogen receptor positive ([place])  C50.411    Z17.0      Cancer Staging  Malignant neoplasm of upper-outer quadrant of right breast in female, estrogen receptor positive ([place]) Staging form: Breast, AJCC 8th Edition - Clinical: Stage IV (cT3, cN2, pM1, G2, ER+, PR-, HER2+) - Signed by Nicholas Lose, MD on 12/15/2020 Histologic grading system: 3 grade system - Pathologic: No stage assigned - Unsigned    Treatment Dates: 08-22-21 to 09-27-21.  CHIEF COMPLAINT: Here for follow-up and surveillance of breast cancer  Narrative:  The patient returns today for routine follow-up.  Evelyn Marshall presents today for follow-up after completing radiation on 09/27/2021  Pain: Denies Skin: Reports she had some peeling in treatment field after finishing radiation, but skin has since healed and is intact.  ROM: Denies any discomfort or limitations Lymphedema: Denies MedOnc F/U: Saw Dr. Lindi Adie on 10/21/2021 prior to her Kadcyla infusion, and will see him again on 11/11/2021 Other issues of note: Denies any lingering fatigue or tiredness. Overall reports she feels good and is  doing well  Pt reports Yes No Comments  Tamoxifen '[]'  '[x]'    Letrozole '[]'  '[x]'    Anastrazole '[x]'  '[]'    Mammogram '[]'  Date: TBD '[]'                                  ALLERGIES:  has No Known Allergies.  Meds: Current Outpatient Medications  Medication Sig Dispense Refill   anastrozole (ARIMIDEX) 1 MG tablet Take 1 tablet (1 mg total) by mouth daily. 90 tablet 3   carvedilol (COREG) 25 MG tablet Take 1 tablet (25 mg total) by mouth 2 (two) times daily. 180 tablet 3   Iron-FA-B  Cmp-C-Biot-Probiotic (FUSION PLUS) CAPS Take 1 capsule by mouth daily.     lidocaine-prilocaine (EMLA) cream Apply 1 application. topically as needed (apply once weekly prior to port a cath needle insertion.). 30 g 0   losartan (COZAAR) 25 MG tablet Take 2 tablets (50 mg total) by mouth daily. 180 tablet 1   ondansetron (ZOFRAN) 8 MG tablet Take 1 tablet (8 mg total) by mouth every 8 (eight) hours as needed for nausea or vomiting. 20 tablet 1   No current facility-administered medications for this encounter.    Physical Findings: The patient is in no acute distress. Patient is alert and oriented.  height is '5\' 4"'  (1.626 m) and weight is 252 lb 8 oz (114.5 kg). Her oral temperature is 97.5 F (36.4 C) (abnormal). Her blood pressure is 121/78 and her pulse is 58 (abnormal). Her respiration is 18 and oxygen saturation is 100%. .    Satisfactory skin healing in radiotherapy fields. Skin is smooth, still hyperpigmented in areas over chest/ lower right neck   Lab Findings: Lab Results  Component Value Date   WBC 3.7 (L) 10/21/2021   HGB 10.4 (L) 10/21/2021   HCT 31.9 (L) 10/21/2021   MCV 76.5 (L) 10/21/2021   PLT 234 10/21/2021    Radiographic Findings: ECHOCARDIOGRAM LIMITED  Result Date: 10/04/2021    ECHOCARDIOGRAM LIMITED REPORT  Patient Name:   Evelyn Marshall Date of Exam: 10/04/2021 Medical Rec #:  [id number]     Height:       64.0 in Accession #:    [id number]    Weight:       263.7 lb Date of Birth:  January 21, 1986      BSA:          2.200 m Patient Age:    36 years      BP:           146/90 mmHg Patient Gender: F             HR:           69 bpm. Exam Location:  [address] Procedure: Limited Echo, Limited Color Doppler, Cardiac Doppler and Strain            Analysis Indications:    C50.411 Breast cancer  History:        Patient has prior history of Echocardiogram examinations, most                 recent 06/23/2021. Breast cancer, Signs/Symptoms:Chemo; Risk                  Factors:Hypertension.  Sonographer:    Coralyn Helling RDCS Referring Phys: [place]  1. Left ventricular ejection fraction, by estimation, is 55 to 60%. The left ventricle has normal function. The left ventricle has no regional wall motion abnormalities. There is mild left ventricular hypertrophy. Left ventricular diastolic parameters are consistent with Grade I diastolic dysfunction (impaired relaxation). The average left ventricular global longitudinal strain is -16.3 %. The global longitudinal strain is normal.  2. Right ventricular systolic function is normal. The right ventricular size is normal.  3. The mitral valve is normal in structure. Mild mitral valve regurgitation. No evidence of mitral stenosis.  4. The aortic valve is normal in structure. Aortic valve regurgitation is not visualized. No aortic stenosis is present.  5. The inferior vena cava is normal in size with greater than 50% respiratory variability, suggesting right atrial pressure of 3 mmHg. Comparison(s): No significant change from prior study. Prior images reviewed side by side. GLS: prior -16.0%. FINDINGS  Left Ventricle: Left ventricular ejection fraction, by estimation, is 55 to 60%. The left ventricle has normal function. The left ventricle has no regional wall motion abnormalities. The average left ventricular global longitudinal strain is -16.3 %. The global longitudinal strain is normal. The left ventricular internal cavity size was normal in size. There is mild left ventricular hypertrophy. Left ventricular diastolic parameters are consistent with Grade I diastolic dysfunction (impaired relaxation). Right Ventricle: The right ventricular size is normal. No increase in right ventricular wall thickness. Right ventricular systolic function is normal. Left Atrium: Left atrial size was normal in size. Right Atrium: Right atrial size was normal in size. Pericardium: There is no evidence of pericardial effusion.  Mitral Valve: The mitral valve is normal in structure. Mild mitral valve regurgitation. No evidence of mitral valve stenosis. Tricuspid Valve: The tricuspid valve is normal in structure. Tricuspid valve regurgitation is mild . No evidence of tricuspid stenosis. Aortic Valve: The aortic valve is normal in structure. Aortic valve regurgitation is not visualized. No aortic stenosis is present. Pulmonic Valve: The pulmonic valve was normal in structure. Pulmonic valve regurgitation is not visualized. No evidence of pulmonic stenosis. Aorta: The aortic root is normal in size and structure. Venous: The inferior vena cava is normal in  size with greater than 50% respiratory variability, suggesting right atrial pressure of 3 mmHg. IAS/Shunts: No atrial level shunt detected by color flow Doppler. LEFT VENTRICLE PLAX 2D LVIDd:         4.60 cm   Diastology LVIDs:         3.20 cm   LV e' medial:    7.29 cm/s LV PW:         1.10 cm   LV E/e' medial:  12.9 LV IVS:        1.20 cm   LV e' lateral:   9.46 cm/s LVOT diam:     2.00 cm   LV E/e' lateral: 9.9 LV SV:         63 LV SV Index:   29        2D Longitudinal Strain LVOT Area:     3.14 cm  2D Strain GLS Avg:     -16.3 %  RIGHT VENTRICLE RVSP:           25.3 mmHg LEFT ATRIUM         Index       RIGHT ATRIUM LA diam:    4.00 cm 1.82 cm/m  RA Pressure: 3.00 mmHg  AORTIC VALVE LVOT Vmax:   101.00 cm/s LVOT Vmean:  73.700 cm/s LVOT VTI:    0.201 m  AORTA Ao Root diam: 3.40 cm Ao Asc diam:  3.50 cm MITRAL VALVE               TRICUSPID VALVE MV Area (PHT): 2.33 cm    TR Peak grad:   22.3 mmHg MV Decel Time: 326 msec    TR Vmax:        236.00 cm/s MV E velocity: 93.80 cm/s  Estimated RAP:  3.00 mmHg MV A velocity: 88.60 cm/s  RVSP:           25.3 mmHg MV E/A ratio:  1.06                            SHUNTS                            Systemic VTI:  0.20 m                            Systemic Diam: 2.00 cm Candee Furbish MD Electronically signed by Candee Furbish MD Signature Date/Time:  10/04/2021/11:41:59 AM    Final     Impression/Plan: Healing well from radiotherapy to the breast tissue.  Continue skin care with topical BioOil for at least 2 more months for further healing.  Emotional support given today, and referral made to social work for counseling.  [facility] information given.  I encouraged her to continue followup with medical oncology. I will see her back on an as-needed basis. I have encouraged her to call if she has any issues or concerns in the future. I wished her the very best.  On date of service, in total, I spent 20 minutes on this encounter. Patient was seen in person.  _____________________________________   Eppie Gibson, MD

## 2021-11-01 ENCOUNTER — Telehealth: Payer: Self-pay

## 2021-11-01 NOTE — Telephone Encounter (Signed)
Notified Patient of completion of [place] 77 Form for Short Term Disability. Fax transmission confirmation received. Copy of form placed for pick-up as requested by Patient. No other needs or concerns voiced at this time.

## 2021-11-03 ENCOUNTER — Telehealth: Payer: Self-pay

## 2021-11-04 ENCOUNTER — Encounter: Payer: Self-pay | Admitting: Hematology and Oncology

## 2021-11-04 NOTE — Telephone Encounter (Signed)
Upon referral, Lysle Morales, counseling intern, called patient to schedule counseling session. The patient reported they have located their previous therapist and have scheduled an appointment with them.   The patient knows they can call counselor if needs arise.   Lysle Morales,  Counseling Intern [phone]

## 2021-11-08 NOTE — Progress Notes (Signed)
Patient Care Team: [name], MD as PCP - General (Family Medicine) Janina Mayo, MD as PCP - Cardiology (Cardiology) Nicholas Lose, MD as Consulting Physician (Hematology and Oncology) Servando Salina, MD as Consulting Physician (Obstetrics and Gynecology) Irene Limbo, MD as Consulting Physician (Plastic Surgery) Rolm Bookbinder, MD as Consulting Physician (General Surgery)  DIAGNOSIS:  Encounter Diagnosis  Name Primary?   Malignant neoplasm of upper-outer quadrant of right breast in female, estrogen receptor positive ([place]) Yes    SUMMARY OF ONCOLOGIC HISTORY: Oncology History  Malignant neoplasm of upper-outer quadrant of right breast in female, estrogen receptor positive ([place])  12/08/2020 Initial Diagnosis   21-week pregnancy: MRI at [facility] noncontrast revealed right axillary mass 3.7 cm, subpectoral lymph nodes, left supraclavicular lymph node, multiple level 2 and 3 lymph nodes in the right axilla, 2 breast masses 1.1 cm largest size.  Mammogram in [place] revealed 3 small masses at 12:00 1.2 cm, 0.6 cm, 0.9 cm and 5 abnormal axillary lymph nodes, pleomorphic calcifications measuring 10 cm, biopsy revealed grade 2 IDC with necrosis and calcifications ER 30%, PR 0%, HER2 positive, Ki-67 30 to 40%   12/15/2020 Cancer Staging   Staging form: Breast, AJCC 8th Edition - Clinical: Stage IV (cT3, cN2, pM1, G2, ER+, PR-, HER2+) - Signed by Nicholas Lose, MD on 12/15/2020 Histologic grading system: 3 grade system   12/24/2020 - 02/23/2021 Chemotherapy   Patient is on Treatment Plan : BREAST Adjuvant AC q21d     12/29/2020 Genetic Testing   Negative hereditary cancer genetic testing: no pathogenic variants detected in [facility] BRCAPlus Panel or [employer] CustomNext-Cancer +RNAinsight Panel.  The report dates are 12/23/2020 and 12/29/2020.   The BRCAplus panel offered by [employer] and includes sequencing and deletion/duplication analysis for the following 8 genes: ATM, BRCA1,  BRCA2, CDH1, CHEK2, PALB2, PTEN, and TP53.  The CustomNext-Cancer+RNAinsight panel offered by [facility] includes sequencing and rearrangement analysis for the following 47 genes:  APC, ATM, AXIN2, BARD1, BMPR1A, BRCA1, BRCA2, BRIP1, CDH1, CDK4, CDKN2A, CHEK2, DICER1, EPCAM, GREM1, HOXB13, MEN1, MLH1, MSH2, MSH3, MSH6, MUTYH, NBN, NF1, NF2, NTHL1, PALB2, PMS2, POLD1, POLE, PTEN, RAD51C, RAD51D, RECQL, RET, SDHA, SDHAF2, SDHB, SDHC, SDHD, SMAD4, SMARCA4, STK11, TP53, TSC1, TSC2, and VHL.  RNA data is routinely analyzed for use in variant interpretation for all genes.   03/25/2021 - 06/30/2021 Chemotherapy   Patient is on Treatment Plan : BREAST AC q21 days / Trastuzumab + Pertuzumab + Weekly Paclitaxel q21d     07/29/2021 - 09/09/2021 Chemotherapy   Patient is on Treatment Plan : BREAST ADO-Trastuzumab Emtansine (Kadcyla) q21d     07/29/2021 -  Chemotherapy   Patient is on Treatment Plan : BREAST ADO-Trastuzumab Emtansine (Kadcyla) q21d       CHIEF COMPLIANT: Follow-up Kadcyla cycle 6  INTERVAL HISTORY: Evelyn Marshall is a 36 year old above-mentioned history of breast cancer who is HER2 positive and she previously received Taxol Herceptin Perjeta. She presents to the clinic today for a follow-up. She reports no new issues. Denies any side effects or symptoms. She is tolerating the injections and the anastrozole extremely well.   ALLERGIES:  has No Known Allergies.  MEDICATIONS:  Current Outpatient Medications  Medication Sig Dispense Refill   anastrozole (ARIMIDEX) 1 MG tablet Take 1 tablet (1 mg total) by mouth daily. 90 tablet 3   carvedilol (COREG) 25 MG tablet Take 1 tablet (25 mg total) by mouth 2 (two) times daily. 180 tablet 3   Iron-FA-B Cmp-C-Biot-Probiotic (FUSION PLUS) CAPS Take 1  capsule by mouth daily.     lidocaine-prilocaine (EMLA) cream Apply 1 application. topically as needed (apply once weekly prior to port a cath needle insertion.). 30 g 0   losartan (COZAAR) 25 MG tablet Take  2 tablets (50 mg total) by mouth daily. 180 tablet 1   ondansetron (ZOFRAN) 8 MG tablet Take 1 tablet (8 mg total) by mouth every 8 (eight) hours as needed for nausea or vomiting. 20 tablet 1   No current facility-administered medications for this visit.    PHYSICAL EXAMINATION: ECOG PERFORMANCE STATUS: 1 - Symptomatic but completely ambulatory  Vitals:   11/11/21 0921  BP: (!) 95/54  Pulse: 80  Resp: 18  Temp: (!) 97.3 F (36.3 C)  SpO2: 100%   Filed Weights   11/11/21 0921  Weight: 248 lb 6.4 oz (112.7 kg)     LABORATORY DATA:  I have reviewed the data as listed    Latest Ref Rng & Units 10/21/2021    9:36 AM 09/30/2021    9:41 AM 09/09/2021   10:15 AM  CMP  Glucose 70 - 99 mg/dL 98  98  117   BUN 6 - 20 mg/dL '9  7  9   ' Creatinine 0.44 - 1.00 mg/dL 0.60  0.59  0.70   Sodium 135 - 145 mmol/L 136  137  138   Potassium 3.5 - 5.1 mmol/L 3.7  3.4  3.4   Chloride 98 - 111 mmol/L 103  104  104   CO2 22 - 32 mmol/L '28  27  27   ' Calcium 8.9 - 10.3 mg/dL 9.5  9.4  9.4   Total Protein 6.5 - 8.1 g/dL 8.0  7.4  7.4   Total Bilirubin 0.3 - 1.2 mg/dL 0.4  0.3  0.4   Alkaline Phos 38 - 126 U/L 113  98  101   AST 15 - 41 U/L 38  23  30   ALT 0 - 44 U/L '26  15  25     ' Lab Results  Component Value Date   WBC 6.2 11/11/2021   HGB 11.4 (L) 11/11/2021   HCT 34.9 (L) 11/11/2021   MCV 76.0 (L) 11/11/2021   PLT 255 11/11/2021   NEUTROABS 4.2 11/11/2021    ASSESSMENT & PLAN:  Malignant neoplasm of upper-outer quadrant of right breast in female, estrogen receptor positive ([place]) 12/08/2020:21-week pregnancy: MRI at [facility] noncontrast revealed right axillary mass 3.7 cm, subpectoral lymph nodes, left supraclavicular lymph node, multiple level 2 and 3 lymph nodes in the right axilla, 2 breast masses 1.1 cm largest size.  Mammogram in [place] revealed 3 small masses at 12:00 1.2 cm, 0.6 cm, 0.9 cm and 5 abnormal axillary lymph nodes, pleomorphic calcifications measuring 10 cm, biopsy  revealed grade 2 IDC with necrosis and calcifications ER 30%, PR 0%, HER2 positive, Ki-67 30 to 40%   PET/CT 03/24/2021: Multifocal right breast cancer, hypermetabolic right axillary, subpectoral, bilateral supraclavicular and mediastinal lymph nodes   Treatment plan: 1.  Neoadjuvant chemotherapy with Adriamycin and Cytoxan every 3 weeks x4 started 12/24/2020 completed 02/23/2021 2. delivery of the baby 3.  Continue neoadjuvant therapy with Taxol Herceptin and Perjeta (Taxol completed 06/09/21) 4.  Mastectomy with ALND:  07/05/21: 8 mm residual HG IDC with LVI, Margins Neg 4.  Kadcyla started 07/29/2021 5.  Adjuvant radiation therapy (radiation oncology is not certain of benefits of radiation given PET/CT findings) 6.  Adjuvant antiestrogen therapy with ovarian function suppression Genetic testing ---------------------------------------------------------------------------------------------------------------------------------- Current treatment:  1.  Kadcyla started 07/29/2021, today is cycle 6 2. Zoladex with anastrozole started 10/28/2021: Tolerating extremely well without any problems or concerns   Kadcyla toxicities:  mild fatigue Anemia: Microcytic: With normal iron studies.  Hemoglobin electrophoresis was ordered but has not been performed.   Monitoring closely for toxicities   08/09/2021: PET CT scan: Right mastectomy and right axillary lymph node dissection: Postoperative changes and seroma.  No convincing evidence of distant metastatic disease.   Radiation: Started 08/23/2021 - 10/03/2021 Left breast mammogram will be ordered for November. Her son is now 53 months old.  Return to clinic every 3 weeks for Kadcyla treatment.      Orders Placed This Encounter  Procedures   MM DIAG BREAST TOMO UNI LEFT    Standing Status:   Future    Standing Expiration Date:   11/12/2022    Order Specific Question:   Reason for Exam (SYMPTOM  OR DIAGNOSIS REQUIRED)    Answer:   annual mammogram    Order  Specific Question:   Is the patient pregnant?    Answer:   No    Order Specific Question:   Preferred imaging location?    Answer:   External    Comments:   [facility]   The patient has a good understanding of the overall plan. she agrees with it. she will call with any problems that may develop before the next visit here. Total time spent: 30 mins including face to face time and time spent for planning, charting and co-ordination of care   Harriette Ohara, MD 11/11/21    I Gardiner Coins am scribing for Dr. Lindi Adie  I have reviewed the above documentation for accuracy and completeness, and I agree with the above.

## 2021-11-11 ENCOUNTER — Inpatient Hospital Stay: Payer: BC Managed Care – PPO

## 2021-11-11 ENCOUNTER — Inpatient Hospital Stay (HOSPITAL_BASED_OUTPATIENT_CLINIC_OR_DEPARTMENT_OTHER): Payer: BC Managed Care – PPO | Admitting: Hematology and Oncology

## 2021-11-11 VITALS — BP 95/54 | HR 80 | Temp 97.3°F | Resp 18 | Ht 64.0 in | Wt 248.4 lb

## 2021-11-11 VITALS — BP 125/65 | HR 74 | Resp 16

## 2021-11-11 DIAGNOSIS — Z170 Estrogen receptor positive status [ER+]: Secondary | ICD-10-CM

## 2021-11-11 DIAGNOSIS — Z79811 Long term (current) use of aromatase inhibitors: Secondary | ICD-10-CM | POA: Insufficient documentation

## 2021-11-11 DIAGNOSIS — C50411 Malignant neoplasm of upper-outer quadrant of right female breast: Secondary | ICD-10-CM | POA: Diagnosis present

## 2021-11-11 DIAGNOSIS — Z5112 Encounter for antineoplastic immunotherapy: Secondary | ICD-10-CM | POA: Insufficient documentation

## 2021-11-11 DIAGNOSIS — D649 Anemia, unspecified: Secondary | ICD-10-CM | POA: Diagnosis not present

## 2021-11-11 DIAGNOSIS — Z79899 Other long term (current) drug therapy: Secondary | ICD-10-CM | POA: Diagnosis not present

## 2021-11-11 LAB — CBC WITH DIFFERENTIAL (CANCER CENTER ONLY)
Abs Immature Granulocytes: 0.02 10*3/uL (ref 0.00–0.07)
Basophils Absolute: 0.1 10*3/uL (ref 0.0–0.1)
Basophils Relative: 1 %
Eosinophils Absolute: 0.1 10*3/uL (ref 0.0–0.5)
Eosinophils Relative: 2 %
HCT: 34.9 % — ABNORMAL LOW (ref 36.0–46.0)
Hemoglobin: 11.4 g/dL — ABNORMAL LOW (ref 12.0–15.0)
Immature Granulocytes: 0 %
Lymphocytes Relative: 15 %
Lymphs Abs: 0.9 10*3/uL (ref 0.7–4.0)
MCH: 24.8 pg — ABNORMAL LOW (ref 26.0–34.0)
MCHC: 32.7 g/dL (ref 30.0–36.0)
MCV: 76 fL — ABNORMAL LOW (ref 80.0–100.0)
Monocytes Absolute: 0.8 10*3/uL (ref 0.1–1.0)
Monocytes Relative: 13 %
Neutro Abs: 4.2 10*3/uL (ref 1.7–7.7)
Neutrophils Relative %: 69 %
Platelet Count: 255 10*3/uL (ref 150–400)
RBC: 4.59 MIL/uL (ref 3.87–5.11)
RDW: 18 % — ABNORMAL HIGH (ref 11.5–15.5)
WBC Count: 6.2 10*3/uL (ref 4.0–10.5)
nRBC: 0 % (ref 0.0–0.2)

## 2021-11-11 LAB — CMP (CANCER CENTER ONLY)
ALT: 42 U/L (ref 0–44)
AST: 53 U/L — ABNORMAL HIGH (ref 15–41)
Albumin: 4 g/dL (ref 3.5–5.0)
Alkaline Phosphatase: 156 U/L — ABNORMAL HIGH (ref 38–126)
Anion gap: 7 (ref 5–15)
BUN: 21 mg/dL — ABNORMAL HIGH (ref 6–20)
CO2: 27 mmol/L (ref 22–32)
Calcium: 9.7 mg/dL (ref 8.9–10.3)
Chloride: 100 mmol/L (ref 98–111)
Creatinine: 0.91 mg/dL (ref 0.44–1.00)
GFR, Estimated: >60 mL/min (ref >60)
Glucose, Bld: 106 mg/dL — ABNORMAL HIGH (ref 70–99)
Potassium: 4.1 mmol/L (ref 3.5–5.1)
Sodium: 134 mmol/L — ABNORMAL LOW (ref 135–145)
Total Bilirubin: 0.4 mg/dL (ref 0.3–1.2)
Total Protein: 8.3 g/dL — ABNORMAL HIGH (ref 6.5–8.1)

## 2021-11-11 MED ORDER — HEPARIN SOD (PORK) LOCK FLUSH 100 UNIT/ML IV SOLN
500.0000 [IU] | Freq: Once | INTRAVENOUS | Status: AC | PRN
  Administered 2021-11-11: 500 [IU]

## 2021-11-11 MED ORDER — PROCHLORPERAZINE EDISYLATE 10 MG/2ML IJ SOLN: 10.0000 mg | Freq: Once | INTRAMUSCULAR | Status: DC

## 2021-11-11 MED ORDER — ACETAMINOPHEN 325 MG PO TABS
650.0000 mg | ORAL_TABLET | Freq: Once | ORAL | Status: AC
  Administered 2021-11-11: 650 mg via ORAL
  Filled 2021-11-11: qty 2

## 2021-11-11 MED ORDER — PROCHLORPERAZINE MALEATE 10 MG PO TABS
10.0000 mg | ORAL_TABLET | Freq: Once | ORAL | Status: AC
  Administered 2021-11-11: 10 mg via ORAL
  Filled 2021-11-11: qty 1

## 2021-11-11 MED ORDER — SODIUM CHLORIDE 0.9 % IV SOLN
3.6000 mg/kg | Freq: Once | INTRAVENOUS | Status: AC
  Administered 2021-11-11: 420 mg via INTRAVENOUS
  Filled 2021-11-11: qty 8

## 2021-11-11 MED ORDER — SODIUM CHLORIDE 0.9% FLUSH
10.0000 mL | INTRAVENOUS | Status: DC | PRN
  Administered 2021-11-11: 10 mL

## 2021-11-11 MED ORDER — SODIUM CHLORIDE 0.9 % IV SOLN: Freq: Once | INTRAVENOUS | Status: AC

## 2021-11-11 MED ORDER — DIPHENHYDRAMINE HCL 25 MG PO CAPS
50.0000 mg | ORAL_CAPSULE | Freq: Once | ORAL | Status: AC
  Administered 2021-11-11: 50 mg via ORAL
  Filled 2021-11-11: qty 2

## 2021-11-11 NOTE — Patient Instructions (Signed)
[facility] MEDICAL [facility]COLOGY  Discharge Instructions: Thank you for choosing [facility] to provide your oncology and hematology care.   If you have a lab appointment with the Cancer Center, please go directly to the Cancer Center and check in at the registration area.   Wear comfortable clothing and clothing appropriate for easy access to any Portacath or PICC line.   We strive to give you quality time with your provider. You may need to reschedule your appointment if you arrive late (15 or more minutes).  Arriving late affects you and other patients whose appointments are after yours.  Also, if you miss three or more appointments without notifying the office, you may be dismissed from the clinic at the provider's discretion.      For prescription refill requests, have your pharmacy contact our office and allow 72 hours for refills to be completed.    Today you received the following chemotherapy and/or immunotherapy agents : Kadcyla      To help prevent nausea and vomiting after your treatment, we encourage you to take your nausea medication as directed.  BELOW ARE SYMPTOMS THAT SHOULD BE REPORTED IMMEDIATELY: *FEVER GREATER THAN 100.4 F (38 C) OR HIGHER *CHILLS OR SWEATING *NAUSEA AND VOMITING THAT IS NOT CONTROLLED WITH YOUR NAUSEA MEDICATION *UNUSUAL SHORTNESS OF BREATH *UNUSUAL BRUISING OR BLEEDING *URINARY PROBLEMS (pain or burning when urinating, or frequent urination) *BOWEL PROBLEMS (unusual diarrhea, constipation, pain near the anus) TENDERNESS IN MOUTH AND THROAT WITH OR WITHOUT PRESENCE OF ULCERS (sore throat, sores in mouth, or a toothache) UNUSUAL RASH, SWELLING OR PAIN  UNUSUAL VAGINAL DISCHARGE OR ITCHING   Items with * indicate a potential emergency and should be followed up as soon as possible or go to the Emergency Department if any problems should occur.  Please show the CHEMOTHERAPY ALERT CARD or IMMUNOTHERAPY ALERT CARD at check-in to  the Emergency Department and triage nurse.  Should you have questions after your visit or need to cancel or reschedule your appointment, please contact [facility]ICAL [facility]COL[facility]  Dept: [phone]  and follow the prompts.  Office hours are 8:00 a.m. to 4:30 p.m. Monday - Friday. Please note that voicemails left after 4:00 p.m. may not be returned until the following business day.  We are closed weekends and major holidays. You have access to a nurse at all times for urgent questions. Please call the main number to the clinic Dept: [phone] and follow the prompts.   For any non-urgent questions, you may also contact your provider using MyChart. We now offer e-Visits for anyone 18 and older to request care online for non-urgent symptoms. For details visit [email or web address].   Also download the MyChart app! Go to the app store, search "MyChart", open the app, select [facility], and log in with your MyChart username and password.  Masks are optional in the cancer centers. If you would like for your care team to wear a mask while they are taking care of you, please let them know. You may have one support person who is at least 34 years old accompany you for your appointments. 

## 2021-11-11 NOTE — Progress Notes (Signed)
Patient declined to stay for 30 minute post observation. Patient ambulatory and VSS at discharge.  

## 2021-11-11 NOTE — Assessment & Plan Note (Signed)
12/08/2020:21-week pregnancy: MRI at [place] [facility] noncontrast revealed right axillary mass 3.7 cm, subpectoral lymph nodes, left supraclavicular lymph node, multiple level 2 and 3 lymph nodes in the right axilla, 2 breast masses 1.1 cm largest size. Mammogram in [place] revealed 3 small masses at 12:00 1.2 cm, 0.6 cm, 0.9 cm and 5 abnormal axillary lymph nodes, pleomorphic calcifications measuring 10 cm, biopsy revealed grade 2 IDC with necrosis and calcifications ER 30%, PR 0%, HER2 positive, Ki-67 30 to 40%  PET/CT 03/24/2021: Multifocal right breast cancer, hypermetabolic right axillary, subpectoral, bilateral supraclavicular and mediastinal lymph nodes  Treatment plan: 1. Neoadjuvant chemotherapy with Adriamycin and Cytoxan every 3 weeks x4started 12/24/2020 completed 02/23/2021 2. delivery of the baby 3. Continue neoadjuvant therapy with Taxol Herceptin and Perjeta(Taxol completed 06/09/21) 4.Mastectomy with ALND: 07/05/21: 8 mm residual HG IDC with LVI, Margins Neg 4.Kadcyla started 07/29/2021 5. Adjuvant radiation therapy(radiation oncology is not certain of benefits of radiation given PET/CT findings) 6. Adjuvant antiestrogen therapy with ovarian function suppression Genetic testing ---------------------------------------------------------------------------------------------------------------------------------- Current treatment:  1. Kadcyla started 07/29/2021, today is cycle 6 2. Zoladex with anastrozole started 10/28/2021  Kadcyla toxicities:  mild fatigue Anemia: Microcytic: With normal iron studies.  We will send hemoglobin electrophoresis.  Monitoring closely for toxicities  08/09/2021: PET CT scan: Right mastectomy and right axillary lymph node dissection: Postoperative changes and seroma. No convincing evidence of distant metastatic disease.  Radiation: Started 08/23/2021 - 10/03/2021  Return to clinic every 3 weeks for Kadcyla treatment.

## 2021-11-14 ENCOUNTER — Telehealth: Payer: Self-pay | Admitting: Hematology and Oncology

## 2021-11-14 NOTE — Telephone Encounter (Signed)
Scheduled appointment per 10/20 los. Patient is aware.

## 2021-11-15 ENCOUNTER — Other Ambulatory Visit: Payer: Self-pay

## 2021-11-15 ENCOUNTER — Other Ambulatory Visit: Payer: Self-pay | Admitting: Pharmacist

## 2021-11-15 DIAGNOSIS — I10 Essential (primary) hypertension: Secondary | ICD-10-CM

## 2021-11-25 ENCOUNTER — Inpatient Hospital Stay: Payer: BC Managed Care – PPO | Attending: Hematology and Oncology

## 2021-11-25 VITALS — BP 118/73 | HR 68 | Resp 16

## 2021-11-25 DIAGNOSIS — C50411 Malignant neoplasm of upper-outer quadrant of right female breast: Secondary | ICD-10-CM | POA: Diagnosis present

## 2021-11-25 DIAGNOSIS — Z5111 Encounter for antineoplastic chemotherapy: Secondary | ICD-10-CM | POA: Insufficient documentation

## 2021-11-25 DIAGNOSIS — Z5112 Encounter for antineoplastic immunotherapy: Secondary | ICD-10-CM | POA: Insufficient documentation

## 2021-11-25 DIAGNOSIS — D509 Iron deficiency anemia, unspecified: Secondary | ICD-10-CM | POA: Diagnosis not present

## 2021-11-25 DIAGNOSIS — Z95828 Presence of other vascular implants and grafts: Secondary | ICD-10-CM

## 2021-11-25 DIAGNOSIS — Z170 Estrogen receptor positive status [ER+]: Secondary | ICD-10-CM | POA: Diagnosis not present

## 2021-11-25 MED ORDER — GOSERELIN ACETATE 3.6 MG ~~LOC~~ IMPL
3.6000 mg | DRUG_IMPLANT | Freq: Once | SUBCUTANEOUS | Status: AC
  Administered 2021-11-25: 3.6 mg via SUBCUTANEOUS
  Filled 2021-11-25: qty 3.6

## 2021-11-29 ENCOUNTER — Telehealth: Payer: Self-pay | Admitting: *Deleted

## 2021-11-29 NOTE — Telephone Encounter (Signed)
11/28/2021 Late entry. [place] forms 7A and 704 completed along with [employer] form.  To collaborative pick up bin for provider review, signature and return to this nurse.

## 2021-12-01 NOTE — Progress Notes (Signed)
Patient Care Team: [name], MD as PCP - General (Family Medicine) Janina Mayo, MD as PCP - Cardiology (Cardiology) Nicholas Lose, MD as Consulting Physician (Hematology and Oncology) Servando Salina, MD as Consulting Physician (Obstetrics and Gynecology) Irene Limbo, MD as Consulting Physician (Plastic Surgery) Rolm Bookbinder, MD as Consulting Physician (General Surgery)  DIAGNOSIS:  Encounter Diagnosis  Name Primary?   Malignant neoplasm of upper-outer quadrant of right breast in female, estrogen receptor positive ([place]) Yes    SUMMARY OF ONCOLOGIC HISTORY: Oncology History  Malignant neoplasm of upper-outer quadrant of right breast in female, estrogen receptor positive ([place])  12/08/2020 Initial Diagnosis   21-week pregnancy: MRI at [facility] noncontrast revealed right axillary mass 3.7 cm, subpectoral lymph nodes, left supraclavicular lymph node, multiple level 2 and 3 lymph nodes in the right axilla, 2 breast masses 1.1 cm largest size.  Mammogram in [place] revealed 3 small masses at 12:00 1.2 cm, 0.6 cm, 0.9 cm and 5 abnormal axillary lymph nodes, pleomorphic calcifications measuring 10 cm, biopsy revealed grade 2 IDC with necrosis and calcifications ER 30%, PR 0%, HER2 positive, Ki-67 30 to 40%   12/15/2020 Cancer Staging   Staging form: Breast, AJCC 8th Edition - Clinical: Stage IV (cT3, cN2, pM1, G2, ER+, PR-, HER2+) - Signed by Nicholas Lose, MD on 12/15/2020 Histologic grading system: 3 grade system   12/24/2020 - 02/23/2021 Chemotherapy   Patient is on Treatment Plan : BREAST Adjuvant AC q21d     12/29/2020 Genetic Testing   Negative hereditary cancer genetic testing: no pathogenic variants detected in [facility] BRCAPlus Panel or [facility] CustomNext-Cancer +RNAinsight Panel.  The report dates are 12/23/2020 and 12/29/2020.   The BRCAplus panel offered by [employer] and includes sequencing and deletion/duplication analysis for the following 8 genes: ATM, BRCA1,  BRCA2, CDH1, CHEK2, PALB2, PTEN, and TP53.  The CustomNext-Cancer+RNAinsight panel offered by [employer] [facility] includes sequencing and rearrangement analysis for the following 47 genes:  APC, ATM, AXIN2, BARD1, BMPR1A, BRCA1, BRCA2, BRIP1, CDH1, CDK4, CDKN2A, CHEK2, DICER1, EPCAM, GREM1, HOXB13, MEN1, MLH1, MSH2, MSH3, MSH6, MUTYH, NBN, NF1, NF2, NTHL1, PALB2, PMS2, POLD1, POLE, PTEN, RAD51C, RAD51D, RECQL, RET, SDHA, SDHAF2, SDHB, SDHC, SDHD, SMAD4, SMARCA4, STK11, TP53, TSC1, TSC2, and VHL.  RNA data is routinely analyzed for use in variant interpretation for all genes.   03/25/2021 - 06/30/2021 Chemotherapy   Patient is on Treatment Plan : BREAST AC q21 days / Trastuzumab + Pertuzumab + Weekly Paclitaxel q21d     07/29/2021 - 09/09/2021 Chemotherapy   Patient is on Treatment Plan : BREAST ADO-Trastuzumab Emtansine (Kadcyla) q21d     07/29/2021 -  Chemotherapy   Patient is on Treatment Plan : BREAST ADO-Trastuzumab Emtansine (Kadcyla) q21d       CHIEF COMPLIANT: Follow-up Kadcyla   INTERVAL HISTORY: Evelyn Marshall is a 36 year old above-mentioned history of breast cancer who is HER2 positive and she previously received Taxol Herceptin Perjeta, she is currently on Kadcyla.. She presents to the clinic today for a follow-up. She had some concerns about getting her ovaries removed.  She is tolerating Extremely Well without Any Problems or Concerns.   ALLERGIES:  has No Known Allergies.  MEDICATIONS:  Current Outpatient Medications  Medication Sig Dispense Refill   anastrozole (ARIMIDEX) 1 MG tablet Take 1 tablet (1 mg total) by mouth daily. 90 tablet 3   carvedilol (COREG) 25 MG tablet Take 1 tablet (25 mg total) by mouth 2 (two) times daily. 180 tablet 3   lidocaine-prilocaine (EMLA) cream Apply  1 application. topically as needed (apply once weekly prior to port a cath needle insertion.). 30 g 0   losartan (COZAAR) 25 MG tablet TAKE 2 TABLETS(50 MG) BY MOUTH DAILY 180 tablet 1   ondansetron (ZOFRAN) 8  MG tablet Take 1 tablet (8 mg total) by mouth every 8 (eight) hours as needed for nausea or vomiting. 20 tablet 1   No current facility-administered medications for this visit.    PHYSICAL EXAMINATION: ECOG PERFORMANCE STATUS: 1 - Symptomatic but completely ambulatory  Vitals:   12/02/21 0940  BP: 123/75  Pulse: 75  Resp: 18  Temp: (!) 97.2 F (36.2 C)  SpO2: 100%   Filed Weights   12/02/21 0940  Weight: 250 lb 9.6 oz (113.7 kg)      LABORATORY DATA:  I have reviewed the data as listed    Latest Ref Rng & Units 12/02/2021    9:26 AM 11/11/2021    9:01 AM 10/21/2021    9:36 AM  CMP  Glucose 70 - 99 mg/dL 103  106  98   BUN 6 - 20 mg/dL _0 Creatinine 0.44 - 1.00 mg/dL 0.77  0.91  0.60   Sodium 135 - 145 mmol/L 138  134  136   Potassium 3.5 - 5.1 mmol/L 3.7  4.1  3.7   Chloride 98 - 111 mmol/L 104  100  103   CO2 22 - 32 mmol/L _1 Calcium 8.9 - 10.3 mg/dL 9.3  9.7  9.5   Total Protein 6.5 - 8.1 g/dL 8.5  8.3  8.0   Total Bilirubin 0.3 - 1.2 mg/dL 0.5  0.4  0.4   Alkaline Phos 38 - 126 U/L 143  156  113   AST 15 - 41 U/L 42  53  38   ALT 0 - 44 U/L 30  42  26     Lab Results  Component Value Date   WBC 4.7 12/02/2021   HGB 10.5 (L) 12/02/2021   HCT 32.8 (L) 12/02/2021   MCV 77.2 (L) 12/02/2021   PLT 280 12/02/2021   NEUTROABS 2.7 12/02/2021    ASSESSMENT & PLAN:  Malignant neoplasm of upper-outer quadrant of right breast in female, estrogen receptor positive ([place]) 12/08/2020:21-week pregnancy: MRI at [facility] noncontrast revealed right axillary mass 3.7 cm, subpectoral lymph nodes, left supraclavicular lymph node, multiple level 2 and 3 lymph nodes in the right axilla, 2 breast masses 1.1 cm largest size.  Mammogram in [place] revealed 3 small masses at 12:00 1.2 cm, 0.6 cm, 0.9 cm and 5 abnormal axillary lymph nodes, pleomorphic calcifications measuring 10 cm, biopsy revealed grade 2 IDC with necrosis and calcifications ER 30%, PR 0%, HER2  positive, Ki-67 30 to 40%   PET/CT 03/24/2021: Multifocal right breast cancer, hypermetabolic right axillary, subpectoral, bilateral supraclavicular and mediastinal lymph nodes   Treatment plan: 1.  Neoadjuvant chemotherapy with Adriamycin and Cytoxan every 3 weeks x4 started 12/24/2020 completed 02/23/2021 2. delivery of the baby 3.  Continue neoadjuvant therapy with Taxol Herceptin and Perjeta (Taxol completed 06/09/21) 4.  Mastectomy with ALND:  07/05/21: 8 mm residual HG IDC with LVI, Margins Neg 4.  Kadcyla started 07/29/2021 5.  Adjuvant radiation therapy (radiation oncology is not certain of benefits of radiation given PET/CT findings) 6.  Adjuvant antiestrogen therapy with ovarian function suppression Genetic testing ---------------------------------------------------------------------------------------------------------------------------------- Current treatment:  1. Kadcyla started 07/29/2021, today is cycle 7 2. Zoladex with anastrozole started 10/28/2021:  Tolerating extremely well without any problems or concerns   Kadcyla toxicities:  mild fatigue Anemia: Microcytic: With normal iron studies.  Hemoglobin electrophoresis was ordered but has not been performed.   Monitoring closely for toxicities   08/09/2021: PET CT scan: Right mastectomy and right axillary lymph node dissection: Postoperative changes and seroma.  No convincing evidence of distant metastatic disease.   Radiation: Started 08/23/2021 - 10/03/2021 Left breast mammogram has been ordered for November.  I instructed her to call [facility] and get that appointment. Her son will be 89-year-old in February   Return to clinic every 3 weeks for Kadcyla treatment.    No orders of the defined types were placed in this encounter.  The patient has a good understanding of the overall plan. she agrees with it. she will call with any problems that may develop before the next visit here. Total time spent: 30 mins including face to face time  and time spent for planning, charting and co-ordination of care   Harriette Ohara, MD 12/02/21    I Gardiner Coins am scribing for Dr. Lindi Adie  I have reviewed the above documentation for accuracy and completeness, and I agree with the above.

## 2021-12-02 ENCOUNTER — Inpatient Hospital Stay (HOSPITAL_BASED_OUTPATIENT_CLINIC_OR_DEPARTMENT_OTHER): Payer: BC Managed Care – PPO | Admitting: Hematology and Oncology

## 2021-12-02 ENCOUNTER — Inpatient Hospital Stay: Payer: BC Managed Care – PPO

## 2021-12-02 ENCOUNTER — Other Ambulatory Visit: Payer: Self-pay | Admitting: Hematology and Oncology

## 2021-12-02 VITALS — BP 117/61 | HR 71 | Resp 16

## 2021-12-02 VITALS — BP 123/75 | HR 75 | Temp 97.2°F | Resp 18 | Ht 64.0 in | Wt 250.6 lb

## 2021-12-02 DIAGNOSIS — C50411 Malignant neoplasm of upper-outer quadrant of right female breast: Secondary | ICD-10-CM

## 2021-12-02 DIAGNOSIS — D509 Iron deficiency anemia, unspecified: Secondary | ICD-10-CM

## 2021-12-02 DIAGNOSIS — Z5112 Encounter for antineoplastic immunotherapy: Secondary | ICD-10-CM | POA: Diagnosis not present

## 2021-12-02 DIAGNOSIS — Z170 Estrogen receptor positive status [ER+]: Secondary | ICD-10-CM

## 2021-12-02 DIAGNOSIS — Z95828 Presence of other vascular implants and grafts: Secondary | ICD-10-CM

## 2021-12-02 LAB — CMP (CANCER CENTER ONLY)
ALT: 30 U/L (ref 0–44)
AST: 42 U/L — ABNORMAL HIGH (ref 15–41)
Albumin: 3.8 g/dL (ref 3.5–5.0)
Alkaline Phosphatase: 143 U/L — ABNORMAL HIGH (ref 38–126)
Anion gap: 7 (ref 5–15)
BUN: 10 mg/dL (ref 6–20)
CO2: 27 mmol/L (ref 22–32)
Calcium: 9.3 mg/dL (ref 8.9–10.3)
Chloride: 104 mmol/L (ref 98–111)
Creatinine: 0.77 mg/dL (ref 0.44–1.00)
GFR, Estimated: >60 mL/min (ref >60)
Glucose, Bld: 103 mg/dL — ABNORMAL HIGH (ref 70–99)
Potassium: 3.7 mmol/L (ref 3.5–5.1)
Sodium: 138 mmol/L (ref 135–145)
Total Bilirubin: 0.5 mg/dL (ref 0.3–1.2)
Total Protein: 8.5 g/dL — ABNORMAL HIGH (ref 6.5–8.1)

## 2021-12-02 LAB — CBC WITH DIFFERENTIAL (CANCER CENTER ONLY)
Abs Immature Granulocytes: 0.01 10*3/uL (ref 0.00–0.07)
Basophils Absolute: 0.1 10*3/uL (ref 0.0–0.1)
Basophils Relative: 1 %
Eosinophils Absolute: 0.1 10*3/uL (ref 0.0–0.5)
Eosinophils Relative: 3 %
HCT: 32.8 % — ABNORMAL LOW (ref 36.0–46.0)
Hemoglobin: 10.5 g/dL — ABNORMAL LOW (ref 12.0–15.0)
Immature Granulocytes: 0 %
Lymphocytes Relative: 29 %
Lymphs Abs: 1.4 10*3/uL (ref 0.7–4.0)
MCH: 24.7 pg — ABNORMAL LOW (ref 26.0–34.0)
MCHC: 32 g/dL (ref 30.0–36.0)
MCV: 77.2 fL — ABNORMAL LOW (ref 80.0–100.0)
Monocytes Absolute: 0.5 10*3/uL (ref 0.1–1.0)
Monocytes Relative: 11 %
Neutro Abs: 2.7 10*3/uL (ref 1.7–7.7)
Neutrophils Relative %: 56 %
Platelet Count: 280 10*3/uL (ref 150–400)
RBC: 4.25 MIL/uL (ref 3.87–5.11)
RDW: 18.6 % — ABNORMAL HIGH (ref 11.5–15.5)
WBC Count: 4.7 10*3/uL (ref 4.0–10.5)
nRBC: 0 % (ref 0.0–0.2)

## 2021-12-02 MED ORDER — SODIUM CHLORIDE 0.9 % IV SOLN: Freq: Once | INTRAVENOUS | Status: AC

## 2021-12-02 MED ORDER — DIPHENHYDRAMINE HCL 25 MG PO CAPS
50.0000 mg | ORAL_CAPSULE | Freq: Once | ORAL | Status: AC
  Administered 2021-12-02: 50 mg via ORAL
  Filled 2021-12-02: qty 2

## 2021-12-02 MED ORDER — SODIUM CHLORIDE 0.9% FLUSH
10.0000 mL | INTRAVENOUS | Status: DC | PRN
  Administered 2021-12-02: 10 mL

## 2021-12-02 MED ORDER — HEPARIN SOD (PORK) LOCK FLUSH 100 UNIT/ML IV SOLN
500.0000 [IU] | Freq: Once | INTRAVENOUS | Status: AC | PRN
  Administered 2021-12-02: 500 [IU]

## 2021-12-02 MED ORDER — ACETAMINOPHEN 325 MG PO TABS
650.0000 mg | ORAL_TABLET | Freq: Once | ORAL | Status: AC
  Administered 2021-12-02: 650 mg via ORAL
  Filled 2021-12-02: qty 2

## 2021-12-02 MED ORDER — SODIUM CHLORIDE 0.9 % IV SOLN
3.6000 mg/kg | Freq: Once | INTRAVENOUS | Status: AC
  Administered 2021-12-02: 420 mg via INTRAVENOUS
  Filled 2021-12-02: qty 16

## 2021-12-02 MED ORDER — SODIUM CHLORIDE 0.9% FLUSH
10.0000 mL | Freq: Once | INTRAVENOUS | Status: AC
  Administered 2021-12-02: 10 mL

## 2021-12-02 MED ORDER — PROCHLORPERAZINE EDISYLATE 10 MG/2ML IJ SOLN: 10.0000 mg | Freq: Once | INTRAMUSCULAR | Status: DC

## 2021-12-02 MED ORDER — PROCHLORPERAZINE MALEATE 10 MG PO TABS
10.0000 mg | ORAL_TABLET | Freq: Once | ORAL | Status: AC
  Administered 2021-12-02: 10 mg via ORAL
  Filled 2021-12-02: qty 1

## 2021-12-02 NOTE — Assessment & Plan Note (Signed)
12/08/2020:21-week pregnancy: MRI at [facility] noncontrast revealed right axillary mass 3.7 cm, subpectoral lymph nodes, left supraclavicular lymph node, multiple level 2 and 3 lymph nodes in the right axilla, 2 breast masses 1.1 cm largest size.  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  Neoadjuvant chemotherapy with Adriamycin and Cytoxan every 3 weeks x4 started 12/24/2020 completed 02/23/2021 2. delivery of the baby 3.  Continue neoadjuvant therapy with Taxol Herceptin and Perjeta (Taxol completed 06/09/21) 4.  Mastectomy with ALND:  07/05/21: 8 mm residual HG IDC with LVI, Margins Neg 4.  Kadcyla started 07/29/2021 5.  Adjuvant radiation therapy (radiation oncology is not certain of benefits of radiation given PET/CT findings) 6.  Adjuvant antiestrogen therapy with ovarian function suppression Genetic testing ---------------------------------------------------------------------------------------------------------------------------------- Current treatment:  1. Kadcyla started 07/29/2021, today is cycle 7 2. Zoladex with anastrozole started 10/28/2021: Tolerating extremely well without any problems or concerns   Kadcyla toxicities:  mild fatigue Anemia: Microcytic: With normal iron studies.  Hemoglobin electrophoresis was ordered but has not been performed.   Monitoring closely for toxicities   08/09/2021: PET CT scan: Right mastectomy and right axillary lymph node dissection: Postoperative changes and seroma.  No convincing evidence of distant metastatic disease.   Radiation: Started 08/23/2021 - 10/03/2021 Left  breast mammogram has been ordered for November. Her son is now 17 months old.   Return to clinic every 3 weeks for Kadcyla treatment.

## 2021-12-02 NOTE — Patient Instructions (Signed)
[facility] MEDICAL ONCOLOGY   Discharge Instructions: Thank you for choosing [facility] to provide your oncology and hematology care.   If you have a lab appointment with the Cancer Center, please go directly to the Cancer Center and check in at the registration area.   Wear comfortable clothing and clothing appropriate for easy access to any Portacath or PICC line.   We strive to give you quality time with your provider. You may need to reschedule your appointment if you arrive late (15 or more minutes).  Arriving late affects you and other patients whose appointments are after yours.  Also, if you miss three or more appointments without notifying the office, you may be dismissed from the clinic at the provider's discretion.      For prescription refill requests, have your pharmacy contact our office and allow 72 hours for refills to be completed.    Today you received the following chemotherapy and/or immunotherapy agents: ado-trastuzumab emtansine      To help prevent nausea and vomiting after your treatment, we encourage you to take your nausea medication as directed.  BELOW ARE SYMPTOMS THAT SHOULD BE REPORTED IMMEDIATELY: *FEVER GREATER THAN 100.4 F (38 C) OR HIGHER *CHILLS OR SWEATING *NAUSEA AND VOMITING THAT IS NOT CONTROLLED WITH YOUR NAUSEA MEDICATION *UNUSUAL SHORTNESS OF BREATH *UNUSUAL BRUISING OR BLEEDING *URINARY PROBLEMS (pain or burning when urinating, or frequent urination) *BOWEL PROBLEMS (unusual diarrhea, constipation, pain near the anus) TENDERNESS IN MOUTH AND THROAT WITH OR WITHOUT PRESENCE OF ULCERS (sore throat, sores in mouth, or a toothache) UNUSUAL RASH, SWELLING OR PAIN  UNUSUAL VAGINAL DISCHARGE OR ITCHING   Items with * indicate a potential emergency and should be followed up as soon as possible or go to the Emergency Department if any problems should occur.  Please show the CHEMOTHERAPY ALERT CARD or IMMUNOTHERAPY ALERT  CARD at check-in to the Emergency Department and triage nurse.  Should you have questions after your visit or need to cancel or reschedule your appointment, please contact [facility]ICAL [facility]COL[facility]  Dept: [phone]  and follow the prompts.  Office hours are 8:00 a.m. to 4:30 p.m. Monday - Friday. Please note that voicemails left after 4:00 p.m. may not be returned until the following business day.  We are closed weekends and major holidays. You have access to a nurse at all times for urgent questions. Please call the main number to the clinic Dept: [phone] and follow the prompts.   For any non-urgent questions, you may also contact your provider using MyChart. We now offer e-Visits for anyone 18 and older to request care online for non-urgent symptoms. For details visit [email or web address].   Also download the MyChart app! Go to the app store, search "MyChart", open the app, select [facility], and log in with your MyChart username and password.  Masks are optional in the cancer centers. If you would like for your care team to wear a mask while they are taking care of you, please let them know. You may have one support person who is at least 34 years old accompany you for your appointments. 

## 2021-12-05 ENCOUNTER — Telehealth: Payer: Self-pay | Admitting: Hematology and Oncology

## 2021-12-05 NOTE — Telephone Encounter (Signed)
Scheduled appointment per 11/10 los. Left voicemail.

## 2021-12-05 NOTE — Telephone Encounter (Addendum)
Today this nurse received fax confirmation notices performed 12/02/2021 by other form staff nurse who also reported copies sent to [employer] bin for items to be scanned.  At this time this nurse faxed [place], 704 and 7A forms to ([facility]) [employer] for records requested.  Originals prepared to be mailed to [facility] address on file: [id number] [place] [facility] [id number] completes process.  No further instructions received, actions required or performed by this nurse.

## 2021-12-18 NOTE — Progress Notes (Signed)
Patient Care Team: [name], MD as PCP - General (Family Medicine) Janina Mayo, MD as PCP - Cardiology (Cardiology) Nicholas Lose, MD as Consulting Physician (Hematology and Oncology) Servando Salina, MD as Consulting Physician (Obstetrics and Gynecology) Irene Limbo, MD as Consulting Physician (Plastic Surgery) Rolm Bookbinder, MD as Consulting Physician (General Surgery)  DIAGNOSIS:  Encounter Diagnosis  Name Primary?   Malignant neoplasm of upper-outer quadrant of right breast in female, estrogen receptor positive ([place]) Yes    SUMMARY OF ONCOLOGIC HISTORY: Oncology History  Malignant neoplasm of upper-outer quadrant of right breast in female, estrogen receptor positive ([place])  12/08/2020 Initial Diagnosis   21-week pregnancy: MRI at [facility] noncontrast revealed right axillary mass 3.7 cm, subpectoral lymph nodes, left supraclavicular lymph node, multiple level 2 and 3 lymph nodes in the right axilla, 2 breast masses 1.1 cm largest size.  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  The report dates are 12/23/2020 and 12/29/2020.   The BRCAplus panel offered by [employer] and includes sequencing and deletion/duplication analysis for the following 8 genes: ATM, BRCA1,  BRCA2, CDH1, CHEK2, PALB2, PTEN, and TP53.  The CustomNext-Cancer+RNAinsight panel offered by [employer] [facility] includes sequencing and rearrangement analysis for the following 47 genes:  APC, ATM, AXIN2, BARD1, BMPR1A, BRCA1, BRCA2, BRIP1, CDH1, CDK4, CDKN2A, CHEK2, DICER1, EPCAM, GREM1, HOXB13, MEN1, MLH1, MSH2, MSH3, MSH6, MUTYH, NBN, NF1, NF2, NTHL1, PALB2, PMS2, POLD1, POLE, PTEN, RAD51C, RAD51D, RECQL, RET, SDHA, SDHAF2, SDHB, SDHC, SDHD, SMAD4, SMARCA4, STK11, TP53, TSC1, TSC2, and VHL.  RNA data is routinely analyzed for use in variant interpretation for all genes.   03/25/2021 - 06/30/2021 Chemotherapy   Patient is on Treatment Plan : BREAST AC q21 days / Trastuzumab + Pertuzumab + Weekly Paclitaxel q21d     07/29/2021 - 09/09/2021 Chemotherapy   Patient is on Treatment Plan : BREAST ADO-Trastuzumab Emtansine (Kadcyla) q21d     07/29/2021 -  Chemotherapy   Patient is on Treatment Plan : BREAST ADO-Trastuzumab Emtansine (Kadcyla) q21d       CHIEF COMPLIANT: Cycle 8 Kadcyla  INTERVAL HISTORY: Evelyn Marshall is a 36 y.o. female with right breast cancer. She presents to the clinic today for cycle 8 Kadcycla. She says last treatment went fine.She states when she walks all day she more exhausted. She always keep track of her steps to reach a goal. But overall she is doing good.  ALLERGIES:  has No Known Allergies.  MEDICATIONS:  Current Outpatient Medications  Medication Sig Dispense Refill   amoxicillin (AMOXIL) 500 MG capsule TAKE 1 CAPSULE BY MOUTH THREE TIMES DAILY UNTIL ALL TAKEN     anastrozole (ARIMIDEX) 1 MG tablet Take 1 tablet (1 mg total) by mouth daily. 90 tablet 3   carvedilol (COREG) 25 MG tablet Take 1 tablet (25  mg total) by mouth 2 (two) times daily. 180 tablet 3   ibuprofen (ADVIL) 600 MG tablet Take 600 mg by mouth every 6 (six) hours as needed.     lidocaine-prilocaine (EMLA) cream Apply 1 application. topically as needed (apply once weekly prior to port a cath needle insertion.).  30 g 0   losartan (COZAAR) 25 MG tablet TAKE 2 TABLETS(50 MG) BY MOUTH DAILY 180 tablet 1   ondansetron (ZOFRAN) 8 MG tablet Take 1 tablet (8 mg total) by mouth every 8 (eight) hours as needed for nausea or vomiting. 20 tablet 1   No current facility-administered medications for this visit.    PHYSICAL EXAMINATION: ECOG PERFORMANCE STATUS: 1 - Symptomatic but completely ambulatory  Vitals:   12/23/21 1003  BP: 137/71  Pulse: 64  Resp: 18  Temp: 97.8 F (36.6 C)  SpO2: 100%   Filed Weights   12/23/21 1003  Weight: 245 lb 14.4 oz (111.5 kg)      LABORATORY DATA:  I have reviewed the data as listed    Latest Ref Rng & Units 12/02/2021    9:26 AM 11/11/2021    9:01 AM 10/21/2021    9:36 AM  CMP  Glucose 70 - 99 mg/dL 103  106  98   BUN 6 - 20 mg/dL _0 Creatinine 0.44 - 1.00 mg/dL 0.77  0.91  0.60   Sodium 135 - 145 mmol/L 138  134  136   Potassium 3.5 - 5.1 mmol/L 3.7  4.1  3.7   Chloride 98 - 111 mmol/L 104  100  103   CO2 22 - 32 mmol/L _1 Calcium 8.9 - 10.3 mg/dL 9.3  9.7  9.5   Total Protein 6.5 - 8.1 g/dL 8.5  8.3  8.0   Total Bilirubin 0.3 - 1.2 mg/dL 0.5  0.4  0.4   Alkaline Phos 38 - 126 U/L 143  156  113   AST 15 - 41 U/L 42  53  38   ALT 0 - 44 U/L 30  42  26     Lab Results  Component Value Date   WBC 4.0 12/23/2021   HGB 10.6 (L) 12/23/2021   HCT 32.8 (L) 12/23/2021   MCV 77.4 (L) 12/23/2021   PLT 269 12/23/2021   NEUTROABS 1.9 12/23/2021    ASSESSMENT & PLAN:  Malignant neoplasm of upper-outer quadrant of right breast in female, estrogen receptor positive ([place]) 12/08/2020:21-week pregnancy: MRI at [facility] noncontrast revealed right axillary mass 3.7 cm, subpectoral lymph nodes, left supraclavicular lymph node, multiple level 2 and 3 lymph nodes in the right axilla, 2 breast masses 1.1 cm largest size.  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  Neoadjuvant chemotherapy with Adriamycin and Cytoxan every 3 weeks x4 started 12/24/2020 completed 02/23/2021 2. delivery of the baby 3.  Continue neoadjuvant therapy with Taxol Herceptin and Perjeta (Taxol completed 06/09/21) 4.  Mastectomy with ALND:  07/05/21: 8 mm residual HG IDC with LVI, Margins Neg 4.  Kadcyla started 07/29/2021 5.  Adjuvant radiation therapy 08/23/2021 - 10/03/2021 6.  Adjuvant antiestrogen therapy: Zoladex with anastrozole started 10/28/2021 Genetic testing: Negative ---------------------------------------------------------------------------------------------------------------------------------- Current treatment:  1. Kadcyla started 07/29/2021, today is cycle 8 completing treatment 01/12/2022 2. Zoladex with anastrozole started 10/28/2021: Tolerating extremely well without any problems or concerns   I will request interventional radiology for port removal  Kadcyla toxicities:  mild fatigue Anemia: Microcytic: With normal iron studies.  Takes 1 tablet oral iron   Monitoring closely for toxicities   08/09/2021: PET CT scan: Right mastectomy and right axillary lymph node dissection: Postoperative changes and seroma.  No convincing evidence of distant metastatic disease.  Left breast mammogram will be done this week.  Her son will be 4-year-old in February I will see her with a last treatment. Survivorship care plan visit will be in March 2024. [place] minimal residual disease monitoring will be done as well.    Orders Placed This Encounter  Procedures   IR Removal Tun Access W/ Port W/O FL    Standing Status:   Future    Standing Expiration Date:   12/24/2022     Order Specific Question:   Reason for exam:    Answer:   done with chemo    Order Specific Question:   Is the patient pregnant?    Answer:   No    Order Specific Question:   Preferred Imaging Location?    Answer:   [facility]    Order Specific Question:   Release to patient    Answer:   Immediate   The patient has a good understanding of the overall plan. she agrees with it. she will call with any problems that may develop before the next visit here. Total time spent: 30 mins including face to face time and time spent for planning, charting and co-ordination of care   Harriette Ohara, MD 12/23/21    I Gardiner Coins am scribing for Dr. Lindi Adie  I have reviewed the above documentation for accuracy and completeness, and I agree with the above.

## 2021-12-22 ENCOUNTER — Encounter: Payer: Self-pay | Admitting: *Deleted

## 2021-12-23 ENCOUNTER — Inpatient Hospital Stay: Payer: BC Managed Care – PPO | Attending: Hematology and Oncology

## 2021-12-23 ENCOUNTER — Inpatient Hospital Stay (HOSPITAL_BASED_OUTPATIENT_CLINIC_OR_DEPARTMENT_OTHER): Payer: BC Managed Care – PPO | Admitting: Hematology and Oncology

## 2021-12-23 ENCOUNTER — Inpatient Hospital Stay: Payer: BC Managed Care – PPO

## 2021-12-23 VITALS — BP 137/71 | HR 64 | Temp 97.8°F | Resp 18 | Ht 64.0 in | Wt 245.9 lb

## 2021-12-23 VITALS — BP 119/67 | HR 56 | Temp 98.0°F | Resp 18

## 2021-12-23 DIAGNOSIS — Z170 Estrogen receptor positive status [ER+]: Secondary | ICD-10-CM | POA: Diagnosis not present

## 2021-12-23 DIAGNOSIS — Z5111 Encounter for antineoplastic chemotherapy: Secondary | ICD-10-CM | POA: Insufficient documentation

## 2021-12-23 DIAGNOSIS — Z5112 Encounter for antineoplastic immunotherapy: Secondary | ICD-10-CM | POA: Insufficient documentation

## 2021-12-23 DIAGNOSIS — C50411 Malignant neoplasm of upper-outer quadrant of right female breast: Secondary | ICD-10-CM | POA: Insufficient documentation

## 2021-12-23 DIAGNOSIS — Z95828 Presence of other vascular implants and grafts: Secondary | ICD-10-CM

## 2021-12-23 DIAGNOSIS — Z79811 Long term (current) use of aromatase inhibitors: Secondary | ICD-10-CM | POA: Diagnosis not present

## 2021-12-23 LAB — CBC WITH DIFFERENTIAL (CANCER CENTER ONLY)
Abs Immature Granulocytes: 0.01 10*3/uL (ref 0.00–0.07)
Basophils Absolute: 0.1 10*3/uL (ref 0.0–0.1)
Basophils Relative: 1 %
Eosinophils Absolute: 0.1 10*3/uL (ref 0.0–0.5)
Eosinophils Relative: 2 %
HCT: 32.8 % — ABNORMAL LOW (ref 36.0–46.0)
Hemoglobin: 10.6 g/dL — ABNORMAL LOW (ref 12.0–15.0)
Immature Granulocytes: 0 %
Lymphocytes Relative: 35 %
Lymphs Abs: 1.4 10*3/uL (ref 0.7–4.0)
MCH: 25 pg — ABNORMAL LOW (ref 26.0–34.0)
MCHC: 32.3 g/dL (ref 30.0–36.0)
MCV: 77.4 fL — ABNORMAL LOW (ref 80.0–100.0)
Monocytes Absolute: 0.6 10*3/uL (ref 0.1–1.0)
Monocytes Relative: 15 %
Neutro Abs: 1.9 10*3/uL (ref 1.7–7.7)
Neutrophils Relative %: 47 %
Platelet Count: 269 10*3/uL (ref 150–400)
RBC: 4.24 MIL/uL (ref 3.87–5.11)
RDW: 18.8 % — ABNORMAL HIGH (ref 11.5–15.5)
WBC Count: 4 10*3/uL (ref 4.0–10.5)
nRBC: 0 % (ref 0.0–0.2)

## 2021-12-23 LAB — CMP (CANCER CENTER ONLY)
ALT: 29 U/L (ref 0–44)
AST: 48 U/L — ABNORMAL HIGH (ref 15–41)
Albumin: 4.1 g/dL (ref 3.5–5.0)
Alkaline Phosphatase: 139 U/L — ABNORMAL HIGH (ref 38–126)
Anion gap: 6 (ref 5–15)
BUN: 11 mg/dL (ref 6–20)
CO2: 27 mmol/L (ref 22–32)
Calcium: 9.9 mg/dL (ref 8.9–10.3)
Chloride: 104 mmol/L (ref 98–111)
Creatinine: 0.62 mg/dL (ref 0.44–1.00)
GFR, Estimated: >60 mL/min (ref >60)
Glucose, Bld: 95 mg/dL (ref 70–99)
Potassium: 4.2 mmol/L (ref 3.5–5.1)
Sodium: 137 mmol/L (ref 135–145)
Total Bilirubin: 0.5 mg/dL (ref 0.3–1.2)
Total Protein: 8.5 g/dL — ABNORMAL HIGH (ref 6.5–8.1)

## 2021-12-23 MED ORDER — GOSERELIN ACETATE 3.6 MG ~~LOC~~ IMPL
3.6000 mg | DRUG_IMPLANT | Freq: Once | SUBCUTANEOUS | Status: AC
  Administered 2021-12-23: 3.6 mg via SUBCUTANEOUS
  Filled 2021-12-23: qty 3.6

## 2021-12-23 MED ORDER — HEPARIN SOD (PORK) LOCK FLUSH 100 UNIT/ML IV SOLN
500.0000 [IU] | Freq: Once | INTRAVENOUS | Status: AC | PRN
  Administered 2021-12-23: 500 [IU]

## 2021-12-23 MED ORDER — SODIUM CHLORIDE 0.9% FLUSH
10.0000 mL | INTRAVENOUS | Status: DC | PRN
  Administered 2021-12-23: 10 mL

## 2021-12-23 MED ORDER — SODIUM CHLORIDE 0.9 % IV SOLN: Freq: Once | INTRAVENOUS | Status: AC

## 2021-12-23 MED ORDER — DIPHENHYDRAMINE HCL 25 MG PO CAPS
50.0000 mg | ORAL_CAPSULE | Freq: Once | ORAL | Status: AC
  Administered 2021-12-23: 50 mg via ORAL
  Filled 2021-12-23: qty 2

## 2021-12-23 MED ORDER — SODIUM CHLORIDE 0.9 % IV SOLN
3.6000 mg/kg | Freq: Once | INTRAVENOUS | Status: AC
  Administered 2021-12-23: 420 mg via INTRAVENOUS
  Filled 2021-12-23: qty 16

## 2021-12-23 MED ORDER — PROCHLORPERAZINE MALEATE 10 MG PO TABS
10.0000 mg | ORAL_TABLET | Freq: Once | ORAL | Status: AC
  Administered 2021-12-23: 10 mg via ORAL
  Filled 2021-12-23: qty 1

## 2021-12-23 MED ORDER — SODIUM CHLORIDE 0.9% FLUSH
10.0000 mL | Freq: Once | INTRAVENOUS | Status: AC
  Administered 2021-12-23: 10 mL

## 2021-12-23 MED ORDER — ACETAMINOPHEN 325 MG PO TABS
650.0000 mg | ORAL_TABLET | Freq: Once | ORAL | Status: AC
  Administered 2021-12-23: 650 mg via ORAL
  Filled 2021-12-23: qty 2

## 2021-12-23 NOTE — Progress Notes (Signed)
Pt declined 30 min post Kadcyla observation. VSS at discharge.

## 2021-12-23 NOTE — Patient Instructions (Addendum)
[facility] ONCOLOGY  Discharge Instructions: Thank you for choosing [facility] to provide your oncology and hematology care.   If you have a lab appointment with the [facility], please go directly to the [facility] and check in at the registration area.   Wear comfortable clothing and clothing appropriate for easy access to any Portacath or PICC line.   We strive to give you quality time with your provider. You may need to reschedule your appointment if you arrive late (15 or more minutes).  Arriving late affects you and other patients whose appointments are after yours.  Also, if you miss three or more appointments without notifying the office, you may be dismissed from the clinic at the provider's discretion.      For prescription refill requests, have your pharmacy contact our office and allow 72 hours for refills to be completed.    Today you received the following chemotherapy and/or immunotherapy agents: ado-trastuzumab emtansine  (Kadcyla)   To help prevent nausea and vomiting after your treatment, we encourage you to take your nausea medication as directed.  BELOW ARE SYMPTOMS THAT SHOULD BE REPORTED IMMEDIATELY: *FEVER GREATER THAN 100.4 F (38 C) OR HIGHER *CHILLS OR SWEATING *NAUSEA AND VOMITING THAT IS NOT CONTROLLED WITH YOUR NAUSEA MEDICATION *UNUSUAL SHORTNESS OF BREATH *UNUSUAL BRUISING OR BLEEDING *URINARY PROBLEMS (pain or burning when urinating, or frequent urination) *BOWEL PROBLEMS (unusual diarrhea, constipation, pain near the anus) TENDERNESS IN MOUTH AND THROAT WITH OR WITHOUT PRESENCE OF ULCERS (sore throat, sores in mouth, or a toothache) UNUSUAL RASH, SWELLING OR PAIN  UNUSUAL VAGINAL DISCHARGE OR ITCHING   Items with * indicate a potential emergency and should be followed up as soon as possible or go to the Emergency Department if any problems should occur.  Please show the CHEMOTHERAPY ALERT CARD or IMMUNOTHERAPY  ALERT CARD at check-in to the Emergency Department and triage nurse.  Should you have questions after your visit or need to cancel or reschedule your appointment, please contact [facility]  Dept: [phone]  and follow the prompts.  Office hours are 8:00 a.m. to 4:30 p.m. Monday - Friday. Please note that voicemails left after 4:00 p.m. may not be returned until the following business day.  We are closed weekends and major holidays. You have access to a nurse at all times for urgent questions. Please call the main number to the clinic Dept: [phone] and follow the prompts.   For any non-urgent questions, you may also contact your provider using MyChart. We now offer e-Visits for anyone 62 and older to request care online for non-urgent symptoms. For details visit [email or web address].   Also download the MyChart app! Go to the app store, search "MyChart", open the app, select [facility], and log in with your MyChart username and password.  Masks are optional in the cancer centers. If you would like for your care team to wear a mask while they are taking care of you, please let them know. You may have one support person who is at least 34 years old accompany you for your appointments.  Goserelin Implant What is this medication? GOSERELIN (GOE se rel in) treats prostate cancer and breast cancer. It works by decreasing levels of the hormones testosterone and estrogen in the body. This prevents prostate and breast cancer cells from spreading or growing. It may also be used to treat endometriosis. This is a condition where the tissue that lines the uterus  grows outside the uterus. It works by decreasing the amount of estrogen your body makes, which reduces heavy bleeding and pain. It can also be used to help thin the lining of the uterus before a surgery used to prevent or reduce heavy periods. This medicine may be used for other purposes; ask your health care  provider or pharmacist if you have questions. COMMON BRAND NAME(S): Zoladex, Zoladex 66-Month What should I tell my care team before I take this medication? They need to know if you have any of these conditions: Bone problems Diabetes Heart disease History of irregular heartbeat or rhythm An unusual or allergic reaction to goserelin, other medications, foods, dyes, or preservatives Pregnant or trying to get pregnant Breastfeeding How should I use this medication? This medication is injected under the skin. It is given by your care team in a hospital or clinic setting. Talk to your care team about the use of this medication in children. Special care may be needed. Overdosage: If you think you have taken too much of this medicine contact a poison control center or emergency room at once. NOTE: This medicine is only for you. Do not share this medicine with others. What if I miss a dose? Keep appointments for follow-up doses. It is important not to miss your dose. Call your care team if you are unable to keep an appointment. What may interact with this medication? Do not take this medication with any of the following: Cisapride Dronedarone Pimozide Thioridazine This medication may also interact with the following: Other medications that cause heart rhythm changes This list may not describe all possible interactions. Give your health care provider a list of all the medicines, herbs, non-prescription drugs, or dietary supplements you use. Also tell them if you smoke, drink alcohol, or use illegal drugs. Some items may interact with your medicine. What should I watch for while using this medication? Visit your care team for regular checks on your progress. Your symptoms may appear to get worse during the first weeks of this therapy. Tell your care team if your symptoms do not start to get better or if they get worse after this time. Using this medication for a long time may weaken your bones. If  you smoke or frequently drink alcohol you may increase your risk of bone loss. A family history of osteoporosis, chronic use of medications for seizures (convulsions), or corticosteroids can also increase your risk of bone loss. The risk of bone fractures may be increased. Talk to your care team about your bone health. This medication may increase blood sugar. The risk may be higher in patients who already have diabetes. Ask your care team what you can do to lower your risk of diabetes while taking this medication. This medication should stop regular monthly menstruation in women. Tell your care team if you continue to menstruate. Talk to your care team if you wish to become pregnant or think you might be pregnant. This medication can cause serious birth defects if taken during pregnancy or for 12 weeks after stopping treatment. Talk to your care team about reliable forms of contraception. Do not breastfeed while taking this medication. This medication may cause infertility. Talk to your care team if you are concerned about your fertility. What side effects may I notice from receiving this medication? Side effects that you should report to your care team as soon as possible: Allergic reactions--skin rash, itching, hives, swelling of the face, lips, tongue, or throat Change in the amount of  urine Heart attack--pain or tightness in the chest, shoulders, arms, or jaw, nausea, shortness of breath, cold or clammy skin, feeling faint or lightheaded Heart rhythm changes--fast or irregular heartbeat, dizziness, feeling faint or lightheaded, chest pain, trouble breathing High blood sugar (hyperglycemia)--increased thirst or amount of urine, unusual weakness or fatigue, blurry vision High calcium level--increased thirst or amount of urine, nausea, vomiting, confusion, unusual weakness or fatigue, bone pain Pain, redness, irritation, or bruising at the injection site Severe back pain, numbness or weakness of the  hands, arms, legs, or feet, loss of coordination, loss of bowel or bladder control Stroke--sudden numbness or weakness of the face, arm, or leg, trouble speaking, confusion, trouble walking, loss of balance or coordination, dizziness, severe headache, change in vision Swelling and pain of the tumor site or lymph nodes Trouble passing urine Side effects that usually do not require medical attention (report to your care team if they continue or are bothersome): Change in sex drive or performance Headache Hot flashes Rapid or extreme change in emotion or mood Sweating Swelling of the ankles, hands, or feet Unusual vaginal discharge, itching, or odor This list may not describe all possible side effects. Call your doctor for medical advice about side effects. You may report side effects to FDA at 1-800-FDA-1088. Where should I keep my medication? This medication is given in a hospital or clinic. It will not be stored at home. NOTE: This sheet is a summary. It may not cover all possible information. If you have questions about this medicine, talk to your doctor, pharmacist, or health care provider.  2023 Elsevier/Gold Standard (2007-03-02 00:00:00)

## 2021-12-23 NOTE — Assessment & Plan Note (Addendum)
12/08/2020:21-week pregnancy: MRI at [facility] noncontrast revealed right axillary mass 3.7 cm, subpectoral lymph nodes, left supraclavicular lymph node, multiple level 2 and 3 lymph nodes in the right axilla, 2 breast masses 1.1 cm largest size.  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  Neoadjuvant chemotherapy with Adriamycin and Cytoxan every 3 weeks x4 started 12/24/2020 completed 02/23/2021 2. delivery of the baby 3.  Continue neoadjuvant therapy with Taxol Herceptin and Perjeta (Taxol completed 06/09/21) 4.  Mastectomy with ALND:  07/05/21: 8 mm residual HG IDC with LVI, [facility] Neg 4.  Kadcyla started 07/29/2021 5.  Adjuvant radiation therapy 08/23/2021 - 10/03/2021 6.  Adjuvant antiestrogen therapy: Zoladex with anastrozole started 10/28/2021 Genetic testing: Negative ---------------------------------------------------------------------------------------------------------------------------------- Current treatment:  1. Kadcyla started 07/29/2021, today is cycle 8 completing treatment 01/12/2022 2. Zoladex with anastrozole started 10/28/2021: Tolerating extremely well without any problems or concerns   I will request interventional radiology for port removal  Kadcyla toxicities:  mild fatigue Anemia: Microcytic: With normal iron studies.  Takes 1 tablet oral iron   Monitoring closely for toxicities   08/09/2021: PET CT scan: Right mastectomy and right axillary lymph node dissection: Postoperative changes and seroma.  No convincing evidence of distant metastatic disease.  Left breast mammogram will be done this  week.  Her son will be 25-year-old in February I will see her with a last treatment. Survivorship care plan visit will be in March 2024. Signatera minimal residual disease monitoring will be done as well.

## 2021-12-28 ENCOUNTER — Encounter: Payer: Self-pay | Admitting: Hematology and Oncology

## 2021-12-28 DIAGNOSIS — Z170 Estrogen receptor positive status [ER+]: Secondary | ICD-10-CM

## 2021-12-30 ENCOUNTER — Encounter: Payer: Self-pay | Admitting: Hematology and Oncology

## 2021-12-30 ENCOUNTER — Telehealth: Payer: Self-pay

## 2021-12-30 ENCOUNTER — Other Ambulatory Visit: Payer: Self-pay

## 2021-12-30 NOTE — Telephone Encounter (Signed)
Per md orders entered for signatera. Requisition and all supported documents faxed to [phone]. Faxed confirmation was received.  

## 2022-01-04 ENCOUNTER — Ambulatory Visit: Payer: BC Managed Care – PPO | Attending: Internal Medicine

## 2022-01-04 ENCOUNTER — Other Ambulatory Visit: Payer: Self-pay

## 2022-01-04 DIAGNOSIS — Z5181 Encounter for therapeutic drug level monitoring: Secondary | ICD-10-CM | POA: Diagnosis not present

## 2022-01-04 DIAGNOSIS — Z79899 Other long term (current) drug therapy: Secondary | ICD-10-CM

## 2022-01-04 LAB — ECHOCARDIOGRAM LIMITED
Area-P 1/2: 3.43 cm2
S' Lateral: 3.3 cm

## 2022-01-05 ENCOUNTER — Encounter: Payer: Self-pay | Admitting: Internal Medicine

## 2022-01-05 ENCOUNTER — Ambulatory Visit (INDEPENDENT_AMBULATORY_CARE_PROVIDER_SITE_OTHER): Payer: BC Managed Care – PPO | Admitting: Internal Medicine

## 2022-01-05 VITALS — BP 96/68 | HR 69 | Ht 64.0 in | Wt 241.8 lb

## 2022-01-05 DIAGNOSIS — Z79899 Other long term (current) drug therapy: Secondary | ICD-10-CM | POA: Diagnosis not present

## 2022-01-05 DIAGNOSIS — Z5181 Encounter for therapeutic drug level monitoring: Secondary | ICD-10-CM

## 2022-01-05 LAB — BRAIN NATRIURETIC PEPTIDE: BNP: 7.6 pg/mL (ref 0.0–100.0)

## 2022-01-05 NOTE — Progress Notes (Signed)
Cardiology Office Note:    Date:  01/05/2022   ID:  Evelyn Marshall, DOB 04/29/1985, MRN [id number]  PCP:  Smith, Karla, MD   [facility] Providers Cardiologist:  Branch, Mary E, MD     Referring MD: Smith, Karla, MD   No chief complaint on file. Cardiotoxic Breast Cancer therapy  History of Present Illness:    Evelyn Marshall is a 36 y.o. female with a hx of Stage IV right breast cancer, HTN, hx preeclampsia referral for cardiotoxicity surveillance while on chemotherapy  She recently delivered a baby boy 03/15/2021 with induction of labor at 34 weeks. She had hypertension during pregnancy and hx of preeclampsia. She was managed with magnesium sulfate and labetalol.  She checks her blood pressures at home. Most recent was 118/70 mmHg. She denies CP, shortness of breath. She denies PND, orthopnea, and no LE edema. She did a lot of walking after the delivery.  She has hx of Stage IV ER+ , HER2+ breast cancer RUQ.  AC Q21 days/Trastuzumab + Pertuzumab+ Weekly Paclitaxel. She was diagnosed November, 16th 2022. She was pregnant. Started with AC. She started anti-her2 recently post delivery day 1 cycle 1 03/25/2021. She is planned for adjuvant radiation on the right and adjuvant antiestrogen therapy with ovarian function suppression. Notably she had proteinuria during her pregnancy  Family Hx: Father had hx of congestive HF and on LVAD HM III. Still smoking so not on transplant list. She notes hx of hypertension. Sibling with diabetes.   Social Hx: She has four boys. She is on medical leave. She's a teacher, teaching special ed. No smoking hx. No etoh use  Interim Hx: She has a BP cuff at home. She notes her blood pressures are more stable.Typically SBP is < 130. She denies  SOB, PND or orthopnea, no LE edema. She is planned for right mastectomy soon with Dr. Wakefield.  Interim Hx 01/05/2022 Noted that she had hypothyroid found at [facility], started on synthroid. She is working  on weight loss.   Past Medical History:  Diagnosis Date   Abnormal Pap smear 2012   colpo result benign   Cancer (HCC)    Chlamydia    Family history of breast cancer 12/15/2020   History of pre-eclampsia    HPV (human papilloma virus) infection    Hyperemesis    Hypokalemia    Pregnancy induced hypertension    Ptyalism    Vaginal Pap smear, abnormal     Past Surgical History:  Procedure Laterality Date   BREAST REDUCTION SURGERY Left 07/05/2021   Procedure: LEFT MAMMARY REDUCTION  (BREAST);  Surgeon: Thimmappa, Brinda, MD;  Location: [facility];  Service: Plastics;  Laterality: Left;   COLPOSCOPY     IR IMAGING GUIDED PORT INSERTION  12/24/2020   NODE DISSECTION Right 07/05/2021   Procedure: RIGHT AXILLARY NODE DISSECTION;  Surgeon: Wakefield, Matthew, MD;  Location: [facility];  Service: General;  Laterality: Right;   SIMPLE MASTECTOMY WITH AXILLARY SENTINEL NODE BIOPSY Right 07/05/2021   Procedure: RIGHT MASTECTOMY;  Surgeon: Wakefield, Matthew, MD;  Location:  SURGERY CENTER;  Service: General;  Laterality: Right;  GEN & PEC BLOCK   WISDOM TOOTH EXTRACTION      Current Medications: Current Meds  Medication Sig   anastrozole (ARIMIDEX) 1 MG tablet Take 1 tablet (1 mg total) by mouth daily.   carvedilol (COREG) 25 MG tablet Take 1 tablet (25 mg total) by mouth 2 (two) times daily.     ibuprofen (ADVIL) 600 MG tablet Take 600 mg by mouth every 6 (six) hours as needed.   levothyroxine (SYNTHROID) 75 MCG tablet Take 75 mcg by mouth every morning.   lidocaine-prilocaine (EMLA) cream Apply 1 application. topically as needed (apply once weekly prior to port a cath needle insertion.).   losartan (COZAAR) 25 MG tablet TAKE 2 TABLETS(50 MG) BY MOUTH DAILY   ondansetron (ZOFRAN) 8 MG tablet Take 1 tablet (8 mg total) by mouth every 8 (eight) hours as needed for nausea or vomiting.     Allergies:   Patient has no known allergies.   Social History    Socioeconomic History   Marital status: Married    Spouse name: Belenda Cruise   Number of children: 3   Years of education: 16   Highest education level: Bachelor's degree (e.g., BA, AB, BS)  Occupational History   Occupation: Pharmacist, hospital  Tobacco Use   Smoking status: Never   Smokeless tobacco: Never  Vaping Use   Vaping Use: Never used  Substance and Sexual Activity   Alcohol use: No   Drug use: No   Sexual activity: Yes  Other Topics Concern   Not on file  Social History Narrative   Not on file   Social Determinants of Health   Financial Resource Strain: Not on file  Food Insecurity: Not on file  Transportation Needs: Not on file  Physical Activity: Not on file  Stress: Not on file  Social Connections: Not on file     Family History: The patient's family history includes Breast cancer in her paternal grandmother; Breast cancer (age of onset: 68) in her cousin; Heart disease in her father; Hypertension in her father; Stroke in her father and paternal grandmother.  ROS:   Please see the history of present illness.      All other systems reviewed and are negative.  EKGs/Labs/Other Studies Reviewed:    The following studies were reviewed today:   EKG:  EKG is  ordered today.  The ekg ordered today demonstrates   07/04/2021-NSR, Qtc 426m   Recent Labs: 03/29/2021: BNP 8.2 08/12/2021: Magnesium 1.7 12/23/2021: ALT 29; BUN 11; Creatinine 0.62; Hemoglobin 10.6; Platelet Count 269; Potassium 4.2; Sodium 137   Recent Lipid Panel No results found for: "CHOL", "TRIG", "HDL", "CHOLHDL", "VLDL", "LDLCALC", "LDLDIRECT"   Risk Assessment/Calculations:           Physical Exam:    VS:    Vitals:   01/05/22 1019  BP: 96/68  Pulse: 69  SpO2: 96%     Wt Readings from Last 3 Encounters:  01/05/22 241 lb 12.8 oz (109.7 kg)  12/23/21 245 lb 14.4 oz (111.5 kg)  12/02/21 250 lb 9.6 oz (113.7 kg)     GEN:  Well nourished, well developed in no acute distress HEENT:  Normal NECK: No JVD;  LYMPHATICS: No lymphadenopathy CARDIAC: RRR, no murmurs, rubs, gallops RESPIRATORY:  Clear to auscultation without rales, wheezing or rhonchi  ABDOMEN:  non-distended MUSCULOSKELETAL:  No edema; No deformity  SKIN: Warm and dry NEUROLOGIC:  Alert and oriented x 3 PSYCHIATRIC:  Normal affect   ASSESSMENT:    #HTN: She had reduced strain on echo; changed her regimen to cardio-protective agents while on trastuzumab. Blood pressures are well controlled today 116/82 mmHg - s/p labetalol, nifedipine 30 mg daily - continue losartan 50 mg daily and carvedilol 25 mg BID  #Hypothyroidism: on levothyroxine 0.75 mg daily  #Weightloss: discussed health tracker. She was recommended to do 1600 calorie. Avoid  carbs/sugars. Intermittent fasting. Group/sponsor weight loss help, exercise class/ Goes to [facility]  #Cardio Oncology R BC; HER2+ BC; planned for AC and trastuzumab therapy; zoladex with anastrozole 10/28/2021 There is ~3% risk of clinical heart failure and ~ 20 % risk of asymptomatic cardiac dysfunction on AC and herceptin. Further, she may be at increased risk with  family hx of CHF and pre-eclampsia. Will aim for Bps closer to 120/80 mmHg. She is on cardio-protective agents. Low risk for radiation cardiotoxicity ( R sided) Chemo AC initiation : 12/24/2020- 02/23/2021 Cumulative Dose: 220 mg/m2 Anti-HER2 Initiation: 03/25/2021- switched to kadcyla in July Surveillance:  - baseline echo- normal, mild LVH - BNP normal in March -  follow up echo in 3 months 06/2021 showed reduced GLS -16 from -21.4%;  continued on losartan 50 mg daily from 25 mg daily. - 12/132023: TTE - EF 55-60%, -21.4% (improved) - FU TTE 03/2022 - will do 12 M FU TTE   PLAN:    In order of problems listed above:  Limited TTE with GLS, 03/2022  BNP pending, got today Follow up 6 months      Medication Adjustments/Labs and Tests Ordered: Current medicines are reviewed at length with the  patient today.  Concerns regarding medicines are outlined above.  Orders Placed This Encounter  Procedures   ECHOCARDIOGRAM LIMITED   No orders of the defined types were placed in this encounter.   Patient Instructions  Medication Instructions:  Your physician recommends that you continue on your current medications as directed. Please refer to the Current Medication list given to you today.  *If you need a refill on your cardiac medications before your next appointment, please call your pharmacy*   Lab Work: NONE If you have labs (blood work) drawn today and your tests are completely normal, you will receive your results only by: MyChart Message (if you have MyChart) OR A paper copy in the mail If you have any lab test that is abnormal or we need to change your treatment, we will call you to review the results.   Testing/Procedures: Your physician has requested that you have an echocardiogram. Echocardiography is a painless test that uses sound waves to create images of your heart. It provides your doctor with information about the size and shape of your heart and how well your heart's chambers and valves are working. This procedure takes approximately one hour. There are no restrictions for this procedure. Please do NOT wear cologne, perfume, aftershave, or lotions (deodorant is allowed). Please arrive 15 minutes prior to your appointment time.    Follow-Up: At [facility], you and your health needs are our priority.  As part of our continuing mission to provide you with exceptional heart care, we have created designated Provider Care Teams.  These Care Teams include your primary Cardiologist (physician) and Advanced Practice Providers (APPs -  Physician Assistants and Nurse Practitioners) who all work together to provide you with the care you need, when you need it.  We recommend signing up for the patient portal called "MyChart".  Sign up information is provided on this  After Visit Summary.  MyChart is used to connect with patients for Virtual Visits (Telemedicine).  Patients are able to view lab/test results, encounter notes, upcoming appointments, etc.  Non-urgent messages can be sent to your provider as well.   To learn more about what you can do with MyChart, go to [email or web address].    Your next appointment:   6 month(s)  The format   for your next appointment:   In Person  Provider:   Branch, Mary E, MD     Signed, Branch, Mary E, MD  01/05/2022 12:42 PM    [facility] 

## 2022-01-05 NOTE — Patient Instructions (Signed)
Medication Instructions:  Your physician recommends that you continue on your current medications as directed. Please refer to the Current Medication list given to you today.  *If you need a refill on your cardiac medications before your next appointment, please call your pharmacy*   Lab Work: NONE If you have labs (blood work) drawn today and your tests are completely normal, you will receive your results only by: Isabel (if you have MyChart) OR A paper copy in the mail If you have any lab test that is abnormal or we need to change your treatment, we will call you to review the results.   Testing/Procedures: Your physician has requested that you have an echocardiogram. Echocardiography is a painless test that uses sound waves to create images of your heart. It provides your doctor with information about the size and shape of your heart and how well your heart's chambers and valves are working. This procedure takes approximately one hour. There are no restrictions for this procedure. Please do NOT wear cologne, perfume, aftershave, or lotions (deodorant is allowed). Please arrive 15 minutes prior to your appointment time.    Follow-Up: At [facility], you and your health needs are our priority.  As part of our continuing mission to provide you with exceptional heart care, we have created designated Provider Care Teams.  These Care Teams include your primary Cardiologist (physician) and Advanced Practice Providers (APPs -  Physician Assistants and Nurse Practitioners) who all work together to provide you with the care you need, when you need it.  We recommend signing up for the patient portal called "MyChart".  Sign up information is provided on this After Visit Summary.  MyChart is used to connect with patients for Virtual Visits (Telemedicine).  Patients are able to view lab/test results, encounter notes, upcoming appointments, etc.  Non-urgent messages can be sent to your  provider as well.   To learn more about what you can do with MyChart, go to [email or web address].    Your next appointment:   6 month(s)  The format for your next appointment:   In Person  Provider:   Janina Mayo, MD

## 2022-01-06 ENCOUNTER — Other Ambulatory Visit: Payer: Self-pay

## 2022-01-06 ENCOUNTER — Encounter: Payer: BC Managed Care – PPO | Admitting: Adult Health

## 2022-01-09 ENCOUNTER — Ambulatory Visit: Payer: BC Managed Care – PPO | Attending: General Surgery

## 2022-01-09 VITALS — Wt 243.5 lb

## 2022-01-09 DIAGNOSIS — Z483 Aftercare following surgery for neoplasm: Secondary | ICD-10-CM | POA: Insufficient documentation

## 2022-01-09 NOTE — Therapy (Signed)
OUTPATIENT PHYSICAL THERAPY SOZO SCREENING NOTE   Patient Name: Evelyn Marshall MRN: [id number] DOB:[birth year], 36 y.o., female Today's Date: 01/09/2022  PCP: [name], MD REFERRING PROVIDER: Rolm Bookbinder, MD   PT End of Session - 01/09/22 [phone]     Visit Number 2   # unchanged due to screen only   PT Start Time 4481    PT Stop Time 8563    PT Time Calculation (min) 4 min    Activity Tolerance Patient tolerated treatment well    Behavior During Therapy [facility] for tasks assessed/performed             Past Medical History:  Diagnosis Date   Abnormal Pap smear 2012   colpo result benign   Cancer ([place])    Chlamydia    Family history of breast cancer 12/15/2020   History of pre-eclampsia    HPV (human papilloma virus) infection    Hyperemesis    Hypokalemia    Pregnancy induced hypertension    Ptyalism    Vaginal Pap smear, abnormal    Past Surgical History:  Procedure Laterality Date   BREAST REDUCTION SURGERY Left 07/05/2021   Procedure: LEFT MAMMARY REDUCTION  (BREAST);  Surgeon: Irene Limbo, MD;  Location: [place];  Service: Plastics;  Laterality: Left;   COLPOSCOPY     IR IMAGING GUIDED PORT INSERTION  12/24/2020   NODE DISSECTION Right 07/05/2021   Procedure: RIGHT AXILLARY NODE DISSECTION;  Surgeon: Rolm Bookbinder, MD;  Location: [place];  Service: General;  Laterality: Right;   SIMPLE MASTECTOMY WITH AXILLARY SENTINEL NODE BIOPSY Right 07/05/2021   Procedure: RIGHT MASTECTOMY;  Surgeon: Rolm Bookbinder, MD;  Location: [place];  Service: General;  Laterality: Right;  [place] EXTRACTION     Patient Active Problem List   Diagnosis Date Noted   S/P mastectomy, right 07/05/2021   Postpartum care following vaginal delivery 03/16/2021   Malignant neoplasm affecting pregnancy in third trimester 03/15/2021   Malignant neoplasm affecting pregnancy, antepartum, third trimester  03/13/2021   Port-A-Cath in place 12/31/2020   Genetic testing 12/27/2020   Malignant neoplasm of upper-outer quadrant of right breast in female, estrogen receptor positive ([place]) 12/15/2020   Family history of breast cancer 12/15/2020   Severe preeclampsia, third trimester 02/25/2019   Hyperemesis gravidarum 08/20/2018   Preeclampsia, third trimester 11/15/2015   Pre-eclampsia during pregnancy in third trimester, antepartum 11/15/2015   Preeclampsia 11/15/2015   Mild preeclampsia 11/12/2015   Hypokalemia, inadequate intake 06/17/2015   Ptyalism 05/18/2015   Chronic hypertension in pregnancy 05/18/2015   Morbid obesity with BMI of 40.0-44.9, adult ([place]) 05/18/2015   Rh negative, maternal 05/18/2015    REFERRING DIAG: right breast cancer at risk for lymphedema  THERAPY DIAG: Aftercare following surgery for neoplasm  PERTINENT HISTORY: She is now post op Right mastectomy on 07/05/2021 with a left Breast reduction. 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 This was a grade 3. The largest remaining tumor was 8 mm and it was multifocal. There were 24 lymph nodes all removed  with an axillary lymph node dissection. 5 of these had macrometastases present in them. This was a less than 1% ER positive, PR negative, HER2 positive tumor. She is presently undergoing radiation and will have 31 treatments  PRECAUTIONS: right UE Lymphedema risk, None  SUBJECTIVE: Pt returns for her first 3 month L-Dex screen.  PAIN:  Are you having pain? No  SOZO SCREENING: Patient was assessed today using the SOZO machine to determine the lymphedema index score. This was compared to her baseline score. It was determined that she is within the recommended range when compared to her baseline and no further action is needed at this time. She will continue SOZO screenings. These are done every 3 months for 2 years post operatively  followed by every 6 months for 2 years, and then annually.  Also answered pts questions regarding when she should wear her compression sleeve and for how long.    L-DEX FLOWSHEETS - 01/09/22 0800       L-DEX LYMPHEDEMA SCREENING   Measurement Type Unilateral    L-DEX MEASUREMENT EXTREMITY Upper Extremity    POSITION  Standing    DOMINANT SIDE Right    At Risk Side Right    BASELINE SCORE (UNILATERAL) 4.2    L-DEX SCORE (UNILATERAL) 7.8    VALUE CHANGE (UNILAT) 3.6              Otelia Limes, PTA 01/09/2022, 8:57 AM

## 2022-01-12 NOTE — Progress Notes (Signed)
Patient Care Team: [name], MD as PCP - General (Family Medicine) Janina Mayo, MD as PCP - Cardiology (Cardiology) Nicholas Lose, MD as Consulting Physician (Hematology and Oncology) Servando Salina, MD as Consulting Physician (Obstetrics and Gynecology) Irene Limbo, MD as Consulting Physician (Plastic Surgery) Rolm Bookbinder, MD as Consulting Physician (General Surgery)  DIAGNOSIS:  Encounter Diagnosis  Name Primary?   Malignant neoplasm of upper-outer quadrant of right breast in female, estrogen receptor positive ([place]) Yes    SUMMARY OF ONCOLOGIC HISTORY: Oncology History  Malignant neoplasm of upper-outer quadrant of right breast in female, estrogen receptor positive ([place])  12/08/2020 Initial Diagnosis   21-week pregnancy: MRI at [facility] noncontrast revealed right axillary mass 3.7 cm, subpectoral lymph nodes, left supraclavicular lymph node, multiple level 2 and 3 lymph nodes in the right axilla, 2 breast masses 1.1 cm largest size.  Mammogram in [place] revealed 3 small masses at 12:00 1.2 cm, 0.6 cm, 0.9 cm and 5 abnormal axillary lymph nodes, pleomorphic calcifications measuring 10 cm, biopsy revealed grade 2 IDC with necrosis and calcifications ER 30%, PR 0%, HER2 positive, Ki-67 30 to 40%   12/15/2020 Cancer Staging   Staging form: Breast, AJCC 8th Edition - Clinical: Stage IV (cT3, cN2, pM1, G2, ER+, PR-, HER2+) - Signed by Nicholas Lose, MD on 12/15/2020 Histologic grading system: 3 grade system   12/24/2020 - 02/23/2021 Chemotherapy   Patient is on Treatment Plan : BREAST Adjuvant AC q21d     12/29/2020 Genetic Testing   Negative hereditary cancer genetic testing: no pathogenic variants detected in [facility] BRCAPlus Panel or [employer] CustomNext-Cancer +RNAinsight Panel.  The report dates are 12/23/2020 and 12/29/2020.   The BRCAplus panel offered by [employer] and includes sequencing and deletion/duplication analysis for the following 8 genes: ATM, BRCA1,  BRCA2, CDH1, CHEK2, PALB2, PTEN, and TP53.  The CustomNext-Cancer+RNAinsight panel offered by [facility] includes sequencing and rearrangement analysis for the following 47 genes:  APC, ATM, AXIN2, BARD1, BMPR1A, BRCA1, BRCA2, BRIP1, CDH1, CDK4, CDKN2A, CHEK2, DICER1, EPCAM, GREM1, HOXB13, MEN1, MLH1, MSH2, MSH3, MSH6, MUTYH, NBN, NF1, NF2, NTHL1, PALB2, PMS2, POLD1, POLE, PTEN, RAD51C, RAD51D, RECQL, RET, SDHA, SDHAF2, SDHB, SDHC, SDHD, SMAD4, SMARCA4, STK11, TP53, TSC1, TSC2, and VHL.  RNA data is routinely analyzed for use in variant interpretation for all genes.   03/25/2021 - 06/30/2021 Chemotherapy   Patient is on Treatment Plan : BREAST AC q21 days / Trastuzumab + Pertuzumab + Weekly Paclitaxel q21d     07/29/2021 - 09/09/2021 Chemotherapy   Patient is on Treatment Plan : BREAST ADO-Trastuzumab Emtansine (Kadcyla) q21d     07/29/2021 -  Chemotherapy   Patient is on Treatment Plan : BREAST ADO-Trastuzumab Emtansine (Kadcyla) q21d       CHIEF COMPLIANT: Follow up on Kadycyla  INTERVAL HISTORY: Evelyn Marshall is a 36 y.o. female with right breast cancer currently on Kadcyla. She presents to the clinic today for last treatment.   ALLERGIES:  has No Known Allergies.  MEDICATIONS:  Current Outpatient Medications  Medication Sig Dispense Refill   amoxicillin (AMOXIL) 500 MG capsule      anastrozole (ARIMIDEX) 1 MG tablet Take 1 tablet (1 mg total) by mouth daily. 90 tablet 3   carvedilol (COREG) 25 MG tablet Take 1 tablet (25 mg total) by mouth 2 (two) times daily. 180 tablet 3   ibuprofen (ADVIL) 600 MG tablet Take 600 mg by mouth every 6 (six) hours as needed.     levothyroxine (SYNTHROID) 75 MCG tablet  Take 75 mcg by mouth every morning.     lidocaine-prilocaine (EMLA) cream Apply 1 application. topically as needed (apply once weekly prior to port a cath needle insertion.). 30 g 0   losartan (COZAAR) 25 MG tablet TAKE 2 TABLETS(50 MG) BY MOUTH DAILY 180 tablet 1   ondansetron (ZOFRAN) 8 MG  tablet Take 1 tablet (8 mg total) by mouth every 8 (eight) hours as needed for nausea or vomiting. 20 tablet 1   No current facility-administered medications for this visit.    PHYSICAL EXAMINATION: ECOG PERFORMANCE STATUS: 1 - Symptomatic but completely ambulatory  Vitals:   01/13/22 0944  BP: 114/65  Pulse: 68  Resp: 16  Temp: 97.8 F (36.6 C)  SpO2: 98%   Filed Weights   01/13/22 0944  Weight: 240 lb (108.9 kg)      LABORATORY DATA:  I have reviewed the data as listed    Latest Ref Rng & Units 01/13/2022    9:22 AM 12/23/2021    9:49 AM 12/02/2021    9:26 AM  CMP  Glucose 70 - 99 mg/dL 102  95  103   BUN 6 - 20 mg/dL _0 Creatinine 0.44 - 1.00 mg/dL 0.68  0.62  0.77   Sodium 135 - 145 mmol/L 137  137  138   Potassium 3.5 - 5.1 mmol/L 3.8  4.2  3.7   Chloride 98 - 111 mmol/L 102  104  104   CO2 22 - 32 mmol/L _1 Calcium 8.9 - 10.3 mg/dL 9.9  9.9  9.3   Total Protein 6.5 - 8.1 g/dL 8.1  8.5  8.5   Total Bilirubin 0.3 - 1.2 mg/dL 0.6  0.5  0.5   Alkaline Phos 38 - 126 U/L 134  139  143   AST 15 - 41 U/L 73  48  42   ALT 0 - 44 U/L 36  29  30     Lab Results  Component Value Date   WBC 4.2 01/13/2022   HGB 10.5 (L) 01/13/2022   HCT 32.2 (L) 01/13/2022   MCV 77.2 (L) 01/13/2022   PLT 218 01/13/2022   NEUTROABS 2.1 01/13/2022    ASSESSMENT & PLAN:  Malignant neoplasm of upper-outer quadrant of right breast in female, estrogen receptor positive ([place]) 12/08/2020:21-week pregnancy: MRI at [facility] noncontrast revealed right axillary mass 3.7 cm, subpectoral lymph nodes, left supraclavicular lymph node, multiple level 2 and 3 lymph nodes in the right axilla, 2 breast masses 1.1 cm largest size.  Mammogram in [place] revealed 3 small masses at 12:00 1.2 cm, 0.6 cm, 0.9 cm and 5 abnormal axillary lymph nodes, pleomorphic calcifications measuring 10 cm, biopsy revealed grade 2 IDC with necrosis and calcifications ER 30%, PR 0%, HER2 positive, Ki-67 30  to 40%   PET/CT 03/24/2021: Multifocal right breast cancer, hypermetabolic right axillary, subpectoral, bilateral supraclavicular and mediastinal lymph nodes   Treatment plan: 1.  Neoadjuvant chemotherapy with Adriamycin and Cytoxan every 3 weeks x4 started 12/24/2020 completed 02/23/2021 2. delivery of the baby 3.  Continue neoadjuvant therapy with Taxol Herceptin and Perjeta (Taxol completed 06/09/21) 4.  Mastectomy with ALND:  07/05/21: 8 mm residual HG IDC with LVI, Margins Neg 4.  Kadcyla started 07/29/2021 5.  Adjuvant radiation therapy 08/23/2021 - 10/03/2021 6.  Adjuvant antiestrogen therapy: Zoladex with anastrozole started 10/28/2021 Genetic testing: Negative ---------------------------------------------------------------------------------------------------------------------------------- Current treatment:  1. Kadcyla started 07/29/2021, today is cycle 8 completed  treatment 01/13/2022 2. Zoladex with anastrozole started 10/28/2021: Tolerating extremely well without any problems or concerns    Kadcyla toxicities:  mild fatigue Anemia: Microcytic: With normal iron studies.  Takes 1 tablet oral iron   Monitoring closely for toxicities   08/09/2021: PET CT scan: Right mastectomy and right axillary lymph node dissection: Postoperative changes and seroma.  No convincing evidence of distant metastatic disease.   Breast cancer surveillance:  Left breast mammogram 12/24/2021: Benign breast density category B  Her son will be 81-year-old in February  Survivorship care plan visit will be in March 2024. [facility] minimal residual disease monitoring will be done as well. Monthly injection appointments Return to clinic for survivorship care plan visit    No orders of the defined types were placed in this encounter.  The patient has a good understanding of the overall plan. she agrees with it. she will call with any problems that may develop before the next visit here. Total time spent: 30 mins  including face to face time and time spent for planning, charting and co-ordination of care   Harriette Ohara, MD 01/13/22    I Gardiner Coins am acting as a Education administrator for [employer]  I have reviewed the above documentation for accuracy and completeness, and I agree with the above.

## 2022-01-13 ENCOUNTER — Inpatient Hospital Stay: Payer: BC Managed Care – PPO

## 2022-01-13 ENCOUNTER — Inpatient Hospital Stay (HOSPITAL_BASED_OUTPATIENT_CLINIC_OR_DEPARTMENT_OTHER): Payer: BC Managed Care – PPO | Admitting: Hematology and Oncology

## 2022-01-13 VITALS — BP 114/65 | HR 68 | Temp 97.8°F | Resp 16 | Ht 64.0 in | Wt 240.0 lb

## 2022-01-13 DIAGNOSIS — Z170 Estrogen receptor positive status [ER+]: Secondary | ICD-10-CM

## 2022-01-13 DIAGNOSIS — Z95828 Presence of other vascular implants and grafts: Secondary | ICD-10-CM

## 2022-01-13 DIAGNOSIS — Z5112 Encounter for antineoplastic immunotherapy: Secondary | ICD-10-CM | POA: Diagnosis not present

## 2022-01-13 DIAGNOSIS — C50411 Malignant neoplasm of upper-outer quadrant of right female breast: Secondary | ICD-10-CM | POA: Diagnosis not present

## 2022-01-13 LAB — CMP (CANCER CENTER ONLY)
ALT: 36 U/L (ref 0–44)
AST: 73 U/L — ABNORMAL HIGH (ref 15–41)
Albumin: 3.9 g/dL (ref 3.5–5.0)
Alkaline Phosphatase: 134 U/L — ABNORMAL HIGH (ref 38–126)
Anion gap: 6 (ref 5–15)
BUN: 16 mg/dL (ref 6–20)
CO2: 29 mmol/L (ref 22–32)
Calcium: 9.9 mg/dL (ref 8.9–10.3)
Chloride: 102 mmol/L (ref 98–111)
Creatinine: 0.68 mg/dL (ref 0.44–1.00)
GFR, Estimated: >60 mL/min (ref >60)
Glucose, Bld: 102 mg/dL — ABNORMAL HIGH (ref 70–99)
Potassium: 3.8 mmol/L (ref 3.5–5.1)
Sodium: 137 mmol/L (ref 135–145)
Total Bilirubin: 0.6 mg/dL (ref 0.3–1.2)
Total Protein: 8.1 g/dL (ref 6.5–8.1)

## 2022-01-13 LAB — CBC WITH DIFFERENTIAL (CANCER CENTER ONLY)
Abs Immature Granulocytes: 0.01 10*3/uL (ref 0.00–0.07)
Basophils Absolute: 0.1 10*3/uL (ref 0.0–0.1)
Basophils Relative: 1 %
Eosinophils Absolute: 0.1 10*3/uL (ref 0.0–0.5)
Eosinophils Relative: 2 %
HCT: 32.2 % — ABNORMAL LOW (ref 36.0–46.0)
Hemoglobin: 10.5 g/dL — ABNORMAL LOW (ref 12.0–15.0)
Immature Granulocytes: 0 %
Lymphocytes Relative: 34 %
Lymphs Abs: 1.4 10*3/uL (ref 0.7–4.0)
MCH: 25.2 pg — ABNORMAL LOW (ref 26.0–34.0)
MCHC: 32.6 g/dL (ref 30.0–36.0)
MCV: 77.2 fL — ABNORMAL LOW (ref 80.0–100.0)
Monocytes Absolute: 0.5 10*3/uL (ref 0.1–1.0)
Monocytes Relative: 13 %
Neutro Abs: 2.1 10*3/uL (ref 1.7–7.7)
Neutrophils Relative %: 50 %
Platelet Count: 218 10*3/uL (ref 150–400)
RBC: 4.17 MIL/uL (ref 3.87–5.11)
RDW: 18.5 % — ABNORMAL HIGH (ref 11.5–15.5)
WBC Count: 4.2 10*3/uL (ref 4.0–10.5)
nRBC: 0 % (ref 0.0–0.2)

## 2022-01-13 MED ORDER — HEPARIN SOD (PORK) LOCK FLUSH 100 UNIT/ML IV SOLN
500.0000 [IU] | Freq: Once | INTRAVENOUS | Status: AC | PRN
  Administered 2022-01-13: 500 [IU]

## 2022-01-13 MED ORDER — ACETAMINOPHEN 325 MG PO TABS
650.0000 mg | ORAL_TABLET | Freq: Once | ORAL | Status: AC
  Administered 2022-01-13: 650 mg via ORAL
  Filled 2022-01-13: qty 2

## 2022-01-13 MED ORDER — SODIUM CHLORIDE 0.9% FLUSH: 10.0000 mL | Freq: Once | INTRAVENOUS | Status: DC

## 2022-01-13 MED ORDER — SODIUM CHLORIDE 0.9 % IV SOLN: Freq: Once | INTRAVENOUS | Status: AC

## 2022-01-13 MED ORDER — PROCHLORPERAZINE MALEATE 10 MG PO TABS
10.0000 mg | ORAL_TABLET | Freq: Once | ORAL | Status: AC
  Administered 2022-01-13: 10 mg via ORAL
  Filled 2022-01-13: qty 1

## 2022-01-13 MED ORDER — SODIUM CHLORIDE 0.9% FLUSH
10.0000 mL | INTRAVENOUS | Status: DC | PRN
  Administered 2022-01-13: 10 mL

## 2022-01-13 MED ORDER — DIPHENHYDRAMINE HCL 25 MG PO CAPS
50.0000 mg | ORAL_CAPSULE | Freq: Once | ORAL | Status: AC
  Administered 2022-01-13: 50 mg via ORAL
  Filled 2022-01-13: qty 2

## 2022-01-13 MED ORDER — SODIUM CHLORIDE 0.9 % IV SOLN
3.6000 mg/kg | Freq: Once | INTRAVENOUS | Status: AC
  Administered 2022-01-13: 420 mg via INTRAVENOUS
  Filled 2022-01-13: qty 16

## 2022-01-13 MED ORDER — SODIUM CHLORIDE 0.9% FLUSH
10.0000 mL | Freq: Once | INTRAVENOUS | Status: AC
  Administered 2022-01-13: 10 mL

## 2022-01-13 NOTE — Patient Instructions (Addendum)
[facility] MEDICAL [facility]COLOGY  Discharge Instructions: Thank you for choosing [facility] to provide your oncology and hematology care.   If you have a lab appointment with the Cancer Center, please go directly to the Cancer Center and check in at the registration area.   Wear comfortable clothing and clothing appropriate for easy access to any Portacath or PICC line.   We strive to give you quality time with your provider. You may need to reschedule your appointment if you arrive late (15 or more minutes).  Arriving late affects you and other patients whose appointments are after yours.  Also, if you miss three or more appointments without notifying the office, you may be dismissed from the clinic at the provider's discretion.      For prescription refill requests, have your pharmacy contact our office and allow 72 hours for refills to be completed.    Today you received the following chemotherapy and/or immunotherapy agents : Kadcyla      To help prevent nausea and vomiting after your treatment, we encourage you to take your nausea medication as directed.  BELOW ARE SYMPTOMS THAT SHOULD BE REPORTED IMMEDIATELY: *FEVER GREATER THAN 100.4 F (38 C) OR HIGHER *CHILLS OR SWEATING *NAUSEA AND VOMITING THAT IS NOT CONTROLLED WITH YOUR NAUSEA MEDICATION *UNUSUAL SHORTNESS OF BREATH *UNUSUAL BRUISING OR BLEEDING *URINARY PROBLEMS (pain or burning when urinating, or frequent urination) *BOWEL PROBLEMS (unusual diarrhea, constipation, pain near the anus) TENDERNESS IN MOUTH AND THROAT WITH OR WITHOUT PRESENCE OF ULCERS (sore throat, sores in mouth, or a toothache) UNUSUAL RASH, SWELLING OR PAIN  UNUSUAL VAGINAL DISCHARGE OR ITCHING   Items with * indicate a potential emergency and should be followed up as soon as possible or go to the Emergency Department if any problems should occur.  Please show the CHEMOTHERAPY ALERT CARD or IMMUNOTHERAPY ALERT CARD at check-in to  the Emergency Department and triage nurse.  Should you have questions after your visit or need to cancel or reschedule your appointment, please contact [facility]  Dept: [phone]  and follow the prompts.  Office hours are 8:00 a.m. to 4:30 p.m. Monday - Friday. Please note that voicemails left after 4:00 p.m. may not be returned until the following business day.  We are closed weekends and major holidays. You have access to a nurse at all times for urgent questions. Please call the main number to the clinic Dept: [phone] and follow the prompts.   For any non-urgent questions, you may also contact your provider using MyChart. We now offer e-Visits for anyone 18 and older to request care online for non-urgent symptoms. For details visit [email or web address].   Also download the MyChart app! Go to the app store, search "MyChart", open the app, select [facility], and log in with your MyChart username and password.  Masks are optional in the cancer centers. If you would like for your care team to wear a mask while they are taking care of you, please let them know. You may have one support person who is at least 34 years old accompany you for your appointments. 

## 2022-01-13 NOTE — Assessment & Plan Note (Addendum)
12/08/2020:21-week pregnancy: MRI at [facility] noncontrast revealed right axillary mass 3.7 cm, subpectoral lymph nodes, left supraclavicular lymph node, multiple level 2 and 3 lymph nodes in the right axilla, 2 breast masses 1.1 cm largest size.  Mammogram in [place] revealed 3 small masses at 12:00 1.2 cm, 0.6 cm, 0.9 cm and 5 abnormal axillary lymph nodes, pleomorphic calcifications measuring 10 cm, biopsy revealed grade 2 IDC with necrosis and calcifications ER 30%, PR 0%, HER2 positive, Ki-67 30 to 40%   PET/CT 03/24/2021: Multifocal right breast cancer, hypermetabolic right axillary, subpectoral, bilateral supraclavicular and mediastinal lymph nodes   Treatment plan: 1.  Neoadjuvant chemotherapy with Adriamycin and Cytoxan every 3 weeks x4 started 12/24/2020 completed 02/23/2021 2. delivery of the baby 3.  Continue neoadjuvant therapy with Taxol Herceptin and Perjeta (Taxol completed 06/09/21) 4.  Mastectomy with ALND:  07/05/21: 8 mm residual HG IDC with LVI, Margins Neg 4.  Kadcyla started 07/29/2021 5.  Adjuvant radiation therapy 08/23/2021 - 10/03/2021 6.  Adjuvant antiestrogen therapy: Zoladex with anastrozole started 10/28/2021 Genetic testing: Negative ---------------------------------------------------------------------------------------------------------------------------------- Current treatment:  1. Kadcyla started 07/29/2021, today is cycle 8 completed treatment 01/13/2022 2. Zoladex with anastrozole started 10/28/2021: Tolerating extremely well without any problems or concerns    Kadcyla toxicities:  mild fatigue Anemia: Microcytic: With normal iron studies.  Takes 1 tablet oral iron   Monitoring closely for toxicities   08/09/2021: PET CT scan: Right mastectomy and right axillary lymph node dissection: Postoperative changes and seroma.  No convincing evidence of distant metastatic disease.   Breast cancer surveillance:  Left breast mammogram 12/24/2021: Benign breast density category  B  Her son will be 64-year-old in February  Survivorship care plan visit will be in March 2024. [facility] minimal residual disease monitoring will be done as well. Monthly injection appointments Return to clinic for survivorship care plan visit

## 2022-01-18 ENCOUNTER — Other Ambulatory Visit: Payer: Self-pay | Admitting: Radiology

## 2022-01-19 NOTE — H&P (Signed)
Referring Physician(s): Nicholas Lose  Supervising Physician: Ruthann Cancer  Patient Status:  [facility] OP  Chief Complaint:  "I'm getting my port out"  Subjective: Patient known to IR service from Port-A-Cath placement on 12/24/2020.  She has a history of right breast cancer diagnosed in 2022, status post mastectomy and chemoradiation.  She has completed chemotherapy and request now received for port a cath removal. She denies fever,HA,CP,dyspnea, cough, abd/back pain,N/V or bleeding.    Past Medical History:  Diagnosis Date   Abnormal Pap smear 2012   colpo result benign   Cancer ([place])    Chlamydia    Family history of breast cancer 12/15/2020   History of pre-eclampsia    HPV (human papilloma virus) infection    Hyperemesis    Hypokalemia    Pregnancy induced hypertension    Ptyalism    Vaginal Pap smear, abnormal    Past Surgical History:  Procedure Laterality Date   BREAST REDUCTION SURGERY Left 07/05/2021   Procedure: LEFT MAMMARY REDUCTION  (BREAST);  Surgeon: Irene Limbo, MD;  Location: [facility];  Service: Plastics;  Laterality: Left;   COLPOSCOPY     IR IMAGING GUIDED PORT INSERTION  12/24/2020   NODE DISSECTION Right 07/05/2021   Procedure: RIGHT AXILLARY NODE DISSECTION;  Surgeon: Rolm Bookbinder, MD;  Location: [facility];  Service: General;  Laterality: Right;   SIMPLE MASTECTOMY WITH AXILLARY SENTINEL NODE BIOPSY Right 07/05/2021   Procedure: RIGHT MASTECTOMY;  Surgeon: Rolm Bookbinder, MD;  Location: [facility];  Service: General;  Laterality: Right;  [place] EXTRACTION        Allergies: Patient has no known allergies.  Medications: Prior to Admission medications   Medication Sig Start Date End Date Taking? Authorizing Provider  amoxicillin (AMOXIL) 500 MG capsule  12/06/21   [provider]  anastrozole (ARIMIDEX) 1 MG tablet Take 1 tablet (1 mg total) by mouth  daily. 10/21/21   Nicholas Lose, MD  carvedilol (COREG) 25 MG tablet Take 1 tablet (25 mg total) by mouth 2 (two) times daily. 06/24/21   Janina Mayo, MD  ibuprofen (ADVIL) 600 MG tablet Take 600 mg by mouth every 6 (six) hours as needed. 12/16/21   [provider]  levothyroxine (SYNTHROID) 75 MCG tablet Take 75 mcg by mouth every morning. 12/27/21   [provider]  lidocaine-prilocaine (EMLA) cream Apply 1 application. topically as needed (apply once weekly prior to port a cath needle insertion.). 03/29/21   Nicholas Lose, MD  losartan (COZAAR) 25 MG tablet TAKE 2 TABLETS(50 MG) BY MOUTH DAILY 11/17/21   Janina Mayo, MD  ondansetron (ZOFRAN) 8 MG tablet Take 1 tablet (8 mg total) by mouth every 8 (eight) hours as needed for nausea or vomiting. 09/09/21   Nicholas Lose, MD     Vital Signs: Vitals:   01/20/22 0953  BP: 101/65  Pulse: 67  Resp: 18  Temp: 98 F (36.7 C)  SpO2: 97%      Physical Exam: awake/alert; chest- CTA bilat ; clean, intact left chest wall port a cath; heart- RRR; abd- obese,soft,+BS,NT; no LE edema  Imaging: No results found.  Labs:  CBC: Recent Labs    11/11/21 0901 12/02/21 0926 12/23/21 0949 01/13/22 0922  WBC 6.2 4.7 4.0 4.2  HGB 11.4* 10.5* 10.6* 10.5*  HCT 34.9* 32.8* 32.8* 32.2*  PLT 255 280 269 218    COAGS: No results for input(s): "INR", "APTT"  in the last 8760 hours.  BMP: Recent Labs    11/11/21 0901 12/02/21 0926 12/23/21 0949 01/13/22 0922  NA 134* 138 137 137  K 4.1 3.7 4.2 3.8  CL 100 104 104 102  CO2 '27 27 27 29  '$ GLUCOSE 106* 103* 95 102*  BUN 21* '10 11 16  '$ CALCIUM 9.7 9.3 9.9 9.9  CREATININE 0.91 0.77 0.62 0.68  GFRNONAA >60 >60 >60 >60    LIVER FUNCTION TESTS: Recent Labs    11/11/21 0901 12/02/21 0926 12/23/21 0949 01/13/22 0922  BILITOT 0.4 0.5 0.5 0.6  AST 53* 42* 48* 73*  ALT 42 30 29 36  ALKPHOS 156* 143* 139* 134*  PROT 8.3* 8.5* 8.5* 8.1  ALBUMIN 4.0 3.8 4.1 3.9     Assessment and Plan: Patient known to IR service from Port-A-Cath placement on 12/24/2020.  She has a history of right breast cancer diagnosed in 2022, status post mastectomy and chemoradiation.  She has completed chemotherapy and request now received for port a cath removal.Risks and benefits of procedure was discussed with the patient  including, but not limited to bleeding, infection, damage to adjacent structures .  All of the questions were answered and there is agreement to proceed.  Consent signed and in chart.    Electronically Signed: D. Rowe Robert, PA-C 01/19/2022, 2:30 PM   I spent a total of 20 minutes at the the patient's bedside AND on the patient's hospital floor or unit, greater than 50% of which was counseling/coordinating care for port a cath removal

## 2022-01-20 ENCOUNTER — Ambulatory Visit (HOSPITAL_COMMUNITY)
Admission: RE | Admit: 2022-01-20 | Discharge: 2022-01-20 | Disposition: A | Discharge status: Home / Self Care | Payer: BC Managed Care – PPO | Source: Ambulatory Visit | Attending: Hematology and Oncology | Admitting: Hematology and Oncology

## 2022-01-20 ENCOUNTER — Inpatient Hospital Stay: Payer: BC Managed Care – PPO

## 2022-01-20 ENCOUNTER — Encounter (HOSPITAL_COMMUNITY): Payer: Self-pay

## 2022-01-20 ENCOUNTER — Other Ambulatory Visit: Payer: Self-pay

## 2022-01-20 ENCOUNTER — Telehealth: Payer: Self-pay | Admitting: *Deleted

## 2022-01-20 VITALS — BP 96/68 | HR 73 | Temp 98.2°F | Resp 18

## 2022-01-20 DIAGNOSIS — Z95828 Presence of other vascular implants and grafts: Secondary | ICD-10-CM

## 2022-01-20 DIAGNOSIS — Z452 Encounter for adjustment and management of vascular access device: Secondary | ICD-10-CM | POA: Diagnosis present

## 2022-01-20 DIAGNOSIS — Z9011 Acquired absence of right breast and nipple: Secondary | ICD-10-CM | POA: Insufficient documentation

## 2022-01-20 DIAGNOSIS — Z170 Estrogen receptor positive status [ER+]: Secondary | ICD-10-CM

## 2022-01-20 DIAGNOSIS — C50411 Malignant neoplasm of upper-outer quadrant of right female breast: Secondary | ICD-10-CM

## 2022-01-20 DIAGNOSIS — Z853 Personal history of malignant neoplasm of breast: Secondary | ICD-10-CM | POA: Diagnosis not present

## 2022-01-20 HISTORY — PX: IR REMOVAL TUN ACCESS W/ PORT W/O FL MOD SED: IMG2290

## 2022-01-20 MED ORDER — FENTANYL CITRATE (PF) 100 MCG/2ML IJ SOLN
INTRAMUSCULAR | Status: AC | PRN
  Administered 2022-01-20 (×2): 50 ug via INTRAVENOUS

## 2022-01-20 MED ORDER — SODIUM CHLORIDE 0.9 % IV SOLN: INTRAVENOUS | Status: DC

## 2022-01-20 MED ORDER — MIDAZOLAM HCL 2 MG/2ML IJ SOLN
INTRAMUSCULAR | Status: AC | PRN
  Administered 2022-01-20 (×2): 1 mg via INTRAVENOUS

## 2022-01-20 MED ORDER — MIDAZOLAM HCL 2 MG/2ML IJ SOLN
INTRAMUSCULAR | Status: AC
  Filled 2022-01-20: qty 2

## 2022-01-20 MED ORDER — GOSERELIN ACETATE 3.6 MG ~~LOC~~ IMPL
3.6000 mg | DRUG_IMPLANT | Freq: Once | SUBCUTANEOUS | Status: AC
  Administered 2022-01-20: 3.6 mg via SUBCUTANEOUS
  Filled 2022-01-20: qty 3.6

## 2022-01-20 MED ORDER — FENTANYL CITRATE (PF) 100 MCG/2ML IJ SOLN
INTRAMUSCULAR | Status: AC
  Filled 2022-01-20: qty 2

## 2022-01-20 MED ORDER — LIDOCAINE-EPINEPHRINE 1 %-1:100000 IJ SOLN
INTRAMUSCULAR | Status: AC
  Administered 2022-01-20: 10 mL via INTRADERMAL
  Filled 2022-01-20: qty 1

## 2022-01-20 NOTE — Discharge Instructions (Signed)
Moderate Conscious Sedation, Adult, Care After This sheet gives you information about how to care for yourself after your procedure. Your health care provider may also give you more specific instructions. If you have problems or questions, contact your health care provider. What can I expect after the procedure? After the procedure, it is common to have: Sleepiness for several hours. Impaired judgment for several hours. Difficulty with balance. Vomiting if you eat too soon. Follow these instructions at home: For the time period you were told by your health care provider:     Rest. Do not participate in activities where you could fall or become injured. Do not drive or use machinery. Do not drink alcohol. Do not take sleeping pills or medicines that cause drowsiness. Do not make important decisions or sign legal documents. Do not take care of children on your own. Eating and drinking  Follow the diet recommended by your health care provider. Drink enough fluid to keep your urine pale yellow. If you vomit: Drink water, juice, or soup when you can drink without vomiting. Make sure you have little or no nausea before eating solid foods. General instructions Take over-the-counter and prescription medicines only as told by your health care provider. Have a responsible adult stay with you for the time you are told. It is important to have someone help care for you until you are awake and alert. Do not smoke. Keep all follow-up visits as told by your health care provider. This is important. Contact a health care provider if: You are still sleepy or having trouble with balance after 24 hours. You feel light-headed. You keep feeling nauseous or you keep vomiting. You develop a rash. You have a fever. You have redness or swelling around the IV site. Get help right away if: You have trouble breathing. You have new-onset confusion at home. Summary After the procedure, it is common to  feel sleepy, have impaired judgment, or feel nauseous if you eat too soon. Rest after you get home. Know the things you should not do after the procedure. Follow the diet recommended by your health care provider and drink enough fluid to keep your urine pale yellow. Get help right away if you have trouble breathing or new-onset confusion at home. This information is not intended to replace advice given to you by your health care provider. Make sure you discuss any questions you have with your health care provider. Document Revised: 05/09/2019 Document Reviewed: 12/05/2018 Elsevier Patient Education  [place] Removal, Care After This sheet gives you information about how to care for yourself after your procedure. Your health care provider may also give you more specific instructions. If you have problems or questions, contact your health careprovider. What can I expect after the procedure? After the procedure, it is common to have: Soreness or pain near your incision. Some swelling or bruising near your incision. Follow these instructions at home: Medicines Take over-the-counter and prescription medicines only as told by your health care provider. If you were prescribed an antibiotic medicine, take it as told by your health care provider. Do not stop taking the antibiotic even if you start to feel better. Bathing Do not take baths, swim, or use a hot tub until your health care provider approves. Ask your health care provider if you can take showers. You may only be allowed to take sponge baths. Incision care Follow instructions from your health care provider about how to take care of your  incision. Make sure you: Wash your hands with soap and water before you change your bandage (dressing). If soap and water are not available, use hand sanitizer. Change your dressing as told by your health care provider. Keep your dressing dry. Leave  skin glue, or adhesive strips  in place. These skin closures may need to stay in place for 2 weeks or longer. If adhesive strip edges start to loosen and curl up, you may trim the loose edges.  Check your incision area every day for signs of infection. Check for:       - More redness, swelling, or pain.       - More fluid or blood.       - Warmth.       - Pus or a bad smell.  Driving Do not drive for 24 hours if you were given a medicine to help you relax (sedative) during your procedure. If you did not receive a sedative, ask your health care provider when it is safe to drive.  Activity Return to your normal activities as told by your health care provider. Ask your health care provider what activities are safe for you. Do not lift anything that is heavier than 10 lb (4.5 kg), or the limit that you are told, until your health care provider says that it is safe. Do not do activities that involve lifting your arms over your head. General instructions Do not use any products that contain nicotine or tobacco, such as cigarettes and e-cigarettes. These can delay healing. If you need help quitting, ask your health care provider. Keep all follow-up visits as told by your health care provider. This is important. Contact a health care provider if: You have more redness, swelling, or pain around your incision. You have more fluid or blood coming from your incision. Your incision feels warm to the touch. You have pus or a bad smell coming from your incision. You have pain that is not relieved by your pain medicine. Get help right away if you have: A fever or chills. Chest pain. Difficulty breathing. Summary After the procedure, it is common to have pain, soreness, swelling, or bruising near your incision. If you were prescribed an antibiotic medicine, take it as told by your health care provider. Do not stop taking the antibiotic even if you start to feel better. Do not drive for 24 hours if you were given a sedative during  your procedure. Return to your normal activities as told by your health care provider. Ask your health care provider what activities are safe for you. This information is not intended to replace advice given to you by your health care provider. Make sure you discuss any questions you have with your healthcare provider. Document Revised: 02/22/2017 Document Reviewed: 02/22/2017 Elsevier Patient Education  2022 [employer].

## 2022-01-20 NOTE — Procedures (Signed)
Interventional Radiology Procedure Note  Procedure: Port removal  Findings: Please refer to procedural dictation for full description.  Complications: None immediate  Estimated Blood Loss: < 5 mL  Recommendations: Routine wound care. Follow up with IR as needed.   Aymara Sassi, MD   

## 2022-01-20 NOTE — Telephone Encounter (Signed)
[place] paperwork completed today by this nurse placed in collaborative bin for provider review, signature and return to this nurse.

## 2022-01-27 ENCOUNTER — Encounter: Payer: Self-pay | Admitting: *Deleted

## 2022-01-27 DIAGNOSIS — Z170 Estrogen receptor positive status [ER+]: Secondary | ICD-10-CM

## 2022-02-02 ENCOUNTER — Encounter: Payer: Self-pay | Admitting: Hematology and Oncology

## 2022-02-07 ENCOUNTER — Encounter (HOSPITAL_COMMUNITY): Payer: Self-pay

## 2022-02-09 ENCOUNTER — Telehealth: Payer: Self-pay | Admitting: *Deleted

## 2022-02-09 ENCOUNTER — Encounter: Payer: Self-pay | Admitting: Hematology and Oncology

## 2022-02-09 NOTE — Telephone Encounter (Signed)
"  G[place]st, 1985/03/28, [phone] (home) calling to request a letter to return to work.  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  Collaborative and provider are best to construct letter for your HR rep., [place] Mink with [facility].  This nurse routing request to [facility] Staff.  Letters are not within the work duty, Photographer or responsibility of [place] form staff.   Day denies medical symptoms to support continuous leave or restrictions however reports time needed for future appointments to receive Zoladex every 28-days, F/U appointments with medical oncology, surgeon, SOZO screenings and radiation oncology surveillance as medically necessary, thus this nurse also advised she bring the [place] form to form staff for an intermittent LOA with her [place] paperwork due to HR by 03/04/2022 for January 2024 payroll.   "Received an FMLA approval note for 2024.  Will call Butch Penny to clarify if current FMLA needs changes or updates for intermittent leave."   Currently no further questions or needs.

## 2022-02-10 ENCOUNTER — Encounter: Payer: Self-pay | Admitting: *Deleted

## 2022-02-10 NOTE — Telephone Encounter (Signed)
Evelyn Marshall returned call.  "The return to work letter needs to lists any restrictions or accommodations.  I do need a new FMLA form an intermittent leave of absence.  Has the letter been completed and how will it be returned?  Today I will bring FMLA request with the 703 form because I have to sign something correct." Complete all employee sections of forms however a new [place] is not needed at this time.  ROI signed last month is good for ninety days from date of signature for [place].  Read letter completed today that has been faxed to [phone]. Currently no further questions or needs.Marland Kitchen

## 2022-02-10 NOTE — Progress Notes (Signed)
Per pt request RN successfully faxed return to work letter to [employer] at [phone].

## 2022-02-17 ENCOUNTER — Encounter: Payer: Self-pay | Admitting: Internal Medicine

## 2022-02-17 ENCOUNTER — Inpatient Hospital Stay: Payer: BC Managed Care – PPO | Attending: Hematology and Oncology

## 2022-02-17 VITALS — BP 98/44 | HR 69 | Temp 98.2°F | Resp 18

## 2022-02-17 DIAGNOSIS — Z95828 Presence of other vascular implants and grafts: Secondary | ICD-10-CM

## 2022-02-17 DIAGNOSIS — Z5111 Encounter for antineoplastic chemotherapy: Secondary | ICD-10-CM | POA: Diagnosis present

## 2022-02-17 DIAGNOSIS — Z170 Estrogen receptor positive status [ER+]: Secondary | ICD-10-CM | POA: Diagnosis not present

## 2022-02-17 DIAGNOSIS — C50411 Malignant neoplasm of upper-outer quadrant of right female breast: Secondary | ICD-10-CM | POA: Insufficient documentation

## 2022-02-17 MED ORDER — GOSERELIN ACETATE 3.6 MG ~~LOC~~ IMPL
3.6000 mg | DRUG_IMPLANT | Freq: Once | SUBCUTANEOUS | Status: AC
  Administered 2022-02-17: 3.6 mg via SUBCUTANEOUS
  Filled 2022-02-17: qty 3.6

## 2022-03-01 ENCOUNTER — Telehealth: Payer: Self-pay | Admitting: *Deleted

## 2022-03-01 NOTE — Telephone Encounter (Signed)
02/28/2022: This nurse completed current disability and new intermittent LOA request.  To collaborative pick up bin for provider review, signature and return. 03/01/2022: Received paperwork signed by provider.  Successfully returned via fax to [place].  Copy to H.I.[employer] bin for items to be scanned.  Original mailed to patient address on file completes process.  No further instructions received or actions performed by this nurse.

## 2022-03-17 ENCOUNTER — Inpatient Hospital Stay: Payer: BC Managed Care – PPO | Attending: Hematology and Oncology

## 2022-03-17 VITALS — BP 106/57 | HR 64 | Temp 98.5°F | Resp 18

## 2022-03-17 DIAGNOSIS — C50411 Malignant neoplasm of upper-outer quadrant of right female breast: Secondary | ICD-10-CM | POA: Diagnosis present

## 2022-03-17 DIAGNOSIS — Z5111 Encounter for antineoplastic chemotherapy: Secondary | ICD-10-CM | POA: Diagnosis present

## 2022-03-17 DIAGNOSIS — Z95828 Presence of other vascular implants and grafts: Secondary | ICD-10-CM

## 2022-03-17 MED ORDER — EPINEPHRINE 0.3 MG/0.3ML IJ SOAJ: 0.3000 mg | Freq: Once | INTRAMUSCULAR | Status: DC | PRN

## 2022-03-17 MED ORDER — SODIUM CHLORIDE 0.9 % IV SOLN: Freq: Once | INTRAVENOUS | Status: DC

## 2022-03-17 MED ORDER — SODIUM CHLORIDE 0.9 % IV SOLN: Freq: Once | INTRAVENOUS | Status: DC | PRN

## 2022-03-17 MED ORDER — SODIUM CHLORIDE 0.9% FLUSH: 10.0000 mL | Freq: Once | INTRAVENOUS | Status: DC | PRN

## 2022-03-17 MED ORDER — GOSERELIN ACETATE 3.6 MG ~~LOC~~ IMPL
3.6000 mg | DRUG_IMPLANT | Freq: Once | SUBCUTANEOUS | Status: AC
  Administered 2022-03-17: 3.6 mg via SUBCUTANEOUS
  Filled 2022-03-17: qty 3.6

## 2022-03-17 MED ORDER — FAMOTIDINE IN NACL 20-0.9 MG/50ML-% IV SOLN: 20.0000 mg | Freq: Once | INTRAVENOUS | Status: DC | PRN

## 2022-03-17 MED ORDER — ALBUTEROL SULFATE (2.5 MG/3ML) 0.083% IN NEBU: 2.5000 mg | INHALATION_SOLUTION | Freq: Once | RESPIRATORY_TRACT | Status: DC | PRN

## 2022-03-17 MED ORDER — SODIUM CHLORIDE 0.9% FLUSH: 3.0000 mL | Freq: Once | INTRAVENOUS | Status: DC | PRN

## 2022-03-17 MED ORDER — METHYLPREDNISOLONE SODIUM SUCC 125 MG IJ SOLR: 125.0000 mg | Freq: Once | INTRAMUSCULAR | Status: DC | PRN

## 2022-03-17 MED ORDER — HEPARIN SOD (PORK) LOCK FLUSH 100 UNIT/ML IV SOLN: 250.0000 [IU] | Freq: Once | INTRAVENOUS | Status: DC | PRN

## 2022-03-17 MED ORDER — DIPHENHYDRAMINE HCL 50 MG/ML IJ SOLN: 50.0000 mg | Freq: Once | INTRAMUSCULAR | Status: DC | PRN

## 2022-03-17 MED ORDER — ALTEPLASE 2 MG IJ SOLR: 2.0000 mg | Freq: Once | INTRAMUSCULAR | Status: DC | PRN

## 2022-03-17 MED ORDER — HEPARIN SOD (PORK) LOCK FLUSH 100 UNIT/ML IV SOLN: 500.0000 [IU] | Freq: Once | INTRAVENOUS | Status: DC | PRN

## 2022-04-03 ENCOUNTER — Telehealth: Payer: Self-pay | Admitting: Adult Health

## 2022-04-03 NOTE — Telephone Encounter (Signed)
Rescheduled appointment per provider requested. Patient is aware of the changes made to her upcoming appointment. 

## 2022-04-06 ENCOUNTER — Ambulatory Visit (HOSPITAL_COMMUNITY): Payer: BC Managed Care – PPO | Attending: Internal Medicine

## 2022-04-06 DIAGNOSIS — I361 Nonrheumatic tricuspid (valve) insufficiency: Secondary | ICD-10-CM | POA: Diagnosis not present

## 2022-04-06 DIAGNOSIS — Z5181 Encounter for therapeutic drug level monitoring: Secondary | ICD-10-CM | POA: Diagnosis present

## 2022-04-06 DIAGNOSIS — Z79899 Other long term (current) drug therapy: Secondary | ICD-10-CM | POA: Insufficient documentation

## 2022-04-06 LAB — ECHOCARDIOGRAM LIMITED
Area-P 1/2: 3.39 cm2
S' Lateral: 3 cm

## 2022-04-11 ENCOUNTER — Other Ambulatory Visit: Payer: Self-pay | Admitting: *Deleted

## 2022-04-11 DIAGNOSIS — Z170 Estrogen receptor positive status [ER+]: Secondary | ICD-10-CM

## 2022-04-13 ENCOUNTER — Telehealth: Payer: Self-pay | Admitting: *Deleted

## 2022-04-13 ENCOUNTER — Encounter: Payer: Self-pay | Admitting: Adult Health

## 2022-04-13 ENCOUNTER — Encounter: Payer: BC Managed Care – PPO | Admitting: Adult Health

## 2022-04-13 ENCOUNTER — Inpatient Hospital Stay (HOSPITAL_BASED_OUTPATIENT_CLINIC_OR_DEPARTMENT_OTHER): Payer: BC Managed Care – PPO | Admitting: Adult Health

## 2022-04-13 ENCOUNTER — Ambulatory Visit: Payer: BC Managed Care – PPO

## 2022-04-13 ENCOUNTER — Inpatient Hospital Stay: Payer: BC Managed Care – PPO

## 2022-04-13 ENCOUNTER — Inpatient Hospital Stay: Payer: BC Managed Care – PPO | Attending: Hematology and Oncology

## 2022-04-13 ENCOUNTER — Other Ambulatory Visit: Payer: BC Managed Care – PPO

## 2022-04-13 VITALS — BP 125/76 | HR 65 | Temp 97.7°F | Resp 17 | Ht 64.0 in | Wt 226.5 lb

## 2022-04-13 DIAGNOSIS — C50411 Malignant neoplasm of upper-outer quadrant of right female breast: Secondary | ICD-10-CM | POA: Diagnosis present

## 2022-04-13 DIAGNOSIS — Z170 Estrogen receptor positive status [ER+]: Secondary | ICD-10-CM | POA: Diagnosis not present

## 2022-04-13 DIAGNOSIS — Z5111 Encounter for antineoplastic chemotherapy: Secondary | ICD-10-CM | POA: Diagnosis present

## 2022-04-13 DIAGNOSIS — Z95828 Presence of other vascular implants and grafts: Secondary | ICD-10-CM

## 2022-04-13 DIAGNOSIS — Z13820 Encounter for screening for osteoporosis: Secondary | ICD-10-CM | POA: Diagnosis not present

## 2022-04-13 DIAGNOSIS — Z79811 Long term (current) use of aromatase inhibitors: Secondary | ICD-10-CM | POA: Diagnosis not present

## 2022-04-13 LAB — CBC WITH DIFFERENTIAL (CANCER CENTER ONLY)
Abs Immature Granulocytes: 0.01 10*3/uL (ref 0.00–0.07)
Basophils Absolute: 0 10*3/uL (ref 0.0–0.1)
Basophils Relative: 1 %
Eosinophils Absolute: 0.1 10*3/uL (ref 0.0–0.5)
Eosinophils Relative: 1 %
HCT: 32.9 % — ABNORMAL LOW (ref 36.0–46.0)
Hemoglobin: 10.9 g/dL — ABNORMAL LOW (ref 12.0–15.0)
Immature Granulocytes: 0 %
Lymphocytes Relative: 30 %
Lymphs Abs: 1.8 10*3/uL (ref 0.7–4.0)
MCH: 25.5 pg — ABNORMAL LOW (ref 26.0–34.0)
MCHC: 33.1 g/dL (ref 30.0–36.0)
MCV: 76.9 fL — ABNORMAL LOW (ref 80.0–100.0)
Monocytes Absolute: 0.5 10*3/uL (ref 0.1–1.0)
Monocytes Relative: 9 %
Neutro Abs: 3.6 10*3/uL (ref 1.7–7.7)
Neutrophils Relative %: 59 %
Platelet Count: 282 10*3/uL (ref 150–400)
RBC: 4.28 MIL/uL (ref 3.87–5.11)
RDW: 17.2 % — ABNORMAL HIGH (ref 11.5–15.5)
WBC Count: 6 10*3/uL (ref 4.0–10.5)
nRBC: 0 % (ref 0.0–0.2)

## 2022-04-13 LAB — CMP (CANCER CENTER ONLY)
ALT: 17 U/L (ref 0–44)
AST: 24 U/L (ref 15–41)
Albumin: 3.9 g/dL (ref 3.5–5.0)
Alkaline Phosphatase: 108 U/L (ref 38–126)
Anion gap: 8 (ref 5–15)
BUN: 13 mg/dL (ref 6–20)
CO2: 31 mmol/L (ref 22–32)
Calcium: 9.9 mg/dL (ref 8.9–10.3)
Chloride: 97 mmol/L — ABNORMAL LOW (ref 98–111)
Creatinine: 0.78 mg/dL (ref 0.44–1.00)
GFR, Estimated: >60 mL/min (ref >60)
Glucose, Bld: 88 mg/dL (ref 70–99)
Potassium: 4.1 mmol/L (ref 3.5–5.1)
Sodium: 136 mmol/L (ref 135–145)
Total Bilirubin: 0.4 mg/dL (ref 0.3–1.2)
Total Protein: 7.8 g/dL (ref 6.5–8.1)

## 2022-04-13 MED ORDER — GOSERELIN ACETATE 3.6 MG ~~LOC~~ IMPL
3.6000 mg | DRUG_IMPLANT | Freq: Once | SUBCUTANEOUS | Status: AC
  Administered 2022-04-13: 3.6 mg via SUBCUTANEOUS
  Filled 2022-04-13: qty 3.6

## 2022-04-13 NOTE — Telephone Encounter (Signed)
Per MD request, RN placed call to pt regarding negative (Not Detected) recent Signatera testing.  Pt appreciative of call and verbalized understanding.  

## 2022-04-13 NOTE — Progress Notes (Signed)
SURVIVORSHIP VISIT:   BRIEF ONCOLOGIC HISTORY:  Oncology History  Malignant neoplasm of upper-outer quadrant of right breast in female, estrogen receptor positive ([place])  12/08/2020 Initial Diagnosis   21-week pregnancy: MRI at [facility] noncontrast revealed right axillary mass 3.7 cm, subpectoral lymph nodes, left supraclavicular lymph node, multiple level 2 and 3 lymph nodes in the right axilla, 2 breast masses 1.1 cm largest size.  Mammogram in [place] revealed 3 small masses at 12:00 1.2 cm, 0.6 cm, 0.9 cm and 5 abnormal axillary lymph nodes, pleomorphic calcifications measuring 10 cm, biopsy revealed grade 2 IDC with necrosis and calcifications ER 30%, PR 0%, HER2 positive, Ki-67 30 to 40%   12/15/2020 Cancer Staging   Staging form: Breast, AJCC 8th Edition - Clinical: Stage IIIA (cT3, cN2, cM0, G2, ER+, PR-, HER2+) - Signed by Gardenia Phlegm, NP on 04/13/2022 Histologic grading system: 3 grade system   12/24/2020 - 02/23/2021 Chemotherapy   Patient is on Treatment Plan : BREAST Adjuvant AC q21d     12/29/2020 Genetic Testing   Negative hereditary cancer genetic testing: no pathogenic variants detected in [facility] BRCAPlus Panel or [facility] CustomNext-Cancer +RNAinsight Panel.  The report dates are 12/23/2020 and 12/29/2020.   The BRCAplus panel offered by [employer] and includes sequencing and deletion/duplication analysis for the following 8 genes: ATM, BRCA1, BRCA2, CDH1, CHEK2, PALB2, PTEN, and TP53.  The CustomNext-Cancer+RNAinsight panel offered by [facility] includes sequencing and rearrangement analysis for the following 47 genes:  APC, ATM, AXIN2, BARD1, BMPR1A, BRCA1, BRCA2, BRIP1, CDH1, CDK4, CDKN2A, CHEK2, DICER1, EPCAM, GREM1, HOXB13, MEN1, MLH1, MSH2, MSH3, MSH6, MUTYH, NBN, NF1, NF2, NTHL1, PALB2, PMS2, POLD1, POLE, PTEN, RAD51C, RAD51D, RECQL, RET, SDHA, SDHAF2, SDHB, SDHC, SDHD, SMAD4, SMARCA4, STK11, TP53, TSC1, TSC2, and VHL.  RNA data is routinely analyzed for use in  variant interpretation for all genes.   03/25/2021 - 06/30/2021 Chemotherapy   Patient is on Treatment Plan : BREAST AC q21 days / Trastuzumab + Pertuzumab + Weekly Paclitaxel q21d     07/05/2021 Surgery   8 mm residual HG IDC with LVI, Margins Neg    07/29/2021 -  Chemotherapy   Patient is on Treatment Plan : BREAST ADO-Trastuzumab Emtansine (Kadcyla) q21d     08/22/2021 - 09/27/2021 Radiation Therapy   Right Chest Wall: 50 Gy in 25 treatments   10/2021 -  Anti-estrogen oral therapy   Anastrozole     INTERVAL HISTORY:  Evelyn Marshall to review her survivorship care plan detailing her treatment course for breast cancer, as well as monitoring long-term side effects of that treatment, education regarding health maintenance, screening, and overall wellness and health promotion.     Overall, Evelyn Marshall reports feeling quite well.  She continues on anastrozole and Zoladex given every 4 weeks.  She is doing moderately well on this does have some hot flashes.  REVIEW OF SYSTEMS:  Review of Systems  Constitutional:  Negative for appetite change, chills, fatigue, fever and unexpected weight change.  HENT:   Negative for hearing loss, lump/mass and trouble swallowing.   Eyes:  Negative for eye problems and icterus.  Respiratory:  Negative for chest tightness, cough and shortness of breath.   Cardiovascular:  Negative for chest pain, leg swelling and palpitations.  Gastrointestinal:  Negative for abdominal distention, abdominal pain, constipation, diarrhea, nausea and vomiting.  Endocrine: Negative for hot flashes.  Genitourinary:  Negative for difficulty urinating.   Musculoskeletal:  Negative for arthralgias.  Skin:  Negative for itching and rash.  Neurological:  Negative for dizziness, extremity weakness, headaches and numbness.  Hematological:  Negative for adenopathy. Does not bruise/bleed easily.  Psychiatric/Behavioral:  Negative for depression. The patient is not nervous/anxious.    Breast:  Denies any new nodularity, masses, tenderness, nipple changes, or nipple discharge.    PAST MEDICAL/SURGICAL HISTORY:  Past Medical History:  Diagnosis Date   Abnormal Pap smear 2012   colpo result benign   Cancer ([place])    Chlamydia    Family history of breast cancer 12/15/2020   History of pre-eclampsia    HPV (human papilloma virus) infection    Hyperemesis    Hypokalemia    Pregnancy induced hypertension    Ptyalism    Vaginal Pap smear, abnormal    Past Surgical History:  Procedure Laterality Date   BREAST REDUCTION SURGERY Left 07/05/2021   Procedure: LEFT MAMMARY REDUCTION  (BREAST);  Surgeon: Irene Limbo, MD;  Location: [facility];  Service: Plastics;  Laterality: Left;   COLPOSCOPY     IR IMAGING GUIDED PORT INSERTION  12/24/2020   IR REMOVAL TUN ACCESS W/ PORT W/O FL MOD SED  01/20/2022   NODE DISSECTION Right 07/05/2021   Procedure: RIGHT AXILLARY NODE DISSECTION;  Surgeon: Rolm Bookbinder, MD;  Location: [facility];  Service: General;  Laterality: Right;   SIMPLE MASTECTOMY WITH AXILLARY SENTINEL NODE BIOPSY Right 07/05/2021   Procedure: RIGHT MASTECTOMY;  Surgeon: Rolm Bookbinder, MD;  Location: [facility];  Service: General;  Laterality: Right;  GEN & PEC BLOCK   WISDOM TOOTH EXTRACTION       ALLERGIES:  No Known Allergies   CURRENT MEDICATIONS:  Outpatient Encounter Medications as of 04/13/2022  Medication Sig   anastrozole (ARIMIDEX) 1 MG tablet Take 1 tablet (1 mg total) by mouth daily.   carvedilol (COREG) 25 MG tablet Take 1 tablet (25 mg total) by mouth 2 (two) times daily.   ibuprofen (ADVIL) 600 MG tablet Take 600 mg by mouth every 6 (six) hours as needed.   levothyroxine (SYNTHROID) 75 MCG tablet Take 75 mcg by mouth every morning.   lidocaine-prilocaine (EMLA) cream Apply 1 application. topically as needed (apply once weekly prior to port a cath needle insertion.).   losartan (COZAAR) 25 MG tablet  TAKE 2 TABLETS(50 MG) BY MOUTH DAILY (Patient taking differently: Take 25 mg by mouth daily. Pt taking 1 tablet daily)   ondansetron (ZOFRAN) 8 MG tablet Take 1 tablet (8 mg total) by mouth every 8 (eight) hours as needed for nausea or vomiting.   [DISCONTINUED] amoxicillin (AMOXIL) 500 MG capsule  (Patient not taking: Reported on 04/13/2022)   No facility-administered encounter medications on file as of 04/13/2022.     ONCOLOGIC FAMILY HISTORY:  Family History  Problem Relation Age of Onset   Heart disease Father    Hypertension Father    Stroke Father    Pancreatic cancer Maternal Aunt 73   Prostate cancer Paternal Uncle    Prostate cancer Paternal Uncle    Breast cancer Paternal Grandmother        dx 20s   Stroke Paternal Grandmother    Breast cancer Cousin 56       maternal female cousin      SOCIAL HISTORY:  Social History   Socioeconomic History   Marital status: Married    Spouse name: Belenda Cruise   Number of children: 3   Years of education: 16   Highest education level: Bachelor's degree (e.g., BA, AB, BS)  Occupational History  Occupation: Pharmacist, hospital  Tobacco Use   Smoking status: Never   Smokeless tobacco: Never  Vaping Use   Vaping Use: Never used  Substance and Sexual Activity   Alcohol use: No   Drug use: No   Sexual activity: Yes  Other Topics Concern   Not on file  Social History Narrative   Not on file   Social Determinants of Health   Financial Resource Strain: Not on file  Food Insecurity: Not on file  Transportation Needs: Not on file  Physical Activity: Not on file  Stress: Not on file  Social Connections: Not on file  Intimate Partner Violence: Not on file     OBSERVATIONS/OBJECTIVE:  BP 125/76 (BP Location: Left Arm, Patient Position: Sitting)   Pulse 65   Temp 97.7 F (36.5 C) (Temporal)   Resp 17   Ht 5\' 4"  (1.626 m)   Wt 226 lb 8 oz (102.7 kg)   SpO2 100%   BMI 38.88 kg/m  GENERAL: Patient is a well appearing female in no  acute distress HEENT:  Sclerae anicteric.  Oropharynx clear and moist. No ulcerations or evidence of oropharyngeal candidiasis. Neck is supple.  NODES:  No cervical, supraclavicular, or axillary lymphadenopathy palpated.  LUNGS:  Clear to auscultation bilaterally.  No wheezes or rhonchi. HEART:  Regular rate and rhythm. No murmur appreciated. ABDOMEN:  Soft, nontender.  Positive, normoactive bowel sounds. No organomegaly palpated. MSK:  No focal spinal tenderness to palpation. Full range of motion bilaterally in the upper extremities. EXTREMITIES:  No peripheral edema.   SKIN:  Clear with no obvious rashes or skin changes. No nail dyscrasia. NEURO:  Nonfocal. Well oriented.  Appropriate affect.   LABORATORY DATA:  None for this visit.  DIAGNOSTIC IMAGING:  None for this visit.      ASSESSMENT AND PLAN:  Ms.. Marshall is a pleasant 37 y.o. 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  She presents to the Survivorship Clinic for our initial meeting and routine follow-up post-completion of treatment for breast cancer.    1. Stage 3A right/left breast cancer:  Evelyn Marshall is continuing to recover from definitive treatment for breast cancer. She will follow-up with her medical oncologist, Dr. Lindi Adie in 6 months with history and physical exam per surveillance protocol.  She will continue her anti-estrogen therapy with anastrozole and Zoladex every 4 weeks. Thus far, she is tolerating the anastrozole well, with minimal side effects. She was instructed to make Dr. Lindi Adie or myself aware if she begins to experience any worsening side effects of the medication and I could see  her back in clinic to help manage those side effects, as needed.Today, a comprehensive survivorship care plan and treatment summary was reviewed with the patient today detailing her breast cancer diagnosis, treatment course, potential late/long-term effects of treatment, appropriate follow-up care with recommendations for the future, and patient education resources.  A copy of this summary, along with a letter will be sent to the patient's primary care provider via mail/fax/In Basket message after today's visit.    2. Bone health:  Given Evelyn Marshall's age/history of breast cancer and her current treatment regimen including anti-estrogen therapy with anastrozole, she is at risk for bone demineralization.  Orders placed for bone density testing.  She  was given education on specific activities to promote bone health.  3. Cancer screening:  Due to Evelyn Marshall's history and her age, she should receive screening for skin cancers and gynecologic cancers.  The information and recommendations are listed on the patient's comprehensive care plan/treatment summary and were reviewed in detail with the patient.    4. Health maintenance and wellness promotion: Evelyn Marshall was encouraged to consume 5-7 servings of fruits and vegetables per day. We reviewed the "Nutrition Rainbow" handout.  She was also encouraged to engage in moderate to vigorous exercise for 30 minutes per day most days of the week.  She was instructed to limit her alcohol consumption and continue to abstain from tobacco use.     5. Support services/counseling: It is not uncommon for this period of the patient's cancer care trajectory to be one of many emotions and stressors. She was given information regarding our available services and encouraged to contact me with any questions or for help enrolling in any of our support group/programs.    Follow up instructions:    -Return to cancer center every 4 weeks for Zoladex injections to see Dr. Lindi Adie with  third injection -Bone Density testing ordered -Continue Signatera testing -She is welcome to return back to the Survivorship Clinic at any time; no additional follow-up needed at this time.  -Consider referral back to survivorship as a long-term survivor for continued surveillance  The patient was provided an opportunity to ask questions and all were answered. The patient agreed with the plan and demonstrated an understanding of the instructions.   Total encounter time:40 minutes*in face-to-face visit time, chart review, lab review, care coordination, order entry, and documentation of the encounter time.    Wilber Bihari, NP 04/14/22 3:08 PM Medical Oncology and Hematology [facility] [place], [place] [address] Tel. [phone]    Fax. [phone]  *Total Encounter Time as defined by the Centers for Medicare and Medicaid Services includes, in addition to the face-to-face time of a patient visit (documented in the note above) non-face-to-face time: obtaining and reviewing outside history, ordering and reviewing medications, tests or procedures, care coordination (communications with other health care professionals or caregivers) and documentation in the medical record.

## 2022-04-13 NOTE — Progress Notes (Signed)
Bone Density scan order faxed to [facility] per Wilber Bihari, NP request at [phone] with receipt confirmation.

## 2022-04-13 NOTE — Patient Instructions (Signed)
Goserelin Implant What is this medication? GOSERELIN (GOE se rel in) treats prostate cancer and breast cancer. It works by decreasing levels of the hormones testosterone and estrogen in the body. This prevents prostate and breast cancer cells from spreading or growing. It may also be used to treat endometriosis. This is a condition where the tissue that lines the uterus grows outside the uterus. It works by decreasing the amount of estrogen your body makes, which reduces heavy bleeding and pain. It can also be used to help thin the lining of the uterus before a surgery used to prevent or reduce heavy periods. This medicine may be used for other purposes; ask your health care provider or pharmacist if you have questions. COMMON BRAND NAME(S): Zoladex, Zoladex 3-Month What should I tell my care team before I take this medication? They need to know if you have any of these conditions: Bone problems Diabetes Heart disease History of irregular heartbeat or rhythm An unusual or allergic reaction to goserelin, other medications, foods, dyes, or preservatives Pregnant or trying to get pregnant Breastfeeding How should I use this medication? This medication is injected under the skin. It is given by your care team in a hospital or clinic setting. Talk to your care team about the use of this medication in children. Special care may be needed. Overdosage: If you think you have taken too much of this medicine contact a poison control center or emergency room at once. NOTE: This medicine is only for you. Do not share this medicine with others. What if I miss a dose? Keep appointments for follow-up doses. It is important not to miss your dose. Call your care team if you are unable to keep an appointment. What may interact with this medication? Do not take this medication with any of the following: Cisapride Dronedarone Pimozide Thioridazine This medication may also interact with the following: Other  medications that cause heart rhythm changes This list may not describe all possible interactions. Give your health care provider a list of all the medicines, herbs, non-prescription drugs, or dietary supplements you use. Also tell them if you smoke, drink alcohol, or use illegal drugs. Some items may interact with your medicine. What should I watch for while using this medication? Visit your care team for regular checks on your progress. Your symptoms may appear to get worse during the first weeks of this therapy. Tell your care team if your symptoms do not start to get better or if they get worse after this time. Using this medication for a long time may weaken your bones. If you smoke or frequently drink alcohol you may increase your risk of bone loss. A family history of osteoporosis, chronic use of medications for seizures (convulsions), or corticosteroids can also increase your risk of bone loss. The risk of bone fractures may be increased. Talk to your care team about your bone health. This medication may increase blood sugar. The risk may be higher in patients who already have diabetes. Ask your care team what you can do to lower your risk of diabetes while taking this medication. This medication should stop regular monthly menstruation in women. Tell your care team if you continue to menstruate. Talk to your care team if you wish to become pregnant or think you might be pregnant. This medication can cause serious birth defects if taken during pregnancy or for 12 weeks after stopping treatment. Talk to your care team about reliable forms of contraception. Do not breastfeed while taking this   medication. This medication may cause infertility. Talk to your care team if you are concerned about your fertility. What side effects may I notice from receiving this medication? Side effects that you should report to your care team as soon as possible: Allergic reactions--skin rash, itching, hives, swelling  of the face, lips, tongue, or throat Change in the amount of urine Heart attack--pain or tightness in the chest, shoulders, arms, or jaw, nausea, shortness of breath, cold or clammy skin, feeling faint or lightheaded Heart rhythm changes--fast or irregular heartbeat, dizziness, feeling faint or lightheaded, chest pain, trouble breathing High blood sugar (hyperglycemia)--increased thirst or amount of urine, unusual weakness or fatigue, blurry vision High calcium level--increased thirst or amount of urine, nausea, vomiting, confusion, unusual weakness or fatigue, bone pain Pain, redness, irritation, or bruising at the injection site Severe back pain, numbness or weakness of the hands, arms, legs, or feet, loss of coordination, loss of bowel or bladder control Stroke--sudden numbness or weakness of the face, arm, or leg, trouble speaking, confusion, trouble walking, loss of balance or coordination, dizziness, severe headache, change in vision Swelling and pain of the tumor site or lymph nodes Trouble passing urine Side effects that usually do not require medical attention (report to your care team if they continue or are bothersome): Change in sex drive or performance Headache Hot flashes Rapid or extreme change in emotion or mood Sweating Swelling of the ankles, hands, or feet Unusual vaginal discharge, itching, or odor This list may not describe all possible side effects. Call your doctor for medical advice about side effects. You may report side effects to FDA at 1-800-FDA-1088. Where should I keep my medication? This medication is given in a hospital or clinic. It will not be stored at home. NOTE: This sheet is a summary. It may not cover all possible information. If you have questions about this medicine, talk to your doctor, pharmacist, or health care provider.  2023 Elsevier/Gold Standard (2021-05-25 00:00:00)  

## 2022-04-14 ENCOUNTER — Encounter: Payer: Self-pay | Admitting: Hematology and Oncology

## 2022-04-17 ENCOUNTER — Ambulatory Visit: Payer: BC Managed Care – PPO | Attending: General Surgery

## 2022-04-17 VITALS — Wt 224.1 lb

## 2022-04-17 DIAGNOSIS — Z483 Aftercare following surgery for neoplasm: Secondary | ICD-10-CM | POA: Insufficient documentation

## 2022-04-17 NOTE — Therapy (Signed)
OUTPATIENT PHYSICAL THERAPY SOZO SCREENING NOTE   Patient Name: Evelyn Marshall MRN: [id number] DOB:[birth year], 37 y.o., female Today's Date: 04/17/2022  PCP: [name], MD REFERRING PROVIDER: Rolm Bookbinder, MD   PT End of Session - 04/17/22 [phone]     Visit Number 2   # unchanged due to screen only   PT Start Time 0923    PT Stop Time 0928    PT Time Calculation (min) 5 min    Activity Tolerance Patient tolerated treatment well    Behavior During Therapy [facility] for tasks assessed/performed             Past Medical History:  Diagnosis Date   Abnormal Pap smear 2012   colpo result benign   Cancer ([place])    Chlamydia    Family history of breast cancer 12/15/2020   History of pre-eclampsia    HPV (human papilloma virus) infection    Hyperemesis    Hypokalemia    Pregnancy induced hypertension    Ptyalism    Vaginal Pap smear, abnormal    Past Surgical History:  Procedure Laterality Date   BREAST REDUCTION SURGERY Left 07/05/2021   Procedure: LEFT MAMMARY REDUCTION  (BREAST);  Surgeon: Irene Limbo, MD;  Location: [place];  Service: Plastics;  Laterality: Left;   COLPOSCOPY     IR IMAGING GUIDED PORT INSERTION  12/24/2020   IR REMOVAL TUN ACCESS W/ PORT W/O FL MOD SED  01/20/2022   NODE DISSECTION Right 07/05/2021   Procedure: RIGHT AXILLARY NODE DISSECTION;  Surgeon: Rolm Bookbinder, MD;  Location: [place];  Service: General;  Laterality: Right;   SIMPLE MASTECTOMY WITH AXILLARY SENTINEL NODE BIOPSY Right 07/05/2021   Procedure: RIGHT MASTECTOMY;  Surgeon: Rolm Bookbinder, MD;  Location: [place];  Service: General;  Laterality: Right;  [place] EXTRACTION     Patient Active Problem List   Diagnosis Date Noted   S/P mastectomy, right 07/05/2021   Postpartum care following vaginal delivery 03/16/2021   Malignant neoplasm affecting pregnancy in third trimester 03/15/2021   Malignant  neoplasm affecting pregnancy, antepartum, third trimester 03/13/2021   Port-A-Cath in place 12/31/2020   Genetic testing 12/27/2020   Malignant neoplasm of upper-outer quadrant of right breast in female, estrogen receptor positive ([place]) 12/15/2020   Family history of breast cancer 12/15/2020   Severe preeclampsia, third trimester 02/25/2019   Hyperemesis gravidarum 08/20/2018   Preeclampsia, third trimester 11/15/2015   Pre-eclampsia during pregnancy in third trimester, antepartum 11/15/2015   Preeclampsia 11/15/2015   Mild preeclampsia 11/12/2015   Hypokalemia, inadequate intake 06/17/2015   Ptyalism 05/18/2015   Chronic hypertension in pregnancy 05/18/2015   Morbid obesity with BMI of 40.0-44.9, adult ([place]) 05/18/2015   Rh negative, maternal 05/18/2015    REFERRING DIAG: right breast cancer at risk for lymphedema  THERAPY DIAG: Aftercare following surgery for neoplasm  PERTINENT HISTORY: She is now post op Right mastectomy on 07/05/2021 with a left Breast reduction. 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 This was a grade 3. The largest remaining tumor was 8 mm and it was multifocal. There were 24 lymph nodes all removed  with an axillary lymph node dissection. 5 of these had macrometastases present in them. This was a less than 1% ER positive, PR negative, HER2 positive tumor. She is presently undergoing radiation and will have 31 treatments  PRECAUTIONS: right UE Lymphedema risk,  None  SUBJECTIVE: Pt returns for her 3 month L-Dex screen.   PAIN:  Are you having pain? No  SOZO SCREENING: Patient was assessed today using the SOZO machine to determine the lymphedema index score. This was compared to her baseline score. It was determined that she is within the recommended range when compared to her baseline and no further action is needed at this time. She will continue SOZO screenings. These  are done every 3 months for 2 years post operatively followed by every 6 months for 2 years, and then annually.  Also answered pts questions regarding when she should wear her compression sleeve and for how long.    L-DEX FLOWSHEETS - 04/17/22 0900       L-DEX LYMPHEDEMA SCREENING   Measurement Type Unilateral    L-DEX MEASUREMENT EXTREMITY Upper Extremity    POSITION  Standing    DOMINANT SIDE Right    At Risk Side Right    BASELINE SCORE (UNILATERAL) 4.2    L-DEX SCORE (UNILATERAL) 2.7    VALUE CHANGE (UNILAT) -1.5              Otelia Limes, PTA 04/17/2022, 9:28 AM

## 2022-04-18 ENCOUNTER — Telehealth: Payer: Self-pay | Admitting: Adult Health

## 2022-04-18 NOTE — Telephone Encounter (Signed)
Scheduled appointments per 3/21 los. Patient is aware of the made appointments.

## 2022-04-25 ENCOUNTER — Other Ambulatory Visit: Payer: Self-pay

## 2022-05-11 ENCOUNTER — Inpatient Hospital Stay: Payer: BC Managed Care – PPO | Attending: Hematology and Oncology

## 2022-05-11 VITALS — BP 124/57 | HR 72 | Temp 98.4°F | Resp 18

## 2022-05-11 DIAGNOSIS — C50411 Malignant neoplasm of upper-outer quadrant of right female breast: Secondary | ICD-10-CM

## 2022-05-11 DIAGNOSIS — Z5111 Encounter for antineoplastic chemotherapy: Secondary | ICD-10-CM | POA: Diagnosis present

## 2022-05-11 DIAGNOSIS — Z95828 Presence of other vascular implants and grafts: Secondary | ICD-10-CM

## 2022-05-11 DIAGNOSIS — Z170 Estrogen receptor positive status [ER+]: Secondary | ICD-10-CM

## 2022-05-11 MED ORDER — GOSERELIN ACETATE 3.6 MG ~~LOC~~ IMPL
3.6000 mg | DRUG_IMPLANT | Freq: Once | SUBCUTANEOUS | Status: AC
  Administered 2022-05-11: 3.6 mg via SUBCUTANEOUS
  Filled 2022-05-11: qty 3.6

## 2022-05-30 ENCOUNTER — Other Ambulatory Visit: Payer: Self-pay | Admitting: Internal Medicine

## 2022-06-08 ENCOUNTER — Other Ambulatory Visit: Payer: Self-pay

## 2022-06-08 ENCOUNTER — Inpatient Hospital Stay: Payer: BC Managed Care – PPO | Attending: Hematology and Oncology

## 2022-06-08 VITALS — BP 138/88 | HR 81 | Temp 98.2°F | Resp 18

## 2022-06-08 DIAGNOSIS — Z5111 Encounter for antineoplastic chemotherapy: Secondary | ICD-10-CM | POA: Diagnosis present

## 2022-06-08 DIAGNOSIS — C50411 Malignant neoplasm of upper-outer quadrant of right female breast: Secondary | ICD-10-CM | POA: Diagnosis present

## 2022-06-08 DIAGNOSIS — Z95828 Presence of other vascular implants and grafts: Secondary | ICD-10-CM

## 2022-06-08 DIAGNOSIS — Z170 Estrogen receptor positive status [ER+]: Secondary | ICD-10-CM

## 2022-06-08 MED ORDER — GOSERELIN ACETATE 3.6 MG ~~LOC~~ IMPL
3.6000 mg | DRUG_IMPLANT | Freq: Once | SUBCUTANEOUS | Status: AC
  Administered 2022-06-08: 3.6 mg via SUBCUTANEOUS
  Filled 2022-06-08: qty 3.6

## 2022-06-08 NOTE — Patient Instructions (Signed)
Goserelin Implant What is this medication? GOSERELIN (GOE se rel in) treats prostate cancer and breast cancer. It works by decreasing levels of the hormones testosterone and estrogen in the body. This prevents prostate and breast cancer cells from spreading or growing. It may also be used to treat endometriosis. This is a condition where the tissue that lines the uterus grows outside the uterus. It works by decreasing the amount of estrogen your body makes, which reduces heavy bleeding and pain. It can also be used to help thin the lining of the uterus before a surgery used to prevent or reduce heavy periods. This medicine may be used for other purposes; ask your health care provider or pharmacist if you have questions. COMMON BRAND NAME(S): Zoladex, Zoladex 3-Month What should I tell my care team before I take this medication? They need to know if you have any of these conditions: Bone problems Diabetes Heart disease History of irregular heartbeat or rhythm An unusual or allergic reaction to goserelin, other medications, foods, dyes, or preservatives Pregnant or trying to get pregnant Breastfeeding How should I use this medication? This medication is injected under the skin. It is given by your care team in a hospital or clinic setting. Talk to your care team about the use of this medication in children. Special care may be needed. Overdosage: If you think you have taken too much of this medicine contact a poison control center or emergency room at once. NOTE: This medicine is only for you. Do not share this medicine with others. What if I miss a dose? Keep appointments for follow-up doses. It is important not to miss your dose. Call your care team if you are unable to keep an appointment. What may interact with this medication? Do not take this medication with any of the following: Cisapride Dronedarone Pimozide Thioridazine This medication may also interact with the following: Other  medications that cause heart rhythm changes This list may not describe all possible interactions. Give your health care provider a list of all the medicines, herbs, non-prescription drugs, or dietary supplements you use. Also tell them if you smoke, drink alcohol, or use illegal drugs. Some items may interact with your medicine. What should I watch for while using this medication? Visit your care team for regular checks on your progress. Your symptoms may appear to get worse during the first weeks of this therapy. Tell your care team if your symptoms do not start to get better or if they get worse after this time. Using this medication for a long time may weaken your bones. If you smoke or frequently drink alcohol you may increase your risk of bone loss. A family history of osteoporosis, chronic use of medications for seizures (convulsions), or corticosteroids can also increase your risk of bone loss. The risk of bone fractures may be increased. Talk to your care team about your bone health. This medication may increase blood sugar. The risk may be higher in patients who already have diabetes. Ask your care team what you can do to lower your risk of diabetes while taking this medication. This medication should stop regular monthly menstruation in women. Tell your care team if you continue to menstruate. Talk to your care team if you wish to become pregnant or think you might be pregnant. This medication can cause serious birth defects if taken during pregnancy or for 12 weeks after stopping treatment. Talk to your care team about reliable forms of contraception. Do not breastfeed while taking this   medication. This medication may cause infertility. Talk to your care team if you are concerned about your fertility. What side effects may I notice from receiving this medication? Side effects that you should report to your care team as soon as possible: Allergic reactions--skin rash, itching, hives, swelling  of the face, lips, tongue, or throat Change in the amount of urine Heart attack--pain or tightness in the chest, shoulders, arms, or jaw, nausea, shortness of breath, cold or clammy skin, feeling faint or lightheaded Heart rhythm changes--fast or irregular heartbeat, dizziness, feeling faint or lightheaded, chest pain, trouble breathing High blood sugar (hyperglycemia)--increased thirst or amount of urine, unusual weakness or fatigue, blurry vision High calcium level--increased thirst or amount of urine, nausea, vomiting, confusion, unusual weakness or fatigue, bone pain Pain, redness, irritation, or bruising at the injection site Severe back pain, numbness or weakness of the hands, arms, legs, or feet, loss of coordination, loss of bowel or bladder control Stroke--sudden numbness or weakness of the face, arm, or leg, trouble speaking, confusion, trouble walking, loss of balance or coordination, dizziness, severe headache, change in vision Swelling and pain of the tumor site or lymph nodes Trouble passing urine Side effects that usually do not require medical attention (report to your care team if they continue or are bothersome): Change in sex drive or performance Headache Hot flashes Rapid or extreme change in emotion or mood Sweating Swelling of the ankles, hands, or feet Unusual vaginal discharge, itching, or odor This list may not describe all possible side effects. Call your doctor for medical advice about side effects. You may report side effects to FDA at 1-800-FDA-1088. Where should I keep my medication? This medication is given in a hospital or clinic. It will not be stored at home. NOTE: This sheet is a summary. It may not cover all possible information. If you have questions about this medicine, talk to your doctor, pharmacist, or health care provider.  2023 Elsevier/Gold Standard (2021-05-25 00:00:00)  

## 2022-07-06 ENCOUNTER — Other Ambulatory Visit: Payer: Self-pay

## 2022-07-06 ENCOUNTER — Inpatient Hospital Stay: Payer: BC Managed Care – PPO | Attending: Hematology and Oncology

## 2022-07-06 VITALS — BP 126/85 | HR 72 | Temp 98.2°F | Resp 20

## 2022-07-06 DIAGNOSIS — C50411 Malignant neoplasm of upper-outer quadrant of right female breast: Secondary | ICD-10-CM

## 2022-07-06 DIAGNOSIS — Z5111 Encounter for antineoplastic chemotherapy: Secondary | ICD-10-CM | POA: Insufficient documentation

## 2022-07-06 DIAGNOSIS — Z95828 Presence of other vascular implants and grafts: Secondary | ICD-10-CM

## 2022-07-06 DIAGNOSIS — Z170 Estrogen receptor positive status [ER+]: Secondary | ICD-10-CM

## 2022-07-06 MED ORDER — GOSERELIN ACETATE 3.6 MG ~~LOC~~ IMPL
3.6000 mg | DRUG_IMPLANT | Freq: Once | SUBCUTANEOUS | Status: AC
  Administered 2022-07-06: 3.6 mg via SUBCUTANEOUS
  Filled 2022-07-06: qty 3.6

## 2022-07-07 ENCOUNTER — Ambulatory Visit: Payer: BC Managed Care – PPO | Attending: Internal Medicine | Admitting: Internal Medicine

## 2022-07-07 VITALS — BP 132/84 | HR 67 | Ht 64.0 in | Wt 225.8 lb

## 2022-07-07 DIAGNOSIS — Z9221 Personal history of antineoplastic chemotherapy: Secondary | ICD-10-CM

## 2022-07-07 DIAGNOSIS — I10 Essential (primary) hypertension: Secondary | ICD-10-CM

## 2022-07-07 NOTE — Patient Instructions (Signed)
Medication Instructions:  No changes *If you need a refill on your cardiac medications before your next appointment, please call your pharmacy*  Testing/Procedures: Your physician has requested that you have an Limited Echocardiogram in March 2025. Echocardiography is a painless test that uses sound waves to create images of your heart. It provides your doctor with information about the size and shape of your heart and how well your heart's chambers and valves are working. This procedure takes approximately one hour. There are no restrictions for this procedure. Please do NOT wear cologne, perfume, aftershave, or lotions (deodorant is allowed). Please arrive 15 minutes prior to your appointment time.    Follow-Up: At [facility], you and your health needs are our priority.  As part of our continuing mission to provide you with exceptional heart care, we have created designated Provider Care Teams.  These Care Teams include your primary Cardiologist (physician) and Advanced Practice Providers (APPs -  Physician Assistants and Nurse Practitioners) who all work together to provide you with the care you need, when you need it.  We recommend signing up for the patient portal called "MyChart".  Sign up information is provided on this After Visit Summary.  MyChart is used to connect with patients for Virtual Visits (Telemedicine).  Patients are able to view lab/test results, encounter notes, upcoming appointments, etc.  Non-urgent messages can be sent to your provider as well.   To learn more about what you can do with MyChart, go to [email or web address].    Your next appointment:   1 year(s)  Provider:   Maisie Fus, MD

## 2022-07-07 NOTE — Progress Notes (Signed)
Cardiology Office Note:    Date:  07/07/2022   ID:  RITIKA SCHMELZ, DOB 12/06/1985, MRN [id number]  PCP:  Caffie Damme, MD   [facility] Providers Cardiologist:  Maisie Fus, MD     Referring MD: Caffie Damme, MD   No chief complaint on file. Cardiotoxic Breast Cancer therapy  History of Present Illness:    Evelyn Marshall is a 36 y.o. female with a hx of Stage IV right breast cancer, HTN, hx preeclampsia referral for cardiotoxicity surveillance while on chemotherapy  She recently delivered a baby boy 03/15/2021 with induction of labor at 34 weeks. She had hypertension during pregnancy and hx of preeclampsia. She was managed with magnesium sulfate and labetalol.  She checks her blood pressures at home. Most recent was 118/70 mmHg. She denies CP, shortness of breath. She denies PND, orthopnea, and no LE edema. She did a lot of walking after the delivery.  She has hx of Stage IV ER+ , HER2+ breast cancer RUQ.  AC Q21 days/Trastuzumab + Pertuzumab+ Weekly Paclitaxel. She was diagnosed November, 16th 2022. She was pregnant. Started with AC. She started anti-her2 recently post delivery day 1 cycle 1 03/25/2021. She is planned for adjuvant radiation on the right and adjuvant antiestrogen therapy with ovarian function suppression. Notably she had proteinuria during her pregnancy  Family Hx: Father had hx of congestive HF and on LVAD HM III. Still smoking so not on transplant list. She notes hx of hypertension. Sibling with diabetes.   Social Hx: She has four boys. She is on medical leave. She's a Runner, broadcasting/film/video, teaching special ed. No smoking hx. No etoh use  Interim Hx: She has a BP cuff at home. She notes her blood pressures are more stable.Typically SBP is < 130. She denies  SOB, PND or orthopnea, no LE edema. She is planned for right mastectomy soon with Dr. Dwain Sarna.  Interim Hx 01/05/2022 Noted that she had hypothyroid found at [facility], started on synthroid. She is working on  weight loss.   Interim hx 07/07/2022 She's on anastrozole.  She is checking her blood pressure. She denies CP or SOB.    Past Medical History:  Diagnosis Date   Abnormal Pap smear 2012   colpo result benign   Cancer ([facility])    Chlamydia    Family history of breast cancer 12/15/2020   History of pre-eclampsia    HPV (human papilloma virus) infection    Hyperemesis    Hypokalemia    Pregnancy induced hypertension    Ptyalism    Vaginal Pap smear, abnormal     Past Surgical History:  Procedure Laterality Date   BREAST REDUCTION SURGERY Left 07/05/2021   Procedure: LEFT MAMMARY REDUCTION  (BREAST);  Surgeon: Glenna Fellows, MD;  Location: [facility];  Service: Plastics;  Laterality: Left;   COLPOSCOPY     IR IMAGING GUIDED PORT INSERTION  12/24/2020   IR REMOVAL TUN ACCESS W/ PORT W/O FL MOD SED  01/20/2022   NODE DISSECTION Right 07/05/2021   Procedure: RIGHT AXILLARY NODE DISSECTION;  Surgeon: Emelia Loron, MD;  Location: [facility];  Service: General;  Laterality: Right;   SIMPLE MASTECTOMY WITH AXILLARY SENTINEL NODE BIOPSY Right 07/05/2021   Procedure: RIGHT MASTECTOMY;  Surgeon: Emelia Loron, MD;  Location: [facility];  Service: General;  Laterality: Right;  GEN & PEC BLOCK   WISDOM TOOTH EXTRACTION      Current Medications: Current Outpatient Medications on File Prior  to Visit  Medication Sig Dispense Refill   anastrozole (ARIMIDEX) 1 MG tablet Take 1 tablet (1 mg total) by mouth daily. 90 tablet 3   carvedilol (COREG) 25 MG tablet Take 1 tablet (25 mg total) by mouth 2 (two) times daily with a meal. 180 tablet 3   losartan (COZAAR) 25 MG tablet TAKE 2 TABLETS(50 MG) BY MOUTH DAILY (Patient taking differently: Take 25 mg by mouth daily. Pt taking 1 tablet daily) 180 tablet 1   ibuprofen (ADVIL) 600 MG tablet Take 600 mg by mouth every 6 (six) hours as needed. (Patient not taking: Reported on 07/07/2022)      levothyroxine (SYNTHROID) 75 MCG tablet Take 75 mcg by mouth every morning. (Patient not taking: Reported on 07/07/2022)     lidocaine-prilocaine (EMLA) cream Apply 1 application. topically as needed (apply once weekly prior to port a cath needle insertion.). (Patient not taking: Reported on 07/07/2022) 30 g 0   ondansetron (ZOFRAN) 8 MG tablet Take 1 tablet (8 mg total) by mouth every 8 (eight) hours as needed for nausea or vomiting. (Patient not taking: Reported on 07/07/2022) 20 tablet 1   No current facility-administered medications on file prior to visit.     Allergies:   Patient has no known allergies.   Social History   Socioeconomic History   Marital status: Married    Spouse name: Earl Lites   Number of children: 3   Years of education: 16   Highest education level: Bachelor's degree (e.g., BA, AB, BS)  Occupational History   Occupation: Runner, broadcasting/film/video  Tobacco Use   Smoking status: Never   Smokeless tobacco: Never  Vaping Use   Vaping Use: Never used  Substance and Sexual Activity   Alcohol use: No   Drug use: No   Sexual activity: Yes  Other Topics Concern   Not on file  Social History Narrative   Not on file   Social Determinants of Health   Financial Resource Strain: Not on file  Food Insecurity: Not on file  Transportation Needs: Not on file  Physical Activity: Not on file  Stress: Not on file  Social Connections: Not on file     Family History: The patient's family history includes Breast cancer in her paternal grandmother; Breast cancer (age of onset: 50) in her cousin; Heart disease in her father; Hypertension in her father; Pancreatic cancer (age of onset: 16) in her maternal aunt; Prostate cancer in her paternal uncle and paternal uncle; Stroke in her father and paternal grandmother.  ROS:   Please see the history of present illness.      All other systems reviewed and are negative.  EKGs/Labs/Other Studies Reviewed:    The following studies were reviewed  today:   EKG:  EKG is  ordered today.  The ekg ordered today demonstrates   07/04/2021-NSR, Qtc   Recent Labs: 08/12/2021: Magnesium 1.7 01/04/2022: BNP 7.6 04/13/2022: ALT 17; BUN 13; Creatinine 0.78; Hemoglobin 10.9; Platelet Count 282; Potassium 4.1; Sodium 136   Recent Lipid Panel No results found for: "CHOL", "TRIG", "HDL", "CHOLHDL", "VLDL", "LDLCALC", "LDLDIRECT"   Risk Assessment/Calculations:           Physical Exam:    VS:   Vitals:   07/07/22 0922  BP: 132/84  Pulse: 67  SpO2: 96%     Wt Readings from Last 3 Encounters:  07/07/22 225 lb 12.8 oz (102.4 kg)  04/17/22 224 lb 2 oz (101.7 kg)  04/13/22 226 lb 8 oz (102.7 kg)  GEN:  Well nourished, well developed in no acute distress HEENT: Normal NECK: No JVD CARDIAC: RRR, no murmurs, rubs, gallops RESPIRATORY:  Clear to auscultation without rales, wheezing or rhonchi  ABDOMEN:  non-distended MUSCULOSKELETAL:  No edema; No deformity  SKIN: Warm and dry NEUROLOGIC:  Alert and oriented x 3 PSYCHIATRIC:  Normal affect   ASSESSMENT:    #HTN: Good control. She had reduced strain on echo when I first met her; I changed her regimen to cardio-protective agents while on trastuzumab. Blood pressures have been well controlled. - s/p labetalol, nifedipine 30 mg daily - continue losartan 25 mg daily and carvedilol 25 mg BID  #Hypothyroidism: on levothyroxine 0.75 mg daily  #Weightloss: discussed health tracker. She was recommended to do 1600 calorie. Avoid carbs/sugars. Intermittent fasting. Group/sponsor weight loss help, exercise class/ [facility]es [facility]  #Cardio Oncology R BC; HER2+ BC; planned for [facility] and trastuzumab therapy; zoladex with anastrozole 10/28/2021 There is ~3% risk of clinical heart failure and ~ 20 % risk of asymptomatic cardiac dysfunction on AC and herceptin. Further, she may be at increased risk with  family hx of CHF and pre-eclampsia. Will aim for Bps closer to 120/80 mmHg. She  is on cardio-protective agents. Low risk for radiation cardiotoxicity ( R sided) Chemo AC initiation : 12/24/2020- 02/23/2021 Cumulative Dose: 220 mg/m2 Anti-HER2 Initiation: 03/25/2021- switched to kadcyla in July Surveillance:  - baseline echo- normal, mild LVH - BNP normal in March -  follow up echo in 3 months 06/2021 showed reduced GLS -16 from -21.4%;  continued on losartan 50 mg daily from 25 mg daily. - 12/132023: TTE - EF 55-60%, -21.4% (improved) - TTE 03/2022: TTE- EF 65-70%, GLS -21% - will do 12 M FU post HER2 TTE 03/2023 - BNP 7.6   PLAN:    In order of problems listed above:   Limited TTE with GLS 03/2023 Follow up 12 months      Medication Adjustments/Labs and Tests Ordered: Current medicines are reviewed at length with the patient today.  Concerns regarding medicines are outlined above.  Orders Placed This Encounter  Procedures   EKG 12-Lead   ECHOCARDIOGRAM LIMITED   No orders of the defined types were placed in this encounter.   Patient Instructions  Medication Instructions:  No changes *If you need a refill on your cardiac medications before your next appointment, please call your pharmacy*  Testing/Procedures: Your physician has requested that you have an Limited Echocardiogram in March 2025. Echocardiography is a painless test that uses sound waves to create images of your heart. It provides your doctor with information about the size and shape of your heart and how well your heart's chambers and valves are working. This procedure takes approximately one hour. There are no restrictions for this procedure. Please do NOT wear cologne, perfume, aftershave, or lotions (deodorant is allowed). Please arrive 15 minutes prior to your appointment time.    Follow-Up: At [facility], you and your health needs are our priority.  As part of our continuing mission to provide you with exceptional heart care, we have created designated Provider Care Teams.  These  Care Teams include your primary Cardiologist (physician) and Advanced Practice Providers (APPs -  Physician Assistants and Nurse Practitioners) who all work together to provide you with the care you need, when you need it.  We recommend signing up for the patient portal called "MyChart".  Sign up information is provided on this After Visit Summary.  MyChart is used to  connect with patients for Virtual Visits (Telemedicine).  Patients are able to view lab/test results, encounter notes, upcoming appointments, etc.  Non-urgent messages can be sent to your provider as well.   To learn more about what you can do with MyChart, go to [email or web address].    Your next appointment:   1 year(s)  Provider:   Maisie Fus, MD       Signed, Maisie Fus, MD  07/07/2022 10:26 AM     [facility]

## 2022-07-08 ENCOUNTER — Other Ambulatory Visit: Payer: Self-pay

## 2022-07-17 ENCOUNTER — Ambulatory Visit: Payer: BC Managed Care – PPO

## 2022-07-17 ENCOUNTER — Ambulatory Visit: Payer: BC Managed Care – PPO | Attending: General Surgery

## 2022-07-17 VITALS — Wt 226.5 lb

## 2022-07-17 DIAGNOSIS — Z483 Aftercare following surgery for neoplasm: Secondary | ICD-10-CM | POA: Insufficient documentation

## 2022-07-17 NOTE — Therapy (Signed)
OUTPATIENT PHYSICAL THERAPY SOZO SCREENING NOTE   Patient Name: Evelyn Marshall MRN: [id number] DOB:1985-03-02, 37 y.o., female Today's Date: 07/17/2022  PCP: Caffie Damme, MD REFERRING PROVIDER: Emelia Loron, MD   PT End of Session - 07/17/22 0957     Visit Number 2   # unchanged due to screen only   PT Start Time 0955    PT Stop Time 0958    PT Time Calculation (min) 3 min    Activity Tolerance Patient tolerated treatment well    Behavior During Therapy [facility] for tasks assessed/performed             Past Medical History:  Diagnosis Date   Abnormal Pap smear 2012   colpo result benign   Cancer (HCC)    Chlamydia    Family history of breast cancer 12/15/2020   History of pre-eclampsia    HPV (human papilloma virus) infection    Hyperemesis    Hypokalemia    Pregnancy induced hypertension    Ptyalism    Vaginal Pap smear, abnormal    Past Surgical History:  Procedure Laterality Date   BREAST REDUCTION SURGERY Left 07/05/2021   Procedure: LEFT MAMMARY REDUCTION  (BREAST);  Surgeon: Glenna Fellows, MD;  Location: [facility];  Service: Plastics;  Laterality: Left;   COLPOSCOPY     IR IMAGING GUIDED PORT INSERTION  12/24/2020   IR REMOVAL TUN ACCESS W/ PORT W/O FL MOD SED  01/20/2022   NODE DISSECTION Right 07/05/2021   Procedure: RIGHT AXILLARY NODE DISSECTION;  Surgeon: Emelia Loron, MD;  Location: [facility];  Service: General;  Laterality: Right;   SIMPLE MASTECTOMY WITH AXILLARY SENTINEL NODE BIOPSY Right 07/05/2021   Procedure: RIGHT MASTECTOMY;  Surgeon: Emelia Loron, MD;  Location: [facility];  Service: General;  Laterality: Right;  GEN & PEC BLOCK   WISDOM TOOTH EXTRACTION     Patient Active Problem List   Diagnosis Date Noted   S/P mastectomy, right 07/05/2021   Postpartum care following vaginal delivery 03/16/2021   Malignant neoplasm affecting pregnancy in third trimester 03/15/2021   Malignant  neoplasm affecting pregnancy, antepartum, third trimester 03/13/2021   Port-A-Cath in place 12/31/2020   Genetic testing 12/27/2020   Malignant neoplasm of upper-outer quadrant of right breast in female, estrogen receptor positive (HCC) 12/15/2020   Family history of breast cancer 12/15/2020   Severe preeclampsia, third trimester 02/25/2019   Hyperemesis gravidarum 08/20/2018   Preeclampsia, third trimester 11/15/2015   Pre-eclampsia during pregnancy in third trimester, antepartum 11/15/2015   Preeclampsia 11/15/2015   Mild preeclampsia 11/12/2015   Hypokalemia, inadequate intake 06/17/2015   Ptyalism 05/18/2015   Chronic hypertension in pregnancy 05/18/2015   Morbid obesity with BMI of 40.0-44.9, adult (HCC) 05/18/2015   Rh negative, maternal 05/18/2015    REFERRING DIAG: right breast cancer at risk for lymphedema  THERAPY DIAG: Aftercare following surgery for neoplasm  PERTINENT HISTORY: She is now post op Right mastectomy on 07/05/2021 with a left Breast reduction. 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 This was a grade 3. The largest remaining tumor was 8 mm and it was multifocal. There were 24 lymph nodes all removed  with an axillary lymph node dissection. 5 of these had macrometastases present in them. This was a less than 1% ER positive, PR negative, HER2 positive tumor. She is presently undergoing radiation and will have 31 treatments  PRECAUTIONS: right UE Lymphedema risk,  None  SUBJECTIVE: Pt returns for her 3 month L-Dex screen.   PAIN:  Are you having pain? No  SOZO SCREENING: Patient was assessed today using the SOZO machine to determine the lymphedema index score. This was compared to her baseline score. It was determined that she is within the recommended range when compared to her baseline and no further action is needed at this time. She will continue SOZO screenings. These  are done every 3 months for 2 years post operatively followed by every 6 months for 2 years, and then annually.  Also answered pts questions regarding when she should wear her compression sleeve and for how long.    L-DEX FLOWSHEETS - 07/17/22 0900       L-DEX LYMPHEDEMA SCREENING   Measurement Type Unilateral    L-DEX MEASUREMENT EXTREMITY Upper Extremity    POSITION  Standing    DOMINANT SIDE Right    At Risk Side Right    BASELINE SCORE (UNILATERAL) 4.2    L-DEX SCORE (UNILATERAL) -2    VALUE CHANGE (UNILAT) -6.2              Hermenia Bers, PTA 07/17/2022, 9:58 AM

## 2022-07-19 ENCOUNTER — Telehealth: Payer: Self-pay

## 2022-07-19 NOTE — Telephone Encounter (Signed)
Called pt per MD to advise [employer] testing was negative/not detected. Pt verbalized understanding of results and knows [employer] will be in touch to schedule 3 mo repeat lab.   

## 2022-07-20 ENCOUNTER — Encounter: Payer: Self-pay | Admitting: Hematology and Oncology

## 2022-08-02 NOTE — Progress Notes (Signed)
Patient Care Team: Caffie Damme, MD as PCP - General (Family Medicine) Maisie Fus, MD as PCP - Cardiology (Cardiology) Serena Croissant, MD as Consulting Physician (Hematology and Oncology) Maxie Better, MD as Consulting Physician (Obstetrics and Gynecology) Glenna Fellows, MD as Consulting Physician (Plastic Surgery) Emelia Loron, MD as Consulting Physician (General Surgery)  DIAGNOSIS: No diagnosis found.  SUMMARY OF ONCOLOGIC HISTORY: Oncology History  Malignant neoplasm of upper-outer quadrant of right breast in female, estrogen receptor positive (HCC)  12/08/2020 Initial Diagnosis   21-week pregnancy: MRI at [facility] noncontrast revealed right axillary mass 3.7 cm, subpectoral lymph nodes, left supraclavicular lymph node, multiple level 2 and 3 lymph nodes in the right axilla, 2 breast masses 1.1 cm largest size.  Mammogram in [place] revealed 3 small masses at 12:00 1.2 cm, 0.6 cm, 0.9 cm and 5 abnormal axillary lymph nodes, pleomorphic calcifications measuring 10 cm, biopsy revealed grade 2 IDC with necrosis and calcifications ER 30%, PR 0%, HER2 positive, Ki-67 30 to 40%   12/15/2020 Cancer Staging   Staging form: Breast, AJCC 8th Edition - Clinical: Stage IIIA (cT3, cN2, cM0, G2, ER+, PR-, HER2+) - Signed by Loa Socks, NP on 04/13/2022 Histologic grading system: 3 grade system   12/24/2020 - 02/23/2021 Chemotherapy   Patient is on Treatment Plan : BREAST Adjuvant AC q21d     12/29/2020 Genetic Testing   Negative hereditary cancer genetic testing: no pathogenic variants detected in [facility] BRCAPlus Panel or [employer] CustomNext-Cancer +RNAinsight Panel.  The report dates are 12/23/2020 and 12/29/2020.   The BRCAplus panel offered by [employer] and includes sequencing and deletion/duplication analysis for the following 8 genes: ATM, BRCA1, BRCA2, CDH1, CHEK2, PALB2, PTEN, and TP53.  The CustomNext-Cancer+RNAinsight panel offered by [employer] includes  sequencing and rearrangement analysis for the following 47 genes:  APC, ATM, AXIN2, BARD1, BMPR1A, BRCA1, BRCA2, BRIP1, CDH1, CDK4, CDKN2A, CHEK2, DICER1, EPCAM, GREM1, HOXB13, MEN1, MLH1, MSH2, MSH3, MSH6, MUTYH, NBN, NF1, NF2, NTHL1, PALB2, PMS2, POLD1, POLE, PTEN, RAD51C, RAD51D, RECQL, RET, SDHA, SDHAF2, SDHB, SDHC, SDHD, SMAD4, SMARCA4, STK11, TP53, TSC1, TSC2, and VHL.  RNA data is routinely analyzed for use in variant interpretation for all genes.   03/25/2021 - 06/30/2021 Chemotherapy   Patient is on Treatment Plan : BREAST AC q21 days / Trastuzumab + Pertuzumab + Weekly Paclitaxel q21d     07/05/2021 Surgery   8 mm residual HG IDC with LVI, Margins Neg    07/29/2021 -  Chemotherapy   Patient is on Treatment Plan : BREAST ADO-Trastuzumab Emtansine (Kadcyla) q21d     08/22/2021 - 09/27/2021 Radiation Therapy   Right Chest Wall: 50 Gy in 25 treatments   10/2021 -  Anti-estrogen oral therapy   Anastrozole     CHIEF COMPLIANT:   INTERVAL HISTORY: Evelyn Marshall is a   ALLERGIES:  has No Known Allergies.  MEDICATIONS:  Current Outpatient Medications  Medication Sig Dispense Refill   anastrozole (ARIMIDEX) 1 MG tablet Take 1 tablet (1 mg total) by mouth daily. 90 tablet 3   carvedilol (COREG) 25 MG tablet Take 1 tablet (25 mg total) by mouth 2 (two) times daily with a meal. 180 tablet 3   ibuprofen (ADVIL) 600 MG tablet Take 600 mg by mouth every 6 (six) hours as needed. (Patient not taking: Reported on 07/07/2022)     levothyroxine (SYNTHROID) 75 MCG tablet Take 75 mcg by mouth every morning. (Patient not taking: Reported on 07/07/2022)     lidocaine-prilocaine (EMLA) cream Apply  1 application. topically as needed (apply once weekly prior to port a cath needle insertion.). (Patient not taking: Reported on 07/07/2022) 30 g 0   losartan (COZAAR) 25 MG tablet TAKE 2 TABLETS(50 MG) BY MOUTH DAILY (Patient taking differently: Take 25 mg by mouth daily. Pt taking 1 tablet daily) 180 tablet 1    ondansetron (ZOFRAN) 8 MG tablet Take 1 tablet (8 mg total) by mouth every 8 (eight) hours as needed for nausea or vomiting. (Patient not taking: Reported on 07/07/2022) 20 tablet 1   No current facility-administered medications for this visit.    PHYSICAL EXAMINATION: ECOG PERFORMANCE STATUS: {CHL ONC ECOG [phone]}  There were no vitals filed for this visit. There were no vitals filed for this visit.  BREAST:*** No palpable masses or nodules in either right or left breasts. No palpable axillary supraclavicular or infraclavicular adenopathy no breast tenderness or nipple discharge. (exam performed in the presence of a chaperone)  LABORATORY DATA:  I have reviewed the data as listed    Latest Ref Rng & Units 04/13/2022    1:49 PM 01/13/2022    9:22 AM 12/23/2021    9:49 AM  CMP  Glucose 70 - 99 mg/dL 88  409  95   BUN 6 - 20 mg/dL 13  16  11    Creatinine 0.44 - 1.00 mg/dL 8.11  9.14  7.82   Sodium 135 - 145 mmol/L 136  137  137   Potassium 3.5 - 5.1 mmol/L 4.1  3.8  4.2   Chloride 98 - 111 mmol/L 97  102  104   CO2 22 - 32 mmol/L 31  29  27    Calcium 8.9 - 10.3 mg/dL 9.9  9.9  9.9   Total Protein 6.5 - 8.1 g/dL 7.8  8.1  8.5   Total Bilirubin 0.3 - 1.2 mg/dL 0.4  0.6  0.5   Alkaline Phos 38 - 126 U/L 108  134  139   AST 15 - 41 U/L 24  73  48   ALT 0 - 44 U/L 17  36  29     Lab Results  Component Value Date   WBC 6.0 04/13/2022   HGB 10.9 (L) 04/13/2022   HCT 32.9 (L) 04/13/2022   MCV 76.9 (L) 04/13/2022   PLT 282 04/13/2022   NEUTROABS 3.6 04/13/2022    ASSESSMENT & PLAN:  No problem-specific Assessment & Plan notes found for this encounter.    No orders of the defined types were placed in this encounter.  The patient has a good understanding of the overall plan. she agrees with it. she will call with any problems that may develop before the next visit here. Total time spent: 30 mins including face to face time and time spent for planning, charting and  co-ordination of care   Sherlyn Lick, CMA 08/02/22    I Janan Ridge am acting as a Neurosurgeon for [employer]  ***

## 2022-08-03 ENCOUNTER — Inpatient Hospital Stay: Payer: BC Managed Care – PPO

## 2022-08-03 ENCOUNTER — Other Ambulatory Visit: Payer: Self-pay

## 2022-08-03 ENCOUNTER — Inpatient Hospital Stay: Payer: BC Managed Care – PPO | Attending: Hematology and Oncology | Admitting: Hematology and Oncology

## 2022-08-03 ENCOUNTER — Other Ambulatory Visit: Payer: Self-pay | Admitting: Hematology and Oncology

## 2022-08-03 VITALS — BP 114/76 | HR 66 | Temp 97.5°F | Resp 18 | Ht 64.0 in | Wt 219.6 lb

## 2022-08-03 DIAGNOSIS — Z5111 Encounter for antineoplastic chemotherapy: Secondary | ICD-10-CM | POA: Diagnosis present

## 2022-08-03 DIAGNOSIS — I10 Essential (primary) hypertension: Secondary | ICD-10-CM | POA: Diagnosis not present

## 2022-08-03 DIAGNOSIS — C50411 Malignant neoplasm of upper-outer quadrant of right female breast: Secondary | ICD-10-CM

## 2022-08-03 DIAGNOSIS — Z170 Estrogen receptor positive status [ER+]: Secondary | ICD-10-CM | POA: Insufficient documentation

## 2022-08-03 DIAGNOSIS — D509 Iron deficiency anemia, unspecified: Secondary | ICD-10-CM | POA: Diagnosis not present

## 2022-08-03 DIAGNOSIS — Z79811 Long term (current) use of aromatase inhibitors: Secondary | ICD-10-CM | POA: Insufficient documentation

## 2022-08-03 DIAGNOSIS — Z95828 Presence of other vascular implants and grafts: Secondary | ICD-10-CM

## 2022-08-03 MED ORDER — LOSARTAN POTASSIUM 25 MG PO TABS
25.0000 mg | ORAL_TABLET | Freq: Every day | ORAL | Status: DC
Start: 2022-08-03 — End: 2022-11-27

## 2022-08-03 MED ORDER — GOSERELIN ACETATE 3.6 MG ~~LOC~~ IMPL
3.6000 mg | DRUG_IMPLANT | Freq: Once | SUBCUTANEOUS | Status: AC
  Administered 2022-08-03: 3.6 mg via SUBCUTANEOUS
  Filled 2022-08-03: qty 3.6

## 2022-08-03 NOTE — Assessment & Plan Note (Signed)
12/08/2020:21-week pregnancy: MRI at [facility] noncontrast revealed right axillary mass 3.7 cm, subpectoral lymph nodes, left supraclavicular lymph node, multiple level 2 and 3 lymph nodes in the right axilla, 2 breast masses 1.1 cm largest size.  Mammogram in [place] revealed 3 small masses at 12:00 1.2 cm, 0.6 cm, 0.9 cm and 5 abnormal axillary lymph nodes, pleomorphic calcifications measuring 10 cm, biopsy revealed grade 2 IDC with necrosis and calcifications ER 30%, PR 0%, HER2 positive, Ki-67 30 to 40%   PET/CT 03/24/2021: Multifocal right breast cancer, hypermetabolic right axillary, subpectoral, bilateral supraclavicular and mediastinal lymph nodes   Treatment plan: 1.  Neoadjuvant chemotherapy with Adriamycin and Cytoxan every 3 weeks x4 started 12/24/2020 completed 02/23/2021 2. delivery of the baby 3.  Continue neoadjuvant therapy with Taxol Herceptin and Perjeta (Taxol completed 06/09/21) 4.  Mastectomy with ALND:  07/05/21: 8 mm residual HG IDC with LVI, Margins Neg 4.  Kadcyla started 07/29/2021 5.  Adjuvant radiation therapy 08/23/2021 - 10/03/2021 6.  Adjuvant antiestrogen therapy: Zoladex with anastrozole started 10/28/2021 Genetic testing: Negative ---------------------------------------------------------------------------------------------------------------------------------- Current treatment:  1. Kadcyla started 07/29/2021, today is cycle 8 completing treatment 01/12/2022 2. Zoladex with anastrozole started 10/28/2021: Tolerating extremely well without any problems or concerns  Anemia: Microcytic: With normal iron studies.  Takes 1 tablet oral iron   08/09/2021: PET CT scan: Right mastectomy and right axillary lymph node dissection: Postoperative changes and seroma.  No convincing evidence of distant metastatic disease.   Breast cancer surveillance: Mammogram 12/24/2021 at [facility]: Benign breast density category B Breast exam 08/03/2022: Benign [employer] minimal residual disease monitoring  07/05/2022: Negative  Return Dignicap 1 year for follow

## 2022-08-11 ENCOUNTER — Telehealth: Payer: Self-pay | Admitting: Hematology and Oncology

## 2022-08-11 NOTE — Telephone Encounter (Signed)
Scheduled appointments pr 7/11 los. Patient is aware of the made appointments.

## 2022-08-31 ENCOUNTER — Inpatient Hospital Stay: Payer: BC Managed Care – PPO | Attending: Hematology and Oncology

## 2022-08-31 DIAGNOSIS — C50411 Malignant neoplasm of upper-outer quadrant of right female breast: Secondary | ICD-10-CM | POA: Diagnosis present

## 2022-08-31 DIAGNOSIS — Z5111 Encounter for antineoplastic chemotherapy: Secondary | ICD-10-CM | POA: Diagnosis present

## 2022-08-31 DIAGNOSIS — Z95828 Presence of other vascular implants and grafts: Secondary | ICD-10-CM

## 2022-08-31 MED ORDER — GOSERELIN ACETATE 3.6 MG ~~LOC~~ IMPL
3.6000 mg | DRUG_IMPLANT | Freq: Once | SUBCUTANEOUS | Status: AC
  Administered 2022-08-31: 3.6 mg via SUBCUTANEOUS
  Filled 2022-08-31: qty 3.6

## 2022-08-31 NOTE — Patient Instructions (Signed)
Goserelin Implant What is this medication? GOSERELIN (GOE se rel in) treats prostate cancer and breast cancer. It works by decreasing levels of the hormones testosterone and estrogen in the body. This prevents prostate and breast cancer cells from spreading or growing. It may also be used to treat endometriosis. This is a condition where the tissue that lines the uterus grows outside the uterus. It works by decreasing the amount of estrogen your body makes, which reduces heavy bleeding and pain. It can also be used to help thin the lining of the uterus before a surgery used to prevent or reduce heavy periods. This medicine may be used for other purposes; ask your health care provider or pharmacist if you have questions. COMMON BRAND NAME(S): Zoladex, Zoladex 3-Month What should I tell my care team before I take this medication? They need to know if you have any of these conditions: Bone problems Diabetes Heart disease History of irregular heartbeat or rhythm An unusual or allergic reaction to goserelin, other medications, foods, dyes, or preservatives Pregnant or trying to get pregnant Breastfeeding How should I use this medication? This medication is injected under the skin. It is given by your care team in a hospital or clinic setting. Talk to your care team about the use of this medication in children. Special care may be needed. Overdosage: If you think you have taken too much of this medicine contact a poison control center or emergency room at once. NOTE: This medicine is only for you. Do not share this medicine with others. What if I miss a dose? Keep appointments for follow-up doses. It is important not to miss your dose. Call your care team if you are unable to keep an appointment. What may interact with this medication? Do not take this medication with any of the following: Cisapride Dronedarone Pimozide Thioridazine This medication may also interact with the following: Other  medications that cause heart rhythm changes This list may not describe all possible interactions. Give your health care provider a list of all the medicines, herbs, non-prescription drugs, or dietary supplements you use. Also tell them if you smoke, drink alcohol, or use illegal drugs. Some items may interact with your medicine. What should I watch for while using this medication? Visit your care team for regular checks on your progress. Your symptoms may appear to get worse during the first weeks of this therapy. Tell your care team if your symptoms do not start to get better or if they get worse after this time. Using this medication for a long time may weaken your bones. If you smoke or frequently drink alcohol you may increase your risk of bone loss. A family history of osteoporosis, chronic use of medications for seizures (convulsions), or corticosteroids can also increase your risk of bone loss. The risk of bone fractures may be increased. Talk to your care team about your bone health. This medication may increase blood sugar. The risk may be higher in patients who already have diabetes. Ask your care team what you can do to lower your risk of diabetes while taking this medication. This medication should stop regular monthly menstruation in women. Tell your care team if you continue to menstruate. Talk to your care team if you wish to become pregnant or think you might be pregnant. This medication can cause serious birth defects if taken during pregnancy or for 12 weeks after stopping treatment. Talk to your care team about reliable forms of contraception. Do not breastfeed while taking this   medication. This medication may cause infertility. Talk to your care team if you are concerned about your fertility. What side effects may I notice from receiving this medication? Side effects that you should report to your care team as soon as possible: Allergic reactions--skin rash, itching, hives, swelling  of the face, lips, tongue, or throat Change in the amount of urine Heart attack--pain or tightness in the chest, shoulders, arms, or jaw, nausea, shortness of breath, cold or clammy skin, feeling faint or lightheaded Heart rhythm changes--fast or irregular heartbeat, dizziness, feeling faint or lightheaded, chest pain, trouble breathing High blood sugar (hyperglycemia)--increased thirst or amount of urine, unusual weakness or fatigue, blurry vision High calcium level--increased thirst or amount of urine, nausea, vomiting, confusion, unusual weakness or fatigue, bone pain Pain, redness, irritation, or bruising at the injection site Severe back pain, numbness or weakness of the hands, arms, legs, or feet, loss of coordination, loss of bowel or bladder control Stroke--sudden numbness or weakness of the face, arm, or leg, trouble speaking, confusion, trouble walking, loss of balance or coordination, dizziness, severe headache, change in vision Swelling and pain of the tumor site or lymph nodes Trouble passing urine Side effects that usually do not require medical attention (report to your care team if they continue or are bothersome): Change in sex drive or performance Headache Hot flashes Rapid or extreme change in emotion or mood Sweating Swelling of the ankles, hands, or feet Unusual vaginal discharge, itching, or odor This list may not describe all possible side effects. Call your doctor for medical advice about side effects. You may report side effects to FDA at 1-800-FDA-1088. Where should I keep my medication? This medication is given in a hospital or clinic. It will not be stored at home. NOTE: This sheet is a summary. It may not cover all possible information. If you have questions about this medicine, talk to your doctor, pharmacist, or health care provider.  2024 Elsevier/Gold Standard (2021-06-02 00:00:00)  

## 2022-09-28 ENCOUNTER — Other Ambulatory Visit: Payer: Self-pay

## 2022-09-28 ENCOUNTER — Inpatient Hospital Stay: Payer: BC Managed Care – PPO | Attending: Hematology and Oncology

## 2022-09-28 VITALS — BP 109/64 | HR 73 | Temp 98.2°F | Resp 18

## 2022-09-28 DIAGNOSIS — Z5111 Encounter for antineoplastic chemotherapy: Secondary | ICD-10-CM | POA: Diagnosis present

## 2022-09-28 DIAGNOSIS — Z95828 Presence of other vascular implants and grafts: Secondary | ICD-10-CM

## 2022-09-28 DIAGNOSIS — C50411 Malignant neoplasm of upper-outer quadrant of right female breast: Secondary | ICD-10-CM | POA: Diagnosis present

## 2022-09-28 MED ORDER — GOSERELIN ACETATE 3.6 MG ~~LOC~~ IMPL
3.6000 mg | DRUG_IMPLANT | Freq: Once | SUBCUTANEOUS | Status: AC
  Administered 2022-09-28: 3.6 mg via SUBCUTANEOUS

## 2022-10-16 ENCOUNTER — Ambulatory Visit: Payer: BC Managed Care – PPO | Attending: General Surgery

## 2022-10-16 VITALS — Wt 226.4 lb

## 2022-10-16 DIAGNOSIS — Z483 Aftercare following surgery for neoplasm: Secondary | ICD-10-CM | POA: Insufficient documentation

## 2022-10-16 NOTE — Therapy (Signed)
OUTPATIENT PHYSICAL THERAPY SOZO SCREENING NOTE   Patient Name: Evelyn Marshall MRN: [id number] DOB:01-06-1986, 37 y.o., female Today's Date: 10/16/2022  PCP: Caffie Damme, MD REFERRING PROVIDER: Emelia Loron, MD   PT End of Session - 10/16/22 1646     Visit Number 2   # unchanged due to screen only   PT Start Time 1645    PT Stop Time 1649    PT Time Calculation (min) 4 min    Activity Tolerance Patient tolerated treatment well    Behavior During Therapy [employer] for tasks assessed/performed             Past Medical History:  Diagnosis Date   Abnormal Pap smear 2012   colpo result benign   Cancer (HCC)    Chlamydia    Family history of breast cancer 12/15/2020   History of pre-eclampsia    HPV (human papilloma virus) infection    Hyperemesis    Hypokalemia    Pregnancy induced hypertension    Ptyalism    Vaginal Pap smear, abnormal    Past Surgical History:  Procedure Laterality Date   BREAST REDUCTION SURGERY Left 07/05/2021   Procedure: LEFT MAMMARY REDUCTION  (BREAST);  Surgeon: Glenna Fellows, MD;  Location: [facility];  Service: Plastics;  Laterality: Left;   COLPOSCOPY     IR IMAGING GUIDED PORT INSERTION  12/24/2020   IR REMOVAL TUN ACCESS W/ PORT W/O FL MOD SED  01/20/2022   NODE DISSECTION Right 07/05/2021   Procedure: RIGHT AXILLARY NODE DISSECTION;  Surgeon: Emelia Loron, MD;  Location: [facility];  Service: General;  Laterality: Right;   SIMPLE MASTECTOMY WITH AXILLARY SENTINEL NODE BIOPSY Right 07/05/2021   Procedure: RIGHT MASTECTOMY;  Surgeon: Emelia Loron, MD;  Location: [facility];  Service: General;  Laterality: Right;  GEN & PEC BLOCK   WISDOM TOOTH EXTRACTION     Patient Active Problem List   Diagnosis Date Noted   S/P mastectomy, right 07/05/2021   Postpartum care following vaginal delivery 03/16/2021   Malignant neoplasm affecting pregnancy in third trimester 03/15/2021   Malignant  neoplasm affecting pregnancy, antepartum, third trimester 03/13/2021   Port-A-Cath in place 12/31/2020   Genetic testing 12/27/2020   Malignant neoplasm of upper-outer quadrant of right breast in female, estrogen receptor positive (HCC) 12/15/2020   Family history of breast cancer 12/15/2020   Severe preeclampsia, third trimester 02/25/2019   Hyperemesis gravidarum 08/20/2018   Preeclampsia, third trimester 11/15/2015   Pre-eclampsia during pregnancy in third trimester, antepartum 11/15/2015   Preeclampsia 11/15/2015   Mild preeclampsia 11/12/2015   Hypokalemia, inadequate intake 06/17/2015   Ptyalism 05/18/2015   Chronic hypertension in pregnancy 05/18/2015   Morbid obesity with BMI of 40.0-44.9, adult (HCC) 05/18/2015   Rh negative, maternal 05/18/2015    REFERRING DIAG: right breast cancer at risk for lymphedema  THERAPY DIAG: Aftercare following surgery for neoplasm  PERTINENT HISTORY: She is now post op Right mastectomy on 07/05/2021 with a left Breast reduction. 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 This was a grade 3. The largest remaining tumor was 8 mm and it was multifocal. There were 24 lymph nodes all removed  with an axillary lymph node dissection. 5 of these had macrometastases present in them. This was a less than 1% ER positive, PR negative, HER2 positive tumor. She is presently undergoing radiation and will have 31 treatments  PRECAUTIONS: right UE Lymphedema risk,  None  SUBJECTIVE: Pt returns for her 3 month L-Dex screen.   PAIN:  Are you having pain? No  SOZO SCREENING: Patient was assessed today using the SOZO machine to determine the lymphedema index score. This was compared to her baseline score. It was determined that she is within the recommended range when compared to her baseline and no further action is needed at this time. She will continue SOZO screenings. These  are done every 3 months for 2 years post operatively followed by every 6 months for 2 years, and then annually.  Also answered pts questions regarding when she should wear her compression sleeve and for how long.    L-DEX FLOWSHEETS - 10/16/22 1600       L-DEX LYMPHEDEMA SCREENING   Measurement Type Unilateral    L-DEX MEASUREMENT EXTREMITY Upper Extremity    POSITION  Standing    DOMINANT SIDE Right    At Risk Side Right    BASELINE SCORE (UNILATERAL) 4.2    L-DEX SCORE (UNILATERAL) -0.1    VALUE CHANGE (UNILAT) -4.3              Hermenia Bers, PTA 10/16/2022, 4:47 PM

## 2022-10-26 ENCOUNTER — Inpatient Hospital Stay: Payer: BC Managed Care – PPO | Attending: Hematology and Oncology

## 2022-10-26 ENCOUNTER — Other Ambulatory Visit: Payer: Self-pay

## 2022-10-26 VITALS — BP 129/83 | HR 81 | Temp 98.1°F | Resp 16

## 2022-10-26 DIAGNOSIS — C50411 Malignant neoplasm of upper-outer quadrant of right female breast: Secondary | ICD-10-CM | POA: Insufficient documentation

## 2022-10-26 DIAGNOSIS — Z5111 Encounter for antineoplastic chemotherapy: Secondary | ICD-10-CM | POA: Insufficient documentation

## 2022-10-26 DIAGNOSIS — Z170 Estrogen receptor positive status [ER+]: Secondary | ICD-10-CM

## 2022-10-26 DIAGNOSIS — Z95828 Presence of other vascular implants and grafts: Secondary | ICD-10-CM

## 2022-10-26 MED ORDER — GOSERELIN ACETATE 3.6 MG ~~LOC~~ IMPL
3.6000 mg | DRUG_IMPLANT | Freq: Once | SUBCUTANEOUS | Status: AC
  Administered 2022-10-26: 3.6 mg via SUBCUTANEOUS
  Filled 2022-10-26: qty 3.6

## 2022-10-27 ENCOUNTER — Telehealth: Payer: Self-pay

## 2022-10-27 NOTE — Telephone Encounter (Signed)
Called pt per MD to advise [employer] testing was negative/not detected. Pt verbalized understanding of results and knows [employer] will be in touch to schedule 3 mo repeat lab.   

## 2022-10-30 ENCOUNTER — Encounter: Payer: Self-pay | Admitting: Hematology and Oncology

## 2022-11-16 ENCOUNTER — Telehealth: Payer: Self-pay

## 2022-11-16 NOTE — Telephone Encounter (Signed)
Informed patient that her [employer] Critical Illness Claim document were ready for pick up at the [facility] campus as requested. Patient had no further concerns at this time.

## 2022-11-17 ENCOUNTER — Telehealth: Payer: Self-pay | Admitting: Hematology and Oncology

## 2022-11-17 NOTE — Telephone Encounter (Signed)
Patient is aware of rescheduled appointment times/dates on 12/22/2022

## 2022-11-23 ENCOUNTER — Inpatient Hospital Stay: Payer: BC Managed Care – PPO

## 2022-11-23 ENCOUNTER — Other Ambulatory Visit: Payer: Self-pay

## 2022-11-23 VITALS — BP 108/82 | HR 74 | Temp 98.2°F | Resp 16

## 2022-11-23 DIAGNOSIS — Z95828 Presence of other vascular implants and grafts: Secondary | ICD-10-CM

## 2022-11-23 DIAGNOSIS — C50411 Malignant neoplasm of upper-outer quadrant of right female breast: Secondary | ICD-10-CM

## 2022-11-23 DIAGNOSIS — Z5111 Encounter for antineoplastic chemotherapy: Secondary | ICD-10-CM | POA: Diagnosis not present

## 2022-11-23 MED ORDER — GOSERELIN ACETATE 3.6 MG ~~LOC~~ IMPL
3.6000 mg | DRUG_IMPLANT | Freq: Once | SUBCUTANEOUS | Status: AC
  Administered 2022-11-23: 3.6 mg via SUBCUTANEOUS
  Filled 2022-11-23: qty 3.6

## 2022-11-23 NOTE — Patient Instructions (Signed)
Goserelin Implant What is this medication? GOSERELIN (GOE se rel in) treats prostate cancer and breast cancer. It works by decreasing levels of the hormones testosterone and estrogen in the body. This prevents prostate and breast cancer cells from spreading or growing. It may also be used to treat endometriosis. This is a condition where the tissue that lines the uterus grows outside the uterus. It works by decreasing the amount of estrogen your body makes, which reduces heavy bleeding and pain. It can also be used to help thin the lining of the uterus before a surgery used to prevent or reduce heavy periods. This medicine may be used for other purposes; ask your health care provider or pharmacist if you have questions. COMMON BRAND NAME(S): Zoladex, Zoladex 3-Month What should I tell my care team before I take this medication? They need to know if you have any of these conditions: Bone problems Diabetes Heart disease History of irregular heartbeat or rhythm An unusual or allergic reaction to goserelin, other medications, foods, dyes, or preservatives Pregnant or trying to get pregnant Breastfeeding How should I use this medication? This medication is injected under the skin. It is given by your care team in a hospital or clinic setting. Talk to your care team about the use of this medication in children. Special care may be needed. Overdosage: If you think you have taken too much of this medicine contact a poison control center or emergency room at once. NOTE: This medicine is only for you. Do not share this medicine with others. What if I miss a dose? Keep appointments for follow-up doses. It is important not to miss your dose. Call your care team if you are unable to keep an appointment. What may interact with this medication? Do not take this medication with any of the following: Cisapride Dronedarone Pimozide Thioridazine This medication may also interact with the following: Other  medications that cause heart rhythm changes This list may not describe all possible interactions. Give your health care provider a list of all the medicines, herbs, non-prescription drugs, or dietary supplements you use. Also tell them if you smoke, drink alcohol, or use illegal drugs. Some items may interact with your medicine. What should I watch for while using this medication? Visit your care team for regular checks on your progress. Your symptoms may appear to get worse during the first weeks of this therapy. Tell your care team if your symptoms do not start to get better or if they get worse after this time. Using this medication for a long time may weaken your bones. If you smoke or frequently drink alcohol you may increase your risk of bone loss. A family history of osteoporosis, chronic use of medications for seizures (convulsions), or corticosteroids can also increase your risk of bone loss. The risk of bone fractures may be increased. Talk to your care team about your bone health. This medication may increase blood sugar. The risk may be higher in patients who already have diabetes. Ask your care team what you can do to lower your risk of diabetes while taking this medication. This medication should stop regular monthly menstruation in women. Tell your care team if you continue to menstruate. Talk to your care team if you wish to become pregnant or think you might be pregnant. This medication can cause serious birth defects if taken during pregnancy or for 12 weeks after stopping treatment. Talk to your care team about reliable forms of contraception. Do not breastfeed while taking this   medication. This medication may cause infertility. Talk to your care team if you are concerned about your fertility. What side effects may I notice from receiving this medication? Side effects that you should report to your care team as soon as possible: Allergic reactions--skin rash, itching, hives, swelling  of the face, lips, tongue, or throat Change in the amount of urine Heart attack--pain or tightness in the chest, shoulders, arms, or jaw, nausea, shortness of breath, cold or clammy skin, feeling faint or lightheaded Heart rhythm changes--fast or irregular heartbeat, dizziness, feeling faint or lightheaded, chest pain, trouble breathing High blood sugar (hyperglycemia)--increased thirst or amount of urine, unusual weakness or fatigue, blurry vision High calcium level--increased thirst or amount of urine, nausea, vomiting, confusion, unusual weakness or fatigue, bone pain Pain, redness, irritation, or bruising at the injection site Severe back pain, numbness or weakness of the hands, arms, legs, or feet, loss of coordination, loss of bowel or bladder control Stroke--sudden numbness or weakness of the face, arm, or leg, trouble speaking, confusion, trouble walking, loss of balance or coordination, dizziness, severe headache, change in vision Swelling and pain of the tumor site or lymph nodes Trouble passing urine Side effects that usually do not require medical attention (report to your care team if they continue or are bothersome): Change in sex drive or performance Headache Hot flashes Rapid or extreme change in emotion or mood Sweating Swelling of the ankles, hands, or feet Unusual vaginal discharge, itching, or odor This list may not describe all possible side effects. Call your doctor for medical advice about side effects. You may report side effects to FDA at 1-800-FDA-1088. Where should I keep my medication? This medication is given in a hospital or clinic. It will not be stored at home. NOTE: This sheet is a summary. It may not cover all possible information. If you have questions about this medicine, talk to your doctor, pharmacist, or health care provider.  2024 Elsevier/Gold Standard (2021-06-02 00:00:00)  

## 2022-11-27 ENCOUNTER — Other Ambulatory Visit: Payer: Self-pay | Admitting: Hematology and Oncology

## 2022-11-27 DIAGNOSIS — I10 Essential (primary) hypertension: Secondary | ICD-10-CM

## 2022-11-27 MED ORDER — LOSARTAN POTASSIUM 25 MG PO TABS: 25.0000 mg | ORAL_TABLET | Freq: Every day | ORAL | Status: AC

## 2022-12-22 ENCOUNTER — Inpatient Hospital Stay: Payer: BC Managed Care – PPO

## 2022-12-25 ENCOUNTER — Inpatient Hospital Stay: Payer: BC Managed Care – PPO | Attending: Hematology and Oncology

## 2022-12-25 DIAGNOSIS — Z5111 Encounter for antineoplastic chemotherapy: Secondary | ICD-10-CM | POA: Insufficient documentation

## 2022-12-25 DIAGNOSIS — C50411 Malignant neoplasm of upper-outer quadrant of right female breast: Secondary | ICD-10-CM | POA: Insufficient documentation

## 2023-01-09 ENCOUNTER — Encounter: Payer: Self-pay | Admitting: Hematology and Oncology

## 2023-01-15 ENCOUNTER — Ambulatory Visit: Payer: BC Managed Care – PPO | Attending: General Surgery

## 2023-01-15 VITALS — Wt 239.5 lb

## 2023-01-15 DIAGNOSIS — Z483 Aftercare following surgery for neoplasm: Secondary | ICD-10-CM | POA: Insufficient documentation

## 2023-01-15 NOTE — Therapy (Signed)
OUTPATIENT PHYSICAL THERAPY SOZO SCREENING NOTE   Patient Name: Evelyn Marshall MRN: [id number] DOB:Aug 23, 1985, 37 y.o., female Today's Date: 01/15/2023  PCP: Caffie Damme, MD REFERRING PROVIDER: Emelia Loron, MD   PT End of Session - 01/15/23 [phone]     Visit Number 2   # unchanged due to screen only   PT Start Time 0941    PT Stop Time 0945    PT Time Calculation (min) 4 min    Activity Tolerance Patient tolerated treatment well    Behavior During Therapy [facility] for tasks assessed/performed             Past Medical History:  Diagnosis Date   Abnormal Pap smear 2012   colpo result benign   Cancer (HCC)    Chlamydia    Family history of breast cancer 12/15/2020   History of pre-eclampsia    HPV (human papilloma virus) infection    Hyperemesis    Hypokalemia    Pregnancy induced hypertension    Ptyalism    Vaginal Pap smear, abnormal    Past Surgical History:  Procedure Laterality Date   BREAST REDUCTION SURGERY Left 07/05/2021   Procedure: LEFT MAMMARY REDUCTION  (BREAST);  Surgeon: Glenna Fellows, MD;  Location: [facility];  Service: Plastics;  Laterality: Left;   COLPOSCOPY     IR IMAGING GUIDED PORT INSERTION  12/24/2020   IR REMOVAL TUN ACCESS W/ PORT W/O FL MOD SED  01/20/2022   NODE DISSECTION Right 07/05/2021   Procedure: RIGHT AXILLARY NODE DISSECTION;  Surgeon: Emelia Loron, MD;  Location: [facility];  Service: General;  Laterality: Right;   SIMPLE MASTECTOMY WITH AXILLARY SENTINEL NODE BIOPSY Right 07/05/2021   Procedure: RIGHT MASTECTOMY;  Surgeon: Emelia Loron, MD;  Location: [facility];  Service: General;  Laterality: Right;  GEN & PEC BLOCK   WISDOM TOOTH EXTRACTION     Patient Active Problem List   Diagnosis Date Noted   S/P mastectomy, right 07/05/2021   Postpartum care following vaginal delivery 03/16/2021   Malignant neoplasm affecting pregnancy in third trimester 03/15/2021   Malignant  neoplasm affecting pregnancy, antepartum, third trimester 03/13/2021   Port-A-Cath in place 12/31/2020   Genetic testing 12/27/2020   Malignant neoplasm of upper-outer quadrant of right breast in female, estrogen receptor positive (HCC) 12/15/2020   Family history of breast cancer 12/15/2020   Severe preeclampsia, third trimester 02/25/2019   Hyperemesis gravidarum 08/20/2018   Preeclampsia, third trimester 11/15/2015   Pre-eclampsia during pregnancy in third trimester, antepartum 11/15/2015   Preeclampsia 11/15/2015   Mild preeclampsia 11/12/2015   Hypokalemia, inadequate intake 06/17/2015   Ptyalism 05/18/2015   Chronic hypertension in pregnancy 05/18/2015   Morbid obesity with BMI of 40.0-44.9, adult (HCC) 05/18/2015   Rh negative, maternal 05/18/2015    REFERRING DIAG: right breast cancer at risk for lymphedema  THERAPY DIAG: Aftercare following surgery for neoplasm  PERTINENT HISTORY: She is now post op Right mastectomy on 07/05/2021 with a left Breast reduction. 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 This was a grade 3. The largest remaining tumor was 8 mm and it was multifocal. There were 24 lymph nodes all removed  with an axillary lymph node dissection. 5 of these had macrometastases present in them. This was a less than 1% ER positive, PR negative, HER2 positive tumor. She is presently undergoing radiation and will have 31 treatments  PRECAUTIONS: right UE Lymphedema risk,  None  SUBJECTIVE: Pt returns for her 3 month L-Dex screen.   PAIN:  Are you having pain? No  SOZO SCREENING: Patient was assessed today using the SOZO machine to determine the lymphedema index score. This was compared to her baseline score. It was determined that she is within the recommended range when compared to her baseline and no further action is needed at this time. She will continue SOZO screenings. These  are done every 3 months for 2 years post operatively followed by every 6 months for 2 years, and then annually.  Also answered pts questions regarding when she should wear her compression sleeve and for how long.    L-DEX FLOWSHEETS - 01/15/23 0900       L-DEX LYMPHEDEMA SCREENING   Measurement Type Unilateral    L-DEX MEASUREMENT EXTREMITY Upper Extremity    POSITION  Standing    DOMINANT SIDE Right    At Risk Side Right    BASELINE SCORE (UNILATERAL) 4.2    L-DEX SCORE (UNILATERAL) 3.5    VALUE CHANGE (UNILAT) -0.7              Hermenia Bers, PTA 01/15/2023, 9:44 AM

## 2023-01-17 ENCOUNTER — Other Ambulatory Visit: Payer: Self-pay

## 2023-01-18 ENCOUNTER — Inpatient Hospital Stay: Payer: BC Managed Care – PPO

## 2023-01-18 ENCOUNTER — Telehealth: Payer: Self-pay | Admitting: Hematology and Oncology

## 2023-01-18 ENCOUNTER — Other Ambulatory Visit: Payer: Self-pay

## 2023-01-18 VITALS — BP 108/73 | HR 78 | Temp 97.9°F | Resp 16

## 2023-01-18 DIAGNOSIS — Z5111 Encounter for antineoplastic chemotherapy: Secondary | ICD-10-CM | POA: Diagnosis present

## 2023-01-18 DIAGNOSIS — Z95828 Presence of other vascular implants and grafts: Secondary | ICD-10-CM

## 2023-01-18 DIAGNOSIS — C50411 Malignant neoplasm of upper-outer quadrant of right female breast: Secondary | ICD-10-CM

## 2023-01-18 MED ORDER — GOSERELIN ACETATE 3.6 MG ~~LOC~~ IMPL
3.6000 mg | DRUG_IMPLANT | Freq: Once | SUBCUTANEOUS | Status: AC
  Administered 2023-01-18: 3.6 mg via SUBCUTANEOUS
  Filled 2023-01-18: qty 3.6

## 2023-01-19 ENCOUNTER — Inpatient Hospital Stay: Payer: BC Managed Care – PPO

## 2023-01-22 ENCOUNTER — Encounter: Payer: Self-pay | Admitting: Hematology and Oncology

## 2023-02-16 ENCOUNTER — Encounter: Payer: Self-pay | Admitting: Adult Health

## 2023-02-16 ENCOUNTER — Inpatient Hospital Stay: Payer: 59

## 2023-02-16 ENCOUNTER — Encounter: Payer: Self-pay | Admitting: Hematology and Oncology

## 2023-02-16 ENCOUNTER — Inpatient Hospital Stay: Payer: 59 | Attending: Hematology and Oncology | Admitting: Adult Health

## 2023-02-16 VITALS — BP 117/56 | HR 75 | Temp 98.2°F | Resp 18 | Ht 64.0 in | Wt 253.3 lb

## 2023-02-16 DIAGNOSIS — Z5111 Encounter for antineoplastic chemotherapy: Secondary | ICD-10-CM | POA: Diagnosis present

## 2023-02-16 DIAGNOSIS — Z170 Estrogen receptor positive status [ER+]: Secondary | ICD-10-CM | POA: Diagnosis not present

## 2023-02-16 DIAGNOSIS — C50411 Malignant neoplasm of upper-outer quadrant of right female breast: Secondary | ICD-10-CM | POA: Diagnosis present

## 2023-02-16 MED ORDER — GOSERELIN ACETATE 3.6 MG ~~LOC~~ IMPL
3.6000 mg | DRUG_IMPLANT | Freq: Once | SUBCUTANEOUS | Status: AC
  Administered 2023-02-16: 3.6 mg via SUBCUTANEOUS
  Filled 2023-02-16: qty 3.6

## 2023-02-16 MED ORDER — ANASTROZOLE 1 MG PO TABS: 1.0000 mg | ORAL_TABLET | Freq: Every day | ORAL | 3 refills | Status: DC

## 2023-02-20 NOTE — Progress Notes (Unsigned)
[facility] Cancer Follow up:    Evelyn Damme, MD [address] [place] [address]   DIAGNOSIS: Cancer Staging  Malignant neoplasm of upper-outer quadrant of right breast in female, estrogen receptor positive (HCC) Staging form: Breast, AJCC 8th Edition - Clinical: Stage IIIA (cT3, cN2, cM0, G2, ER+, PR-, HER2+) - Signed by Loa Socks, NP on 04/13/2022 Histologic grading system: 3 grade system - Pathologic: No stage assigned - Unsigned   SUMMARY OF ONCOLOGIC HISTORY: Oncology History  Malignant neoplasm of upper-outer quadrant of right breast in female, estrogen receptor positive (HCC)  12/08/2020 Initial Diagnosis   21-week pregnancy: [facility] at [facility] noncontrast revealed right axillary mass 3.7 cm, subpectoral lymph nodes, left supraclavicular lymph node, multiple level 2 and 3 lymph nodes in the right axilla, 2 breast masses 1.1 cm largest size.  Mammogram in [place] revealed 3 small masses at 12:00 1.2 cm, 0.6 cm, 0.9 cm and 5 abnormal axillary lymph nodes, pleomorphic calcifications measuring 10 cm, biopsy revealed grade 2 IDC with necrosis and calcifications ER 30%, PR 0%, HER2 positive, Ki-67 30 to 40%   12/15/2020 Cancer Staging   Staging form: Breast, AJCC 8th Edition - Clinical: Stage IIIA (cT3, cN2, cM0, G2, ER+, PR-, HER2+) - Signed by Loa Socks, NP on 04/13/2022 Histologic grading system: 3 grade system   12/24/2020 - 02/23/2021 Chemotherapy   Patient is on Treatment Plan : BREAST Adjuvant AC q21d     12/29/2020 Genetic Testing   Negative hereditary cancer genetic testing: no pathogenic variants detected in [facility] BRCAPlus Panel or [employer] CustomNext-Cancer +RNAinsight Panel.  The report dates are 12/23/2020 and 12/29/2020.   The BRCAplus panel offered by [employer] and includes sequencing and deletion/duplication analysis for the following 8 genes: ATM, BRCA1, BRCA2, CDH1, CHEK2, PALB2, PTEN, and TP53.  The  CustomNext-Cancer+RNAinsight panel offered by [employer] includes sequencing and rearrangement analysis for the following 47 genes:  APC, ATM, AXIN2, BARD1, BMPR1A, BRCA1, BRCA2, BRIP1, CDH1, CDK4, CDKN2A, CHEK2, DICER1, EPCAM, GREM1, HOXB13, MEN1, MLH1, MSH2, MSH3, MSH6, MUTYH, NBN, NF1, NF2, NTHL1, PALB2, PMS2, POLD1, POLE, PTEN, RAD51C, RAD51D, RECQL, RET, SDHA, SDHAF2, SDHB, SDHC, SDHD, SMAD4, SMARCA4, STK11, TP53, TSC1, TSC2, and VHL.  RNA data is routinely analyzed for use in variant interpretation for all genes.   03/25/2021 - 06/30/2021 Chemotherapy   Patient is on Treatment Plan : BREAST AC q21 days / Trastuzumab + Pertuzumab + Weekly Paclitaxel q21d     07/05/2021 Surgery   8 mm residual HG IDC with LVI, Margins Neg    07/29/2021 - 01/13/2022 Chemotherapy   Patient is on Treatment Plan : BREAST ADO-Trastuzumab Emtansine (Kadcyla) q21d     08/22/2021 - 09/27/2021 Radiation Therapy   Right Chest Wall: 50 Gy in 25 treatments   10/2021 -  Anti-estrogen oral therapy   Anastrozole     CURRENT THERAPY:  INTERVAL HISTORY: Evelyn Marshall 38 y.o. female returns for    Patient Active Problem List   Diagnosis Date Noted   S/P mastectomy, right 07/05/2021   Genetic testing 12/27/2020   Malignant neoplasm of upper-outer quadrant of right breast in female, estrogen receptor positive (HCC) 12/15/2020   Family history of breast cancer 12/15/2020   Hypokalemia, inadequate intake 06/17/2015   Morbid obesity with BMI of 40.0-44.9, adult (HCC) 05/18/2015    has no known allergies.  MEDICAL HISTORY: Past Medical History:  Diagnosis Date   Abnormal Pap smear 2012   colpo result benign   Cancer (HCC)  Chlamydia    Chronic hypertension in pregnancy 05/18/2015   Family history of breast cancer 12/15/2020   History of pre-eclampsia    HPV (human papilloma virus) infection    Hyperemesis    Hyperemesis gravidarum 08/20/2018   Hypokalemia    Malignant neoplasm affecting pregnancy in third  trimester 03/15/2021   Malignant neoplasm affecting pregnancy, antepartum, third trimester 03/13/2021   Mild preeclampsia 11/12/2015   Port-A-Cath in place 12/31/2020   Postpartum care following vaginal delivery 03/16/2021   Pre-eclampsia during pregnancy in third trimester, antepartum 11/15/2015   Preeclampsia 11/15/2015   Preeclampsia, third trimester 11/15/2015   Pregnancy induced hypertension    Ptyalism    Rh negative, maternal 05/18/2015   Severe preeclampsia, third trimester 02/25/2019   Vaginal Pap smear, abnormal     SURGICAL HISTORY: Past Surgical History:  Procedure Laterality Date   BREAST REDUCTION SURGERY Left 07/05/2021   Procedure: LEFT MAMMARY REDUCTION  (BREAST);  Surgeon: Glenna Fellows, MD;  Location: [facility];  Service: Plastics;  Laterality: Left;   COLPOSCOPY     IR IMAGING GUIDED PORT INSERTION  12/24/2020   IR REMOVAL TUN ACCESS W/ PORT W/O FL MOD SED  01/20/2022   NODE DISSECTION Right 07/05/2021   Procedure: RIGHT AXILLARY NODE DISSECTION;  Surgeon: Emelia Loron, MD;  Location: [facility];  Service: General;  Laterality: Right;   SIMPLE MASTECTOMY WITH AXILLARY SENTINEL NODE BIOPSY Right 07/05/2021   Procedure: RIGHT MASTECTOMY;  Surgeon: Emelia Loron, MD;  Location: [facility];  Service: General;  Laterality: Right;  GEN & PEC BLOCK   WISDOM TOOTH EXTRACTION      SOCIAL HISTORY: Social History   Socioeconomic History   Marital status: Married    Spouse name: Earl Lites   Number of children: 3   Years of education: 16   Highest education level: Bachelor's degree (e.g., BA, AB, BS)  Occupational History   Occupation: Runner, broadcasting/film/video  Tobacco Use   Smoking status: Never   Smokeless tobacco: Never  Vaping Use   Vaping status: Never Used  Substance and Sexual Activity   Alcohol use: No   Drug use: No   Sexual activity: Yes  Other Topics Concern   Not on file  Social History Narrative   Not on  file   Social Drivers of Health   Financial Resource Strain: Not on file  Food Insecurity: Not on file  Transportation Needs: Not on file  Physical Activity: Not on file  Stress: Not on file  Social Connections: Not on file  Intimate Partner Violence: Not on file    FAMILY HISTORY: Family History  Problem Relation Age of Onset   Heart disease Father    Hypertension Father    Stroke Father    Pancreatic cancer Maternal Aunt 73   Prostate cancer Paternal Uncle    Prostate cancer Paternal Uncle    Breast cancer Paternal Grandmother        dx 37s   Stroke Paternal Grandmother    Breast cancer Cousin 62       maternal female cousin    Review of Systems  Constitutional:  Negative for appetite change, chills, fatigue, fever and unexpected weight change.  HENT:   Negative for hearing loss, lump/mass and trouble swallowing.   Eyes:  Negative for eye problems and icterus.  Respiratory:  Negative for chest tightness, cough and shortness of breath.   Cardiovascular:  Negative for chest pain, leg swelling and palpitations.  Gastrointestinal:  Negative  for abdominal distention, abdominal pain, constipation, diarrhea, nausea and vomiting.  Endocrine: Negative for hot flashes.  Genitourinary:  Negative for difficulty urinating.   Musculoskeletal:  Negative for arthralgias.  Skin:  Negative for itching and rash.  Neurological:  Negative for dizziness, extremity weakness, headaches and numbness.  Hematological:  Negative for adenopathy. Does not bruise/bleed easily.  Psychiatric/Behavioral:  Negative for depression. The patient is not nervous/anxious.       PHYSICAL EXAMINATION    Vitals:   02/16/23 1154  BP: (!) 117/56  Pulse: 75  Resp: 18  Temp: 98.2 F (36.8 C)  SpO2: 99%    Physical Exam Constitutional:      General: She is not in acute distress.    Appearance: Normal appearance. She is not toxic-appearing.  HENT:     Head: Normocephalic and atraumatic.      Mouth/Throat:     Mouth: Mucous membranes are moist.     Pharynx: Oropharynx is clear. No oropharyngeal exudate or posterior oropharyngeal erythema.  Eyes:     General: No scleral icterus. Cardiovascular:     Rate and Rhythm: Normal rate and regular rhythm.     Pulses: Normal pulses.     Heart sounds: Normal heart sounds.  Pulmonary:     Effort: Pulmonary effort is normal.     Breath sounds: Normal breath sounds.  Abdominal:     General: Abdomen is flat. Bowel sounds are normal. There is no distension.     Palpations: Abdomen is soft.     Tenderness: There is no abdominal tenderness.  Musculoskeletal:        General: No swelling.     Cervical back: Neck supple.  Lymphadenopathy:     Cervical: No cervical adenopathy.  Skin:    General: Skin is warm and dry.     Findings: No rash.  Neurological:     General: No focal deficit present.     Mental Status: She is alert.  Psychiatric:        Mood and Affect: Mood normal.        Behavior: Behavior normal.     LABORATORY DATA:  CBC    Component Value Date/Time   WBC 6.0 04/13/2022 1349   WBC 3.5 (L) 09/09/2021 1015   RBC 4.28 04/13/2022 1349   HGB 10.9 (L) 04/13/2022 1349   HCT 32.9 (L) 04/13/2022 1349   PLT 282 04/13/2022 1349   MCV 76.9 (L) 04/13/2022 1349   MCH 25.5 (L) 04/13/2022 1349   MCHC 33.1 04/13/2022 1349   RDW 17.2 (H) 04/13/2022 1349   LYMPHSABS 1.8 04/13/2022 1349   MONOABS 0.5 04/13/2022 1349   EOSABS 0.1 04/13/2022 1349   BASOSABS 0.0 04/13/2022 1349    CMP     Component Value Date/Time   NA 136 04/13/2022 1349   K 4.1 04/13/2022 1349   CL 97 (L) 04/13/2022 1349   CO2 31 04/13/2022 1349   GLUCOSE 88 04/13/2022 1349   BUN 13 04/13/2022 1349   CREATININE 0.78 04/13/2022 1349   CALCIUM 9.9 04/13/2022 1349   PROT 7.8 04/13/2022 1349   ALBUMIN 3.9 04/13/2022 1349   AST 24 04/13/2022 1349   ALT 17 04/13/2022 1349   ALKPHOS 108 04/13/2022 1349   BILITOT 0.4 04/13/2022 1349   GFRNONAA >60  04/13/2022 1349   GFRAA >60 02/28/2019 1610       PENDING LABS:   RADIOGRAPHIC STUDIES:  No results found.   PATHOLOGY:     ASSESSMENT and THERAPY PLAN:  No problem-specific Assessment & Plan notes found for this encounter.   No orders of the defined types were placed in this encounter.   All questions were answered. The patient knows to call the clinic with any problems, questions or concerns. We can certainly see the patient much sooner if necessary. This note was electronically signed. Noreene Filbert, NP 02/20/2023

## 2023-02-21 ENCOUNTER — Encounter: Payer: Self-pay | Admitting: Hematology and Oncology

## 2023-02-21 NOTE — Assessment & Plan Note (Signed)
12/08/2020:21-week pregnancy: MRI at [facility] noncontrast revealed right axillary mass 3.7 cm, subpectoral lymph nodes, left supraclavicular lymph node, multiple level 2 and 3 lymph nodes in the right axilla, 2 breast masses 1.1 cm largest size.  Mammogram in [place] revealed 3 small masses at 12:00 1.2 cm, 0.6 cm, 0.9 cm and 5 abnormal axillary lymph nodes, pleomorphic calcifications measuring 10 cm, biopsy revealed grade 2 IDC with necrosis and calcifications ER 30%, PR 0%, HER2 positive, Ki-67 30 to 40%   PET/CT 03/24/2021: Multifocal right breast cancer, hypermetabolic right axillary, subpectoral, bilateral supraclavicular and mediastinal lymph nodes 08/09/2021: PET CT scan: Right mastectomy and right axillary lymph node dissection: Postoperative changes and seroma.  No convincing evidence of distant metastatic disease.    Treatment plan: 1.  Neoadjuvant chemotherapy with Adriamycin and Cytoxan every 3 weeks x4 started 12/24/2020 completed 02/23/2021 2. delivery of the baby 3.  Continue neoadjuvant therapy with Taxol Herceptin and Perjeta (Taxol completed 06/09/21) 4.  Mastectomy with ALND:  07/05/21: 8 mm residual HG IDC with LVI, Margins Neg 4.  Kadcyla completed 01/12/2022 5.  Adjuvant radiation therapy 08/23/2021 - 10/03/2021 6.  Adjuvant antiestrogen therapy: Zoladex with anastrozole started 10/28/2021 Genetic testing: Negative ---------------------------------------------------------------------------------------------------------------------------------- Current treatment: Zoladex/Anastrozole  Breast Cancer Patient has completed Kadcyla treatment and port has been removed. She is currently on Anastrozole and Zoladex. She is considering DIEP flap surgery and needs to reduce BMI to under 30 for the procedure. -Continue Anastrozole and Zoladex as prescribed. -Refill Anastrozole prescription and send to [facility]. -Schedule more Zoladex injections. -Encourage healthy diet and exercise to reduce  BMI.  Hypertension Patient is on blood pressure medication and reports good control. -Continue current blood pressure medication.  General Health Maintenance -Next mammogram due in December 2025. -Follow-up appointment with doctor in 24-28 weeks. -Provide patient with diet jumpstart guide and encourage use of [employer] app for healthier food choices.

## 2023-02-23 ENCOUNTER — Telehealth: Payer: Self-pay

## 2023-02-23 NOTE — Telephone Encounter (Signed)
Notified Patient of completion of FMLA forms. Forms placed for pick-up as requested by Patient. No other needs or concerns noted at this time.

## 2023-03-15 ENCOUNTER — Encounter: Payer: Self-pay | Admitting: Hematology and Oncology

## 2023-03-16 ENCOUNTER — Inpatient Hospital Stay: Payer: 59 | Attending: Hematology and Oncology

## 2023-03-16 VITALS — BP 119/65 | HR 87 | Temp 97.8°F | Resp 16

## 2023-03-16 DIAGNOSIS — Z170 Estrogen receptor positive status [ER+]: Secondary | ICD-10-CM

## 2023-03-16 DIAGNOSIS — C50411 Malignant neoplasm of upper-outer quadrant of right female breast: Secondary | ICD-10-CM | POA: Diagnosis present

## 2023-03-16 DIAGNOSIS — Z5111 Encounter for antineoplastic chemotherapy: Secondary | ICD-10-CM | POA: Diagnosis present

## 2023-03-16 MED ORDER — GOSERELIN ACETATE 3.6 MG ~~LOC~~ IMPL
3.6000 mg | DRUG_IMPLANT | Freq: Once | SUBCUTANEOUS | Status: AC
  Administered 2023-03-16: 3.6 mg via SUBCUTANEOUS
  Filled 2023-03-16: qty 3.6

## 2023-03-27 ENCOUNTER — Other Ambulatory Visit: Payer: Self-pay | Admitting: *Deleted

## 2023-03-27 DIAGNOSIS — Z170 Estrogen receptor positive status [ER+]: Secondary | ICD-10-CM

## 2023-03-27 NOTE — Progress Notes (Signed)
 [employer] renewal orders placed per MD request.

## 2023-04-04 ENCOUNTER — Ambulatory Visit (HOSPITAL_COMMUNITY): Payer: 59 | Attending: Internal Medicine

## 2023-04-04 DIAGNOSIS — I5030 Unspecified diastolic (congestive) heart failure: Secondary | ICD-10-CM | POA: Diagnosis not present

## 2023-04-04 DIAGNOSIS — Z9221 Personal history of antineoplastic chemotherapy: Secondary | ICD-10-CM | POA: Diagnosis present

## 2023-04-04 LAB — ECHOCARDIOGRAM COMPLETE
Area-P 1/2: 4.15 cm2
Est EF: 55
S' Lateral: 2.8 cm

## 2023-04-05 ENCOUNTER — Encounter: Payer: Self-pay | Admitting: *Deleted

## 2023-04-16 ENCOUNTER — Inpatient Hospital Stay: Payer: 59 | Attending: Hematology and Oncology

## 2023-04-16 ENCOUNTER — Ambulatory Visit: Payer: Medicaid Other | Attending: General Surgery

## 2023-04-16 VITALS — BP 102/60 | HR 75 | Temp 98.8°F | Resp 18

## 2023-04-16 VITALS — Wt 254.2 lb

## 2023-04-16 DIAGNOSIS — C50411 Malignant neoplasm of upper-outer quadrant of right female breast: Secondary | ICD-10-CM | POA: Insufficient documentation

## 2023-04-16 DIAGNOSIS — Z1722 Progesterone receptor negative status: Secondary | ICD-10-CM | POA: Diagnosis not present

## 2023-04-16 DIAGNOSIS — Z5111 Encounter for antineoplastic chemotherapy: Secondary | ICD-10-CM | POA: Diagnosis present

## 2023-04-16 DIAGNOSIS — Z170 Estrogen receptor positive status [ER+]: Secondary | ICD-10-CM | POA: Insufficient documentation

## 2023-04-16 DIAGNOSIS — Z9221 Personal history of antineoplastic chemotherapy: Secondary | ICD-10-CM | POA: Insufficient documentation

## 2023-04-16 DIAGNOSIS — Z923 Personal history of irradiation: Secondary | ICD-10-CM | POA: Diagnosis not present

## 2023-04-16 DIAGNOSIS — Z1731 Human epidermal growth factor receptor 2 positive status: Secondary | ICD-10-CM | POA: Insufficient documentation

## 2023-04-16 DIAGNOSIS — Z79811 Long term (current) use of aromatase inhibitors: Secondary | ICD-10-CM | POA: Insufficient documentation

## 2023-04-16 DIAGNOSIS — Z483 Aftercare following surgery for neoplasm: Secondary | ICD-10-CM | POA: Insufficient documentation

## 2023-04-16 MED ORDER — GOSERELIN ACETATE 3.6 MG ~~LOC~~ IMPL
3.6000 mg | DRUG_IMPLANT | Freq: Once | SUBCUTANEOUS | Status: AC
  Administered 2023-04-16: 3.6 mg via SUBCUTANEOUS
  Filled 2023-04-16: qty 3.6

## 2023-04-16 NOTE — Therapy (Signed)
 OUTPATIENT PHYSICAL THERAPY SOZO SCREENING NOTE   Patient Name: Evelyn Marshall MRN: [id number] DOB:[birth year], 38 y.o., female Today's Date: 04/16/2023  PCP: Caffie Damme, MD REFERRING PROVIDER: Emelia Loron, MD   PT End of Session - 04/16/23 [phone]     Visit Number 2   # unchanged due to screen only   PT Start Time 0941    PT Stop Time 0945    PT Time Calculation (min) 4 min    Activity Tolerance Patient tolerated treatment well    Behavior During Therapy [facility] for tasks assessed/performed             Past Medical History:  Diagnosis Date   Abnormal Pap smear 2012   colpo result benign   Cancer (HCC)    Chlamydia    Chronic hypertension in pregnancy 05/18/2015   Family history of breast cancer 12/15/2020   History of pre-eclampsia    HPV (human papilloma virus) infection    Hyperemesis    Hyperemesis gravidarum 08/20/2018   Hypokalemia    Malignant neoplasm affecting pregnancy in third trimester 03/15/2021   Malignant neoplasm affecting pregnancy, antepartum, third trimester 03/13/2021   Mild preeclampsia 11/12/2015   Port-A-Cath in place 12/31/2020   Postpartum care following vaginal delivery 03/16/2021   Pre-eclampsia during pregnancy in third trimester, antepartum 11/15/2015   Preeclampsia 11/15/2015   Preeclampsia, third trimester 11/15/2015   Pregnancy induced hypertension    Ptyalism    Rh negative, maternal 05/18/2015   Severe preeclampsia, third trimester 02/25/2019   Vaginal Pap smear, abnormal    Past Surgical History:  Procedure Laterality Date   BREAST REDUCTION SURGERY Left 07/05/2021   Procedure: LEFT MAMMARY REDUCTION  (BREAST);  Surgeon: Glenna Fellows, MD;  Location: [facility];  Service: Plastics;  Laterality: Left;   COLPOSCOPY     IR IMAGING GUIDED PORT INSERTION  12/24/2020   IR REMOVAL TUN ACCESS W/ PORT W/O FL MOD SED  01/20/2022   NODE DISSECTION Right 07/05/2021   Procedure: RIGHT AXILLARY NODE DISSECTION;  Surgeon:  Emelia Loron, MD;  Location: [facility];  Service: General;  Laterality: Right;   SIMPLE MASTECTOMY WITH AXILLARY SENTINEL NODE BIOPSY Right 07/05/2021   Procedure: RIGHT MASTECTOMY;  Surgeon: Emelia Loron, MD;  Location: [facility];  Service: General;  Laterality: Right;  GEN & PEC BLOCK   WISDOM TOOTH EXTRACTION     Patient Active Problem List   Diagnosis Date Noted   S/P mastectomy, right 07/05/2021   Genetic testing 12/27/2020   Malignant neoplasm of upper-outer quadrant of right breast in female, estrogen receptor positive (HCC) 12/15/2020   Family history of breast cancer 12/15/2020   Hypokalemia, inadequate intake 06/17/2015   Morbid obesity with BMI of 40.0-44.9, adult (HCC) 05/18/2015    REFERRING DIAG: right breast cancer at risk for lymphedema  THERAPY DIAG: Aftercare following surgery for neoplasm  PERTINENT HISTORY: She is now post op Right mastectomy on 07/05/2021 with a left Breast reduction. 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 This was a grade 3. The largest remaining tumor was 8 mm and it was multifocal. There were 24 lymph nodes all removed  with an axillary lymph node dissection. 5 of these had macrometastases present in them. This was a less than 1% ER positive, PR negative, HER2 positive tumor. She is presently undergoing radiation and will have 31 treatments  PRECAUTIONS: right UE Lymphedema risk, None  SUBJECTIVE: Pt  returns for her 3 month L-Dex screen.   PAIN:  Are you having pain? No  SOZO SCREENING: Patient was assessed today using the SOZO machine to determine the lymphedema index score. This was compared to her baseline score. It was determined that she is within the recommended range when compared to her baseline and no further action is needed at this time. She will continue SOZO screenings. These are done every 3 months  for 2 years post operatively followed by every 6 months for 2 years, and then annually.  Also answered pts questions regarding when she should wear her compression sleeve and for how long.    L-DEX FLOWSHEETS - 04/16/23 0900       L-DEX LYMPHEDEMA SCREENING   Measurement Type Unilateral    L-DEX MEASUREMENT EXTREMITY Upper Extremity    POSITION  Standing    DOMINANT SIDE Right    At Risk Side Right    BASELINE SCORE (UNILATERAL) 4.2    L-DEX SCORE (UNILATERAL) 3.4    VALUE CHANGE (UNILAT) -0.8              Hermenia Bers, PTA 04/16/2023, 9:44 AM

## 2023-04-20 LAB — SIGNATERA ONLY (NATERA MANAGED)
SIGNATERA MTM READOUT: 0 MTM/ml
SIGNATERA TEST RESULT: NEGATIVE

## 2023-05-14 ENCOUNTER — Inpatient Hospital Stay: Payer: 59 | Attending: Hematology and Oncology

## 2023-05-14 DIAGNOSIS — Z5111 Encounter for antineoplastic chemotherapy: Secondary | ICD-10-CM | POA: Insufficient documentation

## 2023-05-14 DIAGNOSIS — C50411 Malignant neoplasm of upper-outer quadrant of right female breast: Secondary | ICD-10-CM | POA: Insufficient documentation

## 2023-05-15 ENCOUNTER — Inpatient Hospital Stay

## 2023-05-15 ENCOUNTER — Telehealth: Payer: Self-pay | Admitting: Hematology and Oncology

## 2023-05-15 VITALS — BP 123/78 | HR 78 | Temp 98.8°F | Resp 18

## 2023-05-15 DIAGNOSIS — Z5111 Encounter for antineoplastic chemotherapy: Secondary | ICD-10-CM | POA: Diagnosis present

## 2023-05-15 DIAGNOSIS — C50411 Malignant neoplasm of upper-outer quadrant of right female breast: Secondary | ICD-10-CM | POA: Diagnosis present

## 2023-05-15 DIAGNOSIS — Z170 Estrogen receptor positive status [ER+]: Secondary | ICD-10-CM

## 2023-05-15 MED ORDER — GOSERELIN ACETATE 3.6 MG ~~LOC~~ IMPL
3.6000 mg | DRUG_IMPLANT | Freq: Once | SUBCUTANEOUS | Status: AC
  Administered 2023-05-15: 3.6 mg via SUBCUTANEOUS
  Filled 2023-05-15: qty 3.6

## 2023-05-15 NOTE — Telephone Encounter (Signed)
 Rescheduled appointment per the patients request. The patient is aware of the changes made.

## 2023-06-11 ENCOUNTER — Inpatient Hospital Stay: Payer: 59 | Attending: Hematology and Oncology

## 2023-06-11 VITALS — BP 121/79 | HR 91 | Temp 98.2°F | Resp 18

## 2023-06-11 DIAGNOSIS — Z5111 Encounter for antineoplastic chemotherapy: Secondary | ICD-10-CM | POA: Diagnosis present

## 2023-06-11 DIAGNOSIS — Z170 Estrogen receptor positive status [ER+]: Secondary | ICD-10-CM

## 2023-06-11 DIAGNOSIS — C50411 Malignant neoplasm of upper-outer quadrant of right female breast: Secondary | ICD-10-CM | POA: Insufficient documentation

## 2023-06-11 MED ORDER — GOSERELIN ACETATE 3.6 MG ~~LOC~~ IMPL
3.6000 mg | DRUG_IMPLANT | Freq: Once | SUBCUTANEOUS | Status: AC
  Administered 2023-06-11: 3.6 mg via SUBCUTANEOUS
  Filled 2023-06-11: qty 3.6

## 2023-06-14 ENCOUNTER — Encounter: Payer: Self-pay | Admitting: Internal Medicine

## 2023-07-09 ENCOUNTER — Inpatient Hospital Stay: Payer: 59 | Attending: Hematology and Oncology

## 2023-07-09 VITALS — BP 125/84 | HR 90 | Temp 98.2°F | Resp 18

## 2023-07-09 DIAGNOSIS — C50411 Malignant neoplasm of upper-outer quadrant of right female breast: Secondary | ICD-10-CM | POA: Insufficient documentation

## 2023-07-09 DIAGNOSIS — Z5111 Encounter for antineoplastic chemotherapy: Secondary | ICD-10-CM | POA: Insufficient documentation

## 2023-07-09 DIAGNOSIS — Z170 Estrogen receptor positive status [ER+]: Secondary | ICD-10-CM

## 2023-07-09 MED ORDER — GOSERELIN ACETATE 3.6 MG ~~LOC~~ IMPL
3.6000 mg | DRUG_IMPLANT | Freq: Once | SUBCUTANEOUS | Status: AC
  Administered 2023-07-09: 3.6 mg via SUBCUTANEOUS
  Filled 2023-07-09: qty 3.6

## 2023-07-11 ENCOUNTER — Other Ambulatory Visit: Payer: Self-pay

## 2023-07-11 ENCOUNTER — Encounter: Payer: Self-pay | Admitting: Hematology and Oncology

## 2023-07-11 ENCOUNTER — Other Ambulatory Visit (HOSPITAL_COMMUNITY): Payer: Self-pay

## 2023-07-11 MED ORDER — CARVEDILOL 25 MG PO TABS
25.0000 mg | ORAL_TABLET | Freq: Two times a day (BID) | ORAL | 0 refills | Status: DC
  Filled 2023-07-11: qty 30, 15d supply, fill #0

## 2023-07-13 ENCOUNTER — Other Ambulatory Visit (HOSPITAL_COMMUNITY): Payer: Self-pay

## 2023-07-13 ENCOUNTER — Other Ambulatory Visit: Payer: Self-pay | Admitting: *Deleted

## 2023-07-13 MED ORDER — CARVEDILOL 25 MG PO TABS: 25.0000 mg | ORAL_TABLET | Freq: Two times a day (BID) | ORAL | 0 refills | Status: AC

## 2023-07-16 ENCOUNTER — Ambulatory Visit: Attending: General Surgery

## 2023-07-16 DIAGNOSIS — Z483 Aftercare following surgery for neoplasm: Secondary | ICD-10-CM | POA: Insufficient documentation

## 2023-07-24 ENCOUNTER — Ambulatory Visit: Attending: Cardiology | Admitting: Physician Assistant

## 2023-07-24 NOTE — Progress Notes (Deleted)
 OFFICE NOTE:    Date:  07/24/2023  ID:  Trina LOISE Ka, DOB [birth year], MRN [id number] PCP: Claudene Round, MD   [facility] Providers Cardiologist:  Alvan Ronal BRAVO, MD (Inactive) { Click to update primary MD,subspecialty MD or APP then REFRESH:1}       Hypertension  Hx of pre-eclampsia Stage IV R breast CA (ER+, HER2+) Tx w AC Q21 days/Trastuzumab  + Pertuzumab + Weekly Paclitaxel   TTE 06/23/21: EF 60, GLS -16 TTE 04/04/23: EF 55, no RWMA, Gr 1 DD, NL RVSF, NL PASP, GLS -20.3 FHx of CHF (father - has LVAD)       Discussed the use of AI scribe software for clinical note transcription with the patient, who gave verbal consent to proceed. History of Present Illness Evelyn Marshall is a 38 y.o. female  She was previously followed by Dr. Ronal Alvan. She underwent serial echocardiography for cardiotoxicity surveillance while on chemotherapy for breast CA. She was last seen by Dr. Alvan in 06/2022. Follow up TTE in 03/2023 demonstrated normal EF, normal strain. She is to follow up with Dr. Zenaida going forward.   Oncology History  Malignant neoplasm of upper-outer quadrant of right breast in female, estrogen receptor positive (HCC)  12/08/2020 Initial Diagnosis   21-week pregnancy: [facility] at [facility] noncontrast revealed right axillary mass 3.7 cm, subpectoral lymph nodes, left supraclavicular lymph node, multiple level 2 and 3 lymph nodes in the right axilla, 2 breast masses 1.1 cm largest size.  Mammogram in [place] revealed 3 small masses at 12:00 1.2 cm, 0.6 cm, 0.9 cm and 5 abnormal axillary lymph nodes, pleomorphic calcifications measuring 10 cm, biopsy revealed grade 2 IDC with necrosis and calcifications ER 30%, PR 0%, HER2 positive, Ki-67 30 to 40%   12/15/2020 Cancer Staging   Staging form: Breast, AJCC 8th Edition - Clinical: Stage IIIA (cT3, cN2, cM0, G2, ER+, PR-, HER2+) - Signed by Crawford Morna Pickle, NP on 04/13/2022 Histologic grading system: 3 grade system   12/24/2020 -  02/23/2021 Chemotherapy   Patient is on Treatment Plan : BREAST Adjuvant AC q21d     12/29/2020 Genetic Testing   Negative hereditary cancer genetic testing: no pathogenic variants detected in [employer] BRCAPlus Panel or [employer] CustomNext-Cancer +RNAinsight Panel.  The report dates are 12/23/2020 and 12/29/2020.   The BRCAplus panel offered by [employer] and includes sequencing and deletion/duplication analysis for the following 8 genes: ATM, BRCA1, BRCA2, CDH1, CHEK2, PALB2, PTEN, and TP53.  The CustomNext-Cancer+RNAinsight panel offered by [employer] includes sequencing and rearrangement analysis for the following 47 genes:  APC, ATM, AXIN2, BARD1, BMPR1A, BRCA1, BRCA2, BRIP1, CDH1, CDK4, CDKN2A, CHEK2, DICER1, EPCAM, GREM1, HOXB13, MEN1, MLH1, MSH2, MSH3, MSH6, MUTYH, NBN, NF1, NF2, NTHL1, PALB2, PMS2, POLD1, POLE, PTEN, RAD51C, RAD51D, RECQL, RET, SDHA, SDHAF2, SDHB, SDHC, SDHD, SMAD4, SMARCA4, STK11, TP53, TSC1, TSC2, and VHL.  RNA data is routinely analyzed for use in variant interpretation for all genes.   03/25/2021 - 06/30/2021 Chemotherapy   Patient is on Treatment Plan : BREAST AC q21 days / Trastuzumab  + Pertuzumab  + Weekly Paclitaxel  q21d     07/05/2021 Surgery   8 mm residual HG IDC with LVI, Margins Neg    07/29/2021 - 01/13/2022 Chemotherapy   Patient is on Treatment Plan : BREAST ADO-Trastuzumab Emtansine  (Kadcyla ) q21d     08/22/2021 - 09/27/2021 Radiation Therapy   Right Chest Wall: 50 Gy in 25 treatments   10/2021 -  Anti-estrogen oral therapy   Anastrozole   ROS-See HPI***    Studies Reviewed:      *** Results  Risk Assessment/Calculations: {Does this patient have ATRIAL FIBRILLATION?:[id number]} No BP recorded.  {Refresh Note OR Click here to enter BP  :1}***      Physical Exam:  VS:  There were no vitals taken for this visit.       Wt Readings from Last 3 Encounters:  04/16/23 254 lb 4 oz (115.3 kg)  02/16/23 253 lb 4.8 oz (114.9 kg)  01/15/23 239 lb 8 oz (108.6  kg)    Physical Exam***     Assessment and Plan:    Assessment & Plan Essential hypertension  Status post administration of cardiotoxic chemotherapy  Assessment and Plan Assessment & Plan    {      :1}    {Are you ordering a CV Procedure (e.g. stress test, cath, DCCV, TEE, etc)?   Press F2        :[id number]}  Dispo:  No follow-ups on file.  Signed, Glendia Ferrier, PA-C

## 2023-08-06 ENCOUNTER — Inpatient Hospital Stay: Payer: 59

## 2023-08-06 ENCOUNTER — Inpatient Hospital Stay: Payer: 59 | Attending: Hematology and Oncology | Admitting: Hematology and Oncology

## 2023-08-06 VITALS — BP 116/53 | HR 71 | Temp 97.7°F | Resp 18 | Ht 64.0 in | Wt 281.4 lb

## 2023-08-06 DIAGNOSIS — Z170 Estrogen receptor positive status [ER+]: Secondary | ICD-10-CM | POA: Insufficient documentation

## 2023-08-06 DIAGNOSIS — C50411 Malignant neoplasm of upper-outer quadrant of right female breast: Secondary | ICD-10-CM

## 2023-08-06 DIAGNOSIS — Z1731 Human epidermal growth factor receptor 2 positive status: Secondary | ICD-10-CM | POA: Diagnosis not present

## 2023-08-06 DIAGNOSIS — Z5111 Encounter for antineoplastic chemotherapy: Secondary | ICD-10-CM | POA: Insufficient documentation

## 2023-08-06 DIAGNOSIS — Z1722 Progesterone receptor negative status: Secondary | ICD-10-CM | POA: Diagnosis not present

## 2023-08-06 DIAGNOSIS — C778 Secondary and unspecified malignant neoplasm of lymph nodes of multiple regions: Secondary | ICD-10-CM | POA: Diagnosis present

## 2023-08-06 MED ORDER — GOSERELIN ACETATE 3.6 MG ~~LOC~~ IMPL
3.6000 mg | DRUG_IMPLANT | Freq: Once | SUBCUTANEOUS | Status: AC
  Administered 2023-08-06: 3.6 mg via SUBCUTANEOUS
  Filled 2023-08-06: qty 3.6

## 2023-08-06 NOTE — Progress Notes (Signed)
 Patient Care Team: Claudene Round, MD as PCP - General (Family Medicine) Alvan Ronal BRAVO, MD (Inactive) as PCP - Cardiology (Cardiology) Odean Potts, MD as Consulting Physician (Hematology and Oncology) Rutherford Gain, MD as Consulting Physician (Obstetrics and Gynecology) Arelia Filippo, MD as Consulting Physician (Plastic Surgery) Ebbie Cough, MD as Consulting Physician (General Surgery)  DIAGNOSIS:  Encounter Diagnosis  Name Primary?   Malignant neoplasm of upper-outer quadrant of right breast in female, estrogen receptor positive (HCC) Yes    SUMMARY OF ONCOLOGIC HISTORY: Oncology History  Malignant neoplasm of upper-outer quadrant of right breast in female, estrogen receptor positive (HCC)  12/08/2020 Initial Diagnosis   21-week pregnancy: [facility] at [facility] noncontrast revealed right axillary mass 3.7 cm, subpectoral lymph nodes, left supraclavicular lymph node, multiple level 2 and 3 lymph nodes in the right axilla, 2 breast masses 1.1 cm largest size.  Mammogram in [place] revealed 3 small masses at 12:00 1.2 cm, 0.6 cm, 0.9 cm and 5 abnormal axillary lymph nodes, pleomorphic calcifications measuring 10 cm, biopsy revealed grade 2 IDC with necrosis and calcifications ER 30%, PR 0%, HER2 positive, Ki-67 30 to 40%   12/15/2020 Cancer Staging   Staging form: Breast, AJCC 8th Edition - Clinical: Stage IIIA (cT3, cN2, cM0, G2, ER+, PR-, HER2+) - Signed by Crawford Morna Pickle, NP on 04/13/2022 Histologic grading system: 3 grade system   12/24/2020 - 02/23/2021 Chemotherapy   Patient is on Treatment Plan : BREAST Adjuvant AC q21d     12/29/2020 Genetic Testing   Negative hereditary cancer genetic testing: no pathogenic variants detected in [employer] BRCAPlus Panel or [employer] CustomNext-Cancer +RNAinsight Panel.  The report dates are 12/23/2020 and 12/29/2020.   The BRCAplus panel offered by [employer] and includes sequencing and deletion/duplication analysis for the following  8 genes: ATM, BRCA1, BRCA2, CDH1, CHEK2, PALB2, PTEN, and TP53.  The CustomNext-Cancer+RNAinsight panel offered by [employer] includes sequencing and rearrangement analysis for the following 47 genes:  APC, ATM, AXIN2, BARD1, BMPR1A, BRCA1, BRCA2, BRIP1, CDH1, CDK4, CDKN2A, CHEK2, DICER1, EPCAM, GREM1, HOXB13, MEN1, MLH1, MSH2, MSH3, MSH6, MUTYH, NBN, NF1, NF2, NTHL1, PALB2, PMS2, POLD1, POLE, PTEN, RAD51C, RAD51D, RECQL, RET, SDHA, SDHAF2, SDHB, SDHC, SDHD, SMAD4, SMARCA4, STK11, TP53, TSC1, TSC2, and VHL.  RNA data is routinely analyzed for use in variant interpretation for all genes.   03/25/2021 - 06/30/2021 Chemotherapy   Patient is on Treatment Plan : BREAST AC q21 days / Trastuzumab  + Pertuzumab  + Weekly Paclitaxel  q21d     07/05/2021 Surgery   8 mm residual HG IDC with LVI, Margins Neg    07/29/2021 - 01/13/2022 Chemotherapy   Patient is on Treatment Plan : BREAST ADO-Trastuzumab Emtansine  (Kadcyla ) q21d     08/22/2021 - 09/27/2021 Radiation Therapy   Right Chest Wall: 50 Gy in 25 treatments   10/2021 -  Anti-estrogen oral therapy   Anastrozole      CHIEF COMPLIANT: Follow-up on anastrozole  therapy along with Zoladex   HISTORY OF PRESENT ILLNESS:  History of Present Illness Evelyn Marshall is a 38 year old female with breast cancer who presents for follow-up regarding her ongoing treatment and weight management.  She is on Anastrozole  and Goserelin for estrogen receptor-positive breast cancer. Hot flashes occur as a side effect of Anastrozole  but have decreased recently. She tolerates the medication well otherwise. Her recent mammogram in December showed less dense breast tissue, and her Signatera testing has been negative. Her port has been removed without issues.  She faces challenges with weight gain despite  efforts to maintain a routine. She has been prescribed Zepbound for weight management but has not yet started it. Her weight continues to increase, which is a concern for her.      ALLERGIES:  has no known allergies.  MEDICATIONS:  Current Outpatient Medications  Medication Sig Dispense Refill   anastrozole  (ARIMIDEX ) 1 MG tablet Take 1 tablet (1 mg total) by mouth daily. 90 tablet 3   carvedilol  (COREG ) 25 MG tablet Take 1 tablet (25 mg total) by mouth 2 (two) times daily with a meal. 60 tablet 0   carvedilol  (COREG ) 6.25 MG tablet Take 6.25 mg by mouth 2 (two) times daily. (Patient taking differently: Take 6 mg by mouth 2 (two) times daily.)     Iron -FA-B Cmp-C-Biot-Probiotic (FUSION PLUS) CAPS Take 1 capsule by mouth daily.     levothyroxine (SYNTHROID) 75 MCG tablet TAKE 1 TABLET BY MOUTH DAILY FIRST THING IN THE MORNING WITH WATER. DO NOT EAT / DRINK ANYTHING ELSE OR TAKE OTHER MEDICINES FOR AT LEAST 30 MINUTES     losartan  (COZAAR ) 25 MG tablet Take 1 tablet (25 mg total) by mouth daily. Pt taking 1 tablet daily (Patient taking differently: Take 50 mg by mouth daily. Pt taking 1 tablet daily)     No current facility-administered medications for this visit.    PHYSICAL EXAMINATION: ECOG PERFORMANCE STATUS: 1 - Symptomatic but completely ambulatory  Vitals:   08/06/23 1509  BP: (!) 116/53  Pulse: 71  Resp: 18  Temp: 97.7 F (36.5 C)  SpO2: 100%   Filed Weights   08/06/23 1509  Weight: 281 lb 6.4 oz (127.6 kg)      LABORATORY DATA:  I have reviewed the data as listed    Latest Ref Rng & Units 04/13/2022    1:49 PM 01/13/2022    9:22 AM 12/23/2021    9:49 AM  CMP  Glucose 70 - 99 mg/dL 88  897  95   BUN 6 - 20 mg/dL 13  16  11    Creatinine 0.44 - 1.00 mg/dL 9.21  9.31  9.37   Sodium 135 - 145 mmol/L 136  137  137   Potassium 3.5 - 5.1 mmol/L 4.1  3.8  4.2   Chloride 98 - 111 mmol/L 97  102  104   CO2 22 - 32 mmol/L 31  29  27    Calcium  8.9 - 10.3 mg/dL 9.9  9.9  9.9   Total Protein 6.5 - 8.1 g/dL 7.8  8.1  8.5   Total Bilirubin 0.3 - 1.2 mg/dL 0.4  0.6  0.5   Alkaline Phos 38 - 126 U/L 108  134  139   AST 15 - 41 U/L 24  73  48   ALT 0 -  44 U/L 17  36  29     Lab Results  Component Value Date   WBC 6.0 04/13/2022   HGB 10.9 (L) 04/13/2022   HCT 32.9 (L) 04/13/2022   MCV 76.9 (L) 04/13/2022   PLT 282 04/13/2022   NEUTROABS 3.6 04/13/2022    ASSESSMENT & PLAN:  Malignant neoplasm of upper-outer quadrant of right breast in female, estrogen receptor positive (HCC) 12/08/2020:21-week pregnancy: [facility] non[facility] revealed right axillary mass 3.7 cm, subpectoral lymph nodes, left supraclavicular lymph node, multiple level 2 and 3 lymph nodes in the right axilla, 2 breast masses 1.1 cm largest size.  Mammogram in [place] revealed 3 small masses at 12:00 1.2 cm, 0.6 cm, 0.9 cm and  5 abnormal axillary lymph nodes, pleomorphic calcifications measuring 10 cm, biopsy revealed grade 2 IDC with necrosis and calcifications ER 30%, PR 0%, HER2 positive, Ki-67 30 to 40%   PET/CT 03/24/2021: Multifocal right breast cancer, hypermetabolic right axillary, subpectoral, bilateral supraclavicular and mediastinal lymph nodes   Treatment plan: 1.  Neoadjuvant chemotherapy with Adriamycin  and Cytoxan  every 3 weeks x4 started 12/24/2020 completed 02/23/2021 2. delivery of the baby 3.  Continue neoadjuvant therapy with Taxol  Herceptin  and Perjeta  (Taxol  completed 06/09/21) 4.  Mastectomy with ALND:  07/05/21: 8 mm residual HG IDC with LVI, Margins Neg 4.  Kadcyla  started 07/29/2021-01/12/2022 5.  Adjuvant radiation therapy 08/23/2021 - 10/03/2021 6.  Adjuvant antiestrogen therapy: Zoladex  with anastrozole  started 10/28/2021 Genetic testing: Negative ---------------------------------------------------------------------------------------------------------------------------------- Current treatment: Zoladex  with anastrozole  started 10/28/2021: Tolerating extremely well without any problems or concerns   Anemia: Microcytic: With normal iron  studies.  Takes 1 tablet oral iron    08/09/2021: PET CT scan: Right mastectomy and right axillary lymph node  dissection: Postoperative changes and seroma.  No convincing evidence of distant metastatic disease.   Breast cancer surveillance: Mamm[facility] 12/24/2021 [facility]: Benign breast density category B Breast exam 08/06/2023: Benign Signatera minimal residual disease monitoring 04/20/2023: Negative   Weight/obesity: Patient is starting Zepbound Return to clinic monthly for injections and in 6 months for follow-up with me.   Assessment & Plan Malignant neoplasm of upper-outer quadrant of right breast Estrogen receptor-positive breast cancer in the upper-outer quadrant of the right breast. Currently on Anastrozole  and Goserelin. Recent mammograms clear, low breast density reduces risk of missed diagnoses and future breast cancer. Negative Signatera testing indicates no detectable circulating tumor DNA. - Continue Anastrozole  1 mg orally daily. - Continue Goserelin 3.6 mg subcutaneously once a month. - Schedule annual follow-up appointments. - Continue regular mammograms and Signatera testing as per protocol.  Essential hypertension Hypertension managed with Carvedilol  and Losartan . - Continue Carvedilol  25 mg orally twice daily. - Continue Losartan  25 mg orally daily.  Anemia: Microcytic No specific discussion about microcytic anemia in this encounter.  Weight management Considering weight management strategies, including Zepbound, a newer medication with fewer side effects. Aware of challenges in maintaining weight loss post-medication. Interested in sustainable lifestyle changes. - Start Zepbound as prescribed by the Engineer, petroleum. - Implement intermittent fasting and consider drinking hot water to manage hunger.      No orders of the defined types were placed in this encounter.  The patient has a good understanding of the overall plan. she agrees with it. she will call with any problems that may develop before the next visit here. Total time spent: 30 mins including face to face time  and time spent for planning, charting and co-ordination of care   Viinay K Satrina Magallanes, MD 08/06/23

## 2023-08-06 NOTE — Assessment & Plan Note (Signed)
 12/08/2020:21-week pregnancy: MRI at [facility] noncontrast revealed right axillary mass 3.7 cm, subpectoral lymph nodes, left supraclavicular lymph node, multiple level 2 and 3 lymph nodes in the right axilla, 2 breast masses 1.1 cm largest size.  Mammogram in [place] revealed 3 small masses at 12:00 1.2 cm, 0.6 cm, 0.9 cm and 5 abnormal axillary lymph nodes, pleomorphic calcifications measuring 10 cm, biopsy revealed grade 2 IDC with necrosis and calcifications ER 30%, PR 0%, HER2 positive, Ki-67 30 to 40%   PET/CT 03/24/2021: Multifocal right breast cancer, hypermetabolic right axillary, subpectoral, bilateral supraclavicular and mediastinal lymph nodes   Treatment plan: 1.  Neoadjuvant chemotherapy with Adriamycin  and Cytoxan  every 3 weeks x4 started 12/24/2020 completed 02/23/2021 2. delivery of the baby 3.  Continue neoadjuvant therapy with Taxol  Herceptin  and Perjeta  (Taxol  completed 06/09/21) 4.  Mastectomy with ALND:  07/05/21: 8 mm residual HG IDC with LVI, Margins Neg 4.  Kadcyla  started 07/29/2021-01/12/2022 5.  Adjuvant radiation therapy 08/23/2021 - 10/03/2021 6.  Adjuvant antiestrogen therapy: Zoladex  with anastrozole  started 10/28/2021 Genetic testing: Negative ---------------------------------------------------------------------------------------------------------------------------------- Current treatment: Zoladex  with anastrozole  started 10/28/2021: Tolerating extremely well without any problems or concerns   Anemia: Microcytic: With normal iron  studies.  Takes 1 tablet oral iron    08/09/2021: PET CT scan: Right mastectomy and right axillary lymph node dissection: Postoperative changes and seroma.  No convincing evidence of distant metastatic disease.   Breast cancer surveillance: Mammogram 12/24/2021 at [facility]: Benign breast density category B Breast exam 08/06/2023: Benign Signatera minimal residual disease monitoring 04/20/2023: Negative   Return to clinic monthly for injections and in 6  months for follow-up with me.

## 2023-09-03 ENCOUNTER — Inpatient Hospital Stay: Payer: 59 | Attending: Hematology and Oncology

## 2023-09-03 VITALS — BP 113/74 | HR 73 | Resp 17

## 2023-09-03 DIAGNOSIS — C778 Secondary and unspecified malignant neoplasm of lymph nodes of multiple regions: Secondary | ICD-10-CM | POA: Insufficient documentation

## 2023-09-03 DIAGNOSIS — C50411 Malignant neoplasm of upper-outer quadrant of right female breast: Secondary | ICD-10-CM | POA: Insufficient documentation

## 2023-09-03 DIAGNOSIS — Z170 Estrogen receptor positive status [ER+]: Secondary | ICD-10-CM

## 2023-09-03 DIAGNOSIS — Z5111 Encounter for antineoplastic chemotherapy: Secondary | ICD-10-CM | POA: Insufficient documentation

## 2023-09-03 MED ORDER — GOSERELIN ACETATE 3.6 MG ~~LOC~~ IMPL
3.6000 mg | DRUG_IMPLANT | Freq: Once | SUBCUTANEOUS | Status: AC
Start: 2023-09-03 — End: 2023-09-03
  Administered 2023-09-03 (×2): 3.6 mg via SUBCUTANEOUS
  Filled 2023-09-03: qty 3.6

## 2023-09-24 ENCOUNTER — Encounter: Payer: Self-pay | Admitting: Hematology and Oncology

## 2023-10-01 ENCOUNTER — Inpatient Hospital Stay: Payer: 59

## 2023-10-03 ENCOUNTER — Inpatient Hospital Stay: Attending: Hematology and Oncology

## 2023-10-04 ENCOUNTER — Inpatient Hospital Stay: Attending: Hematology and Oncology

## 2023-10-04 VITALS — BP 119/83 | HR 80 | Temp 99.0°F | Resp 17

## 2023-10-04 DIAGNOSIS — C778 Secondary and unspecified malignant neoplasm of lymph nodes of multiple regions: Secondary | ICD-10-CM | POA: Diagnosis present

## 2023-10-04 DIAGNOSIS — Z170 Estrogen receptor positive status [ER+]: Secondary | ICD-10-CM

## 2023-10-04 DIAGNOSIS — Z5111 Encounter for antineoplastic chemotherapy: Secondary | ICD-10-CM | POA: Diagnosis present

## 2023-10-04 DIAGNOSIS — C50411 Malignant neoplasm of upper-outer quadrant of right female breast: Secondary | ICD-10-CM | POA: Insufficient documentation

## 2023-10-04 MED ORDER — GOSERELIN ACETATE 3.6 MG ~~LOC~~ IMPL
3.6000 mg | DRUG_IMPLANT | Freq: Once | SUBCUTANEOUS | Status: AC
  Administered 2023-10-04: 3.6 mg via SUBCUTANEOUS
  Filled 2023-10-04: qty 3.6

## 2023-10-19 LAB — SIGNATERA
SIGNATERA MTM READOUT: 0 MTM/ml
SIGNATERA TEST RESULT: NEGATIVE

## 2023-10-29 ENCOUNTER — Inpatient Hospital Stay: Payer: 59 | Attending: Hematology and Oncology

## 2023-10-29 VITALS — BP 103/70 | HR 81 | Temp 98.8°F | Resp 16

## 2023-10-29 DIAGNOSIS — Z5111 Encounter for antineoplastic chemotherapy: Secondary | ICD-10-CM | POA: Diagnosis present

## 2023-10-29 DIAGNOSIS — C778 Secondary and unspecified malignant neoplasm of lymph nodes of multiple regions: Secondary | ICD-10-CM | POA: Diagnosis present

## 2023-10-29 DIAGNOSIS — C50411 Malignant neoplasm of upper-outer quadrant of right female breast: Secondary | ICD-10-CM | POA: Insufficient documentation

## 2023-10-29 MED ORDER — GOSERELIN ACETATE 3.6 MG ~~LOC~~ IMPL
3.6000 mg | DRUG_IMPLANT | Freq: Once | SUBCUTANEOUS | Status: AC
  Administered 2023-10-29: 3.6 mg via SUBCUTANEOUS
  Filled 2023-10-29: qty 3.6

## 2023-11-26 ENCOUNTER — Inpatient Hospital Stay: Payer: 59 | Attending: Hematology and Oncology

## 2023-11-26 VITALS — BP 120/86 | HR 91 | Resp 17

## 2023-11-26 DIAGNOSIS — C50411 Malignant neoplasm of upper-outer quadrant of right female breast: Secondary | ICD-10-CM | POA: Diagnosis present

## 2023-11-26 DIAGNOSIS — C778 Secondary and unspecified malignant neoplasm of lymph nodes of multiple regions: Secondary | ICD-10-CM | POA: Insufficient documentation

## 2023-11-26 DIAGNOSIS — Z5111 Encounter for antineoplastic chemotherapy: Secondary | ICD-10-CM | POA: Insufficient documentation

## 2023-11-26 DIAGNOSIS — Z170 Estrogen receptor positive status [ER+]: Secondary | ICD-10-CM

## 2023-11-26 MED ORDER — GOSERELIN ACETATE 3.6 MG ~~LOC~~ IMPL
3.6000 mg | DRUG_IMPLANT | Freq: Once | SUBCUTANEOUS | Status: AC
  Administered 2023-11-26: 3.6 mg via SUBCUTANEOUS
  Filled 2023-11-26: qty 3.6

## 2023-12-24 ENCOUNTER — Inpatient Hospital Stay: Payer: 59 | Attending: Hematology and Oncology

## 2023-12-24 VITALS — BP 115/75 | HR 88 | Temp 97.8°F | Resp 18

## 2023-12-24 DIAGNOSIS — C778 Secondary and unspecified malignant neoplasm of lymph nodes of multiple regions: Secondary | ICD-10-CM | POA: Insufficient documentation

## 2023-12-24 DIAGNOSIS — C50411 Malignant neoplasm of upper-outer quadrant of right female breast: Secondary | ICD-10-CM | POA: Diagnosis present

## 2023-12-24 DIAGNOSIS — Z5111 Encounter for antineoplastic chemotherapy: Secondary | ICD-10-CM | POA: Insufficient documentation

## 2023-12-24 DIAGNOSIS — Z170 Estrogen receptor positive status [ER+]: Secondary | ICD-10-CM

## 2023-12-24 MED ORDER — GOSERELIN ACETATE 3.6 MG ~~LOC~~ IMPL
3.6000 mg | DRUG_IMPLANT | Freq: Once | SUBCUTANEOUS | Status: AC
  Administered 2023-12-24: 3.6 mg via SUBCUTANEOUS
  Filled 2023-12-24: qty 3.6

## 2023-12-28 ENCOUNTER — Other Ambulatory Visit: Payer: Self-pay | Admitting: Adult Health

## 2023-12-31 ENCOUNTER — Encounter: Payer: Self-pay | Admitting: Adult Health

## 2024-01-01 ENCOUNTER — Other Ambulatory Visit: Payer: Self-pay

## 2024-01-01 DIAGNOSIS — Z170 Estrogen receptor positive status [ER+]: Secondary | ICD-10-CM

## 2024-01-16 ENCOUNTER — Encounter: Payer: Self-pay | Admitting: Hematology and Oncology

## 2024-01-21 ENCOUNTER — Telehealth: Payer: Self-pay | Admitting: Hematology and Oncology

## 2024-01-21 ENCOUNTER — Inpatient Hospital Stay

## 2024-01-21 NOTE — Telephone Encounter (Signed)
 left vm for pt to call back to reschedule missed appt.

## 2024-01-23 ENCOUNTER — Inpatient Hospital Stay

## 2024-01-23 VITALS — BP 122/96 | HR 79 | Temp 98.4°F | Resp 17

## 2024-01-23 DIAGNOSIS — Z5111 Encounter for antineoplastic chemotherapy: Secondary | ICD-10-CM | POA: Diagnosis not present

## 2024-01-23 DIAGNOSIS — Z170 Estrogen receptor positive status [ER+]: Secondary | ICD-10-CM

## 2024-01-23 MED ORDER — GOSERELIN ACETATE 3.6 MG ~~LOC~~ IMPL
3.6000 mg | DRUG_IMPLANT | Freq: Once | SUBCUTANEOUS | Status: AC
  Administered 2024-01-23: 3.6 mg via SUBCUTANEOUS
  Filled 2024-01-23: qty 3.6

## 2024-02-12 ENCOUNTER — Telehealth: Payer: Self-pay

## 2024-02-12 NOTE — Telephone Encounter (Signed)
 Spoke with the patient regarding the completion and faxing of her FMLA forms. Patient demonstrated understanding of the process and requested that her copy be sent via email.

## 2024-02-15 ENCOUNTER — Telehealth: Payer: Self-pay | Admitting: Hematology and Oncology

## 2024-02-15 NOTE — Telephone Encounter (Signed)
 I left voicemail for patient regarding rescheduled injection appointment from 02/18/2024 to 02/21/2024.

## 2024-02-18 ENCOUNTER — Inpatient Hospital Stay

## 2024-02-21 ENCOUNTER — Inpatient Hospital Stay: Attending: Hematology and Oncology

## 2024-02-21 VITALS — BP 114/70 | HR 75 | Temp 98.2°F | Resp 16

## 2024-02-21 DIAGNOSIS — Z5111 Encounter for antineoplastic chemotherapy: Secondary | ICD-10-CM | POA: Diagnosis present

## 2024-02-21 DIAGNOSIS — C50411 Malignant neoplasm of upper-outer quadrant of right female breast: Secondary | ICD-10-CM | POA: Diagnosis present

## 2024-02-21 DIAGNOSIS — Z170 Estrogen receptor positive status [ER+]: Secondary | ICD-10-CM

## 2024-02-21 DIAGNOSIS — C778 Secondary and unspecified malignant neoplasm of lymph nodes of multiple regions: Secondary | ICD-10-CM | POA: Insufficient documentation

## 2024-02-21 MED ORDER — GOSERELIN ACETATE 3.6 MG ~~LOC~~ IMPL
3.6000 mg | DRUG_IMPLANT | Freq: Once | SUBCUTANEOUS | Status: AC
  Administered 2024-02-21: 3.6 mg via SUBCUTANEOUS
  Filled 2024-02-21: qty 3.6

## 2024-03-17 ENCOUNTER — Inpatient Hospital Stay: Attending: Hematology and Oncology

## 2024-04-14 ENCOUNTER — Inpatient Hospital Stay: Attending: Hematology and Oncology

## 2024-05-12 ENCOUNTER — Inpatient Hospital Stay: Attending: Hematology and Oncology

## 2024-06-09 ENCOUNTER — Inpatient Hospital Stay: Attending: Hematology and Oncology

## 2024-07-07 ENCOUNTER — Inpatient Hospital Stay: Attending: Hematology and Oncology

## 2024-08-04 ENCOUNTER — Inpatient Hospital Stay: Attending: Hematology and Oncology | Admitting: Hematology and Oncology

## 2024-08-04 ENCOUNTER — Inpatient Hospital Stay
